# Patient Record
Sex: Female | Born: 1935 | Race: White | Hispanic: No | Smoking: Never smoker
Health system: Southern US, Community
[De-identification: ages and names within clinical notes are randomized; demographics above are authoritative.]

## PROBLEM LIST (undated history)

## (undated) DIAGNOSIS — N189 Chronic kidney disease, unspecified: Secondary | ICD-10-CM

## (undated) DIAGNOSIS — I2699 Other pulmonary embolism without acute cor pulmonale: Secondary | ICD-10-CM

## (undated) DIAGNOSIS — I4891 Unspecified atrial fibrillation: Secondary | ICD-10-CM

## (undated) DIAGNOSIS — E785 Hyperlipidemia, unspecified: Secondary | ICD-10-CM

## (undated) DIAGNOSIS — E119 Type 2 diabetes mellitus without complications: Secondary | ICD-10-CM

## (undated) DIAGNOSIS — I10 Essential (primary) hypertension: Secondary | ICD-10-CM

## (undated) DIAGNOSIS — I82409 Acute embolism and thrombosis of unspecified deep veins of unspecified lower extremity: Secondary | ICD-10-CM

## (undated) DIAGNOSIS — D649 Anemia, unspecified: Secondary | ICD-10-CM

## (undated) HISTORY — DX: Essential (primary) hypertension: I10

## (undated) HISTORY — DX: Type 2 diabetes mellitus without complications: E11.9

## (undated) HISTORY — PX: ABDOMINAL HYSTERECTOMY: SHX81

## (undated) HISTORY — DX: Hyperlipidemia, unspecified: E78.5

## (undated) HISTORY — PX: CHOLECYSTECTOMY: SHX55

## (undated) HISTORY — PX: OTHER SURGICAL HISTORY: SHX169

---

## 2007-10-20 ENCOUNTER — Ambulatory Visit: Payer: Self-pay | Admitting: Cardiology

## 2007-10-23 ENCOUNTER — Ambulatory Visit: Payer: Self-pay | Admitting: Cardiology

## 2007-10-27 ENCOUNTER — Ambulatory Visit: Payer: Self-pay | Admitting: Cardiology

## 2007-11-20 ENCOUNTER — Ambulatory Visit: Payer: Self-pay | Admitting: Cardiology

## 2008-01-12 ENCOUNTER — Encounter (HOSPITAL_COMMUNITY): Admission: RE | Admit: 2008-01-12 | Discharge: 2008-04-11 | Payer: Self-pay | Admitting: Nephrology

## 2008-05-09 ENCOUNTER — Encounter (HOSPITAL_COMMUNITY): Admission: RE | Admit: 2008-05-09 | Discharge: 2008-08-07 | Payer: Self-pay | Admitting: Nephrology

## 2008-08-15 ENCOUNTER — Encounter (HOSPITAL_COMMUNITY): Admission: RE | Admit: 2008-08-15 | Discharge: 2008-09-08 | Payer: Self-pay | Admitting: Nephrology

## 2008-09-09 ENCOUNTER — Encounter (HOSPITAL_COMMUNITY): Admission: RE | Admit: 2008-09-09 | Discharge: 2008-12-08 | Payer: Self-pay | Admitting: Nephrology

## 2008-12-19 ENCOUNTER — Encounter (HOSPITAL_COMMUNITY): Admission: RE | Admit: 2008-12-19 | Discharge: 2009-03-19 | Payer: Self-pay | Admitting: Nephrology

## 2009-03-28 ENCOUNTER — Encounter (HOSPITAL_COMMUNITY): Admission: RE | Admit: 2009-03-28 | Discharge: 2009-06-26 | Payer: Self-pay | Admitting: Nephrology

## 2009-07-06 ENCOUNTER — Encounter (HOSPITAL_COMMUNITY): Admission: RE | Admit: 2009-07-06 | Discharge: 2009-10-04 | Payer: Self-pay | Admitting: Nephrology

## 2009-10-19 ENCOUNTER — Encounter (HOSPITAL_COMMUNITY): Admission: RE | Admit: 2009-10-19 | Discharge: 2010-01-17 | Payer: Self-pay | Admitting: Nephrology

## 2009-12-28 ENCOUNTER — Encounter: Admission: RE | Admit: 2009-12-28 | Discharge: 2010-03-28 | Payer: Self-pay | Admitting: Family Medicine

## 2010-02-01 ENCOUNTER — Encounter (HOSPITAL_COMMUNITY): Admission: RE | Admit: 2010-02-01 | Discharge: 2010-05-02 | Payer: Self-pay | Admitting: Nephrology

## 2010-05-17 ENCOUNTER — Encounter (HOSPITAL_COMMUNITY)
Admission: RE | Admit: 2010-05-17 | Discharge: 2010-08-15 | Payer: Self-pay | Source: Home / Self Care | Admitting: Nephrology

## 2010-08-30 ENCOUNTER — Encounter
Admission: RE | Admit: 2010-08-30 | Discharge: 2010-10-09 | Payer: Self-pay | Source: Home / Self Care | Attending: Nephrology | Admitting: Nephrology

## 2010-09-24 LAB — POCT HEMOGLOBIN-HEMACUE: Hemoglobin: 11.2 g/dL — ABNORMAL LOW (ref 12.0–15.0)

## 2010-10-11 ENCOUNTER — Other Ambulatory Visit: Payer: Self-pay | Admitting: Nephrology

## 2010-10-11 ENCOUNTER — Other Ambulatory Visit: Payer: Self-pay

## 2010-10-11 ENCOUNTER — Ambulatory Visit (HOSPITAL_COMMUNITY): Payer: Medicare Other | Attending: Nephrology

## 2010-10-11 DIAGNOSIS — N184 Chronic kidney disease, stage 4 (severe): Secondary | ICD-10-CM | POA: Insufficient documentation

## 2010-10-11 DIAGNOSIS — D638 Anemia in other chronic diseases classified elsewhere: Secondary | ICD-10-CM | POA: Insufficient documentation

## 2010-10-11 LAB — IRON AND TIBC
Iron: 101 ug/dL (ref 42–135)
Saturation Ratios: 36 % (ref 20–55)
TIBC: 279 ug/dL (ref 250–470)
UIBC: 178 ug/dL

## 2010-10-11 LAB — FERRITIN: Ferritin: 132 ng/mL (ref 10–291)

## 2010-10-12 LAB — POCT HEMOGLOBIN-HEMACUE: Hemoglobin: 12 g/dL (ref 12.0–15.0)

## 2010-11-01 ENCOUNTER — Encounter (HOSPITAL_COMMUNITY)
Admission: RE | Admit: 2010-11-01 | Discharge: 2010-11-01 | Disposition: A | Discharge status: Home / Self Care | Payer: Medicare Other | Source: Ambulatory Visit | Attending: Nephrology | Admitting: Nephrology

## 2010-11-01 ENCOUNTER — Other Ambulatory Visit: Payer: Self-pay

## 2010-11-01 DIAGNOSIS — D638 Anemia in other chronic diseases classified elsewhere: Secondary | ICD-10-CM | POA: Insufficient documentation

## 2010-11-01 DIAGNOSIS — N184 Chronic kidney disease, stage 4 (severe): Secondary | ICD-10-CM | POA: Insufficient documentation

## 2010-11-02 LAB — POCT HEMOGLOBIN-HEMACUE: Hemoglobin: 10.8 g/dL — ABNORMAL LOW (ref 12.0–15.0)

## 2010-11-19 LAB — POCT HEMOGLOBIN-HEMACUE: Hemoglobin: 11.3 g/dL — ABNORMAL LOW (ref 12.0–15.0)

## 2010-11-20 LAB — POCT HEMOGLOBIN-HEMACUE
Hemoglobin: 10.8 g/dL — ABNORMAL LOW (ref 12.0–15.0)
Hemoglobin: 11.3 g/dL — ABNORMAL LOW (ref 12.0–15.0)

## 2010-11-21 LAB — FERRITIN: Ferritin: 132 ng/mL (ref 10–291)

## 2010-11-21 LAB — IRON AND TIBC
Iron: 84 ug/dL (ref 42–135)
Saturation Ratios: 31 % (ref 20–55)
TIBC: 271 ug/dL (ref 250–470)
UIBC: 187 ug/dL

## 2010-11-21 LAB — POCT HEMOGLOBIN-HEMACUE: Hemoglobin: 11.7 g/dL — ABNORMAL LOW (ref 12.0–15.0)

## 2010-11-22 ENCOUNTER — Other Ambulatory Visit: Payer: Self-pay

## 2010-11-22 ENCOUNTER — Encounter (HOSPITAL_COMMUNITY): Payer: Medicare Other | Attending: Nephrology

## 2010-11-22 DIAGNOSIS — D638 Anemia in other chronic diseases classified elsewhere: Secondary | ICD-10-CM | POA: Insufficient documentation

## 2010-11-22 DIAGNOSIS — N184 Chronic kidney disease, stage 4 (severe): Secondary | ICD-10-CM | POA: Insufficient documentation

## 2010-11-22 LAB — BASIC METABOLIC PANEL
BUN: 55 mg/dL — ABNORMAL HIGH (ref 6–23)
CO2: 29 mEq/L (ref 19–32)
Calcium: 9.3 mg/dL (ref 8.4–10.5)
Chloride: 103 mEq/L (ref 96–112)
Creatinine, Ser: 2.86 mg/dL — ABNORMAL HIGH (ref 0.4–1.2)
GFR calc Af Amer: 19 mL/min — ABNORMAL LOW (ref >60)
GFR calc non Af Amer: 16 mL/min — ABNORMAL LOW (ref >60)
Glucose, Bld: 148 mg/dL — ABNORMAL HIGH (ref 70–99)
Potassium: 3.8 mEq/L (ref 3.5–5.1)
Sodium: 139 mEq/L (ref 135–145)

## 2010-11-22 LAB — POCT HEMOGLOBIN-HEMACUE
Hemoglobin: 10.9 g/dL — ABNORMAL LOW (ref 12.0–15.0)
Hemoglobin: 10.9 g/dL — ABNORMAL LOW (ref 12.0–15.0)

## 2010-11-23 LAB — POCT HEMOGLOBIN-HEMACUE
Hemoglobin: 11.4 g/dL — ABNORMAL LOW (ref 12.0–15.0)
Hemoglobin: 10.9 g/dL — ABNORMAL LOW (ref 12.0–15.0)

## 2010-11-24 LAB — POCT HEMOGLOBIN-HEMACUE: Hemoglobin: 11 g/dL — ABNORMAL LOW (ref 12.0–15.0)

## 2010-11-25 LAB — IRON AND TIBC
Iron: 69 ug/dL (ref 42–135)
Iron: 92 ug/dL (ref 42–135)
Saturation Ratios: 30 % (ref 20–55)
Saturation Ratios: 34 % (ref 20–55)
TIBC: 228 ug/dL — ABNORMAL LOW (ref 250–470)
TIBC: 271 ug/dL (ref 250–470)
UIBC: 159 ug/dL
UIBC: 179 ug/dL

## 2010-11-25 LAB — FERRITIN
Ferritin: 115 ng/mL (ref 10–291)
Ferritin: 116 ng/mL (ref 10–291)

## 2010-11-25 LAB — POCT HEMOGLOBIN-HEMACUE
Hemoglobin: 10.7 g/dL — ABNORMAL LOW (ref 12.0–15.0)
Hemoglobin: 11.1 g/dL — ABNORMAL LOW (ref 12.0–15.0)
Hemoglobin: 10.4 g/dL — ABNORMAL LOW (ref 12.0–15.0)

## 2010-11-26 LAB — POCT HEMOGLOBIN-HEMACUE: Hemoglobin: 11.2 g/dL — ABNORMAL LOW (ref 12.0–15.0)

## 2010-11-27 LAB — POCT HEMOGLOBIN-HEMACUE: Hemoglobin: 11.9 g/dL — ABNORMAL LOW (ref 12.0–15.0)

## 2010-11-28 LAB — IRON AND TIBC
Saturation Ratios: 37 % (ref 20–55)
TIBC: 286 ug/dL (ref 250–470)
UIBC: 181 ug/dL

## 2010-11-28 LAB — POCT HEMOGLOBIN-HEMACUE
Hemoglobin: 10.9 g/dL — ABNORMAL LOW (ref 12.0–15.0)
Hemoglobin: 10.5 g/dL — ABNORMAL LOW (ref 12.0–15.0)

## 2010-11-28 LAB — FERRITIN: Ferritin: 172 ng/mL (ref 10–291)

## 2010-12-02 LAB — POCT HEMOGLOBIN-HEMACUE: Hemoglobin: 10.7 g/dL — ABNORMAL LOW (ref 12.0–15.0)

## 2010-12-11 LAB — POCT HEMOGLOBIN-HEMACUE: Hemoglobin: 11 g/dL — ABNORMAL LOW (ref 12.0–15.0)

## 2010-12-12 ENCOUNTER — Encounter (HOSPITAL_COMMUNITY): Payer: Medicare Other | Attending: Nephrology

## 2010-12-12 ENCOUNTER — Other Ambulatory Visit: Payer: Self-pay | Admitting: Nephrology

## 2010-12-12 DIAGNOSIS — D638 Anemia in other chronic diseases classified elsewhere: Secondary | ICD-10-CM | POA: Insufficient documentation

## 2010-12-12 DIAGNOSIS — N184 Chronic kidney disease, stage 4 (severe): Secondary | ICD-10-CM | POA: Insufficient documentation

## 2010-12-13 LAB — POCT HEMOGLOBIN-HEMACUE: Hemoglobin: 11.1 g/dL — ABNORMAL LOW (ref 12.0–15.0)

## 2010-12-13 LAB — IRON AND TIBC
Iron: 138 ug/dL — ABNORMAL HIGH (ref 42–135)
TIBC: 286 ug/dL (ref 250–470)
UIBC: 148 ug/dL

## 2010-12-13 LAB — FERRITIN: Ferritin: 140 ng/mL (ref 10–291)

## 2010-12-15 LAB — POCT HEMOGLOBIN-HEMACUE: Hemoglobin: 12.5 g/dL (ref 12.0–15.0)

## 2010-12-16 LAB — IRON AND TIBC
Iron: 91 ug/dL (ref 42–135)
TIBC: 290 ug/dL (ref 250–470)
UIBC: 199 ug/dL

## 2010-12-16 LAB — FERRITIN: Ferritin: 74 ng/mL (ref 10–291)

## 2010-12-16 LAB — POCT HEMOGLOBIN-HEMACUE: Hemoglobin: 11.5 g/dL — ABNORMAL LOW (ref 12.0–15.0)

## 2010-12-18 LAB — POCT HEMOGLOBIN-HEMACUE
Hemoglobin: 10.8 g/dL — ABNORMAL LOW (ref 12.0–15.0)
Hemoglobin: 12.9 g/dL (ref 12.0–15.0)

## 2010-12-19 LAB — POCT HEMOGLOBIN-HEMACUE: Hemoglobin: 10.7 g/dL — ABNORMAL LOW (ref 12.0–15.0)

## 2010-12-20 LAB — POCT HEMOGLOBIN-HEMACUE
Hemoglobin: 12.1 g/dL (ref 12.0–15.0)
Hemoglobin: 12.1 g/dL (ref 12.0–15.0)

## 2010-12-20 LAB — IRON AND TIBC
Iron: 74 ug/dL (ref 42–135)
Saturation Ratios: 28 % (ref 20–55)

## 2010-12-24 LAB — POCT HEMOGLOBIN-HEMACUE
Hemoglobin: 12.1 g/dL (ref 12.0–15.0)
Hemoglobin: 11.9 g/dL — ABNORMAL LOW (ref 12.0–15.0)

## 2010-12-25 LAB — POCT HEMOGLOBIN-HEMACUE: Hemoglobin: 11.1 g/dL — ABNORMAL LOW (ref 12.0–15.0)

## 2010-12-26 ENCOUNTER — Encounter: Payer: Self-pay | Admitting: Nurse Practitioner

## 2010-12-26 DIAGNOSIS — N189 Chronic kidney disease, unspecified: Secondary | ICD-10-CM

## 2010-12-26 DIAGNOSIS — D649 Anemia, unspecified: Secondary | ICD-10-CM | POA: Insufficient documentation

## 2010-12-26 DIAGNOSIS — M810 Age-related osteoporosis without current pathological fracture: Secondary | ICD-10-CM | POA: Insufficient documentation

## 2010-12-26 DIAGNOSIS — I10 Essential (primary) hypertension: Secondary | ICD-10-CM | POA: Insufficient documentation

## 2010-12-26 DIAGNOSIS — G629 Polyneuropathy, unspecified: Secondary | ICD-10-CM | POA: Insufficient documentation

## 2010-12-26 DIAGNOSIS — E785 Hyperlipidemia, unspecified: Secondary | ICD-10-CM

## 2010-12-26 DIAGNOSIS — I8002 Phlebitis and thrombophlebitis of superficial vessels of left lower extremity: Secondary | ICD-10-CM

## 2010-12-26 DIAGNOSIS — IMO0002 Reserved for concepts with insufficient information to code with codable children: Secondary | ICD-10-CM | POA: Insufficient documentation

## 2010-12-26 DIAGNOSIS — I872 Venous insufficiency (chronic) (peripheral): Secondary | ICD-10-CM | POA: Insufficient documentation

## 2010-12-26 DIAGNOSIS — E119 Type 2 diabetes mellitus without complications: Secondary | ICD-10-CM | POA: Insufficient documentation

## 2010-12-26 DIAGNOSIS — E010 Iodine-deficiency related diffuse (endemic) goiter: Secondary | ICD-10-CM

## 2011-01-02 ENCOUNTER — Other Ambulatory Visit: Payer: Self-pay | Admitting: Nephrology

## 2011-01-02 ENCOUNTER — Encounter (HOSPITAL_COMMUNITY): Payer: Medicare Other

## 2011-01-02 LAB — POCT HEMOGLOBIN-HEMACUE: Hemoglobin: 11.5 g/dL — ABNORMAL LOW (ref 12.0–15.0)

## 2011-01-22 NOTE — Assessment & Plan Note (Signed)
[facility]                            CARDIOLOGY OFFICE NOTE   Julie Carpenter, Julie Carpenter                      MRN:          [id number]  DATE:10/20/2007                            DOB:          [birth year]    CARDIOLOGY CONSULTATION:   Julie Carpenter is here for cardiac evaluation.  She has had two significant  syncopal episodes.  Approximately in September 2008 as she opened her  door to get out of her car, she remembers nothing about the event and  found herself on the ground with a broken bone in her leg.  I do not  know any more specifics about that episode.  Approximately 4 weeks ago  she got out of her car and she had some blurred vision.  She then tried  to rest by leaning over the hood of her car and next found herself on  the ground.  Other than these events she is fully active.  In fact, she  still does some dancing.  She does not have problems with activity.  She  does not have chest pain.  There is no major shortness of breath.  She  does not feel significant palpitations.  With her recent events she has  had an MRI that was done as an outpatient at [facility].  The study was  done on October 05, 2007.  This study was negative.  It was a MR of the  brain for her age.  There have also been two other episodes of syncope.   There is no documented coronary disease.  The patient relates that she  was seen by me and the [facility] office several years ago.  It appeared that  I was involved in her care in 2005.  At the moment the [place] office  cannot find any of these records.  I have found through [facility] that she had a stress Cardiolite in 2005.  At that time her  ejection fraction was normal.  There was a question of slight apical  ischemia but this was not a marked finding.   PAST MEDICAL HISTORY:  Allergies:  PENICILLIN.   Medications:  1. Glipizide 10 mg.  2. Lisinopril 10 mg.  3. Carvedilol 6.25 mg.  4. Actos 45 mg.  5. Furosemide 40 mg.  6. Amitriptyline 50 mg.  7. Vytorin 10/20 mg.  8. Calcium.  9. Aspirin 81 mg.   Other medical problems:  See the list below.   SOCIAL HISTORY:  The patient is widowed.  She is a retired Fut[employer] trader.  She has never smoked.  She says that she walks regularly and does  abdominal crunches and also dances, as I have mentioned.  She has some  arthritis.  She has a history of kidney disease and is followed by Dr.  Briant Cedar.   Otherwise, her review of systems is negative.   FAMILY HISTORY:  Family history reveals that the patient's mother died  at age 78 of a heart attack and father at age 49 of a heart attack.   PHYSICAL EXAM:  Weight today is 172 pounds.  Blood  pressure in the left  arm when starting her exam was 152/91.  We are now checking orthostatic  blood pressures and that data will be added to his note.  Her pulse was  82.  The patient is oriented to person, time and place.  Affect is normal.  HEENT:  No xanthelasma.  She has normal extraocular motion.  There are  no carotid bruits.  There is no jugular venous distention.  LUNGS:  Clear.  The respiratory effort is not labored.  CARDIAC:  An S1 with an S2.  There are no clicks or significant murmurs.  ABDOMEN:  Soft.  There are no masses or bruits.  The patient does have 1+ edema.   EKG reveals normal sinus rhythm with first-degree AV block.  New.   PROBLEMS:  1. History of cholecystectomy and hysterectomy in the past.  2. Diabetes.  3. Chronic kidney disease, which is significant, and she is followed      by Dr. Briant Cedar, who she sees every 3 months.  4. Mild edema that appears to be due to venous insufficiency.  She      says that it goes away at night and comes back during the day when      her feet are down.  5. History of PENICILLIN allergy.  *6.  Recurrent syncope.  The patient's episodes are concerning.  One of  the episodes suggests that she had absolutely no warning.  With the  other episode she had some blurred  vision first.  An MR scan has shown  no marked abnormalities.  It appears that in 2005 there may have been a  slight abnormality on a Cardiolite scan but no marked abnormalities.  At  this point we need to rule out a cardiac basis for these episodes.  We  need to re-document her left ventricular function and valvular function,  and this will be done with a 2-D echo.  She also needs to wear an event  for order to see if we can catch any hint of abnormalities.  I am not  inclined to change her medications at this time.  I have no evidence of  recurrent pulmonary emboli, but this will be kept in mind.  There is no  carotid Doppler data yet.  In this case I believe that this is  appropriate at this time.  I have not arranged for an exercise test.  I  will see her back for early follow-up.  We will also obtain labs done  through [facility] so that we can document if  she has had a TSH and document also what her hemoglobin and renal  function are.  I will see her back for early follow-up.   Her orthostatic blood pressure check is now completed.  With very  careful analysis, the patient's blood pressure remains in the range of  150/90 when lying, sitting and standing and standing for a prolonged  period.  She has no dizziness and there is no marked orthostasis.   See the full discussion above.     Luis Abed, MD, [facility]  Electronically Signed    JDK/MedQ  DD: 10/20/2007  DT: 10/21/2007  Job #: [id number]   cc:   Lindaann Pascal, PA-C

## 2011-01-22 NOTE — Assessment & Plan Note (Signed)
[facility]                          [place] CARDIOLOGY OFFICE NOTE   Julie, Carpenter                      MRN:          [id number]  DATE:11/20/2007                            DOB:          [birth year]    HISTORY:  Julie Carpenter is seen in followup.  I saw her on October 20, 2007.  At that time, she had given a history of 2 syncopal episodes.  I  was quite concerned about this.  However since that time, she has been  quite stable.  She has had no recurring episodes.  We did decide to have  her wear an event recorder.  In fact, she had sinus rhythm for the  entire prolonged period.  She did not have any atrial fibrillation.  There were 3 beats of supraventricular tachycardia.  There were no  prolonged pauses.  There was no ventricular tachycardia.  The patient  also had a 2-D echo.  This study shows that she has a normal ejection  fraction.  The EF is in the range of 55-60%.  There was mild left  ventricular hypertrophy.  There was mild mitral regurgitation.  The  patient also had carotid Dopplers done October 27, 2007.  There was no  significant stenoses.  Today, she is feeling well.  She has not had  chest pain, shortness of breath or syncope.  I have given her all the  information and she is pleased with the results.   ALLERGIES:  PENICILLIN.   MEDICATIONS:  1. Glipizide.  2. Lisinopril.  3. Carvedilol.  4. Actos.  5. Furosemide.  6. Amitriptyline.  7. Vytorin.  8. Calcium.  9. Aspirin.   OTHER MEDICAL PROBLEMS:  See the list below.   REVIEW OF SYSTEMS:  She is feeling well today.   REVIEW OF SYSTEMS:  Otherwise is negative.   PHYSICAL EXAMINATION:  VITAL SIGNS:  Blood pressure today is 140/85 with  a pulse of 73.  GENERAL:  The patient is oriented to person, time and place.  Affect is  normal.  HEENT:  Reveals no xanthelasma.  She has normal extraocular  motion.  NECK:  There are no carotid bruits.  There is no jugular venous  distention.  LUNGS:  Clear.  Respiratory effort is not labored.  CARDIAC:  Reveals S1-S2.  There are no clicks or significant murmurs.  ABDOMEN:  Soft.  EXTREMITIES:  She has trace peripheral edema.   LABORATORY DATA:  Discussed above.   PROBLEMS:  1. History of cholecystectomy and hysterectomy.  2. Diabetes.  3. Chronic kidney disease followed by Dr. Fidela Salisbury.  4. Mild edema that is probably due to venous insufficiency.  5. History of penicillin allergy.  6. *Syncope on 2 occasions.  Her cardiac workup has been negative.      She does not need a follow up exercise test.  I would recommend no      further workup.  Careful watching will be in order.  I will see her      back for cardiology followup in 1 year.     Julie Carpenter.  Julie Ser, MD, [facility]  Electronically Signed    JDK/MedQ  DD: 11/20/2007  DT: 11/21/2007  Job #: [id number]   cc:   Lindaann Pascal, [facility]  Dyke Maes, M.D.

## 2011-01-23 ENCOUNTER — Other Ambulatory Visit: Payer: Self-pay | Admitting: Nephrology

## 2011-01-23 ENCOUNTER — Encounter (HOSPITAL_COMMUNITY): Payer: Medicare Other | Attending: Nephrology

## 2011-01-23 DIAGNOSIS — D638 Anemia in other chronic diseases classified elsewhere: Secondary | ICD-10-CM | POA: Insufficient documentation

## 2011-01-23 DIAGNOSIS — N184 Chronic kidney disease, stage 4 (severe): Secondary | ICD-10-CM | POA: Insufficient documentation

## 2011-02-13 ENCOUNTER — Other Ambulatory Visit: Payer: Self-pay | Admitting: Nephrology

## 2011-02-13 ENCOUNTER — Encounter (HOSPITAL_COMMUNITY): Payer: Medicare Other | Attending: Nephrology

## 2011-02-13 DIAGNOSIS — D638 Anemia in other chronic diseases classified elsewhere: Secondary | ICD-10-CM | POA: Insufficient documentation

## 2011-02-13 DIAGNOSIS — N184 Chronic kidney disease, stage 4 (severe): Secondary | ICD-10-CM | POA: Insufficient documentation

## 2011-02-13 LAB — FERRITIN: Ferritin: 138 ng/mL (ref 10–291)

## 2011-03-06 ENCOUNTER — Other Ambulatory Visit: Payer: Self-pay | Admitting: Nephrology

## 2011-03-06 ENCOUNTER — Encounter (HOSPITAL_COMMUNITY): Payer: Medicare Other

## 2011-03-06 LAB — POCT HEMOGLOBIN-HEMACUE: Hemoglobin: 11.6 g/dL — ABNORMAL LOW (ref 12.0–15.0)

## 2011-03-27 ENCOUNTER — Other Ambulatory Visit: Payer: Self-pay | Admitting: Nephrology

## 2011-03-27 ENCOUNTER — Encounter (HOSPITAL_COMMUNITY): Payer: Medicare Other | Attending: Nephrology

## 2011-03-27 DIAGNOSIS — N184 Chronic kidney disease, stage 4 (severe): Secondary | ICD-10-CM | POA: Insufficient documentation

## 2011-03-27 DIAGNOSIS — D638 Anemia in other chronic diseases classified elsewhere: Secondary | ICD-10-CM | POA: Insufficient documentation

## 2011-04-17 ENCOUNTER — Other Ambulatory Visit: Payer: Self-pay | Admitting: Nephrology

## 2011-04-17 ENCOUNTER — Encounter (HOSPITAL_COMMUNITY): Payer: Medicare Other | Attending: Nephrology

## 2011-04-17 DIAGNOSIS — N184 Chronic kidney disease, stage 4 (severe): Secondary | ICD-10-CM | POA: Insufficient documentation

## 2011-04-17 DIAGNOSIS — D638 Anemia in other chronic diseases classified elsewhere: Secondary | ICD-10-CM | POA: Insufficient documentation

## 2011-04-17 LAB — POCT HEMOGLOBIN-HEMACUE: Hemoglobin: 10.6 g/dL — ABNORMAL LOW (ref 12.0–15.0)

## 2011-05-08 ENCOUNTER — Other Ambulatory Visit: Payer: Self-pay | Admitting: Nephrology

## 2011-05-08 ENCOUNTER — Encounter (HOSPITAL_COMMUNITY): Payer: Medicare Other

## 2011-05-08 LAB — POCT HEMOGLOBIN-HEMACUE: Hemoglobin: 9.7 g/dL — ABNORMAL LOW (ref 12.0–15.0)

## 2011-05-29 ENCOUNTER — Other Ambulatory Visit: Payer: Self-pay | Admitting: Nephrology

## 2011-05-29 ENCOUNTER — Encounter (HOSPITAL_COMMUNITY)
Admission: RE | Admit: 2011-05-29 | Discharge: 2011-05-29 | Disposition: A | Discharge status: Home / Self Care | Payer: Medicare Other | Source: Ambulatory Visit | Attending: Nephrology | Admitting: Nephrology

## 2011-05-29 DIAGNOSIS — D638 Anemia in other chronic diseases classified elsewhere: Secondary | ICD-10-CM | POA: Insufficient documentation

## 2011-05-29 DIAGNOSIS — N184 Chronic kidney disease, stage 4 (severe): Secondary | ICD-10-CM | POA: Insufficient documentation

## 2011-05-29 LAB — IRON AND TIBC
Saturation Ratios: 35 % (ref 20–55)
TIBC: 274 ug/dL (ref 250–470)
UIBC: 178 ug/dL (ref 125–400)

## 2011-05-29 LAB — FERRITIN: Ferritin: 113 ng/mL (ref 10–291)

## 2011-06-10 LAB — POCT HEMOGLOBIN-HEMACUE
Hemoglobin: 9 — ABNORMAL LOW
Hemoglobin: 10.1 — ABNORMAL LOW
Hemoglobin: 8.7 — ABNORMAL LOW

## 2011-06-11 LAB — POCT HEMOGLOBIN-HEMACUE: Hemoglobin: 10.6 — ABNORMAL LOW

## 2011-06-12 LAB — POCT HEMOGLOBIN-HEMACUE: Hemoglobin: 9 — ABNORMAL LOW

## 2011-06-14 LAB — IRON AND TIBC
Saturation Ratios: 33 % (ref 20–55)
TIBC: 283 ug/dL (ref 250–470)
UIBC: 190 ug/dL

## 2011-06-14 LAB — POCT HEMOGLOBIN-HEMACUE: Hemoglobin: 11 g/dL — ABNORMAL LOW (ref 12.0–15.0)

## 2011-06-19 ENCOUNTER — Encounter (HOSPITAL_COMMUNITY)
Admission: RE | Admit: 2011-06-19 | Discharge: 2011-06-19 | Disposition: A | Discharge status: Home / Self Care | Payer: Medicare Other | Source: Ambulatory Visit | Attending: Nephrology | Admitting: Nephrology

## 2011-06-19 ENCOUNTER — Other Ambulatory Visit: Payer: Self-pay | Admitting: Nephrology

## 2011-06-19 DIAGNOSIS — N184 Chronic kidney disease, stage 4 (severe): Secondary | ICD-10-CM | POA: Insufficient documentation

## 2011-06-19 DIAGNOSIS — D638 Anemia in other chronic diseases classified elsewhere: Secondary | ICD-10-CM | POA: Insufficient documentation

## 2011-07-10 ENCOUNTER — Other Ambulatory Visit: Payer: Self-pay | Admitting: Nephrology

## 2011-07-10 ENCOUNTER — Encounter (HOSPITAL_COMMUNITY): Payer: Medicare Other | Attending: Nephrology

## 2011-07-10 ENCOUNTER — Encounter (HOSPITAL_COMMUNITY): Payer: Medicare Other

## 2011-07-10 DIAGNOSIS — D638 Anemia in other chronic diseases classified elsewhere: Secondary | ICD-10-CM | POA: Insufficient documentation

## 2011-07-10 DIAGNOSIS — N184 Chronic kidney disease, stage 4 (severe): Secondary | ICD-10-CM | POA: Insufficient documentation

## 2011-07-29 ENCOUNTER — Other Ambulatory Visit (HOSPITAL_COMMUNITY): Payer: Self-pay | Admitting: *Deleted

## 2011-07-31 ENCOUNTER — Encounter (HOSPITAL_COMMUNITY)
Admission: RE | Admit: 2011-07-31 | Discharge: 2011-07-31 | Disposition: A | Discharge status: Home / Self Care | Payer: Medicare Other | Source: Ambulatory Visit | Attending: Nephrology | Admitting: Nephrology

## 2011-07-31 DIAGNOSIS — D638 Anemia in other chronic diseases classified elsewhere: Secondary | ICD-10-CM | POA: Insufficient documentation

## 2011-07-31 DIAGNOSIS — N184 Chronic kidney disease, stage 4 (severe): Secondary | ICD-10-CM | POA: Insufficient documentation

## 2011-07-31 MED ORDER — EPOETIN ALFA 10000 UNIT/ML IJ SOLN
INTRAMUSCULAR | Status: AC
  Filled 2011-07-31: qty 1

## 2011-07-31 MED ORDER — EPOETIN ALFA 10000 UNIT/ML IJ SOLN
30000.0000 [IU] | INTRAMUSCULAR | Status: AC
  Administered 2011-07-31: 30000 [IU] via SUBCUTANEOUS

## 2011-07-31 MED ORDER — EPOETIN ALFA 20000 UNIT/ML IJ SOLN
INTRAMUSCULAR | Status: AC
  Filled 2011-07-31: qty 1

## 2011-08-09 ENCOUNTER — Other Ambulatory Visit: Payer: Self-pay | Admitting: Nephrology

## 2011-08-20 ENCOUNTER — Other Ambulatory Visit (HOSPITAL_COMMUNITY): Payer: Self-pay | Admitting: *Deleted

## 2011-08-22 ENCOUNTER — Encounter (HOSPITAL_COMMUNITY): Payer: Medicare Other

## 2011-08-26 ENCOUNTER — Encounter (HOSPITAL_COMMUNITY)
Admission: RE | Admit: 2011-08-26 | Discharge: 2011-08-26 | Disposition: A | Discharge status: Home / Self Care | Payer: Medicare Other | Source: Ambulatory Visit | Attending: Nephrology | Admitting: Nephrology

## 2011-08-26 DIAGNOSIS — N184 Chronic kidney disease, stage 4 (severe): Secondary | ICD-10-CM | POA: Insufficient documentation

## 2011-08-26 DIAGNOSIS — D638 Anemia in other chronic diseases classified elsewhere: Secondary | ICD-10-CM | POA: Insufficient documentation

## 2011-08-26 LAB — IRON AND TIBC
Iron: 82 ug/dL (ref 42–135)
TIBC: 274 ug/dL (ref 250–470)
UIBC: 192 ug/dL (ref 125–400)

## 2011-08-26 LAB — FERRITIN: Ferritin: 103 ng/mL (ref 10–291)

## 2011-08-26 MED ORDER — EPOETIN ALFA 10000 UNIT/ML IJ SOLN: 20000.0000 [IU] | INTRAMUSCULAR | Status: DC

## 2011-08-26 MED ORDER — EPOETIN ALFA 20000 UNIT/ML IJ SOLN
INTRAMUSCULAR | Status: AC
  Administered 2011-08-26: 20000 [IU] via SUBCUTANEOUS
  Filled 2011-08-26: qty 1

## 2011-09-12 ENCOUNTER — Encounter (HOSPITAL_COMMUNITY)
Admission: RE | Admit: 2011-09-12 | Discharge: 2011-09-12 | Disposition: A | Discharge status: Home / Self Care | Payer: Medicare Other | Source: Ambulatory Visit | Attending: Nephrology | Admitting: Nephrology

## 2011-09-12 DIAGNOSIS — N184 Chronic kidney disease, stage 4 (severe): Secondary | ICD-10-CM | POA: Insufficient documentation

## 2011-09-12 DIAGNOSIS — D638 Anemia in other chronic diseases classified elsewhere: Secondary | ICD-10-CM | POA: Insufficient documentation

## 2011-09-12 LAB — POCT HEMOGLOBIN-HEMACUE: Hemoglobin: 9.9 g/dL — ABNORMAL LOW (ref 12.0–15.0)

## 2011-09-12 MED ORDER — EPOETIN ALFA 20000 UNIT/ML IJ SOLN
INTRAMUSCULAR | Status: AC
  Administered 2011-09-12: 20000 [IU] via SUBCUTANEOUS
  Filled 2011-09-12: qty 1

## 2011-09-12 MED ORDER — EPOETIN ALFA 10000 UNIT/ML IJ SOLN: 20000.0000 [IU] | INTRAMUSCULAR | Status: DC

## 2011-09-26 ENCOUNTER — Encounter (HOSPITAL_COMMUNITY)
Admission: RE | Admit: 2011-09-26 | Discharge: 2011-09-26 | Disposition: A | Discharge status: Home / Self Care | Payer: Medicare Other | Source: Ambulatory Visit | Attending: Nephrology | Admitting: Nephrology

## 2011-09-26 LAB — FERRITIN: Ferritin: 72 ng/mL (ref 10–291)

## 2011-09-26 LAB — IRON AND TIBC
Iron: 58 ug/dL (ref 42–135)
TIBC: 272 ug/dL (ref 250–470)
UIBC: 214 ug/dL (ref 125–400)

## 2011-09-26 MED ORDER — EPOETIN ALFA 20000 UNIT/ML IJ SOLN
INTRAMUSCULAR | Status: AC
  Administered 2011-09-26: 20000 [IU] via SUBCUTANEOUS
  Filled 2011-09-26: qty 1

## 2011-09-26 MED ORDER — EPOETIN ALFA 10000 UNIT/ML IJ SOLN: 20000.0000 [IU] | INTRAMUSCULAR | Status: DC

## 2011-10-10 ENCOUNTER — Encounter (HOSPITAL_COMMUNITY)
Admission: RE | Admit: 2011-10-10 | Discharge: 2011-10-10 | Disposition: A | Discharge status: Home / Self Care | Payer: Medicare Other | Source: Ambulatory Visit | Attending: Nephrology | Admitting: Nephrology

## 2011-10-10 MED ORDER — EPOETIN ALFA 10000 UNIT/ML IJ SOLN: 20000.0000 [IU] | INTRAMUSCULAR | Status: DC

## 2011-10-10 MED ORDER — EPOETIN ALFA 20000 UNIT/ML IJ SOLN
INTRAMUSCULAR | Status: AC
  Administered 2011-10-10: 20000 [IU] via SUBCUTANEOUS
  Filled 2011-10-10: qty 1

## 2011-10-24 ENCOUNTER — Encounter (HOSPITAL_COMMUNITY)
Admission: RE | Admit: 2011-10-24 | Discharge: 2011-10-24 | Disposition: A | Discharge status: Home / Self Care | Payer: Medicare Other | Source: Ambulatory Visit | Attending: Nephrology | Admitting: Nephrology

## 2011-10-24 DIAGNOSIS — D638 Anemia in other chronic diseases classified elsewhere: Secondary | ICD-10-CM | POA: Insufficient documentation

## 2011-10-24 DIAGNOSIS — N184 Chronic kidney disease, stage 4 (severe): Secondary | ICD-10-CM | POA: Insufficient documentation

## 2011-10-24 LAB — POCT HEMOGLOBIN-HEMACUE: Hemoglobin: 10.6 g/dL — ABNORMAL LOW (ref 12.0–15.0)

## 2011-10-24 MED ORDER — EPOETIN ALFA 10000 UNIT/ML IJ SOLN: 20000.0000 [IU] | INTRAMUSCULAR | Status: DC

## 2011-10-24 MED ORDER — EPOETIN ALFA 20000 UNIT/ML IJ SOLN
INTRAMUSCULAR | Status: AC
  Administered 2011-10-24: 20000 [IU] via SUBCUTANEOUS
  Filled 2011-10-24: qty 1

## 2011-11-07 ENCOUNTER — Encounter (HOSPITAL_COMMUNITY)
Admission: RE | Admit: 2011-11-07 | Discharge: 2011-11-07 | Disposition: A | Discharge status: Home / Self Care | Payer: Medicare Other | Source: Ambulatory Visit | Attending: Nephrology | Admitting: Nephrology

## 2011-11-07 LAB — POCT HEMOGLOBIN-HEMACUE: Hemoglobin: 10.8 g/dL — ABNORMAL LOW (ref 12.0–15.0)

## 2011-11-07 MED ORDER — EPOETIN ALFA 20000 UNIT/ML IJ SOLN
INTRAMUSCULAR | Status: AC
  Administered 2011-11-07: 20000 [IU] via SUBCUTANEOUS
  Filled 2011-11-07: qty 1

## 2011-11-07 MED ORDER — EPOETIN ALFA 10000 UNIT/ML IJ SOLN: 20000.0000 [IU] | INTRAMUSCULAR | Status: DC

## 2011-11-21 ENCOUNTER — Encounter (HOSPITAL_COMMUNITY)
Admission: RE | Admit: 2011-11-21 | Discharge: 2011-11-21 | Disposition: A | Discharge status: Home / Self Care | Payer: Medicare Other | Source: Ambulatory Visit | Attending: Nephrology | Admitting: Nephrology

## 2011-11-21 DIAGNOSIS — N184 Chronic kidney disease, stage 4 (severe): Secondary | ICD-10-CM | POA: Insufficient documentation

## 2011-11-21 DIAGNOSIS — D638 Anemia in other chronic diseases classified elsewhere: Secondary | ICD-10-CM | POA: Insufficient documentation

## 2011-11-21 MED ORDER — EPOETIN ALFA 10000 UNIT/ML IJ SOLN: 20000.0000 [IU] | INTRAMUSCULAR | Status: DC

## 2011-11-21 MED ORDER — EPOETIN ALFA 20000 UNIT/ML IJ SOLN
INTRAMUSCULAR | Status: AC
  Administered 2011-11-21: 20000 [IU] via SUBCUTANEOUS
  Filled 2011-11-21: qty 1

## 2011-12-02 ENCOUNTER — Other Ambulatory Visit (HOSPITAL_COMMUNITY): Payer: Self-pay | Admitting: *Deleted

## 2011-12-05 ENCOUNTER — Encounter (HOSPITAL_COMMUNITY)
Admission: RE | Admit: 2011-12-05 | Discharge: 2011-12-05 | Disposition: A | Discharge status: Home / Self Care | Payer: Medicare Other | Source: Ambulatory Visit | Attending: Nephrology | Admitting: Nephrology

## 2011-12-05 LAB — POCT HEMOGLOBIN-HEMACUE: Hemoglobin: 11.9 g/dL — ABNORMAL LOW (ref 12.0–15.0)

## 2011-12-05 MED ORDER — EPOETIN ALFA 10000 UNIT/ML IJ SOLN: 20000.0000 [IU] | INTRAMUSCULAR | Status: DC

## 2011-12-05 MED ORDER — EPOETIN ALFA 20000 UNIT/ML IJ SOLN
INTRAMUSCULAR | Status: AC
  Administered 2011-12-05: 20000 [IU] via SUBCUTANEOUS
  Filled 2011-12-05: qty 1

## 2011-12-19 ENCOUNTER — Encounter (HOSPITAL_COMMUNITY)
Admission: RE | Admit: 2011-12-19 | Discharge: 2011-12-19 | Disposition: A | Discharge status: Home / Self Care | Payer: Medicare Other | Source: Ambulatory Visit | Attending: Nephrology | Admitting: Nephrology

## 2011-12-19 DIAGNOSIS — N184 Chronic kidney disease, stage 4 (severe): Secondary | ICD-10-CM | POA: Insufficient documentation

## 2011-12-19 DIAGNOSIS — D638 Anemia in other chronic diseases classified elsewhere: Secondary | ICD-10-CM | POA: Insufficient documentation

## 2011-12-19 LAB — IRON AND TIBC
Iron: 76 ug/dL (ref 42–135)
TIBC: 246 ug/dL — ABNORMAL LOW (ref 250–470)

## 2011-12-19 LAB — FERRITIN: Ferritin: 73 ng/mL (ref 10–291)

## 2011-12-19 MED ORDER — EPOETIN ALFA 10000 UNIT/ML IJ SOLN: 20000.0000 [IU] | INTRAMUSCULAR | Status: DC

## 2012-01-02 ENCOUNTER — Encounter (HOSPITAL_COMMUNITY)
Admission: RE | Admit: 2012-01-02 | Discharge: 2012-01-02 | Disposition: A | Discharge status: Home / Self Care | Payer: Medicare Other | Source: Ambulatory Visit | Attending: Nephrology | Admitting: Nephrology

## 2012-01-02 MED ORDER — EPOETIN ALFA 10000 UNIT/ML IJ SOLN: 20000.0000 [IU] | INTRAMUSCULAR | Status: DC

## 2012-01-16 ENCOUNTER — Encounter (HOSPITAL_COMMUNITY)
Admission: RE | Admit: 2012-01-16 | Discharge: 2012-01-16 | Disposition: A | Discharge status: Home / Self Care | Payer: Medicare Other | Source: Ambulatory Visit | Attending: Nephrology | Admitting: Nephrology

## 2012-01-16 DIAGNOSIS — D638 Anemia in other chronic diseases classified elsewhere: Secondary | ICD-10-CM | POA: Insufficient documentation

## 2012-01-16 DIAGNOSIS — N184 Chronic kidney disease, stage 4 (severe): Secondary | ICD-10-CM | POA: Insufficient documentation

## 2012-01-16 LAB — POCT HEMOGLOBIN-HEMACUE: Hemoglobin: 11 g/dL — ABNORMAL LOW (ref 12.0–15.0)

## 2012-01-16 MED ORDER — EPOETIN ALFA 20000 UNIT/ML IJ SOLN
INTRAMUSCULAR | Status: AC
  Administered 2012-01-16: 20000 [IU] via SUBCUTANEOUS
  Filled 2012-01-16: qty 1

## 2012-01-16 MED ORDER — EPOETIN ALFA 10000 UNIT/ML IJ SOLN: 20000.0000 [IU] | INTRAMUSCULAR | Status: DC

## 2012-01-30 ENCOUNTER — Encounter (HOSPITAL_COMMUNITY)
Admission: RE | Admit: 2012-01-30 | Discharge: 2012-01-30 | Disposition: A | Discharge status: Home / Self Care | Payer: Medicare Other | Source: Ambulatory Visit | Attending: Nephrology | Admitting: Nephrology

## 2012-01-30 LAB — POCT HEMOGLOBIN-HEMACUE: Hemoglobin: 11 g/dL — ABNORMAL LOW (ref 12.0–15.0)

## 2012-01-30 MED ORDER — EPOETIN ALFA 20000 UNIT/ML IJ SOLN
INTRAMUSCULAR | Status: AC
  Administered 2012-01-30: 20000 [IU] via SUBCUTANEOUS
  Filled 2012-01-30: qty 1

## 2012-01-30 MED ORDER — EPOETIN ALFA 10000 UNIT/ML IJ SOLN: 20000.0000 [IU] | INTRAMUSCULAR | Status: DC

## 2012-02-13 ENCOUNTER — Encounter (HOSPITAL_COMMUNITY)
Admission: RE | Admit: 2012-02-13 | Discharge: 2012-02-13 | Disposition: A | Discharge status: Home / Self Care | Payer: Medicare Other | Source: Ambulatory Visit | Attending: Nephrology | Admitting: Nephrology

## 2012-02-13 DIAGNOSIS — N184 Chronic kidney disease, stage 4 (severe): Secondary | ICD-10-CM | POA: Insufficient documentation

## 2012-02-13 DIAGNOSIS — D638 Anemia in other chronic diseases classified elsewhere: Secondary | ICD-10-CM | POA: Insufficient documentation

## 2012-02-13 MED ORDER — EPOETIN ALFA 10000 UNIT/ML IJ SOLN: 20000.0000 [IU] | INTRAMUSCULAR | Status: DC

## 2012-02-21 ENCOUNTER — Other Ambulatory Visit (HOSPITAL_COMMUNITY): Payer: Self-pay | Admitting: *Deleted

## 2012-02-27 ENCOUNTER — Encounter (HOSPITAL_COMMUNITY)
Admission: RE | Admit: 2012-02-27 | Discharge: 2012-02-27 | Disposition: A | Discharge status: Home / Self Care | Payer: Medicare Other | Source: Ambulatory Visit | Attending: Nephrology | Admitting: Nephrology

## 2012-02-27 MED ORDER — EPOETIN ALFA 10000 UNIT/ML IJ SOLN: 20000.0000 [IU] | INTRAMUSCULAR | Status: DC

## 2012-02-27 MED ORDER — EPOETIN ALFA 20000 UNIT/ML IJ SOLN
INTRAMUSCULAR | Status: AC
  Administered 2012-02-27: 20000 [IU] via SUBCUTANEOUS
  Filled 2012-02-27: qty 1

## 2012-03-19 ENCOUNTER — Encounter (HOSPITAL_COMMUNITY)
Admission: RE | Admit: 2012-03-19 | Discharge: 2012-03-19 | Disposition: A | Discharge status: Home / Self Care | Payer: Medicare Other | Source: Ambulatory Visit | Attending: Nephrology | Admitting: Nephrology

## 2012-03-19 DIAGNOSIS — N184 Chronic kidney disease, stage 4 (severe): Secondary | ICD-10-CM | POA: Insufficient documentation

## 2012-03-19 DIAGNOSIS — D638 Anemia in other chronic diseases classified elsewhere: Secondary | ICD-10-CM | POA: Insufficient documentation

## 2012-03-19 LAB — IRON AND TIBC
Iron: 74 ug/dL (ref 42–135)
TIBC: 228 ug/dL — ABNORMAL LOW (ref 250–470)

## 2012-03-19 LAB — FERRITIN: Ferritin: 122 ng/mL (ref 10–291)

## 2012-03-19 MED ORDER — EPOETIN ALFA 10000 UNIT/ML IJ SOLN: 20000.0000 [IU] | INTRAMUSCULAR | Status: DC

## 2012-03-19 MED ORDER — EPOETIN ALFA 20000 UNIT/ML IJ SOLN
INTRAMUSCULAR | Status: AC
  Administered 2012-03-19: 20000 [IU] via SUBCUTANEOUS
  Filled 2012-03-19: qty 1

## 2012-04-09 ENCOUNTER — Encounter (HOSPITAL_COMMUNITY)
Admission: RE | Admit: 2012-04-09 | Discharge: 2012-04-09 | Disposition: A | Discharge status: Home / Self Care | Payer: Medicare Other | Source: Ambulatory Visit | Attending: Nephrology | Admitting: Nephrology

## 2012-04-09 DIAGNOSIS — N184 Chronic kidney disease, stage 4 (severe): Secondary | ICD-10-CM | POA: Insufficient documentation

## 2012-04-09 DIAGNOSIS — D638 Anemia in other chronic diseases classified elsewhere: Secondary | ICD-10-CM | POA: Insufficient documentation

## 2012-04-09 MED ORDER — EPOETIN ALFA 10000 UNIT/ML IJ SOLN: 20000.0000 [IU] | INTRAMUSCULAR | Status: DC

## 2012-04-23 ENCOUNTER — Encounter (HOSPITAL_COMMUNITY)
Admission: RE | Admit: 2012-04-23 | Discharge: 2012-04-23 | Disposition: A | Discharge status: Home / Self Care | Payer: Medicare Other | Source: Ambulatory Visit | Attending: Nephrology | Admitting: Nephrology

## 2012-04-23 LAB — POCT HEMOGLOBIN-HEMACUE: Hemoglobin: 11 g/dL — ABNORMAL LOW (ref 12.0–15.0)

## 2012-04-23 MED ORDER — EPOETIN ALFA 20000 UNIT/ML IJ SOLN
INTRAMUSCULAR | Status: AC
  Administered 2012-04-23: 20000 [IU] via SUBCUTANEOUS
  Filled 2012-04-23: qty 1

## 2012-04-23 MED ORDER — EPOETIN ALFA 10000 UNIT/ML IJ SOLN: 20000.0000 [IU] | INTRAMUSCULAR | Status: DC

## 2012-05-14 ENCOUNTER — Encounter (HOSPITAL_COMMUNITY)
Admission: RE | Admit: 2012-05-14 | Discharge: 2012-05-14 | Disposition: A | Discharge status: Home / Self Care | Payer: Medicare Other | Source: Ambulatory Visit | Attending: Nephrology | Admitting: Nephrology

## 2012-05-14 DIAGNOSIS — N184 Chronic kidney disease, stage 4 (severe): Secondary | ICD-10-CM | POA: Insufficient documentation

## 2012-05-14 DIAGNOSIS — D638 Anemia in other chronic diseases classified elsewhere: Secondary | ICD-10-CM | POA: Insufficient documentation

## 2012-05-14 MED ORDER — EPOETIN ALFA 20000 UNIT/ML IJ SOLN
INTRAMUSCULAR | Status: AC
  Administered 2012-05-14: 20000 [IU] via SUBCUTANEOUS
  Filled 2012-05-14: qty 1

## 2012-05-14 MED ORDER — EPOETIN ALFA 10000 UNIT/ML IJ SOLN: 20000.0000 [IU] | INTRAMUSCULAR | Status: DC

## 2012-06-04 ENCOUNTER — Other Ambulatory Visit (HOSPITAL_COMMUNITY): Payer: Self-pay | Admitting: *Deleted

## 2012-06-04 ENCOUNTER — Encounter (HOSPITAL_COMMUNITY)
Admission: RE | Admit: 2012-06-04 | Discharge: 2012-06-04 | Disposition: A | Discharge status: Home / Self Care | Payer: Medicare Other | Source: Ambulatory Visit | Attending: Nephrology | Admitting: Nephrology

## 2012-06-04 LAB — POCT HEMOGLOBIN-HEMACUE: Hemoglobin: 11.7 g/dL — ABNORMAL LOW (ref 12.0–15.0)

## 2012-06-04 MED ORDER — EPOETIN ALFA 10000 UNIT/ML IJ SOLN: 20000.0000 [IU] | INTRAMUSCULAR | Status: DC

## 2012-06-04 MED ORDER — EPOETIN ALFA 20000 UNIT/ML IJ SOLN
INTRAMUSCULAR | Status: AC
  Administered 2012-06-04: 20000 [IU] via SUBCUTANEOUS
  Filled 2012-06-04: qty 1

## 2012-06-25 ENCOUNTER — Encounter (HOSPITAL_COMMUNITY)
Admission: RE | Admit: 2012-06-25 | Discharge: 2012-06-25 | Disposition: A | Discharge status: Home / Self Care | Payer: Medicare Other | Source: Ambulatory Visit | Attending: Nephrology | Admitting: Nephrology

## 2012-06-25 DIAGNOSIS — N184 Chronic kidney disease, stage 4 (severe): Secondary | ICD-10-CM | POA: Insufficient documentation

## 2012-06-25 DIAGNOSIS — D638 Anemia in other chronic diseases classified elsewhere: Secondary | ICD-10-CM | POA: Insufficient documentation

## 2012-06-25 LAB — POCT HEMOGLOBIN-HEMACUE: Hemoglobin: 11.7 g/dL — ABNORMAL LOW (ref 12.0–15.0)

## 2012-06-25 MED ORDER — EPOETIN ALFA 20000 UNIT/ML IJ SOLN
INTRAMUSCULAR | Status: AC
  Administered 2012-06-25: 20000 [IU]
  Filled 2012-06-25: qty 1

## 2012-06-25 MED ORDER — EPOETIN ALFA 10000 UNIT/ML IJ SOLN: 20000.0000 [IU] | INTRAMUSCULAR | Status: DC

## 2012-07-16 ENCOUNTER — Encounter (HOSPITAL_COMMUNITY)
Admission: RE | Admit: 2012-07-16 | Discharge: 2012-07-16 | Disposition: A | Discharge status: Home / Self Care | Payer: Medicare Other | Source: Ambulatory Visit | Attending: Nephrology | Admitting: Nephrology

## 2012-07-16 DIAGNOSIS — N184 Chronic kidney disease, stage 4 (severe): Secondary | ICD-10-CM | POA: Insufficient documentation

## 2012-07-16 DIAGNOSIS — D638 Anemia in other chronic diseases classified elsewhere: Secondary | ICD-10-CM | POA: Insufficient documentation

## 2012-07-16 MED ORDER — EPOETIN ALFA 10000 UNIT/ML IJ SOLN: 20000.0000 [IU] | INTRAMUSCULAR | Status: DC

## 2012-07-16 MED ORDER — EPOETIN ALFA 20000 UNIT/ML IJ SOLN
INTRAMUSCULAR | Status: AC
  Administered 2012-07-16: 20000 [IU] via SUBCUTANEOUS
  Filled 2012-07-16: qty 1

## 2012-08-05 ENCOUNTER — Encounter (HOSPITAL_COMMUNITY)
Admission: RE | Admit: 2012-08-05 | Discharge: 2012-08-05 | Disposition: A | Discharge status: Home / Self Care | Payer: Medicare Other | Source: Ambulatory Visit | Attending: Nephrology | Admitting: Nephrology

## 2012-08-05 MED ORDER — EPOETIN ALFA 10000 UNIT/ML IJ SOLN: 20000.0000 [IU] | INTRAMUSCULAR | Status: DC

## 2012-08-05 MED ORDER — EPOETIN ALFA 20000 UNIT/ML IJ SOLN
INTRAMUSCULAR | Status: AC
  Administered 2012-08-05: 20000 [IU] via SUBCUTANEOUS
  Filled 2012-08-05: qty 1

## 2012-08-25 ENCOUNTER — Other Ambulatory Visit (HOSPITAL_COMMUNITY): Payer: Self-pay | Admitting: *Deleted

## 2012-08-26 ENCOUNTER — Encounter (HOSPITAL_COMMUNITY): Payer: Medicare Other

## 2012-09-03 ENCOUNTER — Encounter (HOSPITAL_COMMUNITY)
Admission: RE | Admit: 2012-09-03 | Discharge: 2012-09-03 | Disposition: A | Discharge status: Home / Self Care | Payer: Medicare Other | Source: Ambulatory Visit | Attending: Nephrology | Admitting: Nephrology

## 2012-09-03 DIAGNOSIS — D638 Anemia in other chronic diseases classified elsewhere: Secondary | ICD-10-CM | POA: Insufficient documentation

## 2012-09-03 DIAGNOSIS — N184 Chronic kidney disease, stage 4 (severe): Secondary | ICD-10-CM | POA: Insufficient documentation

## 2012-09-03 LAB — FERRITIN: Ferritin: 105 ng/mL (ref 10–291)

## 2012-09-03 LAB — IRON AND TIBC
Iron: 91 ug/dL (ref 42–135)
TIBC: 226 ug/dL — ABNORMAL LOW (ref 250–470)
UIBC: 135 ug/dL (ref 125–400)

## 2012-09-03 LAB — POCT HEMOGLOBIN-HEMACUE: Hemoglobin: 11.6 g/dL — ABNORMAL LOW (ref 12.0–15.0)

## 2012-09-03 MED ORDER — EPOETIN ALFA 20000 UNIT/ML IJ SOLN
INTRAMUSCULAR | Status: AC
  Administered 2012-09-03: 20000 [IU]
  Filled 2012-09-03: qty 1

## 2012-09-03 MED ORDER — EPOETIN ALFA 10000 UNIT/ML IJ SOLN: 20000.0000 [IU] | INTRAMUSCULAR | Status: DC

## 2012-09-23 ENCOUNTER — Encounter (HOSPITAL_COMMUNITY)
Admission: RE | Admit: 2012-09-23 | Discharge: 2012-09-23 | Disposition: A | Discharge status: Home / Self Care | Payer: Medicare Other | Source: Ambulatory Visit | Attending: Nephrology | Admitting: Nephrology

## 2012-09-23 DIAGNOSIS — D638 Anemia in other chronic diseases classified elsewhere: Secondary | ICD-10-CM | POA: Insufficient documentation

## 2012-09-23 DIAGNOSIS — N184 Chronic kidney disease, stage 4 (severe): Secondary | ICD-10-CM | POA: Insufficient documentation

## 2012-09-23 MED ORDER — EPOETIN ALFA 10000 UNIT/ML IJ SOLN: 20000.0000 [IU] | INTRAMUSCULAR | Status: DC

## 2012-09-23 MED ORDER — EPOETIN ALFA 20000 UNIT/ML IJ SOLN
INTRAMUSCULAR | Status: AC
  Administered 2012-09-23: 20000 [IU] via SUBCUTANEOUS
  Filled 2012-09-23: qty 1

## 2012-10-14 ENCOUNTER — Encounter (HOSPITAL_COMMUNITY)
Admission: RE | Admit: 2012-10-14 | Discharge: 2012-10-14 | Disposition: A | Discharge status: Home / Self Care | Payer: Medicare Other | Source: Ambulatory Visit | Attending: Nephrology | Admitting: Nephrology

## 2012-10-14 DIAGNOSIS — N184 Chronic kidney disease, stage 4 (severe): Secondary | ICD-10-CM | POA: Insufficient documentation

## 2012-10-14 DIAGNOSIS — D638 Anemia in other chronic diseases classified elsewhere: Secondary | ICD-10-CM | POA: Insufficient documentation

## 2012-10-14 MED ORDER — EPOETIN ALFA 10000 UNIT/ML IJ SOLN: 20000.0000 [IU] | INTRAMUSCULAR | Status: DC

## 2012-10-14 MED ORDER — EPOETIN ALFA 20000 UNIT/ML IJ SOLN
INTRAMUSCULAR | Status: AC
  Administered 2012-10-14: 20000 [IU] via SUBCUTANEOUS
  Filled 2012-10-14: qty 1

## 2012-11-04 ENCOUNTER — Encounter (HOSPITAL_COMMUNITY)
Admission: RE | Admit: 2012-11-04 | Discharge: 2012-11-04 | Disposition: A | Discharge status: Home / Self Care | Payer: Medicare Other | Source: Ambulatory Visit | Attending: Nephrology | Admitting: Nephrology

## 2012-11-04 LAB — IRON AND TIBC
Iron: 82 ug/dL (ref 42–135)
Saturation Ratios: 32 % (ref 20–55)
TIBC: 254 ug/dL (ref 250–470)
UIBC: 172 ug/dL (ref 125–400)

## 2012-11-04 LAB — FERRITIN: Ferritin: 121 ng/mL (ref 10–291)

## 2012-11-04 MED ORDER — EPOETIN ALFA 10000 UNIT/ML IJ SOLN: 20000.0000 [IU] | INTRAMUSCULAR | Status: DC

## 2012-11-23 ENCOUNTER — Other Ambulatory Visit: Payer: Self-pay | Admitting: Nurse Practitioner

## 2012-11-23 LAB — COMPLETE METABOLIC PANEL WITH GFR
Alkaline Phosphatase: 118 U/L — ABNORMAL HIGH (ref 39–117)
BUN: 28 mg/dL — ABNORMAL HIGH (ref 6–23)
CO2: 28 mEq/L (ref 19–32)
Creat: 1.82 mg/dL — ABNORMAL HIGH (ref 0.50–1.10)
GFR, Est African American: 29 mL/min — ABNORMAL LOW
GFR, Est Non African American: 25 mL/min — ABNORMAL LOW
Glucose, Bld: 128 mg/dL — ABNORMAL HIGH (ref 70–99)
Sodium: 142 mEq/L (ref 135–145)
Total Bilirubin: 0.5 mg/dL (ref 0.3–1.2)

## 2012-11-24 ENCOUNTER — Other Ambulatory Visit (HOSPITAL_COMMUNITY): Payer: Self-pay | Admitting: *Deleted

## 2012-11-24 LAB — VITAMIN D 25 HYDROXY (VIT D DEFICIENCY, FRACTURES): Vit D, 25-Hydroxy: 34 ng/mL (ref 30–89)

## 2012-11-25 ENCOUNTER — Encounter (HOSPITAL_COMMUNITY)
Admission: RE | Admit: 2012-11-25 | Discharge: 2012-11-25 | Disposition: A | Discharge status: Home / Self Care | Payer: Medicare Other | Source: Ambulatory Visit | Attending: Nephrology | Admitting: Nephrology

## 2012-11-25 ENCOUNTER — Encounter (HOSPITAL_COMMUNITY): Payer: Medicare Other

## 2012-11-25 DIAGNOSIS — D638 Anemia in other chronic diseases classified elsewhere: Secondary | ICD-10-CM | POA: Insufficient documentation

## 2012-11-25 DIAGNOSIS — N184 Chronic kidney disease, stage 4 (severe): Secondary | ICD-10-CM | POA: Insufficient documentation

## 2012-11-25 MED ORDER — EPOETIN ALFA 10000 UNIT/ML IJ SOLN: 20000.0000 [IU] | INTRAMUSCULAR | Status: DC

## 2012-11-25 MED ORDER — EPOETIN ALFA 20000 UNIT/ML IJ SOLN
INTRAMUSCULAR | Status: AC
  Administered 2012-11-25: 20000 [IU] via SUBCUTANEOUS
  Filled 2012-11-25: qty 1

## 2012-11-26 ENCOUNTER — Encounter: Payer: Self-pay | Admitting: *Deleted

## 2012-11-26 LAB — NMR LIPOPROFILE WITH LIPIDS
HDL Size: 8.4 nm — ABNORMAL LOW (ref >=9.2)
HDL-C: 32 mg/dL — ABNORMAL LOW (ref >=40)
LDL (calc): 77 mg/dL (ref <100)
LDL Particle Number: 1058 nmol/L — ABNORMAL HIGH (ref <1000)
LDL Size: 20.7 nm (ref >20.5)
Triglycerides: 164 mg/dL — ABNORMAL HIGH (ref <150)
VLDL Size: 43.8 nm (ref >=46.6)

## 2012-11-27 ENCOUNTER — Encounter: Payer: Self-pay | Admitting: *Deleted

## 2012-12-01 ENCOUNTER — Other Ambulatory Visit: Payer: Self-pay

## 2012-12-01 MED ORDER — FUROSEMIDE 40 MG PO TABS: 40.0000 mg | ORAL_TABLET | Freq: Two times a day (BID) | ORAL | Status: DC

## 2012-12-16 ENCOUNTER — Encounter (HOSPITAL_COMMUNITY)
Admission: RE | Admit: 2012-12-16 | Discharge: 2012-12-16 | Disposition: A | Discharge status: Home / Self Care | Payer: Medicare Other | Source: Ambulatory Visit | Attending: Nephrology | Admitting: Nephrology

## 2012-12-16 DIAGNOSIS — N184 Chronic kidney disease, stage 4 (severe): Secondary | ICD-10-CM | POA: Insufficient documentation

## 2012-12-16 DIAGNOSIS — D638 Anemia in other chronic diseases classified elsewhere: Secondary | ICD-10-CM | POA: Insufficient documentation

## 2012-12-16 MED ORDER — EPOETIN ALFA 10000 UNIT/ML IJ SOLN: 20000.0000 [IU] | INTRAMUSCULAR | Status: DC

## 2012-12-16 MED ORDER — EPOETIN ALFA 20000 UNIT/ML IJ SOLN
INTRAMUSCULAR | Status: AC
  Administered 2012-12-16: 20000 [IU] via SUBCUTANEOUS
  Filled 2012-12-16: qty 1

## 2013-01-06 ENCOUNTER — Encounter (HOSPITAL_COMMUNITY)
Admission: RE | Admit: 2013-01-06 | Discharge: 2013-01-06 | Disposition: A | Discharge status: Home / Self Care | Payer: Medicare Other | Source: Ambulatory Visit | Attending: Nephrology | Admitting: Nephrology

## 2013-01-06 LAB — FERRITIN: Ferritin: 90 ng/mL (ref 10–291)

## 2013-01-06 LAB — POCT HEMOGLOBIN-HEMACUE: Hemoglobin: 11.1 g/dL — ABNORMAL LOW (ref 12.0–15.0)

## 2013-01-06 MED ORDER — EPOETIN ALFA 10000 UNIT/ML IJ SOLN: 20000.0000 [IU] | INTRAMUSCULAR | Status: DC

## 2013-01-06 MED ORDER — EPOETIN ALFA 20000 UNIT/ML IJ SOLN
INTRAMUSCULAR | Status: AC
  Administered 2013-01-06: 20000 [IU] via SUBCUTANEOUS
  Filled 2013-01-06: qty 1

## 2013-01-21 ENCOUNTER — Other Ambulatory Visit: Payer: Self-pay

## 2013-01-21 MED ORDER — AMITRIPTYLINE HCL 50 MG PO TABS: 50.0000 mg | ORAL_TABLET | Freq: Every day | ORAL | Status: DC

## 2013-01-21 MED ORDER — CARVEDILOL 12.5 MG PO TABS: ORAL_TABLET | ORAL | Status: DC

## 2013-01-21 NOTE — Telephone Encounter (Signed)
In patient  s chart Coreg is crossed out   Last seen 3/14

## 2013-01-27 ENCOUNTER — Encounter (HOSPITAL_COMMUNITY)
Admission: RE | Admit: 2013-01-27 | Discharge: 2013-01-27 | Disposition: A | Discharge status: Home / Self Care | Payer: Medicare Other | Source: Ambulatory Visit | Attending: Nephrology | Admitting: Nephrology

## 2013-01-27 DIAGNOSIS — N184 Chronic kidney disease, stage 4 (severe): Secondary | ICD-10-CM | POA: Insufficient documentation

## 2013-01-27 DIAGNOSIS — D638 Anemia in other chronic diseases classified elsewhere: Secondary | ICD-10-CM | POA: Insufficient documentation

## 2013-01-27 MED ORDER — EPOETIN ALFA 10000 UNIT/ML IJ SOLN: 20000.0000 [IU] | INTRAMUSCULAR | Status: DC

## 2013-01-27 MED ORDER — FERUMOXYTOL INJECTION 510 MG/17 ML
INTRAVENOUS | Status: AC
  Filled 2013-01-27: qty 17

## 2013-01-27 MED ORDER — EPOETIN ALFA 20000 UNIT/ML IJ SOLN
INTRAMUSCULAR | Status: AC
  Administered 2013-01-27: 20000 [IU] via SUBCUTANEOUS
  Filled 2013-01-27: qty 1

## 2013-02-16 ENCOUNTER — Other Ambulatory Visit (HOSPITAL_COMMUNITY): Payer: Self-pay | Admitting: *Deleted

## 2013-02-17 ENCOUNTER — Encounter (HOSPITAL_COMMUNITY)
Admission: RE | Admit: 2013-02-17 | Discharge: 2013-02-17 | Disposition: A | Discharge status: Home / Self Care | Payer: Medicare Other | Source: Ambulatory Visit | Attending: Nephrology | Admitting: Nephrology

## 2013-02-17 DIAGNOSIS — D638 Anemia in other chronic diseases classified elsewhere: Secondary | ICD-10-CM | POA: Insufficient documentation

## 2013-02-17 DIAGNOSIS — N184 Chronic kidney disease, stage 4 (severe): Secondary | ICD-10-CM | POA: Insufficient documentation

## 2013-02-17 MED ORDER — EPOETIN ALFA 20000 UNIT/ML IJ SOLN
INTRAMUSCULAR | Status: AC
  Administered 2013-02-17: 20000 [IU] via SUBCUTANEOUS
  Filled 2013-02-17: qty 1

## 2013-02-17 MED ORDER — EPOETIN ALFA 10000 UNIT/ML IJ SOLN: 20000.0000 [IU] | INTRAMUSCULAR | Status: DC

## 2013-02-24 ENCOUNTER — Encounter: Payer: Self-pay | Admitting: Nurse Practitioner

## 2013-02-24 ENCOUNTER — Ambulatory Visit (INDEPENDENT_AMBULATORY_CARE_PROVIDER_SITE_OTHER): Payer: Medicare Other | Admitting: Nurse Practitioner

## 2013-02-24 VITALS — BP 105/59 | HR 93 | Temp 97.8°F | Ht 64.0 in | Wt 150.0 lb

## 2013-02-24 DIAGNOSIS — I10 Essential (primary) hypertension: Secondary | ICD-10-CM

## 2013-02-24 DIAGNOSIS — I4891 Unspecified atrial fibrillation: Secondary | ICD-10-CM

## 2013-02-24 DIAGNOSIS — E119 Type 2 diabetes mellitus without complications: Secondary | ICD-10-CM

## 2013-02-24 DIAGNOSIS — M549 Dorsalgia, unspecified: Secondary | ICD-10-CM

## 2013-02-24 DIAGNOSIS — I499 Cardiac arrhythmia, unspecified: Secondary | ICD-10-CM

## 2013-02-24 DIAGNOSIS — E785 Hyperlipidemia, unspecified: Secondary | ICD-10-CM

## 2013-02-24 DIAGNOSIS — E559 Vitamin D deficiency, unspecified: Secondary | ICD-10-CM

## 2013-02-24 LAB — COMPLETE METABOLIC PANEL WITH GFR
ALT: 9 U/L (ref 0–35)
BUN: 42 mg/dL — ABNORMAL HIGH (ref 6–23)
CO2: 26 mEq/L (ref 19–32)
Calcium: 9.8 mg/dL (ref 8.4–10.5)
Chloride: 106 mEq/L (ref 96–112)
Creat: 2.1 mg/dL — ABNORMAL HIGH (ref 0.50–1.10)
GFR, Est African American: 24 mL/min — ABNORMAL LOW

## 2013-02-24 MED ORDER — FUROSEMIDE 40 MG PO TABS: 40.0000 mg | ORAL_TABLET | Freq: Two times a day (BID) | ORAL | Status: DC

## 2013-02-24 MED ORDER — CARVEDILOL 12.5 MG PO TABS: 6.2500 mg | ORAL_TABLET | Freq: Every day | ORAL | Status: DC

## 2013-02-24 MED ORDER — AMITRIPTYLINE HCL 50 MG PO TABS: 50.0000 mg | ORAL_TABLET | Freq: Every day | ORAL | Status: DC

## 2013-02-24 MED ORDER — GLIMEPIRIDE 2 MG PO TABS: 2.0000 mg | ORAL_TABLET | Freq: Every day | ORAL | Status: DC

## 2013-02-24 MED ORDER — RIVAROXABAN 15 MG PO TABS: 15.0000 mg | ORAL_TABLET | Freq: Every day | ORAL | Status: DC

## 2013-02-24 MED ORDER — ATORVASTATIN CALCIUM 40 MG PO TABS: 40.0000 mg | ORAL_TABLET | Freq: Every day | ORAL | Status: DC

## 2013-02-24 NOTE — Patient Instructions (Signed)
Atrial Fibrillation Your caregiver has diagnosed you with atrial fibrillation (AFib). The heart normally beats very regularly; AFib is a type of irregular heartbeat. The heart rate may be faster or slower than normal. This can prevent your heart from pumping as well as it should. AFib can be constant (chronic) or intermittent (paroxysmal). CAUSES  Atrial fibrillation may be caused by:  Heart disease, including heart attack, coronary artery disease, heart failure, diseases of the heart valves, and others.  Blood clot in the lungs (pulmonary embolism).  Pneumonia or other infections.  Chronic lung disease.  Thyroid disease.  Toxins. These include alcohol, some medications (such as decongestant medications or diet pills), and caffeine. In some people, no cause for AFib can be found. This is referred to as Lone Atrial Fibrillation. SYMPTOMS   Palpitations or a fluttering in your chest.  A vague sense of chest discomfort.  Shortness of breath.  Sudden onset of lightheadedness or weakness. Sometimes, the first sign of AFib can be a complication of the condition. This could be a stroke or heart failure. DIAGNOSIS  Your description of your condition may make your caregiver suspicious of atrial fibrillation. Your caregiver will examine your pulse to determine if fibrillation is present. An EKG (electrocardiogram) will confirm the diagnosis. Further testing may help determine what caused you to have atrial fibrillation. This may include chest x-ray, echocardiogram, blood tests, or CT scans. PREVENTION  If you have previously had atrial fibrillation, your caregiver may advise you to avoid substances known to cause the condition (such as stimulant medications, and possibly caffeine or alcohol). You may be advised to use medications to prevent recurrence. Proper treatment of any underlying condition is important to help prevent recurrence. PROGNOSIS  Atrial fibrillation does tend to become a  chronic condition over time. It can cause significant complications (see below). Atrial fibrillation is not usually immediately life-threatening, but it can shorten your life expectancy. This seems to be worse in women. If you have lone atrial fibrillation and are under 60 years old, the risk of complications is very low, and life expectancy is not shortened. RISKS AND COMPLICATIONS  Complications of atrial fibrillation can include stroke, chest pain, and heart failure. Your caregiver will recommend treatments for the atrial fibrillation, as well as for any underlying conditions, to help minimize risk of complications. TREATMENT  Treatment for AFib is divided into several categories:  Treatment of any underlying condition.  Converting you out of AFib into a regular (sinus) rhythm.  Controlling rapid heart rate.  Prevention of blood clots and stroke. Medications and procedures are available to convert your atrial fibrillation to sinus rhythm. However, recent studies have shown that this may not offer you any advantage, and cardiac experts are continuing research and debate on this topic. More important is controlling your rapid heartbeat. The rapid heartbeat causes more symptoms, and places strain on your heart. Your caregiver will advise you on the use of medications that can control your heart rate. Atrial fibrillation is a strong stroke risk. You can lessen this risk by taking blood thinning medications such as Coumadin (warfarin), or sometimes aspirin. These medications need close monitoring by your caregiver. Over-medication can cause bleeding. Too little medication may not protect against stroke. HOME CARE INSTRUCTIONS   If your caregiver prescribed medicine to make your heartbeat more normally, take as directed.  If blood thinners were prescribed by your caregiver, take EXACTLY as directed.  Perform blood tests EXACTLY as directed.  Quit smoking. Smoking increases your cardiac and   lung  (pulmonary) risks.  DO NOT drink alcohol.  DO NOT drink caffeinated drinks (e.g. coffee, soda, chocolate, and leaf teas). You may drink decaffeinated coffee, soda or tea.  If you are overweight, you should choose a reduced calorie diet to lose weight. Please see a registered dietitian if you need more information about healthy weight loss. DO NOT USE DIET PILLS as they may aggravate heart problems.  If you have other heart problems that are causing AFib, you may need to eat a low salt, fat, and cholesterol diet. Your caregiver will tell you if this is necessary.  Exercise every day to improve your physical fitness. Stay active unless advised otherwise.  If your caregiver has given you a follow-up appointment, it is very important to keep that appointment. Not keeping the appointment could result in heart failure or stroke. If there is any problem keeping the appointment, you must call back to this facility for assistance. SEEK MEDICAL CARE IF:  You notice a change in the rate, rhythm or strength of your heartbeat.  You develop an infection or any other change in your overall health status. SEEK IMMEDIATE MEDICAL CARE IF:   You develop chest pain, abdominal pain, sweating, weakness or feel sick to your stomach (nausea).  You develop shortness of breath.  You develop swollen feet and ankles.  You develop dizziness, numbness, or weakness of your face or limbs, or any change in vision or speech. MAKE SURE YOU:   Understand these instructions.  Will watch your condition.  Will get help right away if you are not doing well or get worse. Document Released: 08/26/2005 Document Revised: 11/18/2011 Document Reviewed: 04/04/2010 [employer] Patient Information 2014 [employer].  

## 2013-02-24 NOTE — Progress Notes (Signed)
Subjective:    Patient ID: Julie Carpenter, female    DOB: 02-06-1926, 79 y.o.   MRN: [id number]  Diabetes She presents for her follow-up diabetic visit. She has type 2 diabetes mellitus. No MedicAlert identification noted. The initial diagnosis of diabetes was made 25 years ago. Her disease course has been stable. There are no hypoglycemic associated symptoms. Pertinent negatives for diabetes include no chest pain, no foot paresthesias, no foot ulcerations, no polydipsia, no polyphagia, no polyuria, no visual change, no weakness and no weight loss. There are no hypoglycemic complications. Symptoms are stable. There are no diabetic complications. Risk factors for coronary artery disease include dyslipidemia, hypertension and post-menopausal. Current diabetic treatment includes diet and oral agent (monotherapy). She is compliant with treatment all of the time. Her weight is stable. She is following a diabetic diet. When asked about meal planning, she reported none. She has not had a previous visit with a dietician. She rarely participates in exercise. There is no change in her home blood glucose trend. Her breakfast blood glucose is taken between 7-8 am. Her breakfast blood glucose range is generally 110-130 mg/dl. Her dinner blood glucose range is generally 140-180 mg/dl. Her highest blood glucose is 110-130 mg/dl. An ACE inhibitor/angiotensin II receptor blocker is being taken. She does not see a podiatrist.Eye exam is current (september 2013).  Hypertension This is a chronic problem. The current episode started more than 1 year ago. The problem is controlled. Pertinent negatives include no chest pain, neck pain, palpitations, peripheral edema or shortness of breath. There are no associated agents to hypertension. Risk factors for coronary artery disease include diabetes mellitus, dyslipidemia, post-menopausal state and sedentary lifestyle. Past treatments include angiotensin blockers, calcium channel  blockers and diuretics. The current treatment provides significant improvement. There are no compliance problems.   Hyperlipidemia This is a chronic problem. The current episode started more than 1 year ago. The problem is controlled. Recent lipid tests were reviewed and are normal. Exacerbating diseases include diabetes. Pertinent negatives include no chest pain or shortness of breath. Current antihyperlipidemic treatment includes statins. The current treatment provides moderate improvement of lipids. Compliance problems include adherence to diet.  Risk factors for coronary artery disease include diabetes mellitus, hypertension, post-menopausal and a sedentary lifestyle.  Back pain amitruptyline- only takes at night- helps a little   Review of Systems  Constitutional: Negative for weight loss.  HENT: Negative for neck pain.   Respiratory: Negative for shortness of breath.   Cardiovascular: Negative for chest pain and palpitations.  Endocrine: Negative for polydipsia, polyphagia and polyuria.  Neurological: Negative for weakness.  All other systems reviewed and are negative.       Objective:   Physical Exam  Constitutional: She is oriented to person, place, and time. She appears well-developed and well-nourished.  HENT:  Nose: Nose normal.  Mouth/Throat: Oropharynx is clear and moist.  Eyes: EOM are normal.  Neck: Trachea normal, normal range of motion and full passive range of motion without pain. Neck supple. No JVD present. Carotid bruit is not present. No thyromegaly present.  Cardiovascular: Normal rate, normal heart sounds and intact distal pulses.  Exam reveals no gallop and no friction rub.   No murmur heard. Irregular rhythym  Pulmonary/Chest: Effort normal and breath sounds normal.  Abdominal: Soft. Bowel sounds are normal. She exhibits no distension and no mass. There is no tenderness.  Musculoskeletal: Normal range of motion.  Lymphadenopathy:    She has no cervical  adenopathy.  Neurological: She is  alert and oriented to person, place, and time. She has normal reflexes.  Skin: Skin is warm and dry.  Psychiatric: She has a normal mood and affect. Her behavior is normal. Judgment and thought content normal.   BP 105/59  Pulse 93  Temp(Src) 97.8 F (36.6 C) (Oral)  Ht 5\' 4"  (1.626 m)  Wt 150 lb (68.04 kg)  BMI 25.73 kg/m2 Results for orders placed in visit on 02/24/13  POCT GLYCOSYLATED HEMOGLOBIN (HGB A1C)      Result Value Range   Hemoglobin A1C 6.2%     EKG- New onset atrial fib CHF-0 Hypertension-1 Age-63 diabetes-1 Stroke-0 [place] score total -3        Assessment & Plan:   1. Hypertension   2. Hyperlipidemia   3. Vitamin D deficiency   4. Irregular heart beat   5. Type II or unspecified type diabetes mellitus without mention of complication, not stated as uncontrolled   6. Back pain   7. Atrial fibrillation    Orders Placed This Encounter  Procedures  . Vitamin D 25 hydroxy  . COMPLETE METABOLIC PANEL WITH GFR  . NMR Lipoprofile with Lipids  . POCT glycosylated hemoglobin (Hb A1C)  . EKG 12-Lead   Meds ordered this encounter  Medications  . DISCONTD: atorvastatin (LIPITOR) 40 MG tablet    Sig: Take 40 mg by mouth daily.   Marland Kitchen DISCONTD: glimepiride (AMARYL) 2 MG tablet    Sig: Take 2 mg by mouth daily before breakfast.   . aspirin 81 MG tablet    Sig: Take 81 mg by mouth daily.  . calcium carbonate (OS-CAL) 600 MG TABS    Sig: Take 600 mg by mouth daily.  Marland Kitchen DISCONTD: carvedilol (COREG) 12.5 MG tablet    Sig: Take 6.25 mg by mouth daily. One daily  . amitriptyline (ELAVIL) 50 MG tablet    Sig: Take 1 tablet (50 mg total) by mouth at bedtime.    Dispense:  90 tablet    Refill:  1    Order Specific Question:  Supervising Provider    Answer:  Ernestina Penna [[id number]]  . atorvastatin (LIPITOR) 40 MG tablet    Sig: Take 1 tablet (40 mg total) by mouth daily.    Dispense:  90 tablet    Refill:  1    Order Specific  Question:  Supervising Provider    Answer:  Ernestina Penna [[id number]]  . carvedilol (COREG) 12.5 MG tablet    Sig: Take 0.5 tablets (6.25 mg total) by mouth daily. One daily    Dispense:  90 tablet    Refill:  1    Order Specific Question:  Supervising Provider    Answer:  Ernestina Penna [[id number]]  . furosemide (LASIX) 40 MG tablet    Sig: Take 1 tablet (40 mg total) by mouth 2 (two) times daily.    Dispense:  180 tablet    Refill:  1    Order Specific Question:  Supervising Provider    Answer:  Ernestina Penna [[id number]]  . glimepiride (AMARYL) 2 MG tablet    Sig: Take 1 tablet (2 mg total) by mouth daily before breakfast.    Dispense:  30 tablet    Refill:  5    Order Specific Question:  Supervising Provider    Answer:  Ernestina Penna [[id number]]  . Rivaroxaban (XARELTO) 15 MG TABS tablet    Sig: Take 1 tablet (15 mg total) by mouth daily.    Dispense:  30 tablet    Refill:  5    Order Specific Question:  Supervising Provider    Answer:  Deborra Medina   Continue all meds Added xalerto for atrial fib Stop ASA Follow up in 1 ,month  Mary-Margaret Daphine Deutscher, FNP

## 2013-02-25 LAB — NMR LIPOPROFILE WITH LIPIDS
HDL Particle Number: 28 umol/L — ABNORMAL LOW (ref >=30.5)
LDL (calc): 84 mg/dL (ref <100)
LDL Particle Number: 1004 nmol/L — ABNORMAL HIGH (ref <1000)
LDL Size: 19.8 nm — ABNORMAL LOW (ref >20.5)
LP-IR Score: 59 — ABNORMAL HIGH (ref <=45)
Small LDL Particle Number: 659 nmol/L — ABNORMAL HIGH (ref <=527)

## 2013-03-09 ENCOUNTER — Other Ambulatory Visit (HOSPITAL_COMMUNITY): Payer: Self-pay | Admitting: *Deleted

## 2013-03-10 ENCOUNTER — Encounter (HOSPITAL_COMMUNITY)
Admission: RE | Admit: 2013-03-10 | Discharge: 2013-03-10 | Disposition: A | Discharge status: Home / Self Care | Payer: Medicare Other | Source: Ambulatory Visit | Attending: Nephrology | Admitting: Nephrology

## 2013-03-10 DIAGNOSIS — N184 Chronic kidney disease, stage 4 (severe): Secondary | ICD-10-CM | POA: Insufficient documentation

## 2013-03-10 DIAGNOSIS — D638 Anemia in other chronic diseases classified elsewhere: Secondary | ICD-10-CM | POA: Insufficient documentation

## 2013-03-10 MED ORDER — EPOETIN ALFA 10000 UNIT/ML IJ SOLN: 20000.0000 [IU] | INTRAMUSCULAR | Status: DC

## 2013-03-10 MED ORDER — EPOETIN ALFA 20000 UNIT/ML IJ SOLN
INTRAMUSCULAR | Status: AC
  Administered 2013-03-10: 20000 [IU] via SUBCUTANEOUS
  Filled 2013-03-10: qty 1

## 2013-03-31 ENCOUNTER — Encounter (HOSPITAL_COMMUNITY)
Admission: RE | Admit: 2013-03-31 | Discharge: 2013-03-31 | Disposition: A | Discharge status: Home / Self Care | Payer: Medicare Other | Source: Ambulatory Visit | Attending: Nephrology | Admitting: Nephrology

## 2013-03-31 LAB — IRON AND TIBC
Saturation Ratios: 25 % (ref 20–55)
UIBC: 172 ug/dL (ref 125–400)

## 2013-03-31 LAB — FERRITIN: Ferritin: 46 ng/mL (ref 10–291)

## 2013-03-31 MED ORDER — EPOETIN ALFA 10000 UNIT/ML IJ SOLN: 20000.0000 [IU] | INTRAMUSCULAR | Status: DC

## 2013-03-31 MED ORDER — EPOETIN ALFA 20000 UNIT/ML IJ SOLN
INTRAMUSCULAR | Status: AC
  Administered 2013-03-31: 20000 [IU]
  Filled 2013-03-31: qty 1

## 2013-04-02 ENCOUNTER — Emergency Department (HOSPITAL_COMMUNITY)
Admission: EM | Admit: 2013-04-02 | Discharge: 2013-04-02 | Disposition: A | Discharge status: Home / Self Care | Payer: Medicare Other | Attending: Emergency Medicine | Admitting: Emergency Medicine

## 2013-04-02 ENCOUNTER — Encounter (HOSPITAL_COMMUNITY): Payer: Self-pay | Admitting: Emergency Medicine

## 2013-04-02 ENCOUNTER — Encounter: Payer: Self-pay | Admitting: Family Medicine

## 2013-04-02 ENCOUNTER — Ambulatory Visit (INDEPENDENT_AMBULATORY_CARE_PROVIDER_SITE_OTHER): Payer: Medicare Other | Admitting: Family Medicine

## 2013-04-02 VITALS — BP 131/76 | HR 67 | Temp 97.3°F | Wt 148.8 lb

## 2013-04-02 DIAGNOSIS — R5383 Other fatigue: Secondary | ICD-10-CM | POA: Insufficient documentation

## 2013-04-02 DIAGNOSIS — K922 Gastrointestinal hemorrhage, unspecified: Secondary | ICD-10-CM

## 2013-04-02 DIAGNOSIS — R5381 Other malaise: Secondary | ICD-10-CM | POA: Insufficient documentation

## 2013-04-02 DIAGNOSIS — E119 Type 2 diabetes mellitus without complications: Secondary | ICD-10-CM | POA: Insufficient documentation

## 2013-04-02 DIAGNOSIS — E785 Hyperlipidemia, unspecified: Secondary | ICD-10-CM | POA: Insufficient documentation

## 2013-04-02 DIAGNOSIS — K921 Melena: Secondary | ICD-10-CM | POA: Insufficient documentation

## 2013-04-02 DIAGNOSIS — D649 Anemia, unspecified: Secondary | ICD-10-CM

## 2013-04-02 DIAGNOSIS — I4891 Unspecified atrial fibrillation: Secondary | ICD-10-CM | POA: Insufficient documentation

## 2013-04-02 DIAGNOSIS — Z79899 Other long term (current) drug therapy: Secondary | ICD-10-CM | POA: Insufficient documentation

## 2013-04-02 DIAGNOSIS — Z880 Allergy status to penicillin: Secondary | ICD-10-CM | POA: Insufficient documentation

## 2013-04-02 DIAGNOSIS — Z7901 Long term (current) use of anticoagulants: Secondary | ICD-10-CM | POA: Insufficient documentation

## 2013-04-02 DIAGNOSIS — I10 Essential (primary) hypertension: Secondary | ICD-10-CM | POA: Insufficient documentation

## 2013-04-02 HISTORY — DX: Unspecified atrial fibrillation: I48.91

## 2013-04-02 LAB — BASIC METABOLIC PANEL
BUN: 39 mg/dL — ABNORMAL HIGH (ref 6–23)
CO2: 30 mEq/L (ref 19–32)
Chloride: 105 mEq/L (ref 96–112)
Creatinine, Ser: 1.9 mg/dL — ABNORMAL HIGH (ref 0.50–1.10)
Glucose, Bld: 258 mg/dL — ABNORMAL HIGH (ref 70–99)

## 2013-04-02 LAB — CBC WITH DIFFERENTIAL/PLATELET
Basophils Relative: 1 % (ref 0–1)
HCT: 30.1 % — ABNORMAL LOW (ref 36.0–46.0)
Hemoglobin: 9.7 g/dL — ABNORMAL LOW (ref 12.0–15.0)
Lymphocytes Relative: 22 % (ref 12–46)
Lymphs Abs: 1.3 10*3/uL (ref 0.7–4.0)
MCHC: 32.2 g/dL (ref 30.0–36.0)
Monocytes Absolute: 0.5 10*3/uL (ref 0.1–1.0)
Monocytes Relative: 9 % (ref 3–12)
Neutro Abs: 3.7 10*3/uL (ref 1.7–7.7)
RBC: 3.29 MIL/uL — ABNORMAL LOW (ref 3.87–5.11)

## 2013-04-02 LAB — POCT CBC
Granulocyte percent: 66.4 %G (ref 37–80)
Hemoglobin: 9.8 g/dL — AB (ref 12.2–16.2)
MCH, POC: 28.9 pg (ref 27–31.2)
MPV: 7.4 fL (ref 0–99.8)
POC LYMPH PERCENT: 24 %L (ref 10–50)
RBC: 3.4 M/uL — AB (ref 4.04–5.48)

## 2013-04-02 MED ORDER — SODIUM CHLORIDE 0.9 % IV SOLN
Freq: Once | INTRAVENOUS | Status: AC
  Administered 2013-04-02: 14:00:00 via INTRAVENOUS

## 2013-04-02 MED ORDER — PANTOPRAZOLE SODIUM 40 MG IV SOLR
40.0000 mg | Freq: Once | INTRAVENOUS | Status: AC
  Administered 2013-04-02: 40 mg via INTRAVENOUS
  Filled 2013-04-02: qty 40

## 2013-04-02 NOTE — ED Notes (Signed)
Pt sent by Dr.Newton for possible GI bleed and low hemoglobin. Pt c/o black stools. Denies abd pain. Denies dizziness. Pt started on coumadin about a month ago-last dose x 2 days ago.

## 2013-04-02 NOTE — ED Provider Notes (Signed)
CSN: [id number]     Arrival date & time 04/02/13  1256 History     First MD Initiated Contact with Patient 04/02/13 1326     Chief Complaint  Patient presents with  . GI Bleeding   (Consider location/radiation/quality/duration/timing/severity/associated sxs/prior Treatment) Patient is a 79 y.o. female presenting with weakness. The history is provided by the patient (the pt was sent from her md for gi bleeding).  Weakness This is a new problem. The current episode started more than 1 week ago. The problem occurs constantly. The problem has not changed since onset.Pertinent negatives include no chest pain, no abdominal pain and no headaches. Nothing aggravates the symptoms.    Past Medical History  Diagnosis Date  . Hyperlipidemia   . Hypertension   . Diabetes mellitus without complication   . Atrial fibrillation    Past Surgical History  Procedure Laterality Date  . Abdominal hysterectomy    . Cholecystectomy    . Vesicovaginal fistula closure w/ tah     No family history on file. History  Substance Use Topics  . Smoking status: Never Smoker   . Smokeless tobacco: Not on file  . Alcohol Use: No   OB History   Grav Para Term Preterm Abortions TAB SAB Ect Mult Living                 Review of Systems  Constitutional: Negative for appetite change and fatigue.  HENT: Negative for congestion, sinus pressure and ear discharge.   Eyes: Negative for discharge.  Respiratory: Negative for cough.   Cardiovascular: Negative for chest pain.  Gastrointestinal: Positive for blood in stool. Negative for abdominal pain and diarrhea.  Genitourinary: Negative for frequency and hematuria.  Musculoskeletal: Negative for back pain.  Skin: Negative for rash.  Neurological: Positive for weakness. Negative for seizures and headaches.  Psychiatric/Behavioral: Negative for hallucinations.    Allergies  Benicar; Ciprofloxacin; Hctz; Januvia; Norvasc; Penicillins; and Ramipril  Home  Medications   Current Outpatient Rx  Name  Route  Sig  Dispense  Refill  . amitriptyline (ELAVIL) 50 MG tablet   Oral   Take 1 tablet (50 mg total) by mouth at bedtime.   90 tablet   1   . atorvastatin (LIPITOR) 40 MG tablet   Oral   Take 1 tablet (40 mg total) by mouth daily.   90 tablet   1   . calcium carbonate (OS-CAL) 600 MG TABS   Oral   Take 600 mg by mouth daily.         . carvedilol (COREG) 12.5 MG tablet   Oral   Take 0.5 tablets (6.25 mg total) by mouth daily. One daily   90 tablet   1   . furosemide (LASIX) 40 MG tablet   Oral   Take 1 tablet (40 mg total) by mouth 2 (two) times daily.   180 tablet   1   . glimepiride (AMARYL) 2 MG tablet   Oral   Take 1 tablet (2 mg total) by mouth daily before breakfast.   30 tablet   5   . Rivaroxaban (XARELTO) 15 MG TABS tablet   Oral   Take 1 tablet (15 mg total) by mouth daily.   30 tablet   5    BP 138/61  Pulse 61  Temp(Src) 97.9 F (36.6 C) (Oral)  Resp 18  Ht 5\' 4"  (1.626 m)  Wt 148 lb (67.132 kg)  BMI 25.39 kg/m2  SpO2 100% Physical Exam  Constitutional: She is oriented to person, place, and time. She appears well-developed.  HENT:  Head: Normocephalic.  Eyes: Conjunctivae and EOM are normal. No scleral icterus.  Neck: Neck supple. No thyromegaly present.  Cardiovascular: Normal rate and regular rhythm.  Exam reveals no gallop and no friction rub.   No murmur heard. Pulmonary/Chest: No stridor. She has no wheezes. She has no rales. She exhibits no tenderness.  Abdominal: She exhibits no distension. There is no tenderness. There is no rebound.  Genitourinary:  Rectal brown stool hem pos  Musculoskeletal: Normal range of motion. She exhibits no edema.  Lymphadenopathy:    She has no cervical adenopathy.  Neurological: She is oriented to person, place, and time. Coordination normal.  Skin: No rash noted. No erythema.  Psychiatric: She has a normal mood and affect. Her behavior is normal.     ED Course   Procedures (including critical care time)  Labs Reviewed  CBC WITH DIFFERENTIAL - Abnormal; Notable for the following:    RBC 3.29 (*)    Hemoglobin 9.7 (*)    HCT 30.1 (*)    Eosinophils Relative 7 (*)    All other components within normal limits  BASIC METABOLIC PANEL - Abnormal; Notable for the following:    Glucose, Bld 258 (*)    BUN 39 (*)    Creatinine, Ser 1.90 (*)    GFR calc non Af Amer 23 (*)    GFR calc Af Amer 27 (*)    All other components within normal limits  PROTIME-INR   No results found. 1. GI bleeding    Date: 04/02/2013  Rate: 60  Rhythm: normal sinus rhythm  QRS Axis: normal  Intervals: normal  ST/T Wave abnormalities: normal  Conduction Disutrbances:none  Narrative Interpretation:   Old EKG Reviewed: none available   Pt with hg of 9.7.  Now in nsr.  Spoke to hospitalist who agrees with stopping xarelto and out pt follow up  MDM  Gi bleed will follow up  Benny Lennert, MD 04/02/13 1538

## 2013-04-02 NOTE — Progress Notes (Signed)
  Subjective:    Patient ID: Julie Carpenter, female    DOB: 12-Sep-1925, 79 y.o.   MRN: [id number]  HPI Patient presents today for followup of anemia and dark stools. Patient is followed by nephrology outpatient for anemia. Patient receives what sounds like epo injections every 3 weeks. Patient went for her Depo injection this week and was found to have a hemoglobin of 8.9 on check. This was felt to be below baseline. Patient was then instructed to have a followup appointment with her PCP. Of note, patient was recent started on Xarelto for A. fib approximately one month ago. This was a new diagnosis. She had CHADS2 score of 3. Xarelto started by her PCP. Pt does report a remote hx/o upper Gi bleeds in the past.  No frank blood per rectum per pt.      Review of Systems  All other systems reviewed and are negative.       Objective:   Physical Exam  Constitutional: She appears well-developed and well-nourished.  HENT:  Head: Normocephalic and atraumatic.  Eyes: Conjunctivae are normal. Pupils are equal, round, and reactive to light.  Neck: Normal range of motion.  Cardiovascular: Normal rate and regular rhythm.   Pulmonary/Chest: Effort normal.  Abdominal: Soft.  Genitourinary:  + external hemmorhoids. Non thrombosed.  Hemoccult positive Mildly dark stools.   Neurological: She is alert.  Skin: Skin is warm.          Assessment & Plan:  Anemia - Plan: POCT CBC  GIB (gastrointestinal bleeding)  Hemoglobin 9.8 today. One unit up from most recent check. However, hemoglobin is slowly trending down. Given patient's history of upper GI bleeds in the past as well as xarelto use currently, will refer patient to the ER for further evaluation. Would seem to be an upper GI source as there is no frank blood on rectal exam as well as patient's history of upper GI bleeding. Patient is overall hemodynamically stable with a normal exam currently. Will have patient go to the ER via car  transport by her daughter.

## 2013-04-05 ENCOUNTER — Telehealth: Payer: Self-pay | Admitting: Gastroenterology

## 2013-04-05 ENCOUNTER — Encounter: Payer: Self-pay | Admitting: Gastroenterology

## 2013-04-05 ENCOUNTER — Ambulatory Visit (INDEPENDENT_AMBULATORY_CARE_PROVIDER_SITE_OTHER): Payer: Medicare Other | Admitting: Gastroenterology

## 2013-04-05 VITALS — BP 93/55 | HR 74 | Temp 97.6°F | Ht 64.0 in | Wt 151.0 lb

## 2013-04-05 DIAGNOSIS — K922 Gastrointestinal hemorrhage, unspecified: Secondary | ICD-10-CM | POA: Insufficient documentation

## 2013-04-05 NOTE — Telephone Encounter (Signed)
Pt called this morning to make OV with SF per [facility] ER. She said she had internal bleeding and her stools were black. I offered her 2 different OV, but she said she could pass out before then. Please advise if patient should come in an Urgent spot. [phone]

## 2013-04-05 NOTE — Telephone Encounter (Signed)
Pt is aware and will be seen today at 230 by AS

## 2013-04-05 NOTE — Telephone Encounter (Signed)
My urgent cancelled for today. Have her come today at 2:30 if she can. If not, next urgent available.

## 2013-04-05 NOTE — Progress Notes (Signed)
Primary Care Physician:  Rudi Heap, MD Primary Gastroenterologist:  Dr. Darrick Penna   Chief Complaint  Patient presents with  . Melena    HPI:   79 year old very pleasant female presents today secondary to melena. She was heme positive in her primary care's office. Appears baseline Hgb around 10-11. Found to have Hgb 8.9 , down from 10.5 earlier this month.  Notes remote history of bleeding ulcer; however, she doesn't remember any work-up surrounding this. Denies any abdominal pain. Melena started about 4 weeks ago. No bright red blood per rectum. No N/V. No weight loss, lack of appetite. No dysphagia. Occasional NSAIDs   Xarelto started about 4 weeks ago. Last dose Friday. Colonoscopy about 12 years ago in [place]. No polyps per patient. No changes in bowel habits.   Past Medical History  Diagnosis Date  . Hyperlipidemia   . Hypertension   . Diabetes mellitus without complication   . Atrial fibrillation     Past Surgical History  Procedure Laterality Date  . Abdominal hysterectomy    . Cholecystectomy    . Bladder tack      Current Outpatient Prescriptions  Medication Sig Dispense Refill  . amitriptyline (ELAVIL) 50 MG tablet Take 1 tablet (50 mg total) by mouth at bedtime.  90 tablet  1  . atorvastatin (LIPITOR) 40 MG tablet Take 1 tablet (40 mg total) by mouth daily.  90 tablet  1  . calcium carbonate (OS-CAL) 600 MG TABS Take 600 mg by mouth daily.      . carvedilol (COREG) 12.5 MG tablet Take 0.5 tablets (6.25 mg total) by mouth daily. One daily  90 tablet  1  . furosemide (LASIX) 40 MG tablet Take 1 tablet (40 mg total) by mouth 2 (two) times daily.  180 tablet  1  . glimepiride (AMARYL) 2 MG tablet Take 1 tablet (2 mg total) by mouth daily before breakfast.  30 tablet  5   No current facility-administered medications for this visit.    Allergies as of 04/05/2013 - Review Complete 04/05/2013  Allergen Reaction Noted  . Benicar (olmesartan medoxomil)  12/26/2010  .  Ciprofloxacin  12/26/2010  . Hctz (hydrochlorothiazide)  12/26/2010  . Januvia (sitagliptin phosphate)  12/26/2010  . Norvasc (amlodipine besylate)  12/26/2010  . Penicillins  12/26/2010  . Ramipril  12/26/2010    Family History  Problem Relation Age of Onset  . Colon cancer Neg Hx     History   Social History  . Marital Status: Widowed    Spouse Name: N/A    Number of Children: N/A  . Years of Education: N/A   Occupational History  . Not on file.   Social History Main Topics  . Smoking status: Never Smoker   . Smokeless tobacco: Not on file  . Alcohol Use: No  . Drug Use: No  . Sexually Active: Not on file   Other Topics Concern  . Not on file   Social History Narrative  . No narrative on file    Review of Systems: Negative unless mentioned in HPI.  Physical Exam: BP 93/55  Pulse 74  Temp(Src) 97.6 F (36.4 C) (Oral)  Ht 5\' 4"  (1.626 m)  Wt 151 lb (68.493 kg)  BMI 25.91 kg/m2 General:   Alert and oriented. Pleasant and cooperative. Well-nourished and well-developed.  Head:  Normocephalic and atraumatic. Eyes:  Without icterus, sclera clear and conjunctiva pink.  Ears:  Mild HOH Nose:  No deformity, discharge,  or lesions. Mouth:  No deformity or lesions, dentures Neck:  Supple, without mass or thyromegaly. Lungs:  Clear to auscultation bilaterally. No wheezes, rales, or rhonchi. No distress.  Heart:  S1, S2 present, irregular  Abdomen:  +BS, soft, non-tender and non-distended. No HSM noted. No guarding or rebound. No masses appreciated.  Rectal:  Deferred  Msk:  Symmetrical without gross deformities. Normal posture. Extremities:  Without clubbing or edema. Neurologic:  Alert and  oriented x4;  grossly normal neurologically. Skin:  Intact without significant lesions or rashes. Cervical Nodes:  No significant cervical adenopathy. Psych:  Alert and cooperative. Normal mood and affect.   Lab Results  Component Value Date   WBC 6.0 04/02/2013   HGB  9.7* 04/02/2013   HCT 30.1* 04/02/2013   MCV 91.5 04/02/2013   PLT 207 04/02/2013

## 2013-04-05 NOTE — Patient Instructions (Addendum)
We have scheduled you for an upper endoscopy with Dr. Darrick Penna.   Do not take your diabetes medication (Amaryl) tomorrow morning.  Further recommendations to follow.

## 2013-04-05 NOTE — Assessment & Plan Note (Signed)
79 year old with acute on chronic anemia and melena in the setting of Xarelto for afib. Xarelto has been stopped by the ED as of 7/25. Reported remote history of PUD, without records available. Concern for upper GI bleed at this time, unable to exclude small bowel involvement due to Xarelto. Recommend EGD as soon as possible, consider capsule study if no findings on EGD. No lower GI evaluation at this point unless evidence of bright red blood in future. Last colonoscopy in remote past in [place]. With the short half-life of Xarelto, patient will be appropriate for a procedure tomorrow, July 29th. I verified this with the pharmacy at [facility] as well.   Proceed with upper endoscopy in the near future with Dr. Darrick Penna. The risks, benefits, and alternatives have been discussed in detail with patient. They have stated understanding and desire to proceed.  Continue to hold Xarelto

## 2013-04-05 NOTE — Telephone Encounter (Signed)
Called pt and she said she was supposed to follow up this week for black stools. Tobi Bastos, please advise if we should use urgent. ( her discharge note said to follow up in a week).

## 2013-04-05 NOTE — Telephone Encounter (Signed)
Yes. Needs urgent slot tomorrow.

## 2013-04-06 ENCOUNTER — Ambulatory Visit: Payer: Self-pay | Admitting: Family Medicine

## 2013-04-06 ENCOUNTER — Encounter (HOSPITAL_COMMUNITY)
Admission: RE | Disposition: A | Discharge status: Home / Self Care | Payer: Self-pay | Source: Ambulatory Visit | Attending: Gastroenterology

## 2013-04-06 ENCOUNTER — Encounter (HOSPITAL_COMMUNITY): Payer: Self-pay

## 2013-04-06 ENCOUNTER — Ambulatory Visit (HOSPITAL_COMMUNITY)
Admission: RE | Admit: 2013-04-06 | Discharge: 2013-04-06 | Disposition: A | Discharge status: Home / Self Care | Payer: Medicare Other | Source: Ambulatory Visit | Attending: Gastroenterology | Admitting: Gastroenterology

## 2013-04-06 DIAGNOSIS — I10 Essential (primary) hypertension: Secondary | ICD-10-CM | POA: Insufficient documentation

## 2013-04-06 DIAGNOSIS — K921 Melena: Secondary | ICD-10-CM | POA: Insufficient documentation

## 2013-04-06 DIAGNOSIS — E119 Type 2 diabetes mellitus without complications: Secondary | ICD-10-CM | POA: Insufficient documentation

## 2013-04-06 DIAGNOSIS — Z880 Allergy status to penicillin: Secondary | ICD-10-CM | POA: Insufficient documentation

## 2013-04-06 DIAGNOSIS — Z7901 Long term (current) use of anticoagulants: Secondary | ICD-10-CM | POA: Insufficient documentation

## 2013-04-06 DIAGNOSIS — K2960 Other gastritis without bleeding: Secondary | ICD-10-CM | POA: Insufficient documentation

## 2013-04-06 DIAGNOSIS — K2970 Gastritis, unspecified, without bleeding: Secondary | ICD-10-CM

## 2013-04-06 DIAGNOSIS — Z79899 Other long term (current) drug therapy: Secondary | ICD-10-CM | POA: Insufficient documentation

## 2013-04-06 DIAGNOSIS — K2990 Gastroduodenitis, unspecified, without bleeding: Secondary | ICD-10-CM

## 2013-04-06 DIAGNOSIS — Z888 Allergy status to other drugs, medicaments and biological substances status: Secondary | ICD-10-CM | POA: Insufficient documentation

## 2013-04-06 DIAGNOSIS — D649 Anemia, unspecified: Secondary | ICD-10-CM

## 2013-04-06 DIAGNOSIS — K922 Gastrointestinal hemorrhage, unspecified: Secondary | ICD-10-CM

## 2013-04-06 DIAGNOSIS — Z883 Allergy status to other anti-infective agents status: Secondary | ICD-10-CM | POA: Insufficient documentation

## 2013-04-06 DIAGNOSIS — E785 Hyperlipidemia, unspecified: Secondary | ICD-10-CM | POA: Insufficient documentation

## 2013-04-06 DIAGNOSIS — D62 Acute posthemorrhagic anemia: Secondary | ICD-10-CM | POA: Insufficient documentation

## 2013-04-06 DIAGNOSIS — I4891 Unspecified atrial fibrillation: Secondary | ICD-10-CM | POA: Insufficient documentation

## 2013-04-06 HISTORY — PX: ESOPHAGOGASTRODUODENOSCOPY: SHX5428

## 2013-04-06 HISTORY — PX: GIVENS CAPSULE STUDY: SHX5432

## 2013-04-06 SURGERY — EGD (ESOPHAGOGASTRODUODENOSCOPY)
Anesthesia: Moderate Sedation

## 2013-04-06 MED ORDER — OMEPRAZOLE 20 MG PO CPDR: DELAYED_RELEASE_CAPSULE | ORAL | Status: DC

## 2013-04-06 MED ORDER — MIDAZOLAM HCL 5 MG/5ML IJ SOLN
INTRAMUSCULAR | Status: DC | PRN
  Administered 2013-04-06 (×2): 1 mg via INTRAVENOUS

## 2013-04-06 MED ORDER — MEPERIDINE HCL 100 MG/ML IJ SOLN
INTRAMUSCULAR | Status: DC
Start: 2013-04-06 — End: 2013-04-06
  Filled 2013-04-06: qty 2

## 2013-04-06 MED ORDER — MEPERIDINE HCL 100 MG/ML IJ SOLN
INTRAMUSCULAR | Status: DC | PRN
  Administered 2013-04-06: 25 mg via INTRAVENOUS

## 2013-04-06 MED ORDER — STERILE WATER FOR IRRIGATION IR SOLN
Status: DC | PRN
  Administered 2013-04-06: 09:00:00

## 2013-04-06 MED ORDER — SODIUM CHLORIDE 0.9 % IV SOLN
INTRAVENOUS | Status: DC
  Administered 2013-04-06: 09:00:00 via INTRAVENOUS

## 2013-04-06 MED ORDER — MIDAZOLAM HCL 5 MG/5ML IJ SOLN
INTRAMUSCULAR | Status: AC
  Filled 2013-04-06: qty 10

## 2013-04-06 MED ORDER — BUTAMBEN-TETRACAINE-BENZOCAINE 2-2-14 % EX AERO
INHALATION_SPRAY | CUTANEOUS | Status: DC | PRN
  Administered 2013-04-06: 2 via TOPICAL

## 2013-04-06 NOTE — H&P (Addendum)
  Primary Care Physician:  MOORE, DONALD, MD Primary Gastroenterologist:  Dr. Aubrynn Katona  Pre-Procedure History & Physical: HPI:  Julie Carpenter is a 79 y.o. female here for  ANEMIA/MELENA.  Past Medical History  Diagnosis Date  . Hyperlipidemia   . Hypertension   . Diabetes mellitus without complication   . Atrial fibrillation     Past Surgical History  Procedure Laterality Date  . Abdominal hysterectomy    . Cholecystectomy    . Bladder tack      Prior to Admission medications   Medication Sig Start Date End Date Taking? Authorizing Provider  amitriptyline (ELAVIL) 50 MG tablet Take 1 tablet (50 mg total) by mouth at bedtime. 02/24/13  Yes Mary-Margaret Martin, FNP  atorvastatin (LIPITOR) 40 MG tablet Take 1 tablet (40 mg total) by mouth daily. 02/24/13  Yes Mary-Margaret Martin, FNP  calcium carbonate (OS-CAL) 600 MG TABS Take 600 mg by mouth daily.   Yes Historical Provider, MD  carvedilol (COREG) 12.5 MG tablet Take 0.5 tablets (6.25 mg total) by mouth daily. One daily 02/24/13  Yes Mary-Margaret Martin, FNP  furosemide (LASIX) 40 MG tablet Take 1 tablet (40 mg total) by mouth 2 (two) times daily. 02/24/13  Yes Mary-Margaret Martin, FNP  glimepiride (AMARYL) 2 MG tablet Take 1 tablet (2 mg total) by mouth daily before breakfast. 02/24/13  Yes Mary-Margaret Martin, FNP    Allergies as of 04/05/2013 - Review Complete 04/05/2013  Allergen Reaction Noted  . Benicar (olmesartan medoxomil)  12/26/2010  . Ciprofloxacin  12/26/2010  . Hctz (hydrochlorothiazide)  12/26/2010  . Januvia (sitagliptin phosphate)  12/26/2010  . Norvasc (amlodipine besylate)  12/26/2010  . Penicillins  12/26/2010  . Ramipril  12/26/2010    Family History  Problem Relation Age of Onset  . Colon cancer Neg Hx     History   Social History  . Marital Status: Widowed    Spouse Name: N/A    Number of Children: N/A  . Years of Education: N/A   Occupational History  . Not on file.   Social History  Main Topics  . Smoking status: Never Smoker   . Smokeless tobacco: Not on file  . Alcohol Use: No  . Drug Use: No  . Sexually Active: Not on file   Other Topics Concern  . Not on file   Social History Narrative  . No narrative on file    Review of Systems: See HPI, otherwise negative ROS   Physical Exam: BP 153/69  Pulse 71  Temp(Src) 97.6 F (36.4 C) (Oral)  Resp 15  Ht 5' 4" (1.626 m)  Wt 151 lb (68.493 kg)  BMI 25.91 kg/m2  SpO2 100% General:   Alert,  pleasant and cooperative in NAD Head:  Normocephalic and atraumatic. Neck:  Supple; Lungs:  Clear throughout to auscultation.    Heart:  Regular rate and rhythm. Abdomen:  Soft, nontender and nondistended. Normal bowel sounds, without guarding, and without rebound.   Neurologic:  Alert and  oriented x4;  grossly normal neurologically.  Impression/Plan:      MELENA on xarelto/ANEMIA  PLAN: 1. EGD/possible capsule endoscopy TODAY   

## 2013-04-06 NOTE — Progress Notes (Signed)
REVIEWED.  

## 2013-04-06 NOTE — Op Note (Signed)
[facility] [address] [place], [address]   ENDOSCOPY PROCEDURE REPORT  PATIENT: Julie Carpenter, Julie Carpenter  MR#: [id number] BIRTHDATE: 02/22/1926 , 86  yrs. old GENDER: Female  ENDOSCOPIST: Jonette Eva, MD REFERRED XB:JYNWGN Christell Constant, M.D.  PROCEDURE DATE: 04/06/2013 PROCEDURE:   EGD w/ GIVENS capsule placement  INDICATIONS:Acute post hemorrhagic anemia.Julie Carpenter. LAST BLACK TARRY STOOL S YESTERDAY. MEDICATIONS: Demerol 25 mg IV and Versed 2 mg IV TOPICAL ANESTHETIC:   Cetacaine Spray  DESCRIPTION OF PROCEDURE:     Physical exam was performed.  Informed consent was obtained from the patient after explaining the benefits, risks, and alternatives to the procedure.  The patient was connected to the monitor and placed in the left lateral position.  Continuous oxygen was provided by nasal cannula and IV medicine administered through an indwelling cannula.  After administration of sedation, the patients esophagus was intubated and the EG-[id number] ([id number])  endoscope was advanced under direct visualization to the second portion of the duodenum.  The scope was removed slowly by carefully examining the color, texture, anatomy, and integrity of the mucosa on the way out.  The patient was recovered in endoscopy and discharged home in satisfactory condition.   ESOPHAGUS: The mucosa of the esophagus appeared normal.   STOMACH: Non-erosive gastritis (inflammation) was found in the gastric antrum.   DUODENUM: The duodenal mucosa showed no abnormalities in the bulb and second portion of the duodenum.   NO OLD BLOOD OR FRESH BLOOD IN STOMACH OR DUODENUM. GIVENS CAPSULE PLACED IN SECOND PORTION OF TEH DUODENUM.  COMPLICATIONS:   None  ENDOSCOPIC IMPRESSION: 1.   MILD Non-erosive gastritis RECOMMENDATIONS: RETURN GIVENS RECODER THSI AFTER NOON.  WILL NEED TCS IF NO SOURCE FOR MELENA IDENTIFIED. HOLD XARELTO. MAY USE ASA DAILY UNTIL GI EVALUATION IS COMPLETE. ADD OMEPRAZOLE  DAILY. OPV IN 4 MOS   REPEAT EXAM:   _______________________________ Julie DoctorJonette Eva, MD 04/06/2013 10:12 AM

## 2013-04-06 NOTE — OR Nursing (Signed)
Pill cam dropped into duodenum at 0950

## 2013-04-07 ENCOUNTER — Encounter (HOSPITAL_COMMUNITY): Payer: Self-pay | Admitting: Gastroenterology

## 2013-04-12 ENCOUNTER — Ambulatory Visit (INDEPENDENT_AMBULATORY_CARE_PROVIDER_SITE_OTHER): Payer: Medicare Other | Admitting: Nurse Practitioner

## 2013-04-12 ENCOUNTER — Encounter: Payer: Self-pay | Admitting: Nurse Practitioner

## 2013-04-12 VITALS — BP 101/63 | HR 90 | Temp 99.5°F | Ht 64.0 in | Wt 151.0 lb

## 2013-04-12 DIAGNOSIS — E119 Type 2 diabetes mellitus without complications: Secondary | ICD-10-CM

## 2013-04-12 DIAGNOSIS — E785 Hyperlipidemia, unspecified: Secondary | ICD-10-CM

## 2013-04-12 DIAGNOSIS — D649 Anemia, unspecified: Secondary | ICD-10-CM

## 2013-04-12 DIAGNOSIS — I4891 Unspecified atrial fibrillation: Secondary | ICD-10-CM

## 2013-04-12 DIAGNOSIS — K922 Gastrointestinal hemorrhage, unspecified: Secondary | ICD-10-CM

## 2013-04-12 DIAGNOSIS — I10 Essential (primary) hypertension: Secondary | ICD-10-CM

## 2013-04-12 LAB — POCT CBC
Lymph, poc: 1.2 (ref 0.6–3.4)
MCHC: 32.3 g/dL (ref 31.8–35.4)
MPV: 7.7 fL (ref 0–99.8)
POC Granulocyte: 5.4 (ref 2–6.9)
POC LYMPH PERCENT: 17.6 %L (ref 10–50)
RDW, POC: 15.9 %
WBC: 7.1 10*3/uL (ref 4.6–10.2)

## 2013-04-12 NOTE — Patient Instructions (Signed)

## 2013-04-12 NOTE — Progress Notes (Signed)
Subjective:    Patient ID: Julie Carpenter, female    DOB: [birth year], 79 y.o.   MRN: [id number]  HPI patient here today for followup of chrom=nic medical problems- She is doing well- No complaints today. Patient is a diabetic - fasting blood sugars averaging 130's no low blood sugars. Patient doesn't watch diet and has very little exercise. ast eye exam was 1 year ago- patient to make appointmnet foir follow up eye exam. Patient Active Problem List   Diagnosis Date Noted  . GI bleed 04/05/2013  . Atrial fibrillation 02/24/2013  . HTN (hypertension) 12/26/2010  . CKD (chronic kidney disease) 12/26/2010  . Hyperlipemia 12/26/2010  . Peripheral neuropathy 12/26/2010  . Anemia 12/26/2010  . NIDDM (non-insulin dependent diabetes mellitus) 12/26/2010  . Vertebral fracture 12/26/2010  . Venous insufficiency 12/26/2010  . Thrombophlebitis of leg, left, superficial 12/26/2010  . Osteoporosis 12/26/2010  . Thyromegaly 12/26/2010   Outpatient Encounter Prescriptions as of 04/12/2013  Medication Sig Dispense Refill  . amitriptyline (ELAVIL) 50 MG tablet Take 1 tablet (50 mg total) by mouth at bedtime.  90 tablet  1  . atorvastatin (LIPITOR) 40 MG tablet Take 1 tablet (40 mg total) by mouth daily.  90 tablet  1  . calcium carbonate (OS-CAL) 600 MG TABS Take 600 mg by mouth daily.      . carvedilol (COREG) 12.5 MG tablet Take 0.5 tablets (6.25 mg total) by mouth daily. One daily  90 tablet  1  . furosemide (LASIX) 40 MG tablet Take 1 tablet (40 mg total) by mouth 2 (two) times daily.  180 tablet  1  . glimepiride (AMARYL) 2 MG tablet Take 1 tablet (2 mg total) by mouth daily before breakfast.  30 tablet  5  . omeprazole (PRILOSEC) 20 MG capsule 1 PO EVERY MORNING  31 capsule  11  . [DISCONTINUED] XARELTO 15 MG TABS tablet        No facility-administered encounter medications on file as of 04/12/2013.       Review of Systems  Constitutional: Negative for activity change, appetite change and  fatigue.  HENT: Negative.   Respiratory: Negative for cough, chest tightness and shortness of breath.   Cardiovascular: Negative for chest pain, palpitations and leg swelling.  Gastrointestinal: Negative for vomiting, diarrhea, constipation and blood in stool.  Endocrine: Negative.   Genitourinary: Negative.   Musculoskeletal: Negative.   Neurological: Negative.   Hematological: Negative.   Psychiatric/Behavioral: Negative.        Objective:   Physical Exam  Constitutional: She is oriented to person, place, and time. She appears well-developed and well-nourished.  HENT:  Nose: Nose normal.  Mouth/Throat: Oropharynx is clear and moist.  Eyes: EOM are normal.  Neck: Trachea normal, normal range of motion and full passive range of motion without pain. Neck supple. No JVD present. Carotid bruit is not present. No thyromegaly present.  Cardiovascular: Normal rate, regular rhythm and intact distal pulses.  Exam reveals no gallop and no friction rub.   Murmur (2/6 systolic ) heard. Pulmonary/Chest: Effort normal and breath sounds normal.  Abdominal: Soft. Bowel sounds are normal. She exhibits no distension and no mass. There is no tenderness.  Musculoskeletal: Normal range of motion.  Lymphadenopathy:    She has no cervical adenopathy.  Neurological: She is alert and oriented to person, place, and time. She has normal reflexes.  Skin: Skin is warm and dry.  Psychiatric: She has a normal mood and affect. Her behavior is normal. Judgment and thought  content normal.    BP 101/63  Pulse 90  Temp(Src) 99.5 F (37.5 C)  Ht 5\' 4"  (1.626 m)  Wt 151 lb (68.493 kg)  BMI 25.91 kg/m2       Assessment & Plan:   1. Anemia   2. GIB (gastrointestinal bleeding)   3. Hypertension   4. Hyperlipidemia   5. Type II or unspecified type diabetes mellitus without mention of complication, not stated as uncontrolled   6. Atrial fibrillation    Orders Placed This Encounter  Procedures  . POCT  CBC   diet rich in iron Dr. Lynwood Dawley doesn't want her on iron tablets Recheck hgb in 1 week  Mary-Margaret Daphine Deutscher, FNP'

## 2013-04-14 NOTE — Progress Notes (Signed)
Cc PCP 

## 2013-04-19 ENCOUNTER — Telehealth: Payer: Self-pay | Admitting: *Deleted

## 2013-04-19 ENCOUNTER — Other Ambulatory Visit (INDEPENDENT_AMBULATORY_CARE_PROVIDER_SITE_OTHER): Payer: Medicare Other

## 2013-04-19 ENCOUNTER — Other Ambulatory Visit: Payer: Self-pay | Admitting: *Deleted

## 2013-04-19 DIAGNOSIS — E119 Type 2 diabetes mellitus without complications: Secondary | ICD-10-CM

## 2013-04-19 DIAGNOSIS — M549 Dorsalgia, unspecified: Secondary | ICD-10-CM

## 2013-04-19 DIAGNOSIS — E785 Hyperlipidemia, unspecified: Secondary | ICD-10-CM

## 2013-04-19 DIAGNOSIS — I10 Essential (primary) hypertension: Secondary | ICD-10-CM

## 2013-04-19 DIAGNOSIS — R7989 Other specified abnormal findings of blood chemistry: Secondary | ICD-10-CM

## 2013-04-19 DIAGNOSIS — D649 Anemia, unspecified: Secondary | ICD-10-CM

## 2013-04-19 LAB — POCT HEMOGLOBIN: Hemoglobin: 10.4 g/dL — AB (ref 12.2–16.2)

## 2013-04-19 MED ORDER — CARVEDILOL 12.5 MG PO TABS: 6.2500 mg | ORAL_TABLET | Freq: Every day | ORAL | Status: DC

## 2013-04-19 MED ORDER — AMITRIPTYLINE HCL 50 MG PO TABS: 50.0000 mg | ORAL_TABLET | Freq: Every day | ORAL | Status: DC

## 2013-04-19 MED ORDER — FUROSEMIDE 40 MG PO TABS: 40.0000 mg | ORAL_TABLET | Freq: Two times a day (BID) | ORAL | Status: DC

## 2013-04-19 MED ORDER — GLIMEPIRIDE 2 MG PO TABS: 2.0000 mg | ORAL_TABLET | Freq: Every day | ORAL | Status: DC

## 2013-04-19 MED ORDER — ATORVASTATIN CALCIUM 40 MG PO TABS: 40.0000 mg | ORAL_TABLET | Freq: Every day | ORAL | Status: DC

## 2013-04-19 NOTE — Telephone Encounter (Signed)
Pt's mother called for pt's results from last week. Please advise [phone]

## 2013-04-19 NOTE — Telephone Encounter (Signed)
Tried to call pt. LMOM for a return call.  

## 2013-04-19 NOTE — Telephone Encounter (Signed)
PT's daughter, Glendell Docker called for results for  her mom. She also said they had returned the recorder. I told her it takes a few days, and we will call when results are ready.

## 2013-04-21 ENCOUNTER — Encounter (HOSPITAL_COMMUNITY)
Admission: RE | Admit: 2013-04-21 | Discharge: 2013-04-21 | Disposition: A | Discharge status: Home / Self Care | Payer: Medicare Other | Source: Ambulatory Visit | Attending: Nephrology | Admitting: Nephrology

## 2013-04-21 ENCOUNTER — Telehealth: Payer: Self-pay | Admitting: *Deleted

## 2013-04-21 DIAGNOSIS — D638 Anemia in other chronic diseases classified elsewhere: Secondary | ICD-10-CM | POA: Insufficient documentation

## 2013-04-21 DIAGNOSIS — N184 Chronic kidney disease, stage 4 (severe): Secondary | ICD-10-CM | POA: Insufficient documentation

## 2013-04-21 MED ORDER — EPOETIN ALFA 20000 UNIT/ML IJ SOLN
INTRAMUSCULAR | Status: AC
  Filled 2013-04-21: qty 1

## 2013-04-21 MED ORDER — EPOETIN ALFA 10000 UNIT/ML IJ SOLN
20000.0000 [IU] | INTRAMUSCULAR | Status: DC
  Administered 2013-04-21: 20000 [IU] via SUBCUTANEOUS

## 2013-04-21 NOTE — Telephone Encounter (Signed)
Pt came into our office for rck on HGB- was 10.4g/dl   Julie Floor, fnp would like to know the reason Dr Briant Cedar doesn't want pt on iron- (for our records) Pt states that [name] [facility] doesn't want her taking any iron. thanks

## 2013-04-22 MED FILL — Epoetin Alfa Inj 20000 Unit/ML: INTRAMUSCULAR | Qty: 1 | Status: AC

## 2013-04-23 MED ORDER — PEG-KCL-NACL-NASULF-NA ASC-C 100 G PO SOLR: 1.0000 | Freq: Once | ORAL | Status: DC

## 2013-04-23 NOTE — Telephone Encounter (Addendum)
STILL SEEING BLACK STOOL. GIVENS CAPSULE NORMAL BUT INCOMPLETE. TCS AUG 25 AT 1015 AM. ASKED DAUGHTER TO BE IN SHORT STAY AT 0915. RX FOR MOVI-PREP SENT. DAUGHTER EXPECTING CALL WITH COMPLETE INSTRUCTIONS. HOLD AMARYL ON AM OF PROCEDURE.

## 2013-04-23 NOTE — Addendum Note (Signed)
Addended by: [place] on: 04/23/2013 02:12 PM   Modules accepted: Orders

## 2013-04-23 NOTE — Telephone Encounter (Signed)
GI should be calling with full instructions for moviprep

## 2013-04-23 NOTE — Telephone Encounter (Signed)
REVIEWED.  

## 2013-04-23 NOTE — Brief Op Note (Signed)
04/06/2013  1:59 PM  PATIENT:  Julie Carpenter  79 y.o. female  PRE-OPERATIVE DIAGNOSIS:  GI Bleed/MELENA-2014: HB DECREASED FROM 11 TO 9.2.  POST-OPERATIVE DIAGNOSIS:  NORMAL SMALL BOWEL-INCOMPLETE STUDY  PROCEDURE:  Procedure(s) with comments: GIVENS CAPSULE STUDY  SURGEON:  Surgeon(s) and Role:    * West Bali, MD - Primary   PATIENT DATA: 151 LBS, HEIGHT: 51 IN, WAIST: 25  IN, GASTRIC PASSAGE TIME: 1 m, SB PASSAGE TIME: UNABLE TO CALCULATE  RESULTS: LIMITED views of gastric mucosa due to retained contents. No blood in the stomach. NORMAL SMALL BOWEL. No masses,ULCERS, or AVMs SEEN. NO OLD BLOOD OR FRESH BLOOD SEEN. CAPSULE DID NOT MAKE IT TO THE CECUM.  DIAGNOSIS: NORMAL SMALL BOWLEL. MELENA OF UNCERTAIN ETIOLOGY-AVM/?POLYP, RIGHT COLON CA  Plan: 1. CONTINUE PROCRIT 2. HOLD XARELTO 3. TCS AUG 25 AT 1015 AM.

## 2013-04-26 ENCOUNTER — Other Ambulatory Visit: Payer: Self-pay | Admitting: Gastroenterology

## 2013-04-26 DIAGNOSIS — R195 Other fecal abnormalities: Secondary | ICD-10-CM

## 2013-04-26 NOTE — Telephone Encounter (Signed)
Patient is scheduled w/SLF on Monday Aug 25th at 10:45, I have mailed instructions as well as called her daughter

## 2013-04-27 ENCOUNTER — Encounter (HOSPITAL_COMMUNITY): Payer: Self-pay | Admitting: Pharmacy Technician

## 2013-05-03 ENCOUNTER — Encounter (HOSPITAL_COMMUNITY)
Admission: RE | Disposition: A | Discharge status: Home / Self Care | Payer: Self-pay | Source: Ambulatory Visit | Attending: Gastroenterology

## 2013-05-03 ENCOUNTER — Encounter (HOSPITAL_COMMUNITY): Payer: Self-pay | Admitting: *Deleted

## 2013-05-03 ENCOUNTER — Ambulatory Visit (HOSPITAL_COMMUNITY)
Admission: RE | Admit: 2013-05-03 | Discharge: 2013-05-03 | Disposition: A | Discharge status: Home / Self Care | Payer: Medicare Other | Source: Ambulatory Visit | Attending: Gastroenterology | Admitting: Gastroenterology

## 2013-05-03 DIAGNOSIS — R195 Other fecal abnormalities: Secondary | ICD-10-CM

## 2013-05-03 DIAGNOSIS — K5520 Angiodysplasia of colon without hemorrhage: Secondary | ICD-10-CM

## 2013-05-03 DIAGNOSIS — K620 Anal polyp: Secondary | ICD-10-CM

## 2013-05-03 DIAGNOSIS — Z01812 Encounter for preprocedural laboratory examination: Secondary | ICD-10-CM | POA: Insufficient documentation

## 2013-05-03 DIAGNOSIS — K921 Melena: Secondary | ICD-10-CM

## 2013-05-03 DIAGNOSIS — D126 Benign neoplasm of colon, unspecified: Secondary | ICD-10-CM | POA: Insufficient documentation

## 2013-05-03 DIAGNOSIS — K5730 Diverticulosis of large intestine without perforation or abscess without bleeding: Secondary | ICD-10-CM | POA: Insufficient documentation

## 2013-05-03 DIAGNOSIS — K621 Rectal polyp: Secondary | ICD-10-CM

## 2013-05-03 DIAGNOSIS — D649 Anemia, unspecified: Secondary | ICD-10-CM | POA: Insufficient documentation

## 2013-05-03 DIAGNOSIS — K648 Other hemorrhoids: Secondary | ICD-10-CM | POA: Insufficient documentation

## 2013-05-03 DIAGNOSIS — Q2733 Arteriovenous malformation of digestive system vessel: Secondary | ICD-10-CM | POA: Insufficient documentation

## 2013-05-03 DIAGNOSIS — D128 Benign neoplasm of rectum: Secondary | ICD-10-CM | POA: Insufficient documentation

## 2013-05-03 HISTORY — PX: COLONOSCOPY: SHX5424

## 2013-05-03 LAB — GLUCOSE, CAPILLARY

## 2013-05-03 LAB — CBC WITH DIFFERENTIAL/PLATELET
Basophils Relative: 1 % (ref 0–1)
HCT: 30.9 % — ABNORMAL LOW (ref 36.0–46.0)
Hemoglobin: 9.8 g/dL — ABNORMAL LOW (ref 12.0–15.0)
Lymphocytes Relative: 19 % (ref 12–46)
MCHC: 31.7 g/dL (ref 30.0–36.0)
Monocytes Absolute: 0.6 10*3/uL (ref 0.1–1.0)
Monocytes Relative: 13 % — ABNORMAL HIGH (ref 3–12)
Neutro Abs: 2.3 10*3/uL (ref 1.7–7.7)

## 2013-05-03 SURGERY — COLONOSCOPY
Anesthesia: Moderate Sedation

## 2013-05-03 MED ORDER — MIDAZOLAM HCL 5 MG/5ML IJ SOLN
INTRAMUSCULAR | Status: AC
  Filled 2013-05-03: qty 10

## 2013-05-03 MED ORDER — MEPERIDINE HCL 100 MG/ML IJ SOLN
INTRAMUSCULAR | Status: DC | PRN
  Administered 2013-05-03: 25 mg via INTRAVENOUS

## 2013-05-03 MED ORDER — STERILE WATER FOR IRRIGATION IR SOLN
Status: DC | PRN
  Administered 2013-05-03: 11:00:00

## 2013-05-03 MED ORDER — SODIUM CHLORIDE 0.9 % IV SOLN
INTRAVENOUS | Status: DC
  Administered 2013-05-03: 10:00:00 via INTRAVENOUS

## 2013-05-03 MED ORDER — ONDANSETRON HCL 4 MG/2ML IJ SOLN
INTRAMUSCULAR | Status: AC
  Filled 2013-05-03: qty 2

## 2013-05-03 MED ORDER — MIDAZOLAM HCL 5 MG/5ML IJ SOLN
INTRAMUSCULAR | Status: DC | PRN
  Administered 2013-05-03 (×2): 2 mg via INTRAVENOUS

## 2013-05-03 MED ORDER — MEPERIDINE HCL 100 MG/ML IJ SOLN
INTRAMUSCULAR | Status: AC
  Filled 2013-05-03: qty 2

## 2013-05-03 MED ORDER — ONDANSETRON HCL 4 MG/2ML IJ SOLN
INTRAMUSCULAR | Status: DC | PRN
  Administered 2013-05-03: 4 mg via INTRAVENOUS

## 2013-05-03 NOTE — Op Note (Signed)
[facility] [address] [place], [address]   COLONOSCOPY PROCEDURE REPORT  PATIENT: Julie Carpenter, Julie Carpenter  MR#: [id number] BIRTHDATE: 1925/09/30 , 86  yrs. old GENDER: Female ENDOSCOPIST: Jonette Eva, MD REFERRED XB:JYNW Sheron Nightingale, N.P.  Primitivo Gauze, M.D. PROCEDURE DATE:  05/03/2013 PROCEDURE:   Colonoscopy with snare polypectomy, Colonoscopy with cold biopsy polypectomy, and Colonoscopy with tissue ablation INDICATIONS:MELENA AND ANEMIA. PT REPORTS SEEING BLACK STOOL DURING BOWEL PREP. JUN 2014: STARTED XARELTO FOR AFIB. JUL 2014: EGD/CAPSULE-NO SOURCE FOR MELENA IDENTIFIED. MEDICATIONS: Demerol 25 mg IV and Versed 4 mg IV  DESCRIPTION OF PROCEDURE:    Physical exam was performed.  Informed consent was obtained from the patient after explaining the benefits, risks, and alternatives to procedure.  The patient was connected to monitor and placed in left lateral position. Continuous oxygen was provided by nasal cannula and IV medicine administered through an indwelling cannula.  After administration of sedation and rectal exam, the patients rectum was intubated and the EC-3890Li ([id number])  colonoscope was advanced under direct visualization to the ileum.  The scope was removed slowly by carefully examining the color, texture, anatomy, and integrity mucosa on the way out.  The patient was recovered in endoscopy and discharged home in satisfactory condition.    COLON FINDINGS: AVM NOTED in the ascending colon. BICAP APPLIED(25W). LESION ABLATED.   Moderate diverticulosis was noted throughout the entire examined colon.  , Two sessile polyps measuring 3-6 mm in size were found in the transverse colon and rectum.  A polypectomy was performed with cold forceps and using snare cautery.  Small internal hemorrhoids were found.    NO OLD BLOOD OR FRESH BLOOD SEEN IN TEH COLON.  PREP QUALITY: excellent.  CECAL W/D TIME: 16 minutes COMPLICATIONS: None  ENDOSCOPIC  IMPRESSION: 1.   MELENA MOST LIKELY DUE TO ONE AVM in the ascending colon 2.   Moderate diverticulosis hroughout the entire examined colon 3.   Two POLYPS REMOVED 4.   Small internal hemorrhoids   RECOMMENDATIONS: CHECK BLOOD COUNT AND FERRITIN TODAY. FOLLOW A HIGH FIBER DIET.  AVOID ITEMS THAT CAUSE BLOATING. BIOPSY RESULTS SHOULD BE BACK IN 7 DAYS.  WILL LET PT KNOW AT THAT TIME WHEN TO RE-START XARELTO.        _______________________________ Rosalie DoctorJonette Eva, MD 05/03/2013 12:49 PM     PATIENT NAME:  Julie Carpenter, Julie Carpenter MR#: [id number]

## 2013-05-03 NOTE — Telephone Encounter (Signed)
PLEASE CALL PT. HER BLOOD COUNT IS BETTER AT 9.8 AND HER IRON STORES ARE NORMAL. I REVIEWED HER ECG WITH CARDIOLOGY AND SHE DOES HAVE A FIB, BUT HER HEART GOES IN AND OUT OF IT. IF SHE WANTS TO REDUCE HER RISK OF STROKE SHE SHOULD CONTINUE XARELTO. I WILL CALL HER IN 7 DAYS TO LET HER KNOW WHEN TO RESTART THE MEDICATION.

## 2013-05-03 NOTE — H&P (View-Only) (Signed)
  Primary Care Physician:  Rudi Heap, MD Primary Gastroenterologist:  Dr. Darrick Penna  Pre-Procedure History & Physical: HPI:  Julie Carpenter is a 79 y.o. female here for  ANEMIA/MELENA.  Past Medical History  Diagnosis Date  . Hyperlipidemia   . Hypertension   . Diabetes mellitus without complication   . Atrial fibrillation     Past Surgical History  Procedure Laterality Date  . Abdominal hysterectomy    . Cholecystectomy    . Bladder tack      Prior to Admission medications   Medication Sig Start Date End Date Taking? Authorizing Provider  amitriptyline (ELAVIL) 50 MG tablet Take 1 tablet (50 mg total) by mouth at bedtime. 02/24/13  Yes Mary-Margaret Daphine Deutscher, FNP  atorvastatin (LIPITOR) 40 MG tablet Take 1 tablet (40 mg total) by mouth daily. 02/24/13  Yes Mary-Margaret Daphine Deutscher, FNP  calcium carbonate (OS-CAL) 600 MG TABS Take 600 mg by mouth daily.   Yes Historical Provider, MD  carvedilol (COREG) 12.5 MG tablet Take 0.5 tablets (6.25 mg total) by mouth daily. One daily 02/24/13  Yes Mary-Margaret Daphine Deutscher, FNP  furosemide (LASIX) 40 MG tablet Take 1 tablet (40 mg total) by mouth 2 (two) times daily. 02/24/13  Yes Mary-Margaret Daphine Deutscher, FNP  glimepiride (AMARYL) 2 MG tablet Take 1 tablet (2 mg total) by mouth daily before breakfast. 02/24/13  Yes Mary-Margaret Daphine Deutscher, FNP    Allergies as of 04/05/2013 - Review Complete 04/05/2013  Allergen Reaction Noted  . Benicar (olmesartan medoxomil)  12/26/2010  . Ciprofloxacin  12/26/2010  . Hctz (hydrochlorothiazide)  12/26/2010  . Januvia (sitagliptin phosphate)  12/26/2010  . Norvasc (amlodipine besylate)  12/26/2010  . Penicillins  12/26/2010  . Ramipril  12/26/2010    Family History  Problem Relation Age of Onset  . Colon cancer Neg Hx     History   Social History  . Marital Status: Widowed    Spouse Name: N/A    Number of Children: N/A  . Years of Education: N/A   Occupational History  . Not on file.   Social History  Main Topics  . Smoking status: Never Smoker   . Smokeless tobacco: Not on file  . Alcohol Use: No  . Drug Use: No  . Sexually Active: Not on file   Other Topics Concern  . Not on file   Social History Narrative  . No narrative on file    Review of Systems: See HPI, otherwise negative ROS   Physical Exam: BP 153/69  Pulse 71  Temp(Src) 97.6 F (36.4 C) (Oral)  Resp 15  Ht 5\' 4"  (1.626 m)  Wt 151 lb (68.493 kg)  BMI 25.91 kg/m2  SpO2 100% General:   Alert,  pleasant and cooperative in NAD Head:  Normocephalic and atraumatic. Neck:  Supple; Lungs:  Clear throughout to auscultation.    Heart:  Regular rate and rhythm. Abdomen:  Soft, nontender and nondistended. Normal bowel sounds, without guarding, and without rebound.   Neurologic:  Alert and  oriented x4;  grossly normal neurologically.  Impression/Plan:      MELENA on xarelto/ANEMIA  PLAN: 1. EGD/possible capsule endoscopy TODAY

## 2013-05-03 NOTE — Interval H&P Note (Signed)
History and Physical Interval Note:  05/03/2013 10:46 AM  Julie Carpenter  has presented today for surgery, with the diagnosis of BLACK STOOLS  The various methods of treatment have been discussed with the patient and family. After consideration of risks, benefits and other options for treatment, the patient has consented to  Procedure(s) with comments: COLONOSCOPY (N/A) - 10:45 as a surgical intervention .  The patient's history has been reviewed, patient examined, no change in status, stable for surgery.  I have reviewed the patient's chart and labs.  Questions were answered to the patient's satisfaction.     [employer]

## 2013-05-04 ENCOUNTER — Other Ambulatory Visit (HOSPITAL_COMMUNITY): Payer: Self-pay

## 2013-05-04 NOTE — Telephone Encounter (Signed)
Called and informed pt.  

## 2013-05-05 ENCOUNTER — Encounter (HOSPITAL_COMMUNITY)
Admission: RE | Admit: 2013-05-05 | Discharge: 2013-05-05 | Disposition: A | Discharge status: Home / Self Care | Payer: Medicare Other | Source: Ambulatory Visit | Attending: Nephrology | Admitting: Nephrology

## 2013-05-05 ENCOUNTER — Encounter (HOSPITAL_COMMUNITY): Payer: Self-pay | Admitting: Gastroenterology

## 2013-05-05 MED ORDER — EPOETIN ALFA 10000 UNIT/ML IJ SOLN: 20000.0000 [IU] | INTRAMUSCULAR | Status: DC

## 2013-05-05 MED ORDER — EPOETIN ALFA 20000 UNIT/ML IJ SOLN
INTRAMUSCULAR | Status: AC
  Administered 2013-05-05: 20000 [IU] via SUBCUTANEOUS
  Filled 2013-05-05: qty 1

## 2013-05-12 ENCOUNTER — Encounter (HOSPITAL_COMMUNITY): Payer: Medicare Other

## 2013-05-12 NOTE — Telephone Encounter (Signed)
Please call pt. She had simple adenomas removed from her colon.  SHE MAY RESTART HER XARELTO. CALL WITH QUESTIONS OR CONCERNS ABOUT BLACK TARRY/STICKY STOOLS.

## 2013-05-12 NOTE — Telephone Encounter (Signed)
Called and informed pt. She will go to [facility] next week to get CBC drawn. I will fax the order to them.

## 2013-05-12 NOTE — Telephone Encounter (Addendum)
PLEASE CALL PT. She should call me if AFTER RE-STARTING THE XARELTO, she begins to have 3-4 dark BMs daily. BLACK STOOLS FROM BLEEDING WILL NOT HAPPEN JUST ONCE A DAY. She should have a CBC next week.

## 2013-05-12 NOTE — Addendum Note (Signed)
Addended by: West Bali on: 05/12/2013 12:16 PM   Modules accepted: Orders

## 2013-05-12 NOTE — Telephone Encounter (Signed)
Called and informed pt and she said she has black tarry stools about every other day. ( She has one BM daily) Please advise!

## 2013-05-19 ENCOUNTER — Encounter (HOSPITAL_COMMUNITY)
Admission: RE | Admit: 2013-05-19 | Discharge: 2013-05-19 | Disposition: A | Discharge status: Home / Self Care | Payer: Medicare Other | Source: Ambulatory Visit | Attending: Nephrology | Admitting: Nephrology

## 2013-05-19 DIAGNOSIS — N184 Chronic kidney disease, stage 4 (severe): Secondary | ICD-10-CM | POA: Insufficient documentation

## 2013-05-19 DIAGNOSIS — D638 Anemia in other chronic diseases classified elsewhere: Secondary | ICD-10-CM | POA: Insufficient documentation

## 2013-05-19 MED ORDER — EPOETIN ALFA 10000 UNIT/ML IJ SOLN: 20000.0000 [IU] | INTRAMUSCULAR | Status: DC

## 2013-05-19 MED ORDER — EPOETIN ALFA 20000 UNIT/ML IJ SOLN
INTRAMUSCULAR | Status: AC
  Administered 2013-05-19: 20000 [IU] via SUBCUTANEOUS
  Filled 2013-05-19: qty 1

## 2013-06-03 ENCOUNTER — Encounter (HOSPITAL_COMMUNITY)
Admission: RE | Admit: 2013-06-03 | Discharge: 2013-06-03 | Disposition: A | Discharge status: Home / Self Care | Payer: Medicare Other | Source: Ambulatory Visit | Attending: Nephrology | Admitting: Nephrology

## 2013-06-03 LAB — POCT HEMOGLOBIN-HEMACUE: Hemoglobin: 8.7 g/dL — ABNORMAL LOW (ref 12.0–15.0)

## 2013-06-03 MED ORDER — EPOETIN ALFA 20000 UNIT/ML IJ SOLN
INTRAMUSCULAR | Status: AC
  Filled 2013-06-03: qty 1

## 2013-06-03 MED ORDER — EPOETIN ALFA 10000 UNIT/ML IJ SOLN
20000.0000 [IU] | INTRAMUSCULAR | Status: DC
  Administered 2013-06-03: 11:00:00 20000 [IU] via SUBCUTANEOUS

## 2013-06-04 MED FILL — Epoetin Alfa Inj 20000 Unit/ML: INTRAMUSCULAR | Qty: 1 | Status: AC

## 2013-06-09 ENCOUNTER — Encounter: Payer: Self-pay | Admitting: Gastroenterology

## 2013-06-16 ENCOUNTER — Encounter (HOSPITAL_COMMUNITY)
Admission: RE | Admit: 2013-06-16 | Discharge: 2013-06-16 | Disposition: A | Discharge status: Home / Self Care | Payer: Medicare Other | Source: Ambulatory Visit | Attending: Nephrology | Admitting: Nephrology

## 2013-06-16 DIAGNOSIS — D638 Anemia in other chronic diseases classified elsewhere: Secondary | ICD-10-CM | POA: Insufficient documentation

## 2013-06-16 DIAGNOSIS — N184 Chronic kidney disease, stage 4 (severe): Secondary | ICD-10-CM | POA: Insufficient documentation

## 2013-06-16 LAB — POCT HEMOGLOBIN-HEMACUE: Hemoglobin: 9.3 g/dL — ABNORMAL LOW (ref 12.0–15.0)

## 2013-06-16 MED ORDER — EPOETIN ALFA 20000 UNIT/ML IJ SOLN
INTRAMUSCULAR | Status: AC
  Filled 2013-06-16: qty 1

## 2013-06-16 MED ORDER — EPOETIN ALFA 10000 UNIT/ML IJ SOLN
20000.0000 [IU] | INTRAMUSCULAR | Status: DC
  Administered 2013-06-16: 11:00:00 20000 [IU] via SUBCUTANEOUS

## 2013-06-17 MED FILL — Epoetin Alfa Inj 20000 Unit/ML: INTRAMUSCULAR | Qty: 1 | Status: AC

## 2013-06-28 ENCOUNTER — Ambulatory Visit: Payer: Medicare Other

## 2013-06-28 ENCOUNTER — Ambulatory Visit (INDEPENDENT_AMBULATORY_CARE_PROVIDER_SITE_OTHER): Payer: Medicare Other

## 2013-06-28 DIAGNOSIS — Z23 Encounter for immunization: Secondary | ICD-10-CM

## 2013-07-01 ENCOUNTER — Encounter (HOSPITAL_COMMUNITY)
Admission: RE | Admit: 2013-07-01 | Discharge: 2013-07-01 | Disposition: A | Discharge status: Home / Self Care | Payer: Medicare Other | Source: Ambulatory Visit | Attending: Nephrology | Admitting: Nephrology

## 2013-07-01 MED ORDER — EPOETIN ALFA 20000 UNIT/ML IJ SOLN
INTRAMUSCULAR | Status: AC
  Administered 2013-07-01: 11:00:00 20000 [IU] via SUBCUTANEOUS
  Filled 2013-07-01: qty 1

## 2013-07-01 MED ORDER — EPOETIN ALFA 10000 UNIT/ML IJ SOLN
20000.0000 [IU] | INTRAMUSCULAR | Status: DC
Start: 2013-07-01 — End: 2013-07-02

## 2013-07-02 LAB — IRON AND TIBC
Iron: 112 ug/dL (ref 42–135)
Saturation Ratios: 40 % (ref 20–55)
UIBC: 171 ug/dL (ref 125–400)

## 2013-07-14 ENCOUNTER — Encounter (HOSPITAL_COMMUNITY)
Admission: RE | Admit: 2013-07-14 | Discharge: 2013-07-14 | Disposition: A | Discharge status: Home / Self Care | Payer: Medicare Other | Source: Ambulatory Visit | Attending: Nephrology | Admitting: Nephrology

## 2013-07-14 ENCOUNTER — Ambulatory Visit: Payer: Medicare Other | Admitting: Gastroenterology

## 2013-07-14 DIAGNOSIS — D638 Anemia in other chronic diseases classified elsewhere: Secondary | ICD-10-CM | POA: Insufficient documentation

## 2013-07-14 DIAGNOSIS — N184 Chronic kidney disease, stage 4 (severe): Secondary | ICD-10-CM | POA: Insufficient documentation

## 2013-07-14 LAB — POCT HEMOGLOBIN-HEMACUE: Hemoglobin: 10.1 g/dL — ABNORMAL LOW (ref 12.0–15.0)

## 2013-07-14 MED ORDER — EPOETIN ALFA 20000 UNIT/ML IJ SOLN
INTRAMUSCULAR | Status: AC
  Administered 2013-07-15: 09:00:00
  Filled 2013-07-14: qty 1

## 2013-07-14 MED ORDER — EPOETIN ALFA 10000 UNIT/ML IJ SOLN: 20000.0000 [IU] | INTRAMUSCULAR | Status: DC

## 2013-07-21 ENCOUNTER — Ambulatory Visit: Payer: Medicare Other | Admitting: Gastroenterology

## 2013-07-27 ENCOUNTER — Encounter (HOSPITAL_COMMUNITY)
Admission: RE | Admit: 2013-07-27 | Discharge: 2013-07-27 | Disposition: A | Discharge status: Home / Self Care | Payer: Medicare Other | Source: Ambulatory Visit | Attending: Nephrology | Admitting: Nephrology

## 2013-07-27 LAB — POCT HEMOGLOBIN-HEMACUE: Hemoglobin: 10.8 g/dL — ABNORMAL LOW (ref 12.0–15.0)

## 2013-07-27 MED ORDER — EPOETIN ALFA 20000 UNIT/ML IJ SOLN
INTRAMUSCULAR | Status: AC
  Administered 2013-07-27: 14:00:00 20000 [IU] via SUBCUTANEOUS
  Filled 2013-07-27: qty 1

## 2013-07-27 MED ORDER — EPOETIN ALFA 10000 UNIT/ML IJ SOLN
20000.0000 [IU] | INTRAMUSCULAR | Status: DC
Start: 2013-07-27 — End: 2013-07-28

## 2013-07-28 ENCOUNTER — Encounter (HOSPITAL_COMMUNITY): Payer: Medicare Other

## 2013-08-10 ENCOUNTER — Other Ambulatory Visit (HOSPITAL_COMMUNITY): Payer: Self-pay | Admitting: *Deleted

## 2013-08-11 ENCOUNTER — Encounter (HOSPITAL_COMMUNITY)
Admission: RE | Admit: 2013-08-11 | Discharge: 2013-08-11 | Disposition: A | Discharge status: Home / Self Care | Payer: Medicare Other | Source: Ambulatory Visit | Attending: Nephrology | Admitting: Nephrology

## 2013-08-11 DIAGNOSIS — N184 Chronic kidney disease, stage 4 (severe): Secondary | ICD-10-CM | POA: Insufficient documentation

## 2013-08-11 DIAGNOSIS — D638 Anemia in other chronic diseases classified elsewhere: Secondary | ICD-10-CM | POA: Insufficient documentation

## 2013-08-11 LAB — POCT HEMOGLOBIN-HEMACUE: Hemoglobin: 10.7 g/dL — ABNORMAL LOW (ref 12.0–15.0)

## 2013-08-11 MED ORDER — EPOETIN ALFA 10000 UNIT/ML IJ SOLN: 20000.0000 [IU] | INTRAMUSCULAR | Status: DC

## 2013-08-11 MED ORDER — EPOETIN ALFA 20000 UNIT/ML IJ SOLN
INTRAMUSCULAR | Status: AC
  Administered 2013-08-11: 13:00:00 20000 [IU] via SUBCUTANEOUS
  Filled 2013-08-11: qty 1

## 2013-08-12 ENCOUNTER — Ambulatory Visit: Payer: Medicare Other | Admitting: Gastroenterology

## 2013-08-16 ENCOUNTER — Encounter: Payer: Self-pay | Admitting: Nurse Practitioner

## 2013-08-16 ENCOUNTER — Ambulatory Visit (INDEPENDENT_AMBULATORY_CARE_PROVIDER_SITE_OTHER): Payer: Medicare Other | Admitting: Nurse Practitioner

## 2013-08-16 VITALS — BP 143/88 | HR 77 | Temp 96.9°F | Ht 64.0 in | Wt 149.0 lb

## 2013-08-16 DIAGNOSIS — I10 Essential (primary) hypertension: Secondary | ICD-10-CM

## 2013-08-16 DIAGNOSIS — I872 Venous insufficiency (chronic) (peripheral): Secondary | ICD-10-CM

## 2013-08-16 DIAGNOSIS — G629 Polyneuropathy, unspecified: Secondary | ICD-10-CM

## 2013-08-16 DIAGNOSIS — D649 Anemia, unspecified: Secondary | ICD-10-CM

## 2013-08-16 DIAGNOSIS — G609 Hereditary and idiopathic neuropathy, unspecified: Secondary | ICD-10-CM

## 2013-08-16 DIAGNOSIS — E785 Hyperlipidemia, unspecified: Secondary | ICD-10-CM

## 2013-08-16 DIAGNOSIS — M549 Dorsalgia, unspecified: Secondary | ICD-10-CM

## 2013-08-16 DIAGNOSIS — E119 Type 2 diabetes mellitus without complications: Secondary | ICD-10-CM

## 2013-08-16 DIAGNOSIS — I4891 Unspecified atrial fibrillation: Secondary | ICD-10-CM

## 2013-08-16 LAB — POCT GLYCOSYLATED HEMOGLOBIN (HGB A1C): Hemoglobin A1C: 6.2

## 2013-08-16 MED ORDER — CARVEDILOL 12.5 MG PO TABS: 6.2500 mg | ORAL_TABLET | Freq: Every day | ORAL | Status: DC

## 2013-08-16 MED ORDER — FUROSEMIDE 40 MG PO TABS: 80.0000 mg | ORAL_TABLET | Freq: Every day | ORAL | Status: DC

## 2013-08-16 MED ORDER — OMEPRAZOLE 20 MG PO CPDR: DELAYED_RELEASE_CAPSULE | ORAL | Status: DC

## 2013-08-16 MED ORDER — ATORVASTATIN CALCIUM 40 MG PO TABS: 40.0000 mg | ORAL_TABLET | Freq: Every day | ORAL | Status: DC

## 2013-08-16 MED ORDER — AMITRIPTYLINE HCL 50 MG PO TABS: 50.0000 mg | ORAL_TABLET | Freq: Every day | ORAL | Status: DC

## 2013-08-16 NOTE — Progress Notes (Signed)
Subjective:    Patient ID: Julie Carpenter, female    DOB: 15-Aug-1926, 79 y.o.   MRN: [id number]   Patient here today for folow up - no changes since last visit- no complaints today. Diabetes She presents for her follow-up diabetic visit. She has type 2 diabetes mellitus. No MedicAlert identification noted. The initial diagnosis of diabetes was made 25 years ago. Her disease course has been stable. There are no hypoglycemic associated symptoms. Pertinent negatives for diabetes include no chest pain, no foot paresthesias, no foot ulcerations, no polydipsia, no polyphagia, no polyuria, no visual change, no weakness and no weight loss. There are no hypoglycemic complications. Symptoms are stable. There are no diabetic complications. Risk factors for coronary artery disease include dyslipidemia, hypertension and post-menopausal. Current diabetic treatment includes diet and oral agent (monotherapy). She is compliant with treatment all of the time. Her weight is stable. She is following a diabetic diet. When asked about meal planning, she reported none. She has not had a previous visit with a dietician. She rarely participates in exercise. There is no change in her home blood glucose trend. Her breakfast blood glucose is taken between 7-8 am. Her breakfast blood glucose range is generally 140-180 mg/dl. Her dinner blood glucose range is generally 140-180 mg/dl. Her highest blood glucose is 180-200 mg/dl. Her overall blood glucose range is 140-180 mg/dl. An ACE inhibitor/angiotensin II receptor blocker is being taken. She does not see a podiatrist.Eye exam is current (september 2013).  Hypertension This is a chronic problem. The current episode started more than 1 year ago. The problem is controlled. Pertinent negatives include no chest pain, neck pain, palpitations, peripheral edema or shortness of breath. There are no associated agents to hypertension. Risk factors for coronary artery disease include diabetes  mellitus, dyslipidemia, post-menopausal state and sedentary lifestyle. Past treatments include angiotensin blockers, calcium channel blockers and diuretics. The current treatment provides significant improvement. There are no compliance problems.   Hyperlipidemia This is a chronic problem. The current episode started more than 1 year ago. The problem is controlled. Recent lipid tests were reviewed and are normal. Exacerbating diseases include diabetes. Pertinent negatives include no chest pain or shortness of breath. Current antihyperlipidemic treatment includes statins. The current treatment provides moderate improvement of lipids. Compliance problems include adherence to diet.  Risk factors for coronary artery disease include diabetes mellitus, hypertension, post-menopausal and a sedentary lifestyle.  Back pain amitruptyline- only takes at night- helps a little Atrial fib - new onset at last visit- [place] score of 3- patient was started xeralto which caused her to blee and hgb dropped- now she is only on ASA. Last hgb check was 10.2.  Review of Systems  Constitutional: Negative for weight loss.  Respiratory: Negative for shortness of breath.   Cardiovascular: Negative for chest pain and palpitations.  Endocrine: Negative for polydipsia, polyphagia and polyuria.  Musculoskeletal: Negative for neck pain.  Neurological: Negative for weakness.  All other systems reviewed and are negative.       Objective:   Physical Exam  Constitutional: She is oriented to person, place, and time. She appears well-developed and well-nourished.  HENT:  Nose: Nose normal.  Mouth/Throat: Oropharynx is clear and moist.  Eyes: EOM are normal.  Neck: Trachea normal, normal range of motion and full passive range of motion without pain. Neck supple. No JVD present. Carotid bruit is not present. No thyromegaly present.  Cardiovascular: Normal rate, normal heart sounds and intact distal pulses.  Exam reveals no gallop  and no friction rub.   No murmur heard. Irregular rhythym  Pulmonary/Chest: Effort normal and breath sounds normal.  Abdominal: Soft. Bowel sounds are normal. She exhibits no distension and no mass. There is no tenderness.  Musculoskeletal: Normal range of motion.  Lymphadenopathy:    She has no cervical adenopathy.  Neurological: She is alert and oriented to person, place, and time. She has normal reflexes.  Skin: Skin is warm and dry.  Psychiatric: She has a normal mood and affect. Her behavior is normal. Judgment and thought content normal.   BP 143/88  Pulse 77  Temp(Src) 96.9 F (36.1 C) (Oral)  Ht 5\' 4"  (1.626 m)  Wt 149 lb (67.586 kg)  BMI 25.56 kg/m2 Results for orders placed in visit on 08/16/13  POCT GLYCOSYLATED HEMOGLOBIN (HGB A1C)      Result Value Range   Hemoglobin A1C 6.2%           Assessment & Plan:   1. HTN (hypertension)   2. Hyperlipemia   3. Diabetes   4. Atrial fibrillation   5. Anemia   6. Peripheral neuropathy   7. Venous insufficiency   8. Back pain   9. Hyperlipidemia   10. Hypertension   11. Type II or unspecified type diabetes mellitus without mention of complication, not stated as uncontrolled    Orders Placed This Encounter  Procedures  . CMP14+EGFR  . NMR, lipoprofile  . POCT glycosylated hemoglobin (Hb A1C)   Meds ordered this encounter  Medications  . DISCONTD: furosemide (LASIX) 40 MG tablet    Sig: Take 80 mg by mouth daily.  Marland Kitchen amitriptyline (ELAVIL) 50 MG tablet    Sig: Take 1 tablet (50 mg total) by mouth at bedtime.    Dispense:  90 tablet    Refill:  1    Order Specific Question:  Supervising Provider    Answer:  Ernestina Penna [[id number]]  . atorvastatin (LIPITOR) 40 MG tablet    Sig: Take 1 tablet (40 mg total) by mouth daily.    Dispense:  90 tablet    Refill:  1    Order Specific Question:  Supervising Provider    Answer:  Ernestina Penna [[id number]]  . carvedilol (COREG) 12.5 MG tablet    Sig: Take 0.5 tablets (6.25  mg total) by mouth daily. One daily    Dispense:  90 tablet    Refill:  1    Order Specific Question:  Supervising Provider    Answer:  Ernestina Penna [[id number]]  . furosemide (LASIX) 40 MG tablet    Sig: Take 2 tablets (80 mg total) by mouth daily.    Dispense:  180 tablet    Refill:  1    Order Specific Question:  Supervising Provider    Answer:  Ernestina Penna [[id number]]  . omeprazole (PRILOSEC) 20 MG capsule    Sig: 1 PO EVERY MORNING    Dispense:  90 capsule    Refill:  1    Order Specific Question:  Supervising Provider    Answer:  Deborra Medina    Continue all meds Labs pending Diet and exercise encouraged Health maintenance reviewed Follow up in 3 months  Mary-Margaret Daphine Deutscher, FNP

## 2013-08-16 NOTE — Patient Instructions (Signed)

## 2013-08-17 LAB — NMR, LIPOPROFILE
Cholesterol: 138 mg/dL (ref <200)
LDL Particle Number: 653 nmol/L (ref <1000)
LDL Size: 20.3 nm — ABNORMAL LOW (ref >20.5)
Small LDL Particle Number: 474 nmol/L (ref <=527)
Triglycerides by NMR: 210 mg/dL — ABNORMAL HIGH (ref <150)

## 2013-08-17 LAB — CMP14+EGFR
Albumin/Globulin Ratio: 1.6 (ref 1.1–2.5)
BUN/Creatinine Ratio: 18 (ref 11–26)
BUN: 35 mg/dL — ABNORMAL HIGH (ref 8–27)
CO2: 28 mmol/L (ref 18–29)
Calcium: 10 mg/dL (ref 8.6–10.2)
Chloride: 102 mmol/L (ref 97–108)
GFR calc non Af Amer: 22 mL/min/{1.73_m2} — ABNORMAL LOW (ref >59)
Globulin, Total: 2.5 g/dL (ref 1.5–4.5)
Glucose: 101 mg/dL — ABNORMAL HIGH (ref 65–99)
Potassium: 5.1 mmol/L (ref 3.5–5.2)
Sodium: 142 mmol/L (ref 134–144)
Total Bilirubin: 0.4 mg/dL (ref 0.0–1.2)
Total Protein: 6.6 g/dL (ref 6.0–8.5)

## 2013-08-25 ENCOUNTER — Encounter (HOSPITAL_COMMUNITY)
Admission: RE | Admit: 2013-08-25 | Discharge: 2013-08-25 | Disposition: A | Discharge status: Home / Self Care | Payer: Medicare Other | Source: Ambulatory Visit | Attending: Nephrology | Admitting: Nephrology

## 2013-08-25 MED ORDER — EPOETIN ALFA 20000 UNIT/ML IJ SOLN
INTRAMUSCULAR | Status: AC
  Administered 2013-08-25: 20000 [IU] via SUBCUTANEOUS
  Filled 2013-08-25: qty 1

## 2013-08-25 MED ORDER — EPOETIN ALFA 10000 UNIT/ML IJ SOLN: 20000.0000 [IU] | INTRAMUSCULAR | Status: DC

## 2013-08-26 ENCOUNTER — Encounter (HOSPITAL_COMMUNITY): Payer: Medicare Other

## 2013-09-07 ENCOUNTER — Other Ambulatory Visit (HOSPITAL_COMMUNITY): Payer: Self-pay | Admitting: *Deleted

## 2013-09-08 ENCOUNTER — Encounter (HOSPITAL_COMMUNITY)
Admission: RE | Admit: 2013-09-08 | Discharge: 2013-09-08 | Disposition: A | Discharge status: Home / Self Care | Payer: Medicare Other | Source: Ambulatory Visit | Attending: Nephrology | Admitting: Nephrology

## 2013-09-08 MED ORDER — EPOETIN ALFA 20000 UNIT/ML IJ SOLN
INTRAMUSCULAR | Status: AC
  Administered 2013-09-08: 20000 [IU] via SUBCUTANEOUS
  Filled 2013-09-08: qty 1

## 2013-09-08 MED ORDER — EPOETIN ALFA 10000 UNIT/ML IJ SOLN: 20000.0000 [IU] | INTRAMUSCULAR | Status: DC

## 2013-09-23 ENCOUNTER — Encounter (HOSPITAL_COMMUNITY)
Admission: RE | Admit: 2013-09-23 | Discharge: 2013-09-23 | Disposition: A | Discharge status: Home / Self Care | Payer: Medicare Other | Source: Ambulatory Visit | Attending: Nephrology | Admitting: Nephrology

## 2013-09-23 DIAGNOSIS — N184 Chronic kidney disease, stage 4 (severe): Secondary | ICD-10-CM | POA: Insufficient documentation

## 2013-09-23 DIAGNOSIS — D638 Anemia in other chronic diseases classified elsewhere: Secondary | ICD-10-CM | POA: Insufficient documentation

## 2013-09-23 LAB — FERRITIN: Ferritin: 19 ng/mL (ref 10–291)

## 2013-09-23 LAB — IRON AND TIBC
IRON: 141 ug/dL — AB (ref 42–135)
Saturation Ratios: 49 % (ref 20–55)
TIBC: 287 ug/dL (ref 250–470)
UIBC: 146 ug/dL (ref 125–400)

## 2013-09-23 LAB — POCT HEMOGLOBIN-HEMACUE: Hemoglobin: 10.8 g/dL — ABNORMAL LOW (ref 12.0–15.0)

## 2013-09-23 MED ORDER — EPOETIN ALFA 10000 UNIT/ML IJ SOLN: 20000.0000 [IU] | INTRAMUSCULAR | Status: DC

## 2013-09-23 MED ORDER — EPOETIN ALFA 20000 UNIT/ML IJ SOLN
INTRAMUSCULAR | Status: AC
  Administered 2013-09-23: 20000 [IU] via SUBCUTANEOUS
  Filled 2013-09-23: qty 1

## 2013-10-06 ENCOUNTER — Encounter (HOSPITAL_COMMUNITY)
Admission: RE | Admit: 2013-10-06 | Discharge: 2013-10-06 | Disposition: A | Discharge status: Home / Self Care | Payer: Medicare Other | Source: Ambulatory Visit | Attending: Nephrology | Admitting: Nephrology

## 2013-10-06 LAB — POCT HEMOGLOBIN-HEMACUE: Hemoglobin: 11.2 g/dL — ABNORMAL LOW (ref 12.0–15.0)

## 2013-10-06 MED ORDER — EPOETIN ALFA 20000 UNIT/ML IJ SOLN
INTRAMUSCULAR | Status: AC
  Filled 2013-10-06: qty 1

## 2013-10-06 MED ORDER — EPOETIN ALFA 10000 UNIT/ML IJ SOLN
20000.0000 [IU] | INTRAMUSCULAR | Status: DC
  Administered 2013-10-06: 11:00:00 20000 [IU] via SUBCUTANEOUS

## 2013-10-07 MED FILL — Epoetin Alfa Inj 20000 Unit/ML: INTRAMUSCULAR | Qty: 1 | Status: AC

## 2013-10-21 ENCOUNTER — Encounter (HOSPITAL_COMMUNITY): Payer: Medicare Other

## 2013-10-25 ENCOUNTER — Encounter (HOSPITAL_COMMUNITY): Payer: Medicare Other

## 2013-10-25 ENCOUNTER — Other Ambulatory Visit: Payer: Self-pay

## 2013-10-25 DIAGNOSIS — I10 Essential (primary) hypertension: Secondary | ICD-10-CM

## 2013-10-25 MED ORDER — CARVEDILOL 12.5 MG PO TABS: 6.2500 mg | ORAL_TABLET | Freq: Every day | ORAL | Status: DC

## 2013-11-01 ENCOUNTER — Encounter (HOSPITAL_COMMUNITY)
Admission: RE | Admit: 2013-11-01 | Discharge: 2013-11-01 | Disposition: A | Discharge status: Home / Self Care | Payer: Medicare Other | Source: Ambulatory Visit | Attending: Nephrology | Admitting: Nephrology

## 2013-11-01 DIAGNOSIS — D638 Anemia in other chronic diseases classified elsewhere: Secondary | ICD-10-CM | POA: Insufficient documentation

## 2013-11-01 DIAGNOSIS — N184 Chronic kidney disease, stage 4 (severe): Secondary | ICD-10-CM | POA: Insufficient documentation

## 2013-11-01 LAB — POCT HEMOGLOBIN-HEMACUE
Hemoglobin: 12 g/dL (ref 12.0–15.0)
Hemoglobin: 13.4 g/dL (ref 12.0–15.0)

## 2013-11-01 MED ORDER — EPOETIN ALFA 10000 UNIT/ML IJ SOLN: 20000.0000 [IU] | INTRAMUSCULAR | Status: DC

## 2013-11-15 ENCOUNTER — Ambulatory Visit (INDEPENDENT_AMBULATORY_CARE_PROVIDER_SITE_OTHER): Payer: Medicare Other | Admitting: Nurse Practitioner

## 2013-11-15 ENCOUNTER — Encounter: Payer: Self-pay | Admitting: Nurse Practitioner

## 2013-11-15 VITALS — BP 125/77 | HR 64 | Temp 97.3°F | Ht 64.0 in | Wt 154.0 lb

## 2013-11-15 DIAGNOSIS — E785 Hyperlipidemia, unspecified: Secondary | ICD-10-CM

## 2013-11-15 DIAGNOSIS — G609 Hereditary and idiopathic neuropathy, unspecified: Secondary | ICD-10-CM

## 2013-11-15 DIAGNOSIS — I10 Essential (primary) hypertension: Secondary | ICD-10-CM

## 2013-11-15 DIAGNOSIS — G629 Polyneuropathy, unspecified: Secondary | ICD-10-CM

## 2013-11-15 DIAGNOSIS — I4891 Unspecified atrial fibrillation: Secondary | ICD-10-CM

## 2013-11-15 DIAGNOSIS — M549 Dorsalgia, unspecified: Secondary | ICD-10-CM

## 2013-11-15 DIAGNOSIS — N189 Chronic kidney disease, unspecified: Secondary | ICD-10-CM

## 2013-11-15 DIAGNOSIS — E119 Type 2 diabetes mellitus without complications: Secondary | ICD-10-CM

## 2013-11-15 LAB — POCT GLYCOSYLATED HEMOGLOBIN (HGB A1C): Hemoglobin A1C: 6.9

## 2013-11-15 MED ORDER — OMEPRAZOLE 20 MG PO CPDR: DELAYED_RELEASE_CAPSULE | ORAL | Status: DC

## 2013-11-15 MED ORDER — ATORVASTATIN CALCIUM 40 MG PO TABS: 40.0000 mg | ORAL_TABLET | Freq: Every day | ORAL | Status: DC

## 2013-11-15 MED ORDER — FUROSEMIDE 40 MG PO TABS: 80.0000 mg | ORAL_TABLET | Freq: Every day | ORAL | Status: DC

## 2013-11-15 MED ORDER — CARVEDILOL 12.5 MG PO TABS: 6.2500 mg | ORAL_TABLET | Freq: Every day | ORAL | Status: DC

## 2013-11-15 MED ORDER — GLIMEPIRIDE 2 MG PO TABS: 2.0000 mg | ORAL_TABLET | Freq: Every day | ORAL | Status: DC

## 2013-11-15 MED ORDER — AMITRIPTYLINE HCL 50 MG PO TABS: 50.0000 mg | ORAL_TABLET | Freq: Every day | ORAL | Status: DC

## 2013-11-15 NOTE — Progress Notes (Signed)
Subjective:    Patient ID: Julie Carpenter, female    DOB: Jan 17, 1926, 80 y.o.   MRN: [id number]  Diabetes She presents for her follow-up diabetic visit. She has type 2 diabetes mellitus. No MedicAlert identification noted. The initial diagnosis of diabetes was made 25 years ago. Her disease course has been stable. There are no hypoglycemic associated symptoms. Pertinent negatives for diabetes include no chest pain, no foot paresthesias, no foot ulcerations, no polydipsia, no polyphagia, no polyuria, no visual change, no weakness and no weight loss. There are no hypoglycemic complications. Symptoms are stable. There are no diabetic complications. Risk factors for coronary artery disease include dyslipidemia, hypertension and post-menopausal. Current diabetic treatment includes diet and oral agent (monotherapy). She is compliant with treatment all of the time. Her weight is stable. She is following a diabetic diet. When asked about meal planning, she reported none. She has not had a previous visit with a dietician. She rarely participates in exercise. There is no change in her home blood glucose trend. Her breakfast blood glucose is taken between 7-8 am. Her breakfast blood glucose range is generally 110-130 mg/dl. Her dinner blood glucose range is generally 140-180 mg/dl. Her highest blood glucose is 110-130 mg/dl. An ACE inhibitor/angiotensin II receptor blocker is being taken. She does not see a podiatrist.Eye exam is current (september 2013).  Hypertension This is a chronic problem. The current episode started more than 1 year ago. The problem is controlled. Pertinent negatives include no chest pain, neck pain, palpitations, peripheral edema or shortness of breath. There are no associated agents to hypertension. Risk factors for coronary artery disease include diabetes mellitus, dyslipidemia, post-menopausal state and sedentary lifestyle. Past treatments include angiotensin blockers, calcium channel  blockers and diuretics. The current treatment provides significant improvement. There are no compliance problems.   Hyperlipidemia This is a chronic problem. The current episode started more than 1 year ago. The problem is controlled. Recent lipid tests were reviewed and are normal. Exacerbating diseases include diabetes. Pertinent negatives include no chest pain or shortness of breath. Current antihyperlipidemic treatment includes statins. The current treatment provides moderate improvement of lipids. Compliance problems include adherence to diet.  Risk factors for coronary artery disease include diabetes mellitus, hypertension, post-menopausal and a sedentary lifestyle.  Back pain amitriptyline- only takes at night- helps a little Atrial fib Patient takes a baby asa daily- her [place] score was 3- she is doing well- no c/o palpitations Review of Systems  Constitutional: Negative for weight loss.  Respiratory: Negative for shortness of breath.   Cardiovascular: Negative for chest pain and palpitations.  Endocrine: Negative for polydipsia, polyphagia and polyuria.  Musculoskeletal: Negative for neck pain.  Neurological: Negative for weakness.  All other systems reviewed and are negative.       Objective:   Physical Exam  Constitutional: She is oriented to person, place, and time. She appears well-developed and well-nourished.  HENT:  Nose: Nose normal.  Mouth/Throat: Oropharynx is clear and moist.  Eyes: EOM are normal.  Neck: Trachea normal, normal range of motion and full passive range of motion without pain. Neck supple. No JVD present. Carotid bruit is not present. No thyromegaly present.  Cardiovascular: Normal rate, normal heart sounds and intact distal pulses.  Exam reveals no gallop and no friction rub.   No murmur heard. Irregular rhythym  Pulmonary/Chest: Effort normal and breath sounds normal.  Abdominal: Soft. Bowel sounds are normal. She exhibits no distension and no mass.  There is no tenderness.  Musculoskeletal: Normal  range of motion.  Lymphadenopathy:    She has no cervical adenopathy.  Neurological: She is alert and oriented to person, place, and time. She has normal reflexes.  Skin: Skin is warm and dry.  Psychiatric: She has a normal mood and affect. Her behavior is normal. Judgment and thought content normal.   BP 125/77  Pulse 64  Temp(Src) 97.3 F (36.3 C) (Oral)  Ht 5' 4"  (1.626 m)  Wt 154 lb (69.854 kg)  BMI 26.42 kg/m2  Results for orders placed in visit on 11/15/13  POCT GLYCOSYLATED HEMOGLOBIN (HGB A1C)      Result Value Ref Range   Hemoglobin A1C 6.9%           Assessment & Plan:   1. HTN (hypertension)   2. Hyperlipemia   3. Diabetes   4. Peripheral neuropathy   5. CKD (chronic kidney disease)   6. Atrial fibrillation   7. Back pain   8. Hyperlipidemia   9. Hypertension   10. Type II or unspecified type diabetes mellitus without mention of complication, not stated as uncontrolled    Orders Placed This Encounter  Procedures  . CMP14+EGFR  . NMR, lipoprofile  . POCT glycosylated hemoglobin (Hb A1C)   Meds ordered this encounter  Medications  . amitriptyline (ELAVIL) 50 MG tablet    Sig: Take 1 tablet (50 mg total) by mouth at bedtime.    Dispense:  90 tablet    Refill:  1    Order Specific Question:  Supervising Provider    Answer:  Chipper Herb [[id number]]  . atorvastatin (LIPITOR) 40 MG tablet    Sig: Take 1 tablet (40 mg total) by mouth daily.    Dispense:  90 tablet    Refill:  1    Order Specific Question:  Supervising Provider    Answer:  Chipper Herb [[id number]]  . carvedilol (COREG) 12.5 MG tablet    Sig: Take 0.5 tablets (6.25 mg total) by mouth daily. One daily    Dispense:  90 tablet    Refill:  1    Order Specific Question:  Supervising Provider    Answer:  Chipper Herb [[id number]]  . furosemide (LASIX) 40 MG tablet    Sig: Take 2 tablets (80 mg total) by mouth daily.    Dispense:  180 tablet     Refill:  1    Order Specific Question:  Supervising Provider    Answer:  Chipper Herb [[id number]]  . glimepiride (AMARYL) 2 MG tablet    Sig: Take 1 tablet (2 mg total) by mouth daily before breakfast.    Dispense:  90 tablet    Refill:  1    Order Specific Question:  Supervising Provider    Answer:  Chipper Herb [[id number]]  . omeprazole (PRILOSEC) 20 MG capsule    Sig: 1 PO EVERY MORNING    Dispense:  90 capsule    Refill:  1    Order Specific Question:  Supervising Provider    Answer:  Chipper Herb [[id number]]    Labs pending Health maintenance reviewed Diet and exercise encouraged Continue all meds Follow up  In 3 months   [place], FNP

## 2013-11-15 NOTE — Patient Instructions (Signed)

## 2013-11-16 ENCOUNTER — Encounter (HOSPITAL_COMMUNITY): Payer: Medicare Other

## 2013-11-17 ENCOUNTER — Encounter (HOSPITAL_COMMUNITY)
Admission: RE | Admit: 2013-11-17 | Discharge: 2013-11-17 | Disposition: A | Discharge status: Home / Self Care | Payer: Medicare Other | Source: Ambulatory Visit | Attending: Nephrology | Admitting: Nephrology

## 2013-11-17 ENCOUNTER — Other Ambulatory Visit: Payer: Self-pay | Admitting: Nurse Practitioner

## 2013-11-17 DIAGNOSIS — D638 Anemia in other chronic diseases classified elsewhere: Secondary | ICD-10-CM | POA: Insufficient documentation

## 2013-11-17 DIAGNOSIS — I10 Essential (primary) hypertension: Secondary | ICD-10-CM

## 2013-11-17 DIAGNOSIS — N184 Chronic kidney disease, stage 4 (severe): Secondary | ICD-10-CM | POA: Insufficient documentation

## 2013-11-17 LAB — CMP14+EGFR
ALBUMIN: 3.9 g/dL (ref 3.5–4.7)
ALT: 17 IU/L (ref 0–32)
AST: 24 IU/L (ref 0–40)
Albumin/Globulin Ratio: 1.7 (ref 1.1–2.5)
Alkaline Phosphatase: 115 IU/L (ref 39–117)
BUN / CREAT RATIO: 19 (ref 11–26)
BUN: 40 mg/dL — AB (ref 8–27)
CHLORIDE: 103 mmol/L (ref 97–108)
CO2: 25 mmol/L (ref 18–29)
Calcium: 9.8 mg/dL (ref 8.7–10.3)
Creatinine, Ser: 2.1 mg/dL — ABNORMAL HIGH (ref 0.57–1.00)
GFR calc Af Amer: 24 mL/min/{1.73_m2} — ABNORMAL LOW (ref >59)
GFR calc non Af Amer: 21 mL/min/{1.73_m2} — ABNORMAL LOW (ref >59)
Globulin, Total: 2.3 g/dL (ref 1.5–4.5)
Glucose: 115 mg/dL — ABNORMAL HIGH (ref 65–99)
Potassium: 5.1 mmol/L (ref 3.5–5.2)
Sodium: 141 mmol/L (ref 134–144)
Total Bilirubin: 0.5 mg/dL (ref 0.0–1.2)
Total Protein: 6.2 g/dL (ref 6.0–8.5)

## 2013-11-17 LAB — NMR, LIPOPROFILE
Cholesterol: 153 mg/dL (ref <200)
HDL Cholesterol by NMR: 37 mg/dL — ABNORMAL LOW (ref >=40)
HDL PARTICLE NUMBER: 26.9 umol/L — AB (ref >=30.5)
LDL PARTICLE NUMBER: 1031 nmol/L — AB (ref <1000)
LDL Size: 20.5 nm — ABNORMAL LOW (ref >20.5)
LDLC SERPL CALC-MCNC: 84 mg/dL (ref <100)
LP-IR Score: 76 — ABNORMAL HIGH (ref <=45)
Small LDL Particle Number: 373 nmol/L (ref <=527)
Triglycerides by NMR: 158 mg/dL — ABNORMAL HIGH (ref <150)

## 2013-11-17 LAB — HEMOGLOBIN AND HEMATOCRIT, BLOOD
HCT: 33.6 % — ABNORMAL LOW (ref 36.0–46.0)
Hemoglobin: 11 g/dL — ABNORMAL LOW (ref 12.0–15.0)

## 2013-11-17 MED ORDER — EPOETIN ALFA 10000 UNIT/ML IJ SOLN
20000.0000 [IU] | INTRAMUSCULAR | Status: DC
  Administered 2013-11-17: 20000 [IU] via SUBCUTANEOUS

## 2013-11-17 MED ORDER — EPOETIN ALFA 20000 UNIT/ML IJ SOLN: 20000.0000 [IU] | INTRAMUSCULAR | Status: DC

## 2013-11-17 MED ORDER — EPOETIN ALFA 10000 UNIT/ML IJ SOLN
INTRAMUSCULAR | Status: AC
  Filled 2013-11-17: qty 2

## 2013-11-17 MED ORDER — CARVEDILOL 12.5 MG PO TABS: 12.5000 mg | ORAL_TABLET | Freq: Every day | ORAL | Status: DC

## 2013-11-17 NOTE — Progress Notes (Signed)
Results for Julie Carpenter, Julie Carpenter (MRN [id number]) as of 11/17/2013 13:51  Ref. Range 11/17/2013 13:00  Hemoglobin Latest Range: 12.0-15.0 g/dL 11.0 (L)  HCT Latest Range: 36.0-46.0 % 33.6 (L)

## 2013-12-08 ENCOUNTER — Encounter (HOSPITAL_COMMUNITY)
Admission: RE | Admit: 2013-12-08 | Discharge: 2013-12-08 | Disposition: A | Discharge status: Home / Self Care | Payer: Medicare Other | Source: Ambulatory Visit | Attending: Nephrology | Admitting: Nephrology

## 2013-12-08 DIAGNOSIS — D638 Anemia in other chronic diseases classified elsewhere: Secondary | ICD-10-CM | POA: Insufficient documentation

## 2013-12-08 DIAGNOSIS — N184 Chronic kidney disease, stage 4 (severe): Secondary | ICD-10-CM | POA: Insufficient documentation

## 2013-12-08 LAB — HEMOGLOBIN AND HEMATOCRIT, BLOOD
HCT: 29.9 % — ABNORMAL LOW (ref 36.0–46.0)
HEMOGLOBIN: 9.6 g/dL — AB (ref 12.0–15.0)

## 2013-12-08 LAB — IRON AND TIBC
Iron: 71 ug/dL (ref 42–135)
SATURATION RATIOS: 26 % (ref 20–55)
TIBC: 278 ug/dL (ref 250–470)
UIBC: 207 ug/dL (ref 125–400)

## 2013-12-08 MED ORDER — EPOETIN ALFA 10000 UNIT/ML IJ SOLN
20000.0000 [IU] | Freq: Once | INTRAMUSCULAR | Status: AC
  Administered 2013-12-08: 20000 [IU] via SUBCUTANEOUS
  Filled 2013-12-08: qty 2

## 2013-12-08 NOTE — Progress Notes (Signed)
Results for Julie Carpenter, ROSSA (MRN [id number]) as of 12/08/2013 13:47  Ref. Range 12/08/2013 13:00  Hemoglobin Latest Range: 12.0-15.0 g/dL 9.6 (L)  HCT Latest Range: 36.0-46.0 % 29.9 (L)  20000 units Procrit given subcutaneous

## 2013-12-09 LAB — FERRITIN: FERRITIN: 30 ng/mL (ref 10–291)

## 2013-12-29 ENCOUNTER — Encounter (HOSPITAL_COMMUNITY)
Admission: RE | Admit: 2013-12-29 | Discharge: 2013-12-29 | Disposition: A | Discharge status: Home / Self Care | Payer: Medicare Other | Source: Ambulatory Visit | Attending: Nephrology | Admitting: Nephrology

## 2013-12-29 LAB — HEMOGLOBIN AND HEMATOCRIT, BLOOD
HEMATOCRIT: 31.6 % — AB (ref 36.0–46.0)
Hemoglobin: 10.4 g/dL — ABNORMAL LOW (ref 12.0–15.0)

## 2013-12-29 MED ORDER — EPOETIN ALFA 10000 UNIT/ML IJ SOLN
INTRAMUSCULAR | Status: AC
  Filled 2013-12-29: qty 2

## 2013-12-29 MED ORDER — EPOETIN ALFA 10000 UNIT/ML IJ SOLN
20000.0000 [IU] | INTRAMUSCULAR | Status: DC
  Administered 2013-12-29: 20000 [IU] via SUBCUTANEOUS

## 2013-12-29 NOTE — Progress Notes (Signed)
Results for MALISSIE, MUSGRAVE (MRN [id number]) as of 12/29/2013 11:18  Ref. Range 12/29/2013 11:03  Hemoglobin Latest Range: 12.0-15.0 g/dL 10.4 (L)  HCT Latest Range: 36.0-46.0 % 31.6 (L)  Prior to procrit being given. R Dio Giller RN

## 2013-12-29 NOTE — Progress Notes (Signed)
Here for labs and procrit it indicated. Dx CKD stage 4. 585.4

## 2014-01-19 ENCOUNTER — Encounter (HOSPITAL_COMMUNITY)
Admission: RE | Admit: 2014-01-19 | Discharge: 2014-01-19 | Disposition: A | Discharge status: Home / Self Care | Payer: Medicare Other | Source: Ambulatory Visit | Attending: Nephrology | Admitting: Nephrology

## 2014-01-19 DIAGNOSIS — N184 Chronic kidney disease, stage 4 (severe): Secondary | ICD-10-CM | POA: Insufficient documentation

## 2014-01-19 DIAGNOSIS — D638 Anemia in other chronic diseases classified elsewhere: Secondary | ICD-10-CM | POA: Insufficient documentation

## 2014-01-19 LAB — HEMOGLOBIN AND HEMATOCRIT, BLOOD
HCT: 29.3 % — ABNORMAL LOW (ref 36.0–46.0)
Hemoglobin: 9.5 g/dL — ABNORMAL LOW (ref 12.0–15.0)

## 2014-01-19 MED ORDER — EPOETIN ALFA 10000 UNIT/ML IJ SOLN
INTRAMUSCULAR | Status: AC
  Filled 2014-01-19: qty 2

## 2014-01-19 MED ORDER — EPOETIN ALFA 10000 UNIT/ML IJ SOLN
20000.0000 [IU] | INTRAMUSCULAR | Status: DC
  Administered 2014-01-19: 20000 [IU] via SUBCUTANEOUS

## 2014-01-19 NOTE — Progress Notes (Signed)
Results for Julie Carpenter, Julie Carpenter (MRN [id number]) as of 01/19/2014 11:36  Ref. Range 01/19/2014 10:55  Hemoglobin Latest Range: 12.0-15.0 g/dL 9.5 (L)  HCT Latest Range: 36.0-46.0 % 29.3 (L)  Procrit 20,000 units given this visit 102/66, HR 72 Sat 100%

## 2014-02-09 ENCOUNTER — Encounter (HOSPITAL_COMMUNITY)
Admission: RE | Admit: 2014-02-09 | Discharge: 2014-02-09 | Disposition: A | Discharge status: Home / Self Care | Payer: Medicare Other | Source: Ambulatory Visit | Attending: Nephrology | Admitting: Nephrology

## 2014-02-09 DIAGNOSIS — D631 Anemia in chronic kidney disease: Secondary | ICD-10-CM | POA: Insufficient documentation

## 2014-02-09 DIAGNOSIS — N039 Chronic nephritic syndrome with unspecified morphologic changes: Principal | ICD-10-CM

## 2014-02-09 DIAGNOSIS — N184 Chronic kidney disease, stage 4 (severe): Secondary | ICD-10-CM | POA: Insufficient documentation

## 2014-02-09 LAB — HEMOGLOBIN AND HEMATOCRIT, BLOOD
HCT: 33.9 % — ABNORMAL LOW (ref 36.0–46.0)
Hemoglobin: 11 g/dL — ABNORMAL LOW (ref 12.0–15.0)

## 2014-02-09 MED ORDER — EPOETIN ALFA 10000 UNIT/ML IJ SOLN
20000.0000 [IU] | Freq: Once | INTRAMUSCULAR | Status: AC
  Administered 2014-02-09: 20000 [IU] via SUBCUTANEOUS

## 2014-02-09 MED ORDER — EPOETIN ALFA 10000 UNIT/ML IJ SOLN
INTRAMUSCULAR | Status: AC
  Filled 2014-02-09: qty 2

## 2014-02-23 ENCOUNTER — Ambulatory Visit (INDEPENDENT_AMBULATORY_CARE_PROVIDER_SITE_OTHER): Payer: Medicare Other | Admitting: Nurse Practitioner

## 2014-02-23 ENCOUNTER — Encounter: Payer: Self-pay | Admitting: Nurse Practitioner

## 2014-02-23 VITALS — BP 122/70 | HR 65 | Temp 98.0°F | Ht 64.0 in | Wt 164.2 lb

## 2014-02-23 DIAGNOSIS — M810 Age-related osteoporosis without current pathological fracture: Secondary | ICD-10-CM

## 2014-02-23 DIAGNOSIS — I10 Essential (primary) hypertension: Secondary | ICD-10-CM

## 2014-02-23 DIAGNOSIS — S51802A Unspecified open wound of left forearm, initial encounter: Secondary | ICD-10-CM

## 2014-02-23 DIAGNOSIS — D649 Anemia, unspecified: Secondary | ICD-10-CM

## 2014-02-23 DIAGNOSIS — G609 Hereditary and idiopathic neuropathy, unspecified: Secondary | ICD-10-CM

## 2014-02-23 DIAGNOSIS — R600 Localized edema: Secondary | ICD-10-CM | POA: Insufficient documentation

## 2014-02-23 DIAGNOSIS — S51809A Unspecified open wound of unspecified forearm, initial encounter: Secondary | ICD-10-CM

## 2014-02-23 DIAGNOSIS — Z23 Encounter for immunization: Secondary | ICD-10-CM

## 2014-02-23 DIAGNOSIS — E785 Hyperlipidemia, unspecified: Secondary | ICD-10-CM

## 2014-02-23 DIAGNOSIS — I4891 Unspecified atrial fibrillation: Secondary | ICD-10-CM

## 2014-02-23 DIAGNOSIS — K219 Gastro-esophageal reflux disease without esophagitis: Secondary | ICD-10-CM | POA: Insufficient documentation

## 2014-02-23 DIAGNOSIS — M549 Dorsalgia, unspecified: Secondary | ICD-10-CM

## 2014-02-23 DIAGNOSIS — I872 Venous insufficiency (chronic) (peripheral): Secondary | ICD-10-CM

## 2014-02-23 DIAGNOSIS — G629 Polyneuropathy, unspecified: Secondary | ICD-10-CM

## 2014-02-23 DIAGNOSIS — R609 Edema, unspecified: Secondary | ICD-10-CM

## 2014-02-23 DIAGNOSIS — E119 Type 2 diabetes mellitus without complications: Secondary | ICD-10-CM

## 2014-02-23 LAB — POCT GLYCOSYLATED HEMOGLOBIN (HGB A1C): Hemoglobin A1C: 6.5

## 2014-02-23 MED ORDER — GLIMEPIRIDE 2 MG PO TABS: 2.0000 mg | ORAL_TABLET | Freq: Every day | ORAL | Status: DC

## 2014-02-23 MED ORDER — FUROSEMIDE 40 MG PO TABS: 80.0000 mg | ORAL_TABLET | Freq: Every day | ORAL | Status: DC

## 2014-02-23 MED ORDER — ATORVASTATIN CALCIUM 40 MG PO TABS: 40.0000 mg | ORAL_TABLET | Freq: Every day | ORAL | Status: DC

## 2014-02-23 MED ORDER — CARVEDILOL 12.5 MG PO TABS: 12.5000 mg | ORAL_TABLET | Freq: Every day | ORAL | Status: DC

## 2014-02-23 MED ORDER — AMITRIPTYLINE HCL 50 MG PO TABS: 50.0000 mg | ORAL_TABLET | Freq: Every day | ORAL | Status: DC

## 2014-02-23 MED ORDER — OMEPRAZOLE 20 MG PO CPDR: DELAYED_RELEASE_CAPSULE | ORAL | Status: DC

## 2014-02-23 NOTE — Progress Notes (Signed)
Subjective:    Patient ID: Julie Carpenter, female    DOB: Dec 14, 1925, 80 y.o.   MRN: [id number]  Patient here today for follow up of chronic medical problems.   Diabetes She presents for her follow-up diabetic visit. She has type 2 diabetes mellitus. No MedicAlert identification noted. The initial diagnosis of diabetes was made 25 years ago. Her disease course has been stable. There are no hypoglycemic associated symptoms. Pertinent negatives for diabetes include no chest pain, no foot paresthesias, no foot ulcerations, no polydipsia, no polyphagia, no polyuria, no visual change, no weakness and no weight loss. There are no hypoglycemic complications. Symptoms are stable. There are no diabetic complications. Risk factors for coronary artery disease include dyslipidemia, hypertension and post-menopausal. Current diabetic treatment includes diet and oral agent (monotherapy). She is compliant with treatment all of the time. Her weight is stable. She is following a diabetic diet. When asked about meal planning, she reported none. She has not had a previous visit with a dietician. She rarely participates in exercise. There is no change in her home blood glucose trend. Her breakfast blood glucose is taken between 7-8 am. Her breakfast blood glucose range is generally 110-130 mg/dl. Her dinner blood glucose range is generally 140-180 mg/dl. Her highest blood glucose is 110-130 mg/dl. An ACE inhibitor/angiotensin II receptor blocker is being taken. She does not see a podiatrist.Eye exam is current (september 2013).  Hypertension This is a chronic problem. The current episode started more than 1 year ago. The problem is controlled. Pertinent negatives include no chest pain, neck pain, palpitations, peripheral edema or shortness of breath. There are no associated agents to hypertension. Risk factors for coronary artery disease include diabetes mellitus, dyslipidemia, post-menopausal state and sedentary lifestyle.  Past treatments include angiotensin blockers, calcium channel blockers and diuretics. The current treatment provides significant improvement. There are no compliance problems.   Hyperlipidemia This is a chronic problem. The current episode started more than 1 year ago. The problem is controlled. Recent lipid tests were reviewed and are normal. Exacerbating diseases include diabetes. Pertinent negatives include no chest pain or shortness of breath. Current antihyperlipidemic treatment includes statins. The current treatment provides moderate improvement of lipids. Compliance problems include adherence to diet.  Risk factors for coronary artery disease include diabetes mellitus, hypertension, post-menopausal and a sedentary lifestyle.  Back pain amitriptyline- only takes at night- helps a little Atrial fib Patient takes a baby asa daily- her [place] score was 3- she is doing well- no c/o palpitations Anemia Has been stable- no medds- wll recheck today- No c/o fatigue. Insomnia Amitriptyline helps her sleep well and feel rested in AM GERD Omeprazole keeps symptoms under control PERIPHERAL EDEMA Lasix keeps swelling under control- if has swelling she su=its and props legs up and it resolves  * cut left arm on rusty nail Monday in her garden  Review of Systems  Constitutional: Negative for weight loss.  Respiratory: Negative for shortness of breath.   Cardiovascular: Negative for chest pain and palpitations.  Endocrine: Negative for polydipsia, polyphagia and polyuria.  Musculoskeletal: Negative for neck pain.  Neurological: Negative for weakness.  All other systems reviewed and are negative.      Objective:   Physical Exam  Constitutional: She is oriented to person, place, and time. She appears well-developed and well-nourished.  HENT:  Nose: Nose normal.  Mouth/Throat: Oropharynx is clear and moist.  Eyes: EOM are normal.  Neck: Trachea normal, normal range of motion and full passive  range of  motion without pain. Neck supple. No JVD present. Carotid bruit is not present. No thyromegaly present.  Cardiovascular: Normal rate, normal heart sounds and intact distal pulses.  Exam reveals no gallop and no friction rub.   No murmur heard. Irregular rhythym  Pulmonary/Chest: Effort normal and breath sounds normal.  Abdominal: Soft. Bowel sounds are normal. She exhibits no distension and no mass. There is no tenderness.  Musculoskeletal: Normal range of motion.  Lymphadenopathy:    She has no cervical adenopathy.  Neurological: She is alert and oriented to person, place, and time. She has normal reflexes.  Skin: Skin is warm and dry.  Superficial u shaped laceration to left upper forearm with surrounding echymosis- no sign of infection  Psychiatric: She has a normal mood and affect. Her behavior is normal. Judgment and thought content normal.   BP 122/70  Pulse 65  Temp(Src) 98 F (36.7 C) (Oral)  Ht 5' 4"  (1.626 m)  Wt 164 lb 3.2 oz (74.481 kg)  BMI 28.17 kg/m2  Results for orders placed in visit on 02/23/14  POCT GLYCOSYLATED HEMOGLOBIN (HGB A1C)      Result Value Ref Range   Hemoglobin A1C 6.5           Assessment & Plan:   1. Peripheral neuropathy   2. Osteoporosis   3. Venous insufficiency   4. Atrial fibrillation   5. Anemia   6. Back pain   7. Hyperlipidemia   8. Hypertension   9. Type II or unspecified type diabetes mellitus without mention of complication, not stated as uncontrolled   10. Peripheral edema   11. GERD (gastroesophageal reflux disease)   12. Open wound of left forearm    Orders Placed This Encounter  Procedures  . CMP14+EGFR  . NMR, lipoprofile  . POCT glycosylated hemoglobin (Hb A1C)   Meds ordered this encounter  Medications  . amitriptyline (ELAVIL) 50 MG tablet    Sig: Take 1 tablet (50 mg total) by mouth at bedtime.    Dispense:  90 tablet    Refill:  1    Order Specific Question:  Supervising Provider    Answer:   Chipper Herb [[id number]]  . atorvastatin (LIPITOR) 40 MG tablet    Sig: Take 1 tablet (40 mg total) by mouth daily.    Dispense:  90 tablet    Refill:  1    Order Specific Question:  Supervising Provider    Answer:  Chipper Herb [[id number]]  . carvedilol (COREG) 12.5 MG tablet    Sig: Take 1 tablet (12.5 mg total) by mouth daily. One daily    Dispense:  90 tablet    Refill:  1    Order Specific Question:  Supervising Provider    Answer:  Chipper Herb [[id number]]  . glimepiride (AMARYL) 2 MG tablet    Sig: Take 1 tablet (2 mg total) by mouth daily before breakfast.    Dispense:  90 tablet    Refill:  1    Order Specific Question:  Supervising Provider    Answer:  Chipper Herb [[id number]]  . furosemide (LASIX) 40 MG tablet    Sig: Take 2 tablets (80 mg total) by mouth daily.    Dispense:  180 tablet    Refill:  1    Order Specific Question:  Supervising Provider    Answer:  Chipper Herb [[id number]]  . omeprazole (PRILOSEC) 20 MG capsule    Sig: 1 PO EVERY MORNING  Dispense:  90 capsule    Refill:  1    Order Specific Question:  Supervising Provider    Answer:  Chipper Herb [[id number]]   Keep wound clean and dry - watch for signs of infection Td given today Labs pending Health maintenance reviewed Diet and exercise encouraged Continue all meds Follow up  In 3 months   [place], FNP

## 2014-02-23 NOTE — Patient Instructions (Signed)

## 2014-02-24 ENCOUNTER — Encounter (HOSPITAL_COMMUNITY)
Admission: RE | Admit: 2014-02-24 | Discharge: 2014-02-24 | Disposition: A | Discharge status: Home / Self Care | Payer: Medicare Other | Source: Ambulatory Visit | Attending: Nephrology | Admitting: Nephrology

## 2014-02-24 ENCOUNTER — Encounter (HOSPITAL_COMMUNITY): Payer: Self-pay

## 2014-02-24 LAB — HEMOGLOBIN AND HEMATOCRIT, BLOOD
HEMATOCRIT: 30.5 % — AB (ref 36.0–46.0)
Hemoglobin: 9.9 g/dL — ABNORMAL LOW (ref 12.0–15.0)

## 2014-02-24 MED ORDER — EPOETIN ALFA 10000 UNIT/ML IJ SOLN
20000.0000 [IU] | INTRAMUSCULAR | Status: DC
  Administered 2014-02-24: 20000 [IU] via SUBCUTANEOUS
  Filled 2014-02-24: qty 2

## 2014-02-24 NOTE — Progress Notes (Signed)
Results for ITZAE, MCCURDY (MRN [id number]) as of 02/24/2014 12:48  Ref. Range 02/24/2014 11:23  Hemoglobin Latest Range: 12.0-15.0 g/dL 9.9 (L)  HCT Latest Range: 36.0-46.0 % 30.5 (L)  Procrit 20,000 units SQ given this clinic visit

## 2014-02-25 LAB — CMP14+EGFR
ALBUMIN: 4 g/dL (ref 3.5–4.7)
ALT: 10 IU/L (ref 0–32)
AST: 21 IU/L (ref 0–40)
Albumin/Globulin Ratio: 1.5 (ref 1.1–2.5)
Alkaline Phosphatase: 106 IU/L (ref 39–117)
BILIRUBIN TOTAL: 0.7 mg/dL (ref 0.0–1.2)
BUN / CREAT RATIO: 14 (ref 11–26)
BUN: 30 mg/dL — AB (ref 8–27)
CO2: 22 mmol/L (ref 18–29)
CREATININE: 2.09 mg/dL — AB (ref 0.57–1.00)
Calcium: 9.3 mg/dL (ref 8.7–10.3)
Chloride: 105 mmol/L (ref 97–108)
GFR, EST AFRICAN AMERICAN: 24 mL/min/{1.73_m2} — AB (ref >59)
GFR, EST NON AFRICAN AMERICAN: 21 mL/min/{1.73_m2} — AB (ref >59)
GLOBULIN, TOTAL: 2.6 g/dL (ref 1.5–4.5)
Glucose: 85 mg/dL (ref 65–99)
Potassium: 4.6 mmol/L (ref 3.5–5.2)
Sodium: 144 mmol/L (ref 134–144)
TOTAL PROTEIN: 6.6 g/dL (ref 6.0–8.5)

## 2014-02-25 LAB — NMR, LIPOPROFILE
CHOLESTEROL: 150 mg/dL (ref 100–199)
HDL Cholesterol by NMR: 27 mg/dL — ABNORMAL LOW (ref >39)
HDL PARTICLE NUMBER: 24.3 umol/L — AB (ref >=30.5)
LDL Particle Number: 978 nmol/L (ref <1000)
LDL Size: 20 nm (ref >20.5)
LDLC SERPL CALC-MCNC: 71 mg/dL (ref 0–99)
LP-IR SCORE: 84 — AB (ref <=45)
Small LDL Particle Number: 606 nmol/L — ABNORMAL HIGH (ref <=527)
TRIGLYCERIDES BY NMR: 260 mg/dL — AB (ref 0–149)

## 2014-03-09 ENCOUNTER — Other Ambulatory Visit (HOSPITAL_COMMUNITY): Payer: Medicare Other

## 2014-03-09 ENCOUNTER — Ambulatory Visit (HOSPITAL_COMMUNITY): Payer: Medicare Other

## 2014-03-10 ENCOUNTER — Encounter (HOSPITAL_COMMUNITY)
Admission: RE | Admit: 2014-03-10 | Discharge: 2014-03-10 | Disposition: A | Discharge status: Home / Self Care | Payer: Medicare Other | Source: Ambulatory Visit | Attending: Nephrology | Admitting: Nephrology

## 2014-03-10 DIAGNOSIS — N184 Chronic kidney disease, stage 4 (severe): Secondary | ICD-10-CM | POA: Insufficient documentation

## 2014-03-10 DIAGNOSIS — D638 Anemia in other chronic diseases classified elsewhere: Secondary | ICD-10-CM | POA: Insufficient documentation

## 2014-03-10 LAB — IRON AND TIBC
Iron: 97 ug/dL (ref 42–135)
Saturation Ratios: 35 % (ref 20–55)
TIBC: 274 ug/dL (ref 250–470)
UIBC: 177 ug/dL (ref 125–400)

## 2014-03-10 LAB — HEMOGLOBIN AND HEMATOCRIT, BLOOD
HCT: 33.4 % — ABNORMAL LOW (ref 36.0–46.0)
Hemoglobin: 10.7 g/dL — ABNORMAL LOW (ref 12.0–15.0)

## 2014-03-10 LAB — FERRITIN: Ferritin: 17 ng/mL (ref 10–291)

## 2014-03-10 MED ORDER — EPOETIN ALFA 10000 UNIT/ML IJ SOLN
20000.0000 [IU] | INTRAMUSCULAR | Status: DC
  Administered 2014-03-10: 20000 [IU] via SUBCUTANEOUS
  Filled 2014-03-10: qty 2

## 2014-03-24 ENCOUNTER — Encounter (HOSPITAL_COMMUNITY)
Admission: RE | Admit: 2014-03-24 | Discharge: 2014-03-24 | Disposition: A | Discharge status: Home / Self Care | Payer: Medicare Other | Source: Ambulatory Visit | Attending: Nephrology | Admitting: Nephrology

## 2014-03-24 DIAGNOSIS — D638 Anemia in other chronic diseases classified elsewhere: Secondary | ICD-10-CM | POA: Diagnosis not present

## 2014-03-24 LAB — HEMOGLOBIN AND HEMATOCRIT, BLOOD
HCT: 34 % — ABNORMAL LOW (ref 36.0–46.0)
HEMOGLOBIN: 11 g/dL — AB (ref 12.0–15.0)

## 2014-03-24 MED ORDER — EPOETIN ALFA 10000 UNIT/ML IJ SOLN
20000.0000 [IU] | INTRAMUSCULAR | Status: DC
  Administered 2014-03-24: 20000 [IU] via SUBCUTANEOUS

## 2014-03-24 MED ORDER — EPOETIN ALFA 20000 UNIT/ML IJ SOLN
20000.0000 [IU] | INTRAMUSCULAR | Status: DC
  Filled 2014-03-24: qty 1

## 2014-03-24 MED ORDER — EPOETIN ALFA 10000 UNIT/ML IJ SOLN
INTRAMUSCULAR | Status: AC
  Filled 2014-03-24: qty 2

## 2014-03-24 NOTE — Progress Notes (Signed)
Results for ANALEE, MONTEE (MRN [id number]) as of 03/24/2014 12:51  Ref. Range 03/24/2014 11:32  Hemoglobin Latest Range: 12.0-15.0 g/dL 11.0 (L)  HCT Latest Range: 36.0-46.0 % 34.0 (L)   Procrit 20,000 units SQ given per MD order.

## 2014-04-07 ENCOUNTER — Encounter (HOSPITAL_COMMUNITY)
Admission: RE | Admit: 2014-04-07 | Discharge: 2014-04-07 | Disposition: A | Discharge status: Home / Self Care | Payer: Medicare Other | Source: Ambulatory Visit | Attending: Nephrology | Admitting: Nephrology

## 2014-04-07 DIAGNOSIS — D638 Anemia in other chronic diseases classified elsewhere: Secondary | ICD-10-CM | POA: Diagnosis not present

## 2014-04-07 LAB — HEMOGLOBIN AND HEMATOCRIT, BLOOD
HCT: 34.9 % — ABNORMAL LOW (ref 36.0–46.0)
HEMOGLOBIN: 11.4 g/dL — AB (ref 12.0–15.0)

## 2014-04-07 NOTE — Progress Notes (Signed)
Today's hh faxed to Dr Mercy Moore. hgb 11.4. Called office, spoke with Corky Sox, nurse for Dr Mercy Moore. Instructed to hold today's dose procrit.

## 2014-04-21 ENCOUNTER — Encounter (HOSPITAL_COMMUNITY): Payer: Self-pay

## 2014-04-21 ENCOUNTER — Encounter (HOSPITAL_COMMUNITY)
Admission: RE | Admit: 2014-04-21 | Discharge: 2014-04-21 | Disposition: A | Discharge status: Home / Self Care | Payer: Medicare Other | Source: Ambulatory Visit | Attending: Nephrology | Admitting: Nephrology

## 2014-04-21 DIAGNOSIS — I129 Hypertensive chronic kidney disease with stage 1 through stage 4 chronic kidney disease, or unspecified chronic kidney disease: Secondary | ICD-10-CM | POA: Insufficient documentation

## 2014-04-21 DIAGNOSIS — N039 Chronic nephritic syndrome with unspecified morphologic changes: Principal | ICD-10-CM

## 2014-04-21 DIAGNOSIS — D631 Anemia in chronic kidney disease: Secondary | ICD-10-CM | POA: Diagnosis present

## 2014-04-21 DIAGNOSIS — N184 Chronic kidney disease, stage 4 (severe): Secondary | ICD-10-CM | POA: Insufficient documentation

## 2014-04-21 LAB — HEMOGLOBIN AND HEMATOCRIT, BLOOD
HCT: 35 % — ABNORMAL LOW (ref 36.0–46.0)
Hemoglobin: 11.3 g/dL — ABNORMAL LOW (ref 12.0–15.0)

## 2014-04-21 MED ORDER — EPOETIN ALFA 10000 UNIT/ML IJ SOLN
INTRAMUSCULAR | Status: AC
  Filled 2014-04-21: qty 2

## 2014-04-21 MED ORDER — EPOETIN ALFA 10000 UNIT/ML IJ SOLN
20000.0000 [IU] | Freq: Once | INTRAMUSCULAR | Status: AC
  Administered 2014-04-21: 20000 [IU] via SUBCUTANEOUS
  Filled 2014-04-21: qty 10

## 2014-04-21 NOTE — Progress Notes (Signed)
Results for Julie Carpenter, Julie Carpenter (MRN [id number]) as of 04/21/2014 12:09  Procrit 20,000 units given in divided doses as indicated per order. Pt. Tolerated well.  Ref. Range 04/21/2014 11:00  Hemoglobin Latest Range: 12.0-15.0 g/dL 11.3 (L)  HCT Latest Range: 36.0-46.0 % 35.0 (L)

## 2014-04-29 DIAGNOSIS — Z0289 Encounter for other administrative examinations: Secondary | ICD-10-CM

## 2014-05-05 ENCOUNTER — Encounter (HOSPITAL_COMMUNITY)
Admission: RE | Admit: 2014-05-05 | Discharge: 2014-05-05 | Disposition: A | Discharge status: Home / Self Care | Payer: Medicare Other | Source: Ambulatory Visit | Attending: Nephrology | Admitting: Nephrology

## 2014-05-05 DIAGNOSIS — D631 Anemia in chronic kidney disease: Secondary | ICD-10-CM | POA: Diagnosis not present

## 2014-05-05 LAB — HEMOGLOBIN AND HEMATOCRIT, BLOOD
HEMATOCRIT: 34.6 % — AB (ref 36.0–46.0)
Hemoglobin: 11.2 g/dL — ABNORMAL LOW (ref 12.0–15.0)

## 2014-05-05 MED ORDER — EPOETIN ALFA 10000 UNIT/ML IJ SOLN
20000.0000 [IU] | INTRAMUSCULAR | Status: DC
  Administered 2014-05-05: 20000 [IU] via SUBCUTANEOUS
  Filled 2014-05-05: qty 2

## 2014-05-05 NOTE — Progress Notes (Signed)
Procrit 20,000 units given this visit Results for Julie Carpenter, Julie Carpenter (MRN [id number]) as of 05/05/2014 11:17  Ref. Range 05/05/2014 11:05  Hemoglobin Latest Range: 12.0-15.0 g/dL 11.2 (L)  HCT Latest Range: 36.0-46.0 % 34.6 (L)

## 2014-05-19 ENCOUNTER — Inpatient Hospital Stay (HOSPITAL_COMMUNITY): Admission: RE | Admit: 2014-05-19 | Payer: Medicare Other | Source: Ambulatory Visit

## 2014-05-20 ENCOUNTER — Encounter (HOSPITAL_COMMUNITY)
Admission: RE | Admit: 2014-05-20 | Discharge: 2014-05-20 | Disposition: A | Discharge status: Home / Self Care | Payer: Medicare Other | Source: Ambulatory Visit | Attending: Nephrology | Admitting: Nephrology

## 2014-05-20 DIAGNOSIS — D631 Anemia in chronic kidney disease: Secondary | ICD-10-CM | POA: Diagnosis present

## 2014-05-20 DIAGNOSIS — N184 Chronic kidney disease, stage 4 (severe): Secondary | ICD-10-CM | POA: Insufficient documentation

## 2014-05-20 DIAGNOSIS — N039 Chronic nephritic syndrome with unspecified morphologic changes: Principal | ICD-10-CM

## 2014-05-20 LAB — HEMOGLOBIN AND HEMATOCRIT, BLOOD
HCT: 36.4 % (ref 36.0–46.0)
Hemoglobin: 11.8 g/dL — ABNORMAL LOW (ref 12.0–15.0)

## 2014-05-20 MED ORDER — EPOETIN ALFA 10000 UNIT/ML IJ SOLN: 20000.0000 [IU] | INTRAMUSCULAR | Status: DC

## 2014-05-20 NOTE — Progress Notes (Signed)
Results for SYDNA, BRODOWSKI (MRN [id number]) as of 05/20/2014 11:52  Ref. Range 05/20/2014 10:30  Hemoglobin Latest Range: 12.0-15.0 g/dL 11.8 (L)  HCT Latest Range: 36.0-46.0 % 36.4  No Procrit given this visit will repeat H/H in 2 weeks

## 2014-05-30 ENCOUNTER — Encounter: Payer: Self-pay | Admitting: Nurse Practitioner

## 2014-05-30 ENCOUNTER — Ambulatory Visit (INDEPENDENT_AMBULATORY_CARE_PROVIDER_SITE_OTHER): Payer: Medicare Other | Admitting: Nurse Practitioner

## 2014-05-30 VITALS — BP 143/104 | HR 54 | Temp 97.4°F | Ht 64.0 in | Wt 162.6 lb

## 2014-05-30 DIAGNOSIS — E785 Hyperlipidemia, unspecified: Secondary | ICD-10-CM

## 2014-05-30 DIAGNOSIS — K219 Gastro-esophageal reflux disease without esophagitis: Secondary | ICD-10-CM

## 2014-05-30 DIAGNOSIS — I482 Chronic atrial fibrillation, unspecified: Secondary | ICD-10-CM

## 2014-05-30 DIAGNOSIS — R600 Localized edema: Secondary | ICD-10-CM

## 2014-05-30 DIAGNOSIS — M545 Low back pain, unspecified: Secondary | ICD-10-CM

## 2014-05-30 DIAGNOSIS — E010 Iodine-deficiency related diffuse (endemic) goiter: Secondary | ICD-10-CM

## 2014-05-30 DIAGNOSIS — E049 Nontoxic goiter, unspecified: Secondary | ICD-10-CM

## 2014-05-30 DIAGNOSIS — Z6827 Body mass index (BMI) 27.0-27.9, adult: Secondary | ICD-10-CM

## 2014-05-30 DIAGNOSIS — I10 Essential (primary) hypertension: Secondary | ICD-10-CM

## 2014-05-30 DIAGNOSIS — N182 Chronic kidney disease, stage 2 (mild): Secondary | ICD-10-CM

## 2014-05-30 DIAGNOSIS — I4891 Unspecified atrial fibrillation: Secondary | ICD-10-CM

## 2014-05-30 DIAGNOSIS — R609 Edema, unspecified: Secondary | ICD-10-CM

## 2014-05-30 DIAGNOSIS — D500 Iron deficiency anemia secondary to blood loss (chronic): Secondary | ICD-10-CM

## 2014-05-30 DIAGNOSIS — E119 Type 2 diabetes mellitus without complications: Secondary | ICD-10-CM

## 2014-05-30 LAB — POCT GLYCOSYLATED HEMOGLOBIN (HGB A1C)

## 2014-05-30 MED ORDER — ATORVASTATIN CALCIUM 40 MG PO TABS: 40.0000 mg | ORAL_TABLET | Freq: Every day | ORAL | Status: DC

## 2014-05-30 MED ORDER — OMEPRAZOLE 20 MG PO CPDR: DELAYED_RELEASE_CAPSULE | ORAL | Status: DC

## 2014-05-30 MED ORDER — GLIMEPIRIDE 2 MG PO TABS: 2.0000 mg | ORAL_TABLET | Freq: Every day | ORAL | Status: DC

## 2014-05-30 MED ORDER — CARVEDILOL 12.5 MG PO TABS: 12.5000 mg | ORAL_TABLET | Freq: Every day | ORAL | Status: DC

## 2014-05-30 MED ORDER — FUROSEMIDE 40 MG PO TABS: 80.0000 mg | ORAL_TABLET | Freq: Every day | ORAL | Status: DC

## 2014-05-30 MED ORDER — AMITRIPTYLINE HCL 50 MG PO TABS: 50.0000 mg | ORAL_TABLET | Freq: Every day | ORAL | Status: DC

## 2014-05-30 NOTE — Patient Instructions (Signed)

## 2014-05-30 NOTE — Addendum Note (Signed)
Addended by: Earlene Plater on: 05/30/2014 11:58 AM   Modules accepted: Orders

## 2014-05-30 NOTE — Progress Notes (Addendum)
Subjective:    Patient ID: Julie Carpenter, female    DOB: April 16, 1926, 80 y.o.   MRN: [id number]  Patient here today for follow up of chronic medical problems. Patient ran out of diabetic meds 4 days ago.   Diabetes She presents for her follow-up diabetic visit. She has type 2 diabetes mellitus. No MedicAlert identification noted. The initial diagnosis of diabetes was made 25 years ago. Her disease course has been stable. There are no hypoglycemic associated symptoms. Pertinent negatives for diabetes include no chest pain, no foot paresthesias, no foot ulcerations, no polydipsia, no polyphagia, no polyuria, no visual change, no weakness and no weight loss. There are no hypoglycemic complications. Symptoms are stable. There are no diabetic complications. Risk factors for coronary artery disease include dyslipidemia, hypertension and post-menopausal. Current diabetic treatment includes diet and oral agent (monotherapy). She is compliant with treatment all of the time. Her weight is stable. She is following a diabetic diet. When asked about meal planning, she reported none. She has not had a previous visit with a dietician. She rarely participates in exercise. There is no change in her home blood glucose trend. Her breakfast blood glucose is taken between 7-8 am. Her breakfast blood glucose range is generally 110-130 mg/dl. Her dinner blood glucose range is generally 140-180 mg/dl. Her highest blood glucose is 110-130 mg/dl. An ACE inhibitor/angiotensin II receptor blocker is being taken. She does not see a podiatrist.Eye exam is current (september 2013).  Hypertension This is a chronic problem. The current episode started more than 1 year ago. The problem is controlled. Pertinent negatives include no chest pain, neck pain, palpitations, peripheral edema or shortness of breath. There are no associated agents to hypertension. Risk factors for coronary artery disease include diabetes mellitus, dyslipidemia,  post-menopausal state and sedentary lifestyle. Past treatments include angiotensin blockers, calcium channel blockers and diuretics. The current treatment provides significant improvement. There are no compliance problems.   Hyperlipidemia This is a chronic problem. The current episode started more than 1 year ago. The problem is controlled. Recent lipid tests were reviewed and are normal. Exacerbating diseases include diabetes. Pertinent negatives include no chest pain or shortness of breath. Current antihyperlipidemic treatment includes statins. The current treatment provides moderate improvement of lipids. Compliance problems include adherence to diet.  Risk factors for coronary artery disease include diabetes mellitus, hypertension, post-menopausal and a sedentary lifestyle.  Back pain amitriptyline- only takes at night- helps a little Atrial fib Patient takes a baby asa daily- her [place] score was 3- she is doing well- no c/o palpitations Review of Systems  Constitutional: Negative for weight loss.  Respiratory: Negative for shortness of breath.   Cardiovascular: Negative for chest pain and palpitations.  Endocrine: Negative for polydipsia, polyphagia and polyuria.  Musculoskeletal: Negative for neck pain.  Neurological: Negative for weakness.  All other systems reviewed and are negative.      Objective:   Physical Exam  Constitutional: She is oriented to person, place, and time. She appears well-developed and well-nourished.  HENT:  Nose: Nose normal.  Mouth/Throat: Oropharynx is clear and moist.  Eyes: EOM are normal.  Neck: Trachea normal, normal range of motion and full passive range of motion without pain. Neck supple. No JVD present. Carotid bruit is not present. No thyromegaly present.  Cardiovascular: Normal rate, normal heart sounds and intact distal pulses.  Exam reveals no gallop and no friction rub.   No murmur heard. Irregular rhythym  Pulmonary/Chest: Effort normal and  breath sounds normal.  Abdominal: Soft. Bowel sounds are normal. She exhibits no distension and no mass. There is no tenderness.  Musculoskeletal: Normal range of motion.  Lymphadenopathy:    She has no cervical adenopathy.  Neurological: She is alert and oriented to person, place, and time. She has normal reflexes.  Skin: Skin is warm and dry.  Psychiatric: She has a normal mood and affect. Her behavior is normal. Judgment and thought content normal.   BP 143/104  Pulse 54  Temp(Src) 97.4 F (36.3 C) (Oral)  Ht 5' 4"  (1.626 m)  Wt 162 lb 9.6 oz (73.755 kg)  BMI 27.90 kg/m2   Results for orders placed in visit on 05/30/14  POCT GLYCOSYLATED HEMOGLOBIN (HGB A1C)      Result Value Ref Range   Hemoglobin A1C 6.9%           Assessment & Plan:   1. Essential hypertension Low sodium diet - CMP14+EGFR - carvedilol (COREG) 12.5 MG tablet; Take 1 tablet (12.5 mg total) by mouth daily. One daily  Dispense: 90 tablet; Refill: 1  2. Hyperlipemia Watch fats in diet - NMR, lipoprofile  3. Gastroesophageal reflux disease without esophagitis Do not eat 2 hours prior to bedtime - omeprazole (PRILOSEC) 20 MG capsule; 1 PO EVERY MORNING  Dispense: 90 capsule; Refill: 1  4. CKD (chronic kidney disease), stage 2 (mild) Keep follow up with kidney specialist  5. Chronic atrial fibrillation  6. Iron deficiency anemia due to chronic blood loss  7. Thyromegaly  8. Bilateral low back pain without sciatica - amitriptyline (ELAVIL) 50 MG tablet; Take 1 tablet (50 mg total) by mouth at bedtime.  Dispense: 90 tablet; Refill: 1  9. Hyperlipidemia - atorvastatin (LIPITOR) 40 MG tablet; Take 1 tablet (40 mg total) by mouth daily.  Dispense: 90 tablet; Refill: 1  10. Peripheral edema - furosemide (LASIX) 40 MG tablet; Take 2 tablets (80 mg total) by mouth daily.  Dispense: 180 tablet; Refill: 1  11. Type II or unspecified type diabetes mellitus without mention of complication, not stated as  uncontrolled Watch carbs-  POCT glycosylated hemoglobin (Hb A1C) - glimepiride (AMARYL) 2 MG tablet; Take 1 tablet (2 mg total) by mouth daily before breakfast.  Dispense: 90 tablet; Refill: 1 - POCT UA - Microalbumin  12. BMI 27.0-27.9,adult Discussed diet and exercise for person with BMI >25 Will recheck weight in 3-6 months\   Labs pending Health maintenance reviewed Diet and exercise encouraged Continue all meds Follow up  In 3 months   [place], FNP

## 2014-06-03 ENCOUNTER — Other Ambulatory Visit: Payer: Self-pay | Admitting: Nurse Practitioner

## 2014-06-03 LAB — CMP14+EGFR
ALT: 17 IU/L (ref 0–32)
AST: 27 IU/L (ref 0–40)
Albumin/Globulin Ratio: 1.4 (ref 1.1–2.5)
Albumin: 4 g/dL (ref 3.5–4.7)
Alkaline Phosphatase: 132 IU/L — ABNORMAL HIGH (ref 39–117)
BUN/Creatinine Ratio: 16 (ref 11–26)
BUN: 28 mg/dL — ABNORMAL HIGH (ref 8–27)
CALCIUM: 9.8 mg/dL (ref 8.7–10.3)
CO2: 23 mmol/L (ref 18–29)
Chloride: 102 mmol/L (ref 97–108)
Creatinine, Ser: 1.74 mg/dL — ABNORMAL HIGH (ref 0.57–1.00)
GFR calc Af Amer: 30 mL/min/{1.73_m2} — ABNORMAL LOW (ref >59)
GFR calc non Af Amer: 26 mL/min/{1.73_m2} — ABNORMAL LOW (ref >59)
GLUCOSE: 191 mg/dL — AB (ref 65–99)
Globulin, Total: 2.9 g/dL (ref 1.5–4.5)
POTASSIUM: 4.4 mmol/L (ref 3.5–5.2)
Sodium: 142 mmol/L (ref 134–144)
Total Bilirubin: 0.6 mg/dL (ref 0.0–1.2)
Total Protein: 6.9 g/dL (ref 6.0–8.5)

## 2014-06-03 LAB — NMR, LIPOPROFILE
Cholesterol: 173 mg/dL (ref 100–199)
HDL CHOLESTEROL BY NMR: 32 mg/dL — AB (ref >39)
HDL Particle Number: 24.9 umol/L — ABNORMAL LOW (ref >=30.5)
LDL Particle Number: 1137 nmol/L — ABNORMAL HIGH (ref <1000)
LDL Size: 19.7 nm (ref >20.5)
LDLC SERPL CALC-MCNC: 77 mg/dL (ref 0–99)
LP-IR Score: 89 — ABNORMAL HIGH (ref <=45)
Small LDL Particle Number: 606 nmol/L — ABNORMAL HIGH (ref <=527)
Triglycerides by NMR: 322 mg/dL — ABNORMAL HIGH (ref 0–149)

## 2014-06-03 MED ORDER — FENOFIBRATE 145 MG PO TABS: 145.0000 mg | ORAL_TABLET | Freq: Every day | ORAL | Status: DC

## 2014-06-08 ENCOUNTER — Encounter (HOSPITAL_COMMUNITY)
Admission: RE | Admit: 2014-06-08 | Discharge: 2014-06-08 | Disposition: A | Discharge status: Home / Self Care | Payer: Medicare Other | Source: Ambulatory Visit | Attending: Nephrology | Admitting: Nephrology

## 2014-06-08 DIAGNOSIS — D631 Anemia in chronic kidney disease: Secondary | ICD-10-CM | POA: Diagnosis not present

## 2014-06-08 LAB — HEMOGLOBIN AND HEMATOCRIT, BLOOD
HEMATOCRIT: 32.5 % — AB (ref 36.0–46.0)
HEMOGLOBIN: 10.6 g/dL — AB (ref 12.0–15.0)

## 2014-06-08 MED ORDER — EPOETIN ALFA 10000 UNIT/ML IJ SOLN
INTRAMUSCULAR | Status: AC
  Filled 2014-06-08: qty 2

## 2014-06-08 MED ORDER — EPOETIN ALFA 10000 UNIT/ML IJ SOLN
20000.0000 [IU] | Freq: Once | INTRAMUSCULAR | Status: AC
  Administered 2014-06-08: 20000 [IU] via SUBCUTANEOUS

## 2014-06-08 NOTE — Progress Notes (Signed)
Results for ADAM, DEMARY (MRN [id number]) as of 06/08/2014 11:53  Ref. Range 05/30/2014 10:04 06/08/2014 11:15  Hemoglobin Latest Range: 12.0-15.0 g/dL  10.6 (L)  HCT Latest Range: 36.0-46.0 %  32.5 (L)    Procrit 20,000 units SQ given per MD order. Injections given in divided doses in bilateral upper arms per pharmacy recommendation. Pt tolerated well.

## 2014-06-22 ENCOUNTER — Encounter (HOSPITAL_COMMUNITY)
Admission: RE | Admit: 2014-06-22 | Discharge: 2014-06-22 | Disposition: A | Discharge status: Home / Self Care | Payer: Medicare Other | Source: Ambulatory Visit | Attending: Nephrology | Admitting: Nephrology

## 2014-06-22 DIAGNOSIS — D631 Anemia in chronic kidney disease: Secondary | ICD-10-CM | POA: Insufficient documentation

## 2014-06-22 DIAGNOSIS — N184 Chronic kidney disease, stage 4 (severe): Secondary | ICD-10-CM | POA: Insufficient documentation

## 2014-06-22 LAB — IRON AND TIBC
IRON: 90 ug/dL (ref 42–135)
SATURATION RATIOS: 36 % (ref 20–55)
TIBC: 251 ug/dL (ref 250–470)
UIBC: 161 ug/dL (ref 125–400)

## 2014-06-22 LAB — HEMOGLOBIN AND HEMATOCRIT, BLOOD
HCT: 34.9 % — ABNORMAL LOW (ref 36.0–46.0)
Hemoglobin: 11.5 g/dL — ABNORMAL LOW (ref 12.0–15.0)

## 2014-06-22 LAB — FERRITIN: Ferritin: 30 ng/mL (ref 10–291)

## 2014-06-23 NOTE — Progress Notes (Signed)
Results for Julie Carpenter, Julie Carpenter (MRN [id number]) as of 06/23/2014 11:26  Ref. Range 06/22/2014 11:00  Iron Latest Range: 42-135 ug/dL 90  UIBC Latest Range: 125-400 ug/dL 161  TIBC Latest Range: 250-470 ug/dL 251  Saturation Ratios Latest Range: 20-55 % 36  Ferritin Latest Range: 10-291 ng/mL 30  Hemoglobin Latest Range: 12.0-15.0 g/dL 11.5 (L)  HCT Latest Range: 36.0-46.0 % 34.9 (L)   No injection given per MD parameters.

## 2014-07-06 ENCOUNTER — Encounter (HOSPITAL_COMMUNITY)
Admission: RE | Admit: 2014-07-06 | Discharge: 2014-07-06 | Disposition: A | Discharge status: Home / Self Care | Payer: Medicare Other | Source: Ambulatory Visit | Attending: Nephrology | Admitting: Nephrology

## 2014-07-06 ENCOUNTER — Encounter (HOSPITAL_COMMUNITY): Payer: Self-pay

## 2014-07-06 ENCOUNTER — Other Ambulatory Visit: Payer: Self-pay | Admitting: Nurse Practitioner

## 2014-07-06 DIAGNOSIS — N184 Chronic kidney disease, stage 4 (severe): Secondary | ICD-10-CM | POA: Diagnosis not present

## 2014-07-06 LAB — HEMOGLOBIN AND HEMATOCRIT, BLOOD
HCT: 35.2 % — ABNORMAL LOW (ref 36.0–46.0)
HEMOGLOBIN: 11.5 g/dL — AB (ref 12.0–15.0)

## 2014-07-06 NOTE — Progress Notes (Signed)
Results for Julie Carpenter, Julie Carpenter (MRN [id number]) as of 07/06/2014 11:39 H&H prior to procrit injection. No medication given today due to lab values.   Ref. Range 07/06/2014 11:00  Hemoglobin Latest Range: 12.0-15.0 g/dL 11.5 (L)  HCT Latest Range: 36.0-46.0 % 35.2 (L)

## 2014-07-20 ENCOUNTER — Encounter (HOSPITAL_COMMUNITY)
Admission: RE | Admit: 2014-07-20 | Discharge: 2014-07-20 | Disposition: A | Discharge status: Home / Self Care | Payer: Medicare Other | Source: Ambulatory Visit | Attending: Nephrology | Admitting: Nephrology

## 2014-07-20 ENCOUNTER — Encounter (HOSPITAL_COMMUNITY): Payer: Self-pay

## 2014-07-20 DIAGNOSIS — D631 Anemia in chronic kidney disease: Secondary | ICD-10-CM | POA: Insufficient documentation

## 2014-07-20 DIAGNOSIS — N184 Chronic kidney disease, stage 4 (severe): Secondary | ICD-10-CM | POA: Insufficient documentation

## 2014-07-20 LAB — HEMOGLOBIN AND HEMATOCRIT, BLOOD
HEMATOCRIT: 34.5 % — AB (ref 36.0–46.0)
Hemoglobin: 11.4 g/dL — ABNORMAL LOW (ref 12.0–15.0)

## 2014-07-20 MED ORDER — EPOETIN ALFA 10000 UNIT/ML IJ SOLN
INTRAMUSCULAR | Status: AC
  Filled 2014-07-20: qty 2

## 2014-07-20 MED ORDER — EPOETIN ALFA 10000 UNIT/ML IJ SOLN
20000.0000 [IU] | INTRAMUSCULAR | Status: DC
  Administered 2014-07-20: 20000 [IU] via SUBCUTANEOUS

## 2014-07-20 NOTE — Progress Notes (Signed)
Results for Julie Carpenter, Julie Carpenter (MRN [id number]) as of 07/20/2014 16:35 Procrit given as indicated. Return visit in 2 weeks  Ref. Range 07/20/2014 10:45  Hemoglobin Latest Range: 12.0-15.0 g/dL 11.4 (L)  HCT Latest Range: 36.0-46.0 % 34.5 (L)

## 2014-08-03 ENCOUNTER — Encounter (HOSPITAL_COMMUNITY)
Admission: RE | Admit: 2014-08-03 | Discharge: 2014-08-03 | Disposition: A | Discharge status: Home / Self Care | Payer: Medicare Other | Source: Ambulatory Visit | Attending: Nephrology | Admitting: Nephrology

## 2014-08-03 ENCOUNTER — Encounter (HOSPITAL_COMMUNITY): Payer: Self-pay

## 2014-08-03 DIAGNOSIS — N184 Chronic kidney disease, stage 4 (severe): Secondary | ICD-10-CM | POA: Diagnosis not present

## 2014-08-03 LAB — HEMOGLOBIN AND HEMATOCRIT, BLOOD
HCT: 34.3 % — ABNORMAL LOW (ref 36.0–46.0)
Hemoglobin: 11.3 g/dL — ABNORMAL LOW (ref 12.0–15.0)

## 2014-08-03 MED ORDER — EPOETIN ALFA 10000 UNIT/ML IJ SOLN
20000.0000 [IU] | INTRAMUSCULAR | Status: DC
  Administered 2014-08-03: 20000 [IU] via SUBCUTANEOUS

## 2014-08-03 MED ORDER — EPOETIN ALFA 10000 UNIT/ML IJ SOLN
INTRAMUSCULAR | Status: AC
  Filled 2014-08-03: qty 2

## 2014-08-03 NOTE — Progress Notes (Signed)
Results for AIMAR, SHREWSBURY (MRN [id number]) as of 08/03/2014 16:16  Ref. Range 08/03/2014 10:55  Hemoglobin Latest Range: 12.0-15.0 g/dL 11.3 (L)  HCT Latest Range: 36.0-46.0 % 34.3 (L)

## 2014-08-17 ENCOUNTER — Encounter (HOSPITAL_COMMUNITY)
Admission: RE | Admit: 2014-08-17 | Discharge: 2014-08-17 | Disposition: A | Discharge status: Home / Self Care | Payer: Medicare Other | Source: Ambulatory Visit | Attending: Nephrology | Admitting: Nephrology

## 2014-08-17 DIAGNOSIS — D638 Anemia in other chronic diseases classified elsewhere: Secondary | ICD-10-CM | POA: Diagnosis present

## 2014-08-17 DIAGNOSIS — N184 Chronic kidney disease, stage 4 (severe): Secondary | ICD-10-CM | POA: Insufficient documentation

## 2014-08-17 LAB — HEMOGLOBIN AND HEMATOCRIT, BLOOD
HCT: 35.7 % — ABNORMAL LOW (ref 36.0–46.0)
Hemoglobin: 11.2 g/dL — ABNORMAL LOW (ref 12.0–15.0)

## 2014-08-17 MED ORDER — EPOETIN ALFA 10000 UNIT/ML IJ SOLN
INTRAMUSCULAR | Status: AC
  Filled 2014-08-17: qty 2

## 2014-08-17 MED ORDER — EPOETIN ALFA 10000 UNIT/ML IJ SOLN
20000.0000 [IU] | Freq: Once | INTRAMUSCULAR | Status: AC
  Administered 2014-08-17: 20000 [IU] via SUBCUTANEOUS

## 2014-08-31 ENCOUNTER — Encounter (HOSPITAL_COMMUNITY)
Admission: RE | Admit: 2014-08-31 | Discharge: 2014-08-31 | Disposition: A | Discharge status: Home / Self Care | Payer: Medicare Other | Source: Ambulatory Visit | Attending: Nephrology | Admitting: Nephrology

## 2014-08-31 DIAGNOSIS — D638 Anemia in other chronic diseases classified elsewhere: Secondary | ICD-10-CM | POA: Diagnosis not present

## 2014-08-31 LAB — HEMOGLOBIN AND HEMATOCRIT, BLOOD
HCT: 36.9 % (ref 36.0–46.0)
HEMOGLOBIN: 11.7 g/dL — AB (ref 12.0–15.0)

## 2014-08-31 MED ORDER — EPOETIN ALFA 20000 UNIT/ML IJ SOLN: 20000.0000 [IU] | INTRAMUSCULAR | Status: DC

## 2014-08-31 NOTE — Progress Notes (Signed)
Results for Julie Carpenter, Julie Carpenter (MRN [id number]) as of 08/31/2014 15:09  Ref. Range 08/31/2014 11:35  Hemoglobin Latest Range: 12.0-15.0 g/dL 11.7 (L)  HCT Latest Range: 36.0-46.0 % 36.9  Procrit held this visit will F/U in 2 weeks

## 2014-09-05 ENCOUNTER — Ambulatory Visit: Payer: Medicare Other | Admitting: Nurse Practitioner

## 2014-09-14 ENCOUNTER — Encounter (HOSPITAL_COMMUNITY)
Admission: RE | Admit: 2014-09-14 | Discharge: 2014-09-14 | Disposition: A | Discharge status: Home / Self Care | Payer: Medicare Other | Source: Ambulatory Visit | Attending: Nephrology | Admitting: Nephrology

## 2014-09-14 DIAGNOSIS — D638 Anemia in other chronic diseases classified elsewhere: Secondary | ICD-10-CM | POA: Insufficient documentation

## 2014-09-14 DIAGNOSIS — N184 Chronic kidney disease, stage 4 (severe): Secondary | ICD-10-CM | POA: Insufficient documentation

## 2014-09-14 LAB — IRON AND TIBC
IRON: 70 ug/dL (ref 42–145)
SATURATION RATIOS: 28 % (ref 20–55)
TIBC: 249 ug/dL — ABNORMAL LOW (ref 250–470)
UIBC: 179 ug/dL (ref 125–400)

## 2014-09-14 LAB — HEMOGLOBIN AND HEMATOCRIT, BLOOD
HCT: 36.5 % (ref 36.0–46.0)
Hemoglobin: 11.7 g/dL — ABNORMAL LOW (ref 12.0–15.0)

## 2014-09-14 LAB — FERRITIN: Ferritin: 53 ng/mL (ref 10–291)

## 2014-09-15 NOTE — Progress Notes (Signed)
Results for Julie Carpenter, Julie Carpenter (MRN [id number]) as of 09/15/2014 11:33  Ref. Range 09/14/2014 12:05  Iron Latest Range: 42-145 ug/dL 70  UIBC Latest Range: 125-400 ug/dL 179  TIBC Latest Range: 250-470 ug/dL 249 (L)  Saturation Ratios Latest Range: 20-55 % 28  Ferritin Latest Range: 10-291 ng/mL 53  Hemoglobin Latest Range: 12.0-15.0 g/dL 11.7 (L)  HCT Latest Range: 36.0-46.0 % 36.5

## 2014-09-28 ENCOUNTER — Encounter (HOSPITAL_COMMUNITY)
Admission: RE | Admit: 2014-09-28 | Discharge: 2014-09-28 | Disposition: A | Discharge status: Home / Self Care | Payer: Medicare Other | Source: Ambulatory Visit | Attending: Nephrology | Admitting: Nephrology

## 2014-09-28 DIAGNOSIS — D638 Anemia in other chronic diseases classified elsewhere: Secondary | ICD-10-CM | POA: Diagnosis not present

## 2014-09-28 LAB — HEMOGLOBIN AND HEMATOCRIT, BLOOD
HEMATOCRIT: 34.9 % — AB (ref 36.0–46.0)
Hemoglobin: 11.3 g/dL — ABNORMAL LOW (ref 12.0–15.0)

## 2014-09-28 MED ORDER — EPOETIN ALFA 20000 UNIT/ML IJ SOLN
20000.0000 [IU] | INTRAMUSCULAR | Status: DC
  Administered 2014-09-28: 20000 [IU] via SUBCUTANEOUS

## 2014-09-28 MED ORDER — EPOETIN ALFA 20000 UNIT/ML IJ SOLN
INTRAMUSCULAR | Status: AC
  Filled 2014-09-28: qty 1

## 2014-10-12 ENCOUNTER — Encounter (HOSPITAL_COMMUNITY)
Admission: RE | Admit: 2014-10-12 | Discharge: 2014-10-12 | Disposition: A | Discharge status: Home / Self Care | Payer: Medicare Other | Source: Ambulatory Visit | Attending: Nephrology | Admitting: Nephrology

## 2014-10-12 ENCOUNTER — Encounter (HOSPITAL_COMMUNITY): Payer: Self-pay

## 2014-10-12 DIAGNOSIS — N184 Chronic kidney disease, stage 4 (severe): Secondary | ICD-10-CM | POA: Diagnosis not present

## 2014-10-12 DIAGNOSIS — D631 Anemia in chronic kidney disease: Secondary | ICD-10-CM | POA: Diagnosis not present

## 2014-10-12 LAB — HEMOGLOBIN AND HEMATOCRIT, BLOOD
HCT: 37.2 % (ref 36.0–46.0)
HEMOGLOBIN: 11.8 g/dL — AB (ref 12.0–15.0)

## 2014-10-12 MED ORDER — EPOETIN ALFA 20000 UNIT/ML IJ SOLN: 20000.0000 [IU] | INTRAMUSCULAR | Status: DC

## 2014-10-12 NOTE — Progress Notes (Signed)
Results for Julie Carpenter, Julie Carpenter (MRN [id number]) as of 10/12/2014 12:24  Labs drawn. No procrit given. Will see pt again 10/26/2014   Ref. Range 10/12/2014 12:00  Hemoglobin Latest Range: 12.0-15.0 g/dL 11.8 (L)  HCT Latest Range: 36.0-46.0 % 37.2

## 2014-10-26 ENCOUNTER — Encounter (HOSPITAL_COMMUNITY)
Admission: RE | Admit: 2014-10-26 | Discharge: 2014-10-26 | Disposition: A | Discharge status: Home / Self Care | Payer: Medicare Other | Source: Ambulatory Visit | Attending: Nephrology | Admitting: Nephrology

## 2014-10-26 DIAGNOSIS — D631 Anemia in chronic kidney disease: Secondary | ICD-10-CM | POA: Diagnosis not present

## 2014-10-26 LAB — HEMOGLOBIN AND HEMATOCRIT, BLOOD
HCT: 34.1 % — ABNORMAL LOW (ref 36.0–46.0)
HEMOGLOBIN: 10.9 g/dL — AB (ref 12.0–15.0)

## 2014-10-26 MED ORDER — EPOETIN ALFA 20000 UNIT/ML IJ SOLN
INTRAMUSCULAR | Status: AC
  Filled 2014-10-26: qty 1

## 2014-10-26 MED ORDER — EPOETIN ALFA 20000 UNIT/ML IJ SOLN
20000.0000 [IU] | INTRAMUSCULAR | Status: DC
  Administered 2014-10-26: 20000 [IU] via SUBCUTANEOUS

## 2014-10-26 NOTE — Progress Notes (Signed)
Hemoglobin 12.0 - 15.0 g/dL 10.9 (L)   HCT 36.0 - 46.0 % 34.1 (L)        Procrit 20,000 units given in R upper arm. Pt to return in 2 weeks.

## 2014-11-09 ENCOUNTER — Encounter (HOSPITAL_COMMUNITY)
Admission: RE | Admit: 2014-11-09 | Discharge: 2014-11-09 | Disposition: A | Discharge status: Home / Self Care | Payer: Medicare Other | Source: Ambulatory Visit | Attending: Nephrology | Admitting: Nephrology

## 2014-11-09 DIAGNOSIS — D638 Anemia in other chronic diseases classified elsewhere: Secondary | ICD-10-CM | POA: Diagnosis present

## 2014-11-09 DIAGNOSIS — N184 Chronic kidney disease, stage 4 (severe): Secondary | ICD-10-CM | POA: Insufficient documentation

## 2014-11-09 LAB — HEMOGLOBIN AND HEMATOCRIT, BLOOD
HCT: 35.6 % — ABNORMAL LOW (ref 36.0–46.0)
Hemoglobin: 11.2 g/dL — ABNORMAL LOW (ref 12.0–15.0)

## 2014-11-09 MED ORDER — EPOETIN ALFA 10000 UNIT/ML IJ SOLN
20000.0000 [IU] | INTRAMUSCULAR | Status: DC
  Administered 2014-11-09: 20000 [IU] via SUBCUTANEOUS
  Filled 2014-11-09 (×2): qty 2

## 2014-11-10 NOTE — Progress Notes (Signed)
Results for RILYN, SCROGGS (MRN [id number]) as of 11/10/2014 11:07  Ref. Range 11/09/2014 11:30  Hemoglobin Latest Range: 12.0-15.0 g/dL 11.2 (L)  HCT Latest Range: 36.0-46.0 % 35.6 (L)  Procrit 20,000 SQ given this Clinic visit

## 2014-11-23 ENCOUNTER — Encounter (HOSPITAL_COMMUNITY)
Admission: RE | Admit: 2014-11-23 | Discharge: 2014-11-23 | Disposition: A | Discharge status: Home / Self Care | Payer: Medicare Other | Source: Ambulatory Visit | Attending: Nephrology | Admitting: Nephrology

## 2014-11-23 DIAGNOSIS — D638 Anemia in other chronic diseases classified elsewhere: Secondary | ICD-10-CM | POA: Diagnosis not present

## 2014-11-23 LAB — HEMOGLOBIN AND HEMATOCRIT, BLOOD
HEMATOCRIT: 37.6 % (ref 36.0–46.0)
Hemoglobin: 12 g/dL (ref 12.0–15.0)

## 2014-11-23 NOTE — Progress Notes (Signed)
Results for Julie Carpenter, Julie Carpenter (MRN [id number]) as of 11/23/2014 11:12  Ref. Range 11/23/2014 11:00  Hemoglobin Latest Range: 12.0-15.0 g/dL 12.0  HCT Latest Range: 36.0-46.0 % 37.6

## 2014-12-07 ENCOUNTER — Encounter (HOSPITAL_COMMUNITY)
Admission: RE | Admit: 2014-12-07 | Discharge: 2014-12-07 | Disposition: A | Discharge status: Home / Self Care | Payer: Medicare Other | Source: Ambulatory Visit | Attending: Nephrology | Admitting: Nephrology

## 2014-12-07 DIAGNOSIS — D638 Anemia in other chronic diseases classified elsewhere: Secondary | ICD-10-CM | POA: Diagnosis not present

## 2014-12-07 LAB — HEMOGLOBIN AND HEMATOCRIT, BLOOD
HCT: 33.8 % — ABNORMAL LOW (ref 36.0–46.0)
Hemoglobin: 11 g/dL — ABNORMAL LOW (ref 12.0–15.0)

## 2014-12-07 MED ORDER — EPOETIN ALFA 20000 UNIT/ML IJ SOLN
20000.0000 [IU] | INTRAMUSCULAR | Status: DC
  Administered 2014-12-07: 20000 [IU] via SUBCUTANEOUS
  Filled 2014-12-07: qty 1

## 2014-12-07 NOTE — Progress Notes (Signed)
Results for DIEGO, DELANCEY (MRN [id number]) as of 12/07/2014 15:59  Ref. Range 12/07/2014 15:18  Hemoglobin Latest Range: 12.0-15.0 g/dL 11.0 (L)  HCT Latest Range: 36.0-46.0 % 33.8 (L)

## 2014-12-21 ENCOUNTER — Encounter (HOSPITAL_COMMUNITY)
Admission: RE | Admit: 2014-12-21 | Discharge: 2014-12-21 | Disposition: A | Discharge status: Home / Self Care | Payer: Medicare Other | Source: Ambulatory Visit | Attending: Nephrology | Admitting: Nephrology

## 2014-12-21 DIAGNOSIS — N184 Chronic kidney disease, stage 4 (severe): Secondary | ICD-10-CM | POA: Diagnosis not present

## 2014-12-21 DIAGNOSIS — D638 Anemia in other chronic diseases classified elsewhere: Secondary | ICD-10-CM | POA: Diagnosis not present

## 2014-12-21 LAB — IRON AND TIBC
IRON: 92 ug/dL (ref 42–145)
Saturation Ratios: 33 % (ref 20–55)
TIBC: 277 ug/dL (ref 250–470)
UIBC: 185 ug/dL (ref 125–400)

## 2014-12-21 LAB — HEMOGLOBIN AND HEMATOCRIT, BLOOD
HEMATOCRIT: 34.9 % — AB (ref 36.0–46.0)
Hemoglobin: 11.2 g/dL — ABNORMAL LOW (ref 12.0–15.0)

## 2014-12-21 LAB — FERRITIN: FERRITIN: 20 ng/mL (ref 10–291)

## 2014-12-21 MED ORDER — EPOETIN ALFA 20000 UNIT/ML IJ SOLN
20000.0000 [IU] | INTRAMUSCULAR | Status: DC
  Administered 2014-12-21: 20000 [IU] via SUBCUTANEOUS

## 2014-12-21 MED ORDER — EPOETIN ALFA 20000 UNIT/ML IJ SOLN
INTRAMUSCULAR | Status: AC
  Filled 2014-12-21: qty 1

## 2014-12-21 NOTE — Progress Notes (Signed)
Here for labs and procrit if indicated. Dx CKD, anemia. 

## 2014-12-26 ENCOUNTER — Ambulatory Visit (INDEPENDENT_AMBULATORY_CARE_PROVIDER_SITE_OTHER): Payer: Medicare Other

## 2014-12-26 ENCOUNTER — Ambulatory Visit (INDEPENDENT_AMBULATORY_CARE_PROVIDER_SITE_OTHER): Payer: Medicare Other | Admitting: Nurse Practitioner

## 2014-12-26 ENCOUNTER — Encounter: Payer: Self-pay | Admitting: Nurse Practitioner

## 2014-12-26 VITALS — BP 187/109 | HR 121 | Temp 97.4°F | Ht 64.0 in | Wt 172.0 lb

## 2014-12-26 DIAGNOSIS — E785 Hyperlipidemia, unspecified: Secondary | ICD-10-CM

## 2014-12-26 DIAGNOSIS — M545 Low back pain, unspecified: Secondary | ICD-10-CM

## 2014-12-26 DIAGNOSIS — N182 Chronic kidney disease, stage 2 (mild): Secondary | ICD-10-CM | POA: Diagnosis not present

## 2014-12-26 DIAGNOSIS — E1142 Type 2 diabetes mellitus with diabetic polyneuropathy: Secondary | ICD-10-CM

## 2014-12-26 DIAGNOSIS — I10 Essential (primary) hypertension: Secondary | ICD-10-CM

## 2014-12-26 DIAGNOSIS — G629 Polyneuropathy, unspecified: Secondary | ICD-10-CM | POA: Diagnosis not present

## 2014-12-26 DIAGNOSIS — M810 Age-related osteoporosis without current pathological fracture: Secondary | ICD-10-CM

## 2014-12-26 DIAGNOSIS — R609 Edema, unspecified: Secondary | ICD-10-CM

## 2014-12-26 DIAGNOSIS — Z23 Encounter for immunization: Secondary | ICD-10-CM | POA: Diagnosis not present

## 2014-12-26 DIAGNOSIS — D500 Iron deficiency anemia secondary to blood loss (chronic): Secondary | ICD-10-CM

## 2014-12-26 DIAGNOSIS — E119 Type 2 diabetes mellitus without complications: Secondary | ICD-10-CM

## 2014-12-26 DIAGNOSIS — K219 Gastro-esophageal reflux disease without esophagitis: Secondary | ICD-10-CM | POA: Diagnosis not present

## 2014-12-26 LAB — POCT GLYCOSYLATED HEMOGLOBIN (HGB A1C): Hemoglobin A1C: 7.2

## 2014-12-26 MED ORDER — CARVEDILOL 12.5 MG PO TABS: 12.5000 mg | ORAL_TABLET | Freq: Every day | ORAL | Status: DC

## 2014-12-26 MED ORDER — ATORVASTATIN CALCIUM 40 MG PO TABS: 40.0000 mg | ORAL_TABLET | Freq: Every day | ORAL | Status: DC

## 2014-12-26 MED ORDER — OMEPRAZOLE 20 MG PO CPDR: DELAYED_RELEASE_CAPSULE | ORAL | Status: DC

## 2014-12-26 MED ORDER — FUROSEMIDE 40 MG PO TABS: 80.0000 mg | ORAL_TABLET | Freq: Every day | ORAL | Status: DC

## 2014-12-26 MED ORDER — AMITRIPTYLINE HCL 50 MG PO TABS: 50.0000 mg | ORAL_TABLET | Freq: Every day | ORAL | Status: DC

## 2014-12-26 MED ORDER — GLIMEPIRIDE 2 MG PO TABS: 2.0000 mg | ORAL_TABLET | Freq: Every day | ORAL | Status: DC

## 2014-12-26 NOTE — Progress Notes (Signed)
Subjective:    Patient ID: Julie Carpenter, female    DOB: 1926/04/07, 81 y.o.   MRN: [id number]  HPI    Review of Systems     Objective:   Physical Exam        Assessment & Plan:    Subjective:    Patient ID: Julie Carpenter, female    DOB: Aug 07, 1926, 81 y.o.   MRN: [id number]  Patient here today for follow up of chronic medical problems.   Diabetes She presents for her follow-up diabetic visit. She has type 2 diabetes mellitus. No MedicAlert identification noted. The initial diagnosis of diabetes was made 25 years ago. Her disease course has been stable. There are no hypoglycemic associated symptoms. Pertinent negatives for diabetes include no chest pain, no foot paresthesias, no foot ulcerations, no polydipsia, no polyphagia, no polyuria, no visual change, no weakness and no weight loss. There are no hypoglycemic complications. Symptoms are stable. There are no diabetic complications. Risk factors for coronary artery disease include dyslipidemia, hypertension and post-menopausal. Current diabetic treatment includes diet and oral agent (monotherapy). She is compliant with treatment all of the time. Her weight is stable. She is following a diabetic diet. When asked about meal planning, she reported none. She has not had a previous visit with a dietician. She rarely participates in exercise. There is no change in her home blood glucose trend. Her breakfast blood glucose is taken between 7-8 am. Her breakfast blood glucose range is generally 110-130 mg/dl. Her dinner blood glucose range is generally 140-180 mg/dl. Her highest blood glucose is 110-130 mg/dl. An ACE inhibitor/angiotensin II receptor blocker is being taken. She does not see a podiatrist.Eye exam is current (september 2013).  Hypertension This is a chronic problem. The current episode started more than 1 year ago. The problem is controlled. Pertinent negatives include no chest pain, neck pain, palpitations, peripheral  edema or shortness of breath. There are no associated agents to hypertension. Risk factors for coronary artery disease include diabetes mellitus, dyslipidemia, post-menopausal state and sedentary lifestyle. Past treatments include angiotensin blockers, calcium channel blockers and diuretics. The current treatment provides significant improvement. There are no compliance problems.   Hyperlipidemia This is a chronic problem. The current episode started more than 1 year ago. The problem is controlled. Recent lipid tests were reviewed and are normal. Exacerbating diseases include diabetes. Pertinent negatives include no chest pain or shortness of breath. Current antihyperlipidemic treatment includes statins. The current treatment provides moderate improvement of lipids. Compliance problems include adherence to diet.  Risk factors for coronary artery disease include diabetes mellitus, hypertension, post-menopausal and a sedentary lifestyle.  Back pain amitriptyline- only takes at night- helps a little Atrial fib Patient takes a baby asa daily- her [place] score was 3- she is doing well- no c/o palpitations   Review of Systems  Constitutional: Negative for weight loss.  Respiratory: Negative for shortness of breath.   Cardiovascular: Negative for chest pain and palpitations.  Endocrine: Negative for polydipsia, polyphagia and polyuria.  Musculoskeletal: Negative for neck pain.  Neurological: Negative for weakness.  All other systems reviewed and are negative.      Objective:   Physical Exam  Constitutional: She is oriented to person, place, and time. She appears well-developed and well-nourished.  HENT:  Nose: Nose normal.  Mouth/Throat: Oropharynx is clear and moist.  Eyes: EOM are normal.  Neck: Trachea normal, normal range of motion and full passive range of motion without pain. Neck supple. No JVD present.  Carotid bruit is not present. No thyromegaly present.  Cardiovascular: Normal rate,  normal heart sounds and intact distal pulses.  Exam reveals no gallop and no friction rub.   No murmur heard. Irregular rhythym  Pulmonary/Chest: Effort normal and breath sounds normal.  Abdominal: Soft. Bowel sounds are normal. She exhibits no distension and no mass. There is no tenderness.  Musculoskeletal: Normal range of motion.  Lymphadenopathy:    She has no cervical adenopathy.  Neurological: She is alert and oriented to person, place, and time. She has normal reflexes.  Skin: Skin is warm and dry.  Psychiatric: She has a normal mood and affect. Her behavior is normal. Judgment and thought content normal.   BP 187/109 mmHg  Pulse 121  Temp(Src) 97.4 F (36.3 C) (Oral)  Ht 5' 4"  (1.626 m)  Wt 172 lb (78.019 kg)  BMI 29.51 kg/m2  Results for orders placed or performed in visit on 12/26/14  POCT glycosylated hemoglobin (Hb A1C)  Result Value Ref Range   Hemoglobin A1C 7.2             Assessment & Plan:   1. Essential hypertension Do not add salt to diet - CMP14+EGFR - carvedilol (COREG) 12.5 MG tablet; Take 1 tablet (12.5 mg total) by mouth daily. One daily  Dispense: 90 tablet; Refill: 1  2. Hyperlipemia Low fat diet - NMR, lipoprofile - atorvastatin (LIPITOR) 40 MG tablet; Take 1 tablet (40 mg total) by mouth daily.  Dispense: 90 tablet; Refill: 1  3. Type 2 diabetes mellitus with polyneuropathy Continue to watch carbs - POCT glycosylated hemoglobin (Hb A1C) - glimepiride (AMARYL) 2 MG tablet; Take 1 tablet (2 mg total) by mouth daily before breakfast.  Dispense: 90 tablet; Refill: 1  4. Peripheral neuropathy Wear shoe- do not go barefooted  5. Osteoporosis Weight bearing exercises  6. CKD (chronic kidney disease), stage 2 (mild)  7. Peripheral edema Elevate legs when sitting - furosemide (LASIX) 40 MG tablet; Take 2 tablets (80 mg total) by mouth daily.  Dispense: 180 tablet; Refill: 1  8. Iron deficiency anemia due to chronic blood loss - Anemia  Profile B  9. Bilateral low back pain without sciatica - amitriptyline (ELAVIL) 50 MG tablet; Take 1 tablet (50 mg total) by mouth at bedtime.  Dispense: 90 tablet; Refill: 1  10. Gastroesophageal reflux disease without esophagitis Avoid spicy foods Do not eat 2 hours prior to bedtime - omeprazole (PRILOSEC) 20 MG capsule; 1 PO EVERY MORNING  Dispense: 90 capsule; Refill: 1      Labs pending Health maintenance reviewed Diet and exercise encouraged Continue all meds Follow up  In 3 months   [place], FNP

## 2014-12-26 NOTE — Addendum Note (Signed)
Addended by: Rolena Infante on: 12/26/2014 09:42 AM   Modules accepted: Orders

## 2014-12-26 NOTE — Patient Instructions (Signed)

## 2014-12-26 NOTE — Addendum Note (Signed)
Addended by: Rolena Infante on: 12/26/2014 09:32 AM   Modules accepted: Orders

## 2014-12-27 LAB — ANEMIA PROFILE B
Basophils Absolute: 0.1 10*3/uL (ref 0.0–0.2)
Basos: 1 %
Eos: 5 %
Eosinophils Absolute: 0.3 10*3/uL (ref 0.0–0.4)
FERRITIN: 20 ng/mL (ref 15–150)
HEMATOCRIT: 35 % (ref 34.0–46.6)
Hemoglobin: 11.3 g/dL (ref 11.1–15.9)
IMMATURE GRANS (ABS): 0 10*3/uL (ref 0.0–0.1)
IMMATURE GRANULOCYTES: 0 %
Iron Saturation: 13 % — ABNORMAL LOW (ref 15–55)
Iron: 35 ug/dL (ref 27–139)
Lymphocytes Absolute: 1.1 10*3/uL (ref 0.7–3.1)
Lymphs: 18 %
MCH: 28.8 pg (ref 26.6–33.0)
MCHC: 32.3 g/dL (ref 31.5–35.7)
MCV: 89 fL (ref 79–97)
MONOCYTES: 10 %
Monocytes Absolute: 0.6 10*3/uL (ref 0.1–0.9)
Neutrophils Absolute: 4 10*3/uL (ref 1.4–7.0)
Neutrophils Relative %: 66 %
PLATELETS: 291 10*3/uL (ref 150–379)
RBC: 3.93 x10E6/uL (ref 3.77–5.28)
RDW: 15.5 % — AB (ref 12.3–15.4)
Retic Ct Pct: 2.8 % — ABNORMAL HIGH (ref 0.6–2.6)
TIBC: 263 ug/dL (ref 250–450)
UIBC: 228 ug/dL (ref 118–369)
Vitamin B-12: 429 pg/mL (ref 211–946)
WBC: 6.1 10*3/uL (ref 3.4–10.8)

## 2014-12-27 LAB — CMP14+EGFR
A/G RATIO: 1.5 (ref 1.1–2.5)
ALBUMIN: 3.9 g/dL (ref 3.5–4.7)
ALK PHOS: 124 IU/L — AB (ref 39–117)
ALT: 33 IU/L — ABNORMAL HIGH (ref 0–32)
AST: 39 IU/L (ref 0–40)
BILIRUBIN TOTAL: 0.8 mg/dL (ref 0.0–1.2)
BUN / CREAT RATIO: 14 (ref 11–26)
BUN: 20 mg/dL (ref 8–27)
CO2: 19 mmol/L (ref 18–29)
CREATININE: 1.45 mg/dL — AB (ref 0.57–1.00)
Calcium: 9.6 mg/dL (ref 8.7–10.3)
Chloride: 108 mmol/L (ref 97–108)
GFR calc non Af Amer: 33 mL/min/{1.73_m2} — ABNORMAL LOW (ref >59)
GFR, EST AFRICAN AMERICAN: 38 mL/min/{1.73_m2} — AB (ref >59)
GLOBULIN, TOTAL: 2.6 g/dL (ref 1.5–4.5)
Glucose: 119 mg/dL — ABNORMAL HIGH (ref 65–99)
Potassium: 5.2 mmol/L (ref 3.5–5.2)
SODIUM: 141 mmol/L (ref 134–144)
Total Protein: 6.5 g/dL (ref 6.0–8.5)

## 2014-12-27 LAB — NMR, LIPOPROFILE
Cholesterol: 198 mg/dL (ref 100–199)
HDL Cholesterol by NMR: 34 mg/dL — ABNORMAL LOW (ref >39)
HDL PARTICLE NUMBER: 23.7 umol/L — AB (ref >=30.5)
LDL Particle Number: 1511 nmol/L — ABNORMAL HIGH (ref <1000)
LDL Size: 20.4 nm (ref >20.5)
LDL-C: 121 mg/dL — ABNORMAL HIGH (ref 0–99)
LP-IR SCORE: 62 — AB (ref <=45)
Small LDL Particle Number: 862 nmol/L — ABNORMAL HIGH (ref <=527)
Triglycerides by NMR: 215 mg/dL — ABNORMAL HIGH (ref 0–149)

## 2015-01-04 ENCOUNTER — Encounter (HOSPITAL_COMMUNITY)
Admission: RE | Admit: 2015-01-04 | Discharge: 2015-01-04 | Disposition: A | Discharge status: Home / Self Care | Payer: Medicare Other | Source: Ambulatory Visit | Attending: Nephrology | Admitting: Nephrology

## 2015-01-04 ENCOUNTER — Encounter (HOSPITAL_COMMUNITY): Payer: Self-pay

## 2015-01-04 DIAGNOSIS — N184 Chronic kidney disease, stage 4 (severe): Secondary | ICD-10-CM | POA: Diagnosis not present

## 2015-01-04 LAB — HEMOGLOBIN AND HEMATOCRIT, BLOOD
HCT: 34.9 % — ABNORMAL LOW (ref 36.0–46.0)
HEMOGLOBIN: 11 g/dL — AB (ref 12.0–15.0)

## 2015-01-04 MED ORDER — EPOETIN ALFA 10000 UNIT/ML IJ SOLN
20000.0000 [IU] | INTRAMUSCULAR | Status: DC
  Administered 2015-01-04: 20000 [IU] via SUBCUTANEOUS
  Filled 2015-01-04: qty 2

## 2015-01-04 NOTE — Progress Notes (Signed)
Results for Julie Carpenter, Julie Carpenter (MRN [id number]) as of 01/04/2015 11:32  Ref. Range 01/04/2015 11:00  Hemoglobin Latest Ref Range: 12.0-15.0 g/dL 11.0 (L)  HCT Latest Ref Range: 36.0-46.0 % 34.9 (L)

## 2015-01-18 ENCOUNTER — Encounter (HOSPITAL_COMMUNITY)
Admission: RE | Admit: 2015-01-18 | Discharge: 2015-01-18 | Disposition: A | Discharge status: Home / Self Care | Payer: Medicare Other | Source: Ambulatory Visit | Attending: Nephrology | Admitting: Nephrology

## 2015-01-18 ENCOUNTER — Encounter (HOSPITAL_COMMUNITY): Payer: Self-pay

## 2015-01-18 DIAGNOSIS — D631 Anemia in chronic kidney disease: Secondary | ICD-10-CM | POA: Insufficient documentation

## 2015-01-18 DIAGNOSIS — N184 Chronic kidney disease, stage 4 (severe): Secondary | ICD-10-CM | POA: Diagnosis present

## 2015-01-18 LAB — HEMOGLOBIN AND HEMATOCRIT, BLOOD
HEMATOCRIT: 38.7 % (ref 36.0–46.0)
Hemoglobin: 12.2 g/dL (ref 12.0–15.0)

## 2015-01-18 NOTE — Progress Notes (Signed)
Results for NAVEA, WOODROW (MRN [id number]) as of 01/18/2015 12:02  No procrit given per order, next appointment 02/01/2015   Ref. Range 01/18/2015 11:25  Hemoglobin Latest Ref Range: 12.0-15.0 g/dL 12.2  HCT Latest Ref Range: 36.0-46.0 % 38.7

## 2015-02-01 ENCOUNTER — Encounter (HOSPITAL_COMMUNITY)
Admission: RE | Admit: 2015-02-01 | Discharge: 2015-02-01 | Disposition: A | Discharge status: Home / Self Care | Payer: Medicare Other | Source: Ambulatory Visit | Attending: Nephrology | Admitting: Nephrology

## 2015-02-01 DIAGNOSIS — N184 Chronic kidney disease, stage 4 (severe): Secondary | ICD-10-CM | POA: Diagnosis not present

## 2015-02-01 LAB — HEMOGLOBIN AND HEMATOCRIT, BLOOD
HCT: 36.5 % (ref 36.0–46.0)
Hemoglobin: 11.6 g/dL — ABNORMAL LOW (ref 12.0–15.0)

## 2015-02-01 NOTE — Progress Notes (Signed)
Results for ADY, HEIMANN (MRN [id number]) as of 02/01/2015 11:21  Ref. Range 02/01/2015 11:00  Hemoglobin Latest Ref Range: 12.0-15.0 g/dL 11.6 (L)  HCT Latest Ref Range: 36.0-46.0 % 36.5   No Procrit needed d/t Hgb >11.5.

## 2015-02-15 ENCOUNTER — Encounter (HOSPITAL_COMMUNITY)
Admission: RE | Admit: 2015-02-15 | Discharge: 2015-02-15 | Disposition: A | Discharge status: Home / Self Care | Payer: Medicare Other | Source: Ambulatory Visit | Attending: Nephrology | Admitting: Nephrology

## 2015-02-15 DIAGNOSIS — D638 Anemia in other chronic diseases classified elsewhere: Secondary | ICD-10-CM | POA: Diagnosis not present

## 2015-02-15 DIAGNOSIS — N184 Chronic kidney disease, stage 4 (severe): Secondary | ICD-10-CM | POA: Insufficient documentation

## 2015-02-15 LAB — HEMOGLOBIN AND HEMATOCRIT, BLOOD
HCT: 35.6 % — ABNORMAL LOW (ref 36.0–46.0)
HEMOGLOBIN: 11.3 g/dL — AB (ref 12.0–15.0)

## 2015-02-15 MED ORDER — EPOETIN ALFA 20000 UNIT/ML IJ SOLN
INTRAMUSCULAR | Status: AC
  Filled 2015-02-15: qty 1

## 2015-02-15 MED ORDER — EPOETIN ALFA 10000 UNIT/ML IJ SOLN
20000.0000 [IU] | INTRAMUSCULAR | Status: DC
  Administered 2015-02-15: 20000 [IU] via SUBCUTANEOUS

## 2015-02-15 NOTE — Progress Notes (Signed)
hh 11.3 therefore procrit given.

## 2015-03-01 ENCOUNTER — Encounter (HOSPITAL_COMMUNITY)
Admission: RE | Admit: 2015-03-01 | Discharge: 2015-03-01 | Disposition: A | Discharge status: Home / Self Care | Payer: Medicare Other | Source: Ambulatory Visit | Attending: Nephrology | Admitting: Nephrology

## 2015-03-01 DIAGNOSIS — D638 Anemia in other chronic diseases classified elsewhere: Secondary | ICD-10-CM | POA: Diagnosis not present

## 2015-03-01 LAB — HEMOGLOBIN AND HEMATOCRIT, BLOOD
HCT: 34.3 % — ABNORMAL LOW (ref 36.0–46.0)
Hemoglobin: 10.9 g/dL — ABNORMAL LOW (ref 12.0–15.0)

## 2015-03-01 MED ORDER — EPOETIN ALFA 10000 UNIT/ML IJ SOLN: 20000.0000 [IU] | Freq: Once | INTRAMUSCULAR | Status: DC

## 2015-03-01 MED ORDER — EPOETIN ALFA 20000 UNIT/ML IJ SOLN
INTRAMUSCULAR | Status: AC
  Filled 2015-03-01: qty 1

## 2015-03-01 MED ORDER — EPOETIN ALFA 20000 UNIT/ML IJ SOLN
20000.0000 [IU] | Freq: Once | INTRAMUSCULAR | Status: AC
  Administered 2015-03-01: 20000 [IU] via SUBCUTANEOUS

## 2015-03-01 NOTE — Progress Notes (Signed)
Results for GERYL, DOHN (MRN [id number]) as of 03/01/2015 15:21  Ref. Range 03/01/2015 11:06  Hemoglobin Latest Ref Range: 12.0-15.0 g/dL 10.9 (L)  HCT Latest Ref Range: 36.0-46.0 % 34.3 (L)

## 2015-03-15 ENCOUNTER — Encounter (HOSPITAL_COMMUNITY)
Admission: RE | Admit: 2015-03-15 | Discharge: 2015-03-15 | Disposition: A | Discharge status: Home / Self Care | Payer: Medicare Other | Source: Ambulatory Visit | Attending: Nephrology | Admitting: Nephrology

## 2015-03-15 ENCOUNTER — Encounter (HOSPITAL_COMMUNITY): Payer: Self-pay

## 2015-03-15 DIAGNOSIS — N184 Chronic kidney disease, stage 4 (severe): Secondary | ICD-10-CM | POA: Diagnosis present

## 2015-03-15 DIAGNOSIS — Z5181 Encounter for therapeutic drug level monitoring: Secondary | ICD-10-CM | POA: Diagnosis not present

## 2015-03-15 DIAGNOSIS — Z79899 Other long term (current) drug therapy: Secondary | ICD-10-CM | POA: Insufficient documentation

## 2015-03-15 DIAGNOSIS — D631 Anemia in chronic kidney disease: Secondary | ICD-10-CM | POA: Diagnosis not present

## 2015-03-15 LAB — HEMOGLOBIN AND HEMATOCRIT, BLOOD
HCT: 38.8 % (ref 36.0–46.0)
Hemoglobin: 12.2 g/dL (ref 12.0–15.0)

## 2015-03-15 NOTE — Progress Notes (Signed)
Results for OREAN, GIARRATANO (MRN [id number]) as of 03/15/2015 13:43  Ref. Range 03/15/2015 11:10  Hemoglobin Latest Ref Range: 12.0-15.0 g/dL 12.2  HCT Latest Ref Range: 36.0-46.0 % 38.8

## 2015-03-29 ENCOUNTER — Encounter (HOSPITAL_COMMUNITY)
Admission: RE | Admit: 2015-03-29 | Discharge: 2015-03-29 | Disposition: A | Discharge status: Home / Self Care | Payer: Medicare Other | Source: Ambulatory Visit | Attending: Nephrology | Admitting: Nephrology

## 2015-03-29 DIAGNOSIS — N184 Chronic kidney disease, stage 4 (severe): Secondary | ICD-10-CM | POA: Diagnosis not present

## 2015-03-29 LAB — IRON AND TIBC
Iron: 63 ug/dL (ref 28–170)
Saturation Ratios: 23 % (ref 10.4–31.8)
TIBC: 273 ug/dL (ref 250–450)
UIBC: 210 ug/dL

## 2015-03-29 LAB — HEMOGLOBIN AND HEMATOCRIT, BLOOD
HEMATOCRIT: 33.7 % — AB (ref 36.0–46.0)
Hemoglobin: 11 g/dL — ABNORMAL LOW (ref 12.0–15.0)

## 2015-03-29 LAB — FERRITIN: Ferritin: 27 ng/mL (ref 11–307)

## 2015-03-29 MED ORDER — EPOETIN ALFA 20000 UNIT/ML IJ SOLN
INTRAMUSCULAR | Status: AC
  Filled 2015-03-29: qty 1

## 2015-03-29 MED ORDER — EPOETIN ALFA 20000 UNIT/ML IJ SOLN
20000.0000 [IU] | INTRAMUSCULAR | Status: DC
  Administered 2015-03-29: 20000 [IU] via SUBCUTANEOUS

## 2015-04-05 ENCOUNTER — Other Ambulatory Visit: Payer: Self-pay | Admitting: Nurse Practitioner

## 2015-04-12 ENCOUNTER — Encounter (HOSPITAL_COMMUNITY)
Admission: RE | Admit: 2015-04-12 | Discharge: 2015-04-12 | Disposition: A | Discharge status: Home / Self Care | Payer: Medicare Other | Source: Ambulatory Visit | Attending: Nephrology | Admitting: Nephrology

## 2015-04-12 DIAGNOSIS — N184 Chronic kidney disease, stage 4 (severe): Secondary | ICD-10-CM | POA: Insufficient documentation

## 2015-04-12 DIAGNOSIS — D638 Anemia in other chronic diseases classified elsewhere: Secondary | ICD-10-CM | POA: Diagnosis present

## 2015-04-12 LAB — HEMOGLOBIN AND HEMATOCRIT, BLOOD
HCT: 35 % — ABNORMAL LOW (ref 36.0–46.0)
HEMOGLOBIN: 11 g/dL — AB (ref 12.0–15.0)

## 2015-04-12 MED ORDER — EPOETIN ALFA 20000 UNIT/ML IJ SOLN
20000.0000 [IU] | INTRAMUSCULAR | Status: DC
  Administered 2015-04-12: 20000 [IU] via SUBCUTANEOUS

## 2015-04-12 MED ORDER — EPOETIN ALFA 20000 UNIT/ML IJ SOLN
INTRAMUSCULAR | Status: AC
  Filled 2015-04-12: qty 1

## 2015-04-12 NOTE — Progress Notes (Signed)
Results for LIBERTIE, HAUSLER (MRN [id number]) as of 04/12/2015 11:51  Ref. Range 04/12/2015 10:50  Hemoglobin Latest Ref Range: 12.0-15.0 g/dL 11.0 (L)  HCT Latest Ref Range: 36.0-46.0 % 35.0 (L)

## 2015-04-17 ENCOUNTER — Other Ambulatory Visit: Payer: Self-pay | Admitting: Nurse Practitioner

## 2015-04-26 ENCOUNTER — Encounter (HOSPITAL_COMMUNITY)
Admission: RE | Admit: 2015-04-26 | Discharge: 2015-04-26 | Disposition: A | Discharge status: Home / Self Care | Payer: Medicare Other | Source: Ambulatory Visit | Attending: Nephrology | Admitting: Nephrology

## 2015-04-26 DIAGNOSIS — N184 Chronic kidney disease, stage 4 (severe): Secondary | ICD-10-CM | POA: Diagnosis not present

## 2015-04-26 LAB — HEMOGLOBIN AND HEMATOCRIT, BLOOD
HCT: 32.2 % — ABNORMAL LOW (ref 36.0–46.0)
Hemoglobin: 10.2 g/dL — ABNORMAL LOW (ref 12.0–15.0)

## 2015-04-26 MED ORDER — EPOETIN ALFA 20000 UNIT/ML IJ SOLN
20000.0000 [IU] | INTRAMUSCULAR | Status: DC
  Administered 2015-04-26: 20000 [IU] via SUBCUTANEOUS
  Filled 2015-04-26: qty 1

## 2015-04-26 NOTE — Progress Notes (Signed)
Results for ABRAR, KOONE (MRN [id number]) as of 04/26/2015 11:38  Ref. Range 04/26/2015 10:50  Hemoglobin Latest Ref Range: 12.0-15.0 g/dL 10.2 (L)  HCT Latest Ref Range: 36.0-46.0 % 32.2 (L)  Procrit 20000 units SQ given in L upper arm. Pt tolerated well.

## 2015-05-10 ENCOUNTER — Encounter (HOSPITAL_COMMUNITY)
Admission: RE | Admit: 2015-05-10 | Discharge: 2015-05-10 | Disposition: A | Discharge status: Home / Self Care | Payer: Medicare Other | Source: Ambulatory Visit | Attending: Nephrology | Admitting: Nephrology

## 2015-05-10 ENCOUNTER — Encounter (HOSPITAL_COMMUNITY): Payer: Self-pay

## 2015-05-10 DIAGNOSIS — N184 Chronic kidney disease, stage 4 (severe): Secondary | ICD-10-CM | POA: Insufficient documentation

## 2015-05-10 DIAGNOSIS — D638 Anemia in other chronic diseases classified elsewhere: Secondary | ICD-10-CM | POA: Insufficient documentation

## 2015-05-10 LAB — HEMOGLOBIN AND HEMATOCRIT, BLOOD
HCT: 36.8 % (ref 36.0–46.0)
Hemoglobin: 11.8 g/dL — ABNORMAL LOW (ref 12.0–15.0)

## 2015-05-10 NOTE — Progress Notes (Signed)
Results for GARI, TROVATO (MRN [id number]) as of 05/10/2015 11:11  Ref. Range 05/10/2015 11:00  Hemoglobin Latest Ref Range: 12.0-15.0 g/dL 11.8 (L)  HCT Latest Ref Range: 36.0-46.0 % 36.8

## 2015-05-24 ENCOUNTER — Encounter (HOSPITAL_COMMUNITY)
Admission: RE | Admit: 2015-05-24 | Discharge: 2015-05-24 | Disposition: A | Discharge status: Home / Self Care | Payer: Medicare Other | Source: Ambulatory Visit | Attending: Nephrology | Admitting: Nephrology

## 2015-05-24 DIAGNOSIS — Z5181 Encounter for therapeutic drug level monitoring: Secondary | ICD-10-CM | POA: Insufficient documentation

## 2015-05-24 DIAGNOSIS — Z79899 Other long term (current) drug therapy: Secondary | ICD-10-CM | POA: Insufficient documentation

## 2015-05-24 DIAGNOSIS — N184 Chronic kidney disease, stage 4 (severe): Secondary | ICD-10-CM | POA: Insufficient documentation

## 2015-05-24 DIAGNOSIS — D631 Anemia in chronic kidney disease: Secondary | ICD-10-CM | POA: Insufficient documentation

## 2015-05-24 LAB — HEMOGLOBIN AND HEMATOCRIT, BLOOD
HCT: 34.2 % — ABNORMAL LOW (ref 36.0–46.0)
Hemoglobin: 11.2 g/dL — ABNORMAL LOW (ref 12.0–15.0)

## 2015-05-24 MED ORDER — EPOETIN ALFA 20000 UNIT/ML IJ SOLN
INTRAMUSCULAR | Status: AC
  Filled 2015-05-24: qty 1

## 2015-05-24 MED ORDER — EPOETIN ALFA 20000 UNIT/ML IJ SOLN
20000.0000 [IU] | Freq: Once | INTRAMUSCULAR | Status: AC
  Administered 2015-05-24: 20000 [IU] via SUBCUTANEOUS

## 2015-05-24 NOTE — Progress Notes (Signed)
Results for Julie Carpenter, Julie Carpenter (MRN [id number]) as of 05/24/2015 11:35  Ref. Range 05/24/2015 10:50  Hemoglobin Latest Ref Range: 12.0-15.0 g/dL 11.2 (L)  HCT Latest Ref Range: 36.0-46.0 % 34.2 (L)

## 2015-05-29 ENCOUNTER — Telehealth: Payer: Self-pay | Admitting: Nurse Practitioner

## 2015-06-07 ENCOUNTER — Encounter (HOSPITAL_COMMUNITY)
Admission: RE | Admit: 2015-06-07 | Discharge: 2015-06-07 | Disposition: A | Discharge status: Home / Self Care | Payer: Medicare Other | Source: Ambulatory Visit | Attending: Nephrology | Admitting: Nephrology

## 2015-06-07 DIAGNOSIS — N184 Chronic kidney disease, stage 4 (severe): Secondary | ICD-10-CM | POA: Diagnosis not present

## 2015-06-07 LAB — HEMOGLOBIN AND HEMATOCRIT, BLOOD
HEMATOCRIT: 36.9 % (ref 36.0–46.0)
HEMOGLOBIN: 11.5 g/dL — AB (ref 12.0–15.0)

## 2015-06-07 NOTE — Progress Notes (Signed)
hgb 11.5 therefore med held. Results faxed to Dr Mercy Moore.

## 2015-06-21 ENCOUNTER — Encounter (HOSPITAL_COMMUNITY)
Admission: RE | Admit: 2015-06-21 | Discharge: 2015-06-21 | Disposition: A | Discharge status: Home / Self Care | Payer: Medicare Other | Source: Ambulatory Visit | Attending: Nephrology | Admitting: Nephrology

## 2015-06-21 ENCOUNTER — Encounter (HOSPITAL_COMMUNITY): Payer: Self-pay

## 2015-06-21 DIAGNOSIS — D638 Anemia in other chronic diseases classified elsewhere: Secondary | ICD-10-CM | POA: Insufficient documentation

## 2015-06-21 DIAGNOSIS — N184 Chronic kidney disease, stage 4 (severe): Secondary | ICD-10-CM | POA: Insufficient documentation

## 2015-06-21 LAB — IRON AND TIBC
Iron: 57 ug/dL (ref 28–170)
Saturation Ratios: 21 % (ref 10.4–31.8)
TIBC: 273 ug/dL (ref 250–450)
UIBC: 216 ug/dL

## 2015-06-21 LAB — HEMOGLOBIN AND HEMATOCRIT, BLOOD
HEMATOCRIT: 33.9 % — AB (ref 36.0–46.0)
HEMOGLOBIN: 10.8 g/dL — AB (ref 12.0–15.0)

## 2015-06-21 LAB — FERRITIN: FERRITIN: 35 ng/mL (ref 11–307)

## 2015-06-21 MED ORDER — EPOETIN ALFA 20000 UNIT/ML IJ SOLN
20000.0000 [IU] | Freq: Once | INTRAMUSCULAR | Status: AC
  Administered 2015-06-21: 20000 [IU] via SUBCUTANEOUS

## 2015-06-21 MED ORDER — EPOETIN ALFA 20000 UNIT/ML IJ SOLN: 20000.0000 [IU] | Freq: Once | INTRAMUSCULAR | Status: DC

## 2015-06-21 MED ORDER — EPOETIN ALFA 10000 UNIT/ML IJ SOLN
20000.0000 [IU] | INTRAMUSCULAR | Status: DC
  Filled 2015-06-21 (×2): qty 2

## 2015-07-05 ENCOUNTER — Encounter (HOSPITAL_COMMUNITY)
Admission: RE | Admit: 2015-07-05 | Discharge: 2015-07-05 | Disposition: A | Discharge status: Home / Self Care | Payer: Medicare Other | Source: Ambulatory Visit | Attending: Nephrology | Admitting: Nephrology

## 2015-07-05 DIAGNOSIS — N184 Chronic kidney disease, stage 4 (severe): Secondary | ICD-10-CM | POA: Diagnosis not present

## 2015-07-05 LAB — HEMOGLOBIN AND HEMATOCRIT, BLOOD
HEMATOCRIT: 36.9 % (ref 36.0–46.0)
Hemoglobin: 11.8 g/dL — ABNORMAL LOW (ref 12.0–15.0)

## 2015-07-05 MED ORDER — EPOETIN ALFA 20000 UNIT/ML IJ SOLN: 20000.0000 [IU] | Freq: Once | INTRAMUSCULAR | Status: DC

## 2015-07-05 NOTE — Progress Notes (Signed)
Results for ITATI, BROCKSMITH (MRN [id number]) as of 07/05/2015 12:53  Ref. Range 07/05/2015 12:23  Hemoglobin Latest Ref Range: 12.0-15.0 g/dL 11.8 (L)  HCT Latest Ref Range: 36.0-46.0 % 36.9

## 2015-07-11 ENCOUNTER — Other Ambulatory Visit: Payer: Self-pay | Admitting: Nurse Practitioner

## 2015-07-19 ENCOUNTER — Encounter (HOSPITAL_COMMUNITY): Payer: Self-pay | Admitting: Emergency Medicine

## 2015-07-19 ENCOUNTER — Other Ambulatory Visit: Payer: Self-pay

## 2015-07-19 ENCOUNTER — Emergency Department (HOSPITAL_COMMUNITY)
Admission: EM | Admit: 2015-07-19 | Discharge: 2015-07-19 | Disposition: A | Discharge status: Home / Self Care | Payer: Medicare Other | Attending: Emergency Medicine | Admitting: Emergency Medicine

## 2015-07-19 ENCOUNTER — Encounter (HOSPITAL_COMMUNITY)
Admission: RE | Admit: 2015-07-19 | Discharge: 2015-07-19 | Disposition: A | Discharge status: Home / Self Care | Payer: Medicare Other | Source: Ambulatory Visit | Attending: Nephrology | Admitting: Nephrology

## 2015-07-19 DIAGNOSIS — Z5181 Encounter for therapeutic drug level monitoring: Secondary | ICD-10-CM | POA: Insufficient documentation

## 2015-07-19 DIAGNOSIS — I4891 Unspecified atrial fibrillation: Secondary | ICD-10-CM | POA: Insufficient documentation

## 2015-07-19 DIAGNOSIS — E119 Type 2 diabetes mellitus without complications: Secondary | ICD-10-CM | POA: Insufficient documentation

## 2015-07-19 DIAGNOSIS — Y9289 Other specified places as the place of occurrence of the external cause: Secondary | ICD-10-CM | POA: Insufficient documentation

## 2015-07-19 DIAGNOSIS — Z79899 Other long term (current) drug therapy: Secondary | ICD-10-CM | POA: Insufficient documentation

## 2015-07-19 DIAGNOSIS — E785 Hyperlipidemia, unspecified: Secondary | ICD-10-CM | POA: Diagnosis not present

## 2015-07-19 DIAGNOSIS — D631 Anemia in chronic kidney disease: Secondary | ICD-10-CM | POA: Insufficient documentation

## 2015-07-19 DIAGNOSIS — I951 Orthostatic hypotension: Secondary | ICD-10-CM | POA: Insufficient documentation

## 2015-07-19 DIAGNOSIS — Z880 Allergy status to penicillin: Secondary | ICD-10-CM | POA: Diagnosis not present

## 2015-07-19 DIAGNOSIS — I10 Essential (primary) hypertension: Secondary | ICD-10-CM | POA: Insufficient documentation

## 2015-07-19 DIAGNOSIS — Z043 Encounter for examination and observation following other accident: Secondary | ICD-10-CM | POA: Diagnosis present

## 2015-07-19 DIAGNOSIS — Y9389 Activity, other specified: Secondary | ICD-10-CM | POA: Diagnosis not present

## 2015-07-19 DIAGNOSIS — D649 Anemia, unspecified: Secondary | ICD-10-CM | POA: Insufficient documentation

## 2015-07-19 DIAGNOSIS — Y998 Other external cause status: Secondary | ICD-10-CM | POA: Insufficient documentation

## 2015-07-19 DIAGNOSIS — N184 Chronic kidney disease, stage 4 (severe): Secondary | ICD-10-CM | POA: Diagnosis present

## 2015-07-19 LAB — CBC WITH DIFFERENTIAL/PLATELET
BASOS ABS: 0 10*3/uL (ref 0.0–0.1)
Basophils Relative: 1 %
EOS PCT: 3 %
Eosinophils Absolute: 0.3 10*3/uL (ref 0.0–0.7)
HEMATOCRIT: 38.9 % (ref 36.0–46.0)
Hemoglobin: 12.5 g/dL (ref 12.0–15.0)
LYMPHS ABS: 1 10*3/uL (ref 0.7–4.0)
LYMPHS PCT: 13 %
MCH: 27.9 pg (ref 26.0–34.0)
MCHC: 32.1 g/dL (ref 30.0–36.0)
MCV: 86.8 fL (ref 78.0–100.0)
MONO ABS: 0.7 10*3/uL (ref 0.1–1.0)
MONOS PCT: 8 %
NEUTROS ABS: 6 10*3/uL (ref 1.7–7.7)
Neutrophils Relative %: 75 %
Platelets: 272 10*3/uL (ref 150–400)
RBC: 4.48 MIL/uL (ref 3.87–5.11)
RDW: 14.4 % (ref 11.5–15.5)
WBC: 8 10*3/uL (ref 4.0–10.5)

## 2015-07-19 LAB — BASIC METABOLIC PANEL
ANION GAP: 10 (ref 5–15)
BUN: 26 mg/dL — AB (ref 6–20)
CO2: 24 mmol/L (ref 22–32)
Calcium: 9.3 mg/dL (ref 8.9–10.3)
Chloride: 99 mmol/L — ABNORMAL LOW (ref 101–111)
Creatinine, Ser: 1.72 mg/dL — ABNORMAL HIGH (ref 0.44–1.00)
GFR calc Af Amer: 30 mL/min — ABNORMAL LOW (ref >60)
GFR calc non Af Amer: 26 mL/min — ABNORMAL LOW (ref >60)
GLUCOSE: 373 mg/dL — AB (ref 65–99)
POTASSIUM: 4 mmol/L (ref 3.5–5.1)
Sodium: 133 mmol/L — ABNORMAL LOW (ref 135–145)

## 2015-07-19 LAB — GLUCOSE, CAPILLARY: Glucose-Capillary: 243 mg/dL — ABNORMAL HIGH (ref 65–99)

## 2015-07-19 MED ORDER — FUROSEMIDE 20 MG PO TABS: 20.0000 mg | ORAL_TABLET | Freq: Every day | ORAL | Status: DC

## 2015-07-19 NOTE — ED Provider Notes (Signed)
CSN: [id number]     Arrival date & time 07/19/15  1120 History  By signing my name below, I, Julie Carpenter, attest that this documentation has been prepared under the direction and in the presence of Nat Christen, MD.  Electronically Signed: Tula Carpenter, ED Scribe. 07/19/2015. 11:58 AM.  Chief Complaint  Patient presents with  . Hypotension  . Fall   The history is provided by the patient. No language interpreter was used.    HPI Comments: Julie Carpenter is a 81 y.o. female with a history of HTN, accompanied by her daughter, who presents to the Emergency Department after a fall that occurred PTA. Her daughter reports decreased BP, measuring 76/46, after the fall as an associated symptom. She notes recurrent falls over the last few weeks. Pt's fall today occurred after she stood up from the car, felt imbalanced and fell against the car. She is currently being treated by short stay with procrit injections for anemia. Pt currently takes Lasix 40 mg and carvedilol 12.5 mg for HTN. She denies hitting her head or LOC. Pt also denies any current pain.   Past Medical History  Diagnosis Date  . Hyperlipidemia   . Hypertension   . Diabetes mellitus without complication ([place])   . Atrial fibrillation [facility])    Past Surgical History  Procedure Laterality Date  . Abdominal hysterectomy    . Cholecystectomy    . Bladder tack    . Esophagogastroduodenoscopy N/A 04/06/2013    Procedure: ESOPHAGOGASTRODUODENOSCOPY (EGD);  Surgeon: Danie Binder, MD;  Location: [facility] ENDO SUITE;  Service: Endoscopy;  Laterality: N/A;  8:45  . Givens capsule study  04/06/2013    Procedure: GIVENS CAPSULE STUDY;  Surgeon: Danie Binder, MD;  Location: [facility] ENDO SUITE;  Service: Endoscopy;;  . Colonoscopy N/A 05/03/2013    XLK:GMWNUU MOST LIKELY DUE TO ONE AVM in the ascending colon/Moderate diverticulosis hroughout the entire examined colon/Two POLYPS REMOVED/Small internal hemorrhoids   Family History  Problem Relation  Age of Onset  . Colon cancer Neg Hx    Social History  Substance Use Topics  . Smoking status: Never Smoker   . Smokeless tobacco: None  . Alcohol Use: No   OB History    No data available     Review of Systems  Musculoskeletal: Negative for arthralgias.  Neurological: Negative for headaches.  All other systems reviewed and are negative.  Allergies  Penicillins; Benicar; Ciprofloxacin; Hctz; Januvia; Norvasc; and Ramipril  Home Medications   Prior to Admission medications   Medication Sig Start Date End Date Taking? Authorizing Provider  amitriptyline (ELAVIL) 50 MG tablet TAKE ONE TABLET DAILY AT BEDTIME 07/12/15  Yes Mary-Margaret Hassell Done, FNP  atorvastatin (LIPITOR) 40 MG tablet TAKE ONE (1) TABLET EACH DAY 07/12/15  Yes Mary-Margaret Hassell Done, FNP  Calcium Carbonate (CALTRATE 600 PO) Take 600 mg by mouth daily.   Yes Historical Provider, MD  carvedilol (COREG) 12.5 MG tablet TAKE ONE (1) TABLET EACH DAY 04/18/15  Yes Mary-Margaret Hassell Done, FNP  fenofibrate (TRICOR) 145 MG tablet Take 1 tablet (145 mg total) by mouth daily. 06/03/14  Yes Mary-Margaret Hassell Done, FNP  glimepiride (AMARYL) 2 MG tablet Take 1 tablet (2 mg total) by mouth daily before breakfast. 12/26/14  Yes Mary-Margaret Hassell Done, FNP  furosemide (LASIX) 20 MG tablet Take 1 tablet (20 mg total) by mouth daily. 07/19/15   Nat Christen, MD  omeprazole (PRILOSEC) 20 MG capsule 1 PO EVERY MORNING Patient not taking: Reported on 07/19/2015 12/26/14   Mary-Margaret  Hassell Done, FNP   BP 111/67 mmHg  Pulse 67  Temp(Src) 97.5 F (36.4 C) (Oral)  Resp 21  Ht 5\' 5"  (1.651 m)  Wt 165 lb (74.844 kg)  BMI 27.46 kg/m2  SpO2 100% Physical Exam  Constitutional: She appears well-developed and well-nourished. No distress.  HENT:  Head: Normocephalic and atraumatic.  Eyes: Conjunctivae and EOM are normal.  Neck: Neck supple. No tracheal deviation present.  Cardiovascular: Normal rate, regular rhythm and normal heart sounds.   Hypotensive   Pulmonary/Chest: Effort normal and breath sounds normal. No respiratory distress. She has no wheezes.  Skin: Skin is warm and dry.  Psychiatric: She has a normal mood and affect. Her behavior is normal.  Nursing note and vitals reviewed.   ED Course  Procedures  DIAGNOSTIC STUDIES: Oxygen Saturation is 100% on RA, normal by my interpretation.    COORDINATION OF CARE: 11:48 AM Discussed treatment plan with pt and her daughter, which includes decreasing Lasix to 20 mg daily. Pt agreed to plan.  Labs Review Labs Reviewed  BASIC METABOLIC PANEL - Abnormal; Notable for the following:    Sodium 133 (*)    Chloride 99 (*)    Glucose, Bld 373 (*)    BUN 26 (*)    Creatinine, Ser 1.72 (*)    GFR calc non Af Amer 26 (*)    GFR calc Af Amer 30 (*)    All other components within normal limits  CBC WITH DIFFERENTIAL/PLATELET    Imaging Review No results found.   EKG Interpretation None      Date: 07/19/2015  Rate: 70  Rhythm: atrial fib  QRS Axis: normal  Intervals: normal  ST/T Wave abnormalities: normal  Conduction Disutrbances: none  Narrative Interpretation: unremarkable    MDM   Final diagnoses:  Orthostatic hypotension    Blood pressures normalized.  Atrial fibrillation is an old finding. Patient has not eaten or drank today. She is alert and oriented in the emergency department. Hemoglobin stable. Will reduce Lasix from 40 mg a 20 mg daily in an attempt to avoid orthostatic hypotension   I personally performed the services described in this documentation, which was scribed in my presence. The recorded information has been reviewed and is accurate.        Nat Christen, MD 07/20/15 [phone]

## 2015-07-19 NOTE — Progress Notes (Signed)
Upon arrival for procrit injection pt. Julie Carpenter trying to get out of the car.  Pt. A&O x 3. Pt. Landed on her bottom & did not hit her head as stated by her daughter.  Pt. Did have small abrasion to left hand.  Pt. Helped to wheelchair & taken into SS.  VS were BP 76/46, HR 75, O2 100% on RA.  Recheck of BP was 73/50.  CBG was 243.  Pt. Stated that she took all of her daily medications but has not eaten.  CBG was 243.  Pt.'s daugter said that she has fell numerous times at home & commented that she has been dizzy at times. Told pt. & her daughter that she needed to be evaluated by ED d/t pt BP being so low & fall.  Inially pt. Refused but pt. & daughter understood the importance of getting evaluated & was fine to proceed.  Pt. Taken to the ED via [place].

## 2015-07-19 NOTE — Discharge Instructions (Signed)
Hypotension As your heart beats, it forces blood through your body. This force is called blood pressure. If you have hypotension, you have low blood pressure. When your blood pressure is too low, you may not get enough blood to your brain. You may feel weak, feel lightheaded, have a fast heartbeat, or even pass out (faint). HOME CARE  Drink enough fluids to keep your pee (urine) clear or pale yellow.  Take all medicines as told by your doctor.  Get up slowly after sitting or lying down.  Wear support stockings as told by your doctor.  Maintain a healthy diet by including foods such as fruits, vegetables, nuts, whole grains, and lean meats. GET HELP IF:  You are throwing up (vomiting) or have watery poop (diarrhea).  You have a fever for more than 2-3 days.  You feel more thirsty than usual.  You feel weak and tired. GET HELP RIGHT AWAY IF:   You pass out (faint).  You have chest pain or a fast or irregular heartbeat.  You lose feeling in part of your body.  You cannot move your arms or legs.  You have trouble speaking.  You get sweaty or feel lightheaded. MAKE SURE YOU:   Understand these instructions.  Will watch your condition.  Will get help right away if you are not doing well or get worse.   This information is not intended to replace advice given to you by your health care provider. Make sure you discuss any questions you have with your health care provider.   Document Released: 11/20/2009 Document Revised: 04/28/2013 Document Reviewed: 02/26/2013 Elsevier Interactive Patient Education 2016 Elsevier Inc[employer]  Reduce Lasix to 20 mg daily. New prescription given. Increase fluids. Eat regular meals. Try to rise slowly from a sitting or lying position.

## 2015-07-19 NOTE — ED Notes (Addendum)
Patient arrived to ED from short stay, states patient fell getting out of the car landing on her back. Denies LOC, denies head injury. States B/P 76/46, and 73/50 in short stay. Family member states patient has been "falling a lot lately." Patient denies pain at this time. Patient was here for procrit injection but did not receive treatment. Patient's CBG in short stay 243.

## 2015-07-19 NOTE — Progress Notes (Signed)
Results for Julie Carpenter, Julie Carpenter (MRN [id number]) as of 07/19/2015 13:24  Ref. Range 07/19/2015 12:28  Sodium Latest Ref Range: 135-145 mmol/L 133 (L)  Potassium Latest Ref Range: 3.5-5.1 mmol/L 4.0  Chloride Latest Ref Range: 101-111 mmol/L 99 (L)  CO2 Latest Ref Range: 22-32 mmol/L 24  BUN Latest Ref Range: 6-20 mg/dL 26 (H)  Creatinine Latest Ref Range: 0.44-1.00 mg/dL 1.72 (H)  Calcium Latest Ref Range: 8.9-10.3 mg/dL 9.3  EGFR (Non-African Amer.) Latest Ref Range: >60 mL/min 26 (L)  EGFR (African American) Latest Ref Range: >60 mL/min 30 (L)  Glucose Latest Ref Range: 65-99 mg/dL 373 (H)  Anion gap Latest Ref Range: 5-15  10  WBC Latest Ref Range: 4.0-10.5 K/uL 8.0  RBC Latest Ref Range: 3.87-5.11 MIL/uL 4.48  Hemoglobin Latest Ref Range: 12.0-15.0 g/dL 12.5  HCT Latest Ref Range: 36.0-46.0 % 38.9  MCV Latest Ref Range: 78.0-100.0 fL 86.8  MCH Latest Ref Range: 26.0-34.0 pg 27.9  MCHC Latest Ref Range: 30.0-36.0 g/dL 32.1  RDW Latest Ref Range: 11.5-15.5 % 14.4  Platelets Latest Ref Range: 150-400 K/uL 272  Neutrophils Latest Units: % 75  Lymphocytes Latest Units: % 13  Monocytes Relative Latest Units: % 8  Eosinophil Latest Units: % 3  Basophil Latest Units: % 1  NEUT# Latest Ref Range: 1.7-7.7 K/uL 6.0  Lymphocyte # Latest Ref Range: 0.7-4.0 K/uL 1.0  Monocyte # Latest Ref Range: 0.1-1.0 K/uL 0.7  Eosinophils Absolute Latest Ref Range: 0.0-0.7 K/uL 0.3  Basophils Absolute Latest Ref Range: 0.0-0.1 K/uL 0.0

## 2015-07-19 NOTE — ED Notes (Signed)
Patient ambulated to bathroom with one assist. Gait slightly unsteady during ambulation.

## 2015-07-24 ENCOUNTER — Ambulatory Visit (INDEPENDENT_AMBULATORY_CARE_PROVIDER_SITE_OTHER): Payer: Medicare Other | Admitting: Nurse Practitioner

## 2015-07-24 ENCOUNTER — Encounter: Payer: Self-pay | Admitting: Nurse Practitioner

## 2015-07-24 VITALS — BP 130/84 | HR 77 | Temp 97.0°F | Ht 65.0 in | Wt 163.0 lb

## 2015-07-24 DIAGNOSIS — K219 Gastro-esophageal reflux disease without esophagitis: Secondary | ICD-10-CM | POA: Diagnosis not present

## 2015-07-24 DIAGNOSIS — E785 Hyperlipidemia, unspecified: Secondary | ICD-10-CM

## 2015-07-24 DIAGNOSIS — I10 Essential (primary) hypertension: Secondary | ICD-10-CM | POA: Diagnosis not present

## 2015-07-24 DIAGNOSIS — E119 Type 2 diabetes mellitus without complications: Secondary | ICD-10-CM

## 2015-07-24 DIAGNOSIS — Z23 Encounter for immunization: Secondary | ICD-10-CM | POA: Diagnosis not present

## 2015-07-24 DIAGNOSIS — N182 Chronic kidney disease, stage 2 (mild): Secondary | ICD-10-CM | POA: Diagnosis not present

## 2015-07-24 DIAGNOSIS — E1142 Type 2 diabetes mellitus with diabetic polyneuropathy: Secondary | ICD-10-CM

## 2015-07-24 DIAGNOSIS — R609 Edema, unspecified: Secondary | ICD-10-CM | POA: Diagnosis not present

## 2015-07-24 LAB — POCT GLYCOSYLATED HEMOGLOBIN (HGB A1C): HEMOGLOBIN A1C: 8.7

## 2015-07-24 MED ORDER — FENOFIBRATE 145 MG PO TABS: 145.0000 mg | ORAL_TABLET | Freq: Every day | ORAL | Status: DC

## 2015-07-24 MED ORDER — CARVEDILOL 12.5 MG PO TABS: ORAL_TABLET | ORAL | Status: DC

## 2015-07-24 MED ORDER — GLIMEPIRIDE 2 MG PO TABS: 2.0000 mg | ORAL_TABLET | Freq: Every day | ORAL | Status: DC

## 2015-07-24 MED ORDER — ATORVASTATIN CALCIUM 40 MG PO TABS: ORAL_TABLET | ORAL | Status: DC

## 2015-07-24 MED ORDER — FUROSEMIDE 20 MG PO TABS: 20.0000 mg | ORAL_TABLET | Freq: Every day | ORAL | Status: DC

## 2015-07-24 MED ORDER — OMEPRAZOLE 20 MG PO CPDR: DELAYED_RELEASE_CAPSULE | ORAL | Status: DC

## 2015-07-24 MED ORDER — GLIPIZIDE 5 MG PO TABS: 5.0000 mg | ORAL_TABLET | Freq: Two times a day (BID) | ORAL | Status: DC

## 2015-07-24 NOTE — Patient Instructions (Signed)
Fall Prevention in the Home  Falls can cause injuries and can affect people from all age groups. There are many simple things that you can do to make your home safe and to help prevent falls. WHAT CAN I DO ON THE OUTSIDE OF MY HOME?  Regularly repair the edges of walkways and driveways and fix any cracks.  Remove high doorway thresholds.  Trim any shrubbery on the main path into your home.  Use bright outdoor lighting.  Clear walkways of debris and clutter, including tools and rocks.  Regularly check that handrails are securely fastened and in good repair. Both sides of any steps should have handrails.  Install guardrails along the edges of any raised decks or porches.  Have leaves, snow, and ice cleared regularly.  Use sand or salt on walkways during winter months.  In the garage, clean up any spills right away, including grease or oil spills. WHAT CAN I DO IN THE BATHROOM?  Use night lights.  Install grab bars by the toilet and in the tub and shower. Do not use towel bars as grab bars.  Use non-skid mats or decals on the floor of the tub or shower.  If you need to sit down while you are in the shower, use a plastic, non-slip stool..  Keep the floor dry. Immediately clean up any water that spills on the floor.  Remove soap buildup in the tub or shower on a regular basis.  Attach bath mats securely with double-sided non-slip rug tape.  Remove throw rugs and other tripping hazards from the floor. WHAT CAN I DO IN THE BEDROOM?  Use night lights.  Make sure that a bedside light is easy to reach.  Do not use oversized bedding that drapes onto the floor.  Have a firm chair that has side arms to use for getting dressed.  Remove throw rugs and other tripping hazards from the floor. WHAT CAN I DO IN THE KITCHEN?   Clean up any spills right away.  Avoid walking on wet floors.  Place frequently used items in easy-to-reach places.  If you need to reach for something  above you, use a sturdy step stool that has a grab bar.  Keep electrical cables out of the way.  Do not use floor polish or wax that makes floors slippery. If you have to use wax, make sure that it is non-skid floor wax.  Remove throw rugs and other tripping hazards from the floor. WHAT CAN I DO IN THE STAIRWAYS?  Do not leave any items on the stairs.  Make sure that there are handrails on both sides of the stairs. Fix handrails that are broken or loose. Make sure that handrails are as long as the stairways.  Check any carpeting to make sure that it is firmly attached to the stairs. Fix any carpet that is loose or worn.  Avoid having throw rugs at the top or bottom of stairways, or secure the rugs with carpet tape to prevent them from moving.  Make sure that you have a light switch at the top of the stairs and the bottom of the stairs. If you do not have them, have them installed. WHAT ARE SOME OTHER FALL PREVENTION TIPS?  Wear closed-toe shoes that fit well and support your feet. Wear shoes that have rubber soles or low heels.  When you use a stepladder, make sure that it is completely opened and that the sides are firmly locked. Have someone hold the ladder while you   are using it. Do not climb a closed stepladder.  Add color or contrast paint or tape to grab bars and handrails in your home. Place contrasting color strips on the first and last steps.  Use mobility aids as needed, such as canes, walkers, scooters, and crutches.  Turn on lights if it is dark. Replace any light bulbs that burn out.  Set up furniture so that there are clear paths. Keep the furniture in the same spot.  Fix any uneven floor surfaces.  Choose a carpet design that does not hide the edge of steps of a stairway.  Be aware of any and all pets.  Review your medicines with your healthcare provider. Some medicines can cause dizziness or changes in blood pressure, which increase your risk of falling. Talk  with your health care provider about other ways that you can decrease your risk of falls. This may include working with a physical therapist or trainer to improve your strength, balance, and endurance.   This information is not intended to replace advice given to you by your health care provider. Make sure you discuss any questions you have with your health care provider.   Document Released: 08/16/2002 Document Revised: 01/10/2015 Document Reviewed: 09/30/2014 Elsevier Interactive Patient Education 2016 Elsevier Inc.  

## 2015-07-24 NOTE — Progress Notes (Signed)
Subjective:    Patient ID: Julie Carpenter, female    DOB: March 15, 1926, 81 y.o.   MRN: [id number]  Patient here today for follow up of chronic medical problems.  * Patient had a fall last Wednesday while getting out of car- denies injury- went to ER   Diabetes She presents for her follow-up diabetic visit. She has type 2 diabetes mellitus. Her disease course has been fluctuating. Pertinent negatives for diabetes include no chest pain, no polydipsia, no polyphagia, no polyuria, no visual change and no weakness. Pertinent negatives for diabetic complications include no CVA. Risk factors for coronary artery disease include dyslipidemia, hypertension and post-menopausal. Current diabetic treatment includes diet. She is compliant with treatment most of the time. When asked about meal planning, she reported none. She has not had a previous visit with a dietitian. She rarely participates in exercise. Her breakfast blood glucose is taken between 9-10 am. Her breakfast blood glucose range is generally 130-140 mg/dl. Her overall blood glucose range is 140-180 mg/dl. An ACE inhibitor/angiotensin II receptor blocker is being taken. She does not see a podiatrist.Eye exam is not current.  Hypertension This is a chronic problem. The current episode started more than 1 year ago. The problem is unchanged. The problem is controlled. Pertinent negatives include no chest pain, neck pain, palpitations or shortness of breath. Risk factors for coronary artery disease include dyslipidemia and post-menopausal state. Past treatments include angiotensin blockers. Compliance problems include diet.  There is no history of CAD/MI or CVA.  Hyperlipidemia This is a chronic problem. The current episode started more than 1 year ago. The problem is resistant. Recent lipid tests were reviewed and are variable. Pertinent negatives include no chest pain or shortness of breath. Current antihyperlipidemic treatment includes statins. The  current treatment provides moderate improvement of lipids. Compliance problems include adherence to diet and adherence to exercise.  Risk factors for coronary artery disease include dyslipidemia, female sex, obesity and post-menopausal.  Back pain/osteoporosis amitriptyline- only takes at night- helps a little- hasnt been taking lately because they make her so sleepy during the day. Atrial fib Patient takes a baby asa daily- her [place] score was 3- she is doing well- no c/o palpitations. GERD Omeprazole working well to keep symptoms under control CKD Currently watching      Review of Systems  Constitutional: Negative.   HENT: Negative.   Respiratory: Negative for shortness of breath.   Cardiovascular: Negative for chest pain and palpitations.  Endocrine: Negative for polydipsia, polyphagia and polyuria.  Musculoskeletal: Negative for neck pain.  Neurological: Negative for weakness.  All other systems reviewed and are negative.      Objective:   Physical Exam  Constitutional: She is oriented to person, place, and time. She appears well-developed and well-nourished.  HENT:  Nose: Nose normal.  Mouth/Throat: Oropharynx is clear and moist.  Eyes: EOM are normal.  Neck: Trachea normal, normal range of motion and full passive range of motion without pain. Neck supple. No JVD present. Carotid bruit is not present. No thyromegaly present.  Cardiovascular: Normal rate, normal heart sounds and intact distal pulses.  Exam reveals no gallop and no friction rub.   No murmur heard. Irregular rhythym  Pulmonary/Chest: Effort normal and breath sounds normal.  Abdominal: Soft. Bowel sounds are normal. She exhibits no distension and no mass. There is no tenderness.  Musculoskeletal: Normal range of motion.  Lymphadenopathy:    She has no cervical adenopathy.  Neurological: She is alert and oriented to person,  place, and time. She has normal reflexes.  Skin: Skin is warm and dry.  Psychiatric:  She has a normal mood and affect. Her behavior is normal. Judgment and thought content normal.    BP 130/84 mmHg  Pulse 77  Temp(Src) 97 F (36.1 C) (Oral)  Ht 5' 5"  (1.651 m)  Wt 163 lb (73.936 kg)  BMI 27.12 kg/m2   Results for orders placed or performed in visit on 07/24/15  POCT glycosylated hemoglobin (Hb A1C)  Result Value Ref Range   Hemoglobin A1C 8.7            Assessment & Plan:  1. Essential hypertension Do not add salt to diet - CMP14+EGFR  2. Hyperlipemia Low fta diet - Lipid panel  3. Type 2 diabetes mellitus without complication, without long-term current use of insulin (HCC) Continue to watch carbs in diet Added glipizide 61m daily to meds- watch for low blood sugars - POCT glycosylated hemoglobin (Hb A1C)  4. CKD (chronic kidney disease), stage 2 (mild) Will continue to watch  5. Gastroesophageal reflux disease without esophagitis Avoid spicy foods Do not eat 2 hours prior to bedtime    Counseling done on all immunizations given today- flu Labs pending Health maintenance reviewed Diet and exercise encouraged Continue all meds Follow up  In 3 month   [place] FNP

## 2015-07-24 NOTE — Addendum Note (Signed)
Addended by: Chevis Pretty on: 07/24/2015 02:50 PM   Modules accepted: Orders

## 2015-07-25 LAB — CMP14+EGFR
A/G RATIO: 1.4 (ref 1.1–2.5)
ALK PHOS: 147 IU/L — AB (ref 39–117)
ALT: 13 IU/L (ref 0–32)
AST: 23 IU/L (ref 0–40)
Albumin: 3.9 g/dL (ref 3.5–4.7)
BILIRUBIN TOTAL: 0.6 mg/dL (ref 0.0–1.2)
BUN / CREAT RATIO: 22 (ref 11–26)
BUN: 32 mg/dL — ABNORMAL HIGH (ref 8–27)
CHLORIDE: 99 mmol/L (ref 97–106)
CO2: 21 mmol/L (ref 18–29)
Calcium: 9.4 mg/dL (ref 8.7–10.3)
Creatinine, Ser: 1.45 mg/dL — ABNORMAL HIGH (ref 0.57–1.00)
GFR calc non Af Amer: 32 mL/min/{1.73_m2} — ABNORMAL LOW (ref >59)
GFR, EST AFRICAN AMERICAN: 37 mL/min/{1.73_m2} — AB (ref >59)
Globulin, Total: 2.8 g/dL (ref 1.5–4.5)
Glucose: 322 mg/dL — ABNORMAL HIGH (ref 65–99)
POTASSIUM: 4.5 mmol/L (ref 3.5–5.2)
SODIUM: 136 mmol/L (ref 136–144)
Total Protein: 6.7 g/dL (ref 6.0–8.5)

## 2015-07-25 LAB — LIPID PANEL
CHOLESTEROL TOTAL: 177 mg/dL (ref 100–199)
Chol/HDL Ratio: 5.2 ratio units — ABNORMAL HIGH (ref 0.0–4.4)
HDL: 34 mg/dL — ABNORMAL LOW (ref >39)
LDL Calculated: 100 mg/dL — ABNORMAL HIGH (ref 0–99)
Triglycerides: 215 mg/dL — ABNORMAL HIGH (ref 0–149)
VLDL CHOLESTEROL CAL: 43 mg/dL — AB (ref 5–40)

## 2015-08-02 ENCOUNTER — Encounter (HOSPITAL_COMMUNITY)
Admission: RE | Admit: 2015-08-02 | Discharge: 2015-08-02 | Disposition: A | Discharge status: Home / Self Care | Payer: Medicare Other | Source: Ambulatory Visit | Attending: Nephrology | Admitting: Nephrology

## 2015-08-02 DIAGNOSIS — N184 Chronic kidney disease, stage 4 (severe): Secondary | ICD-10-CM | POA: Diagnosis not present

## 2015-08-02 LAB — HEMOGLOBIN AND HEMATOCRIT, BLOOD
HCT: 34.3 % — ABNORMAL LOW (ref 36.0–46.0)
Hemoglobin: 10.9 g/dL — ABNORMAL LOW (ref 12.0–15.0)

## 2015-08-02 MED ORDER — EPOETIN ALFA 20000 UNIT/ML IJ SOLN
20000.0000 [IU] | INTRAMUSCULAR | Status: DC
  Administered 2015-08-02: 20000 [IU] via SUBCUTANEOUS

## 2015-08-02 MED ORDER — EPOETIN ALFA 20000 UNIT/ML IJ SOLN
INTRAMUSCULAR | Status: AC
  Filled 2015-08-02: qty 1

## 2015-08-08 ENCOUNTER — Telehealth: Payer: Self-pay | Admitting: Nurse Practitioner

## 2015-08-08 NOTE — Telephone Encounter (Signed)
Refer to Julie Carpenter to discuss diabetic meds

## 2015-08-08 NOTE — Telephone Encounter (Signed)
Patients daughter states that she went to see kidney specialist yesterday and he feels like she shouldn't be on glimepiride, and glipizide that they were to similar of a drug. Her bs have still been elevated between 160-260 non fasting right after she finishes eating. Please advise

## 2015-08-09 NOTE — Telephone Encounter (Signed)
Patient has an appointment to see pharm - d.

## 2015-08-10 ENCOUNTER — Encounter: Payer: Self-pay | Admitting: Pharmacist

## 2015-08-10 ENCOUNTER — Ambulatory Visit (INDEPENDENT_AMBULATORY_CARE_PROVIDER_SITE_OTHER): Payer: Medicare Other | Admitting: Pharmacist

## 2015-08-10 VITALS — BP 115/72 | HR 72 | Ht 65.0 in | Wt 164.0 lb

## 2015-08-10 DIAGNOSIS — E1142 Type 2 diabetes mellitus with diabetic polyneuropathy: Secondary | ICD-10-CM | POA: Diagnosis not present

## 2015-08-10 DIAGNOSIS — I10 Essential (primary) hypertension: Secondary | ICD-10-CM

## 2015-08-10 MED ORDER — CARVEDILOL 12.5 MG PO TABS: 6.2500 mg | ORAL_TABLET | Freq: Two times a day (BID) | ORAL | Status: DC

## 2015-08-10 MED ORDER — GLIMEPIRIDE 2 MG PO TABS: 3.0000 mg | ORAL_TABLET | Freq: Every day | ORAL | Status: DC

## 2015-08-10 NOTE — Patient Instructions (Signed)
Stop glipizide 5mg    Increase glimepiride 2mg  to 1 and 1/2 tablets or 3mg  each morning with breakfast.  If blood glucose if still over 150 in the morning then increase to 2 tablets or 4mg  each morning.   Change carvedilol 12.5mg  to 1/2 tablet twice a day with food.

## 2015-08-10 NOTE — Progress Notes (Signed)
Subjective:    Julie Carpenter is a 81 y.o. female who presents for an initial evaluation of Type 2 diabetes mellitus and for diabetes education.   Current HBG range is 79 to 281.  Most readings in the 160's She is currently only taking glipizide 5mg  1/2 tablet bid but also has glimepiride 2mg  daily on her medication list.  She was seen by her nephrologist 08/07/2015 who urged her to come in as she was taking both glipizide and glimepiride - both are sulfonylureas.   Her last GFR was 32  The patient was initially diagnosed with Type 2 diabetes mellitus about 30 years ago.  Known diabetic complications: nephropathy and peripheral neuropathy Cardiovascular risk factors: advanced age (older than 36 for men, 53 for women), diabetes mellitus, dyslipidemia and hypertension   Eye exam current (within one year): unknown Weight trend: stable Prior visit with dietician: no Current diet: in general, an "unhealthy" diet Current exercise: none  Any episodes of hypoglycemia? No but patient's caregiver reports that she has fallen 3 times over the last 6 months with 1 fall occurring at the short stay office at [facility] ([place] where she gets Procrit administered.  She was sent to ER for evaluation.  Furosemide was decreased by ER physician from 40mg  to 20mg  daily.   Her BP at ER was 121/73 and pulse 67.  Is She on ACE inhibitor or angiotensin II receptor blocker?  No   The following portions of the patient's history were reviewed and updated as appropriate: allergies, current medications, past family history, past medical history, past social history, past surgical history and problem list.    Objective:    BP 115/72 mmHg  Pulse 72  Ht 5\' 5"  (1.651 m)  Wt 164 lb (74.39 kg)  BMI 27.29 kg/m2  Last A1c was 8.7% (07/24/2015)  Lab Review GLUCOSE (mg/dL)  Date Value  07/24/2015 322*  12/26/2014 119*  05/30/2014 191*   GLUCOSE, BLD (mg/dL)  Date Value  07/19/2015 373*  04/02/2013 258*  02/24/2013  167*   CO2 (mmol/L)  Date Value  07/24/2015 21  07/19/2015 24  12/26/2014 19   BUN (mg/dL)  Date Value  07/24/2015 32*  07/19/2015 26*  12/26/2014 20  05/30/2014 28*  04/02/2013 39*  02/24/2013 42*   CREAT (mg/dL)  Date Value  02/24/2013 2.10*  11/23/2012 1.82*   CREATININE, SER (mg/dL)  Date Value  07/24/2015 1.45*  07/19/2015 1.72*  12/26/2014 1.45*   Assessment:    Diabetes Mellitus type II, under inadequate control.   HTN Frequent falls / high fall risk - possibly related to hypotension Medication management Start 3 renal dysfunction  Plan:    1.  Rx changes:  Discontinue glipizide  Increase glimepiride 2mg  to 1.5 tablets (or 30mg ) once a day for 7 days, if FBG is still over 150 then increase to 4mg  daily or 2 tablets.  Will consider Tradjenta in future if further BG lowering needed and it does not need to be adjusted for renal function   Change carvedilol 12.5mg  from 1 tablet qd to 1/2 tablet bid  2.  Education: Reviewed 'ABCs' of diabetes management (respective goals in parentheses):  A1C (<7), blood pressure (<130/80), and cholesterol (LDL <100). 3.  Discussed types of foods with high CHO content and recommended serving sizes.  4.  RTC in 1 month to assess BG and BP.   Cherre Robins, PharmD, CPP, CDE

## 2015-08-16 ENCOUNTER — Ambulatory Visit (HOSPITAL_COMMUNITY): Payer: Medicare Other

## 2015-08-17 ENCOUNTER — Observation Stay (HOSPITAL_COMMUNITY)
Admission: EM | Admit: 2015-08-17 | Discharge: 2015-08-18 | Disposition: A | Discharge status: Home / Self Care | Payer: Medicare Other | Attending: Internal Medicine | Admitting: Internal Medicine

## 2015-08-17 ENCOUNTER — Emergency Department (HOSPITAL_COMMUNITY): Payer: Medicare Other

## 2015-08-17 ENCOUNTER — Ambulatory Visit (INDEPENDENT_AMBULATORY_CARE_PROVIDER_SITE_OTHER): Payer: Medicare Other | Admitting: Pediatrics

## 2015-08-17 ENCOUNTER — Encounter: Payer: Self-pay | Admitting: Pediatrics

## 2015-08-17 ENCOUNTER — Encounter (HOSPITAL_COMMUNITY): Payer: Self-pay | Admitting: *Deleted

## 2015-08-17 VITALS — BP 162/120 | HR 112 | Temp 97.1°F | Ht 65.0 in | Wt 169.0 lb

## 2015-08-17 DIAGNOSIS — R0600 Dyspnea, unspecified: Secondary | ICD-10-CM | POA: Diagnosis not present

## 2015-08-17 DIAGNOSIS — E0801 Diabetes mellitus due to underlying condition with hyperosmolarity with coma: Secondary | ICD-10-CM | POA: Diagnosis not present

## 2015-08-17 DIAGNOSIS — IMO0001 Reserved for inherently not codable concepts without codable children: Secondary | ICD-10-CM

## 2015-08-17 DIAGNOSIS — E119 Type 2 diabetes mellitus without complications: Secondary | ICD-10-CM | POA: Insufficient documentation

## 2015-08-17 DIAGNOSIS — R0609 Other forms of dyspnea: Secondary | ICD-10-CM

## 2015-08-17 DIAGNOSIS — R7989 Other specified abnormal findings of blood chemistry: Secondary | ICD-10-CM

## 2015-08-17 DIAGNOSIS — N179 Acute kidney failure, unspecified: Secondary | ICD-10-CM | POA: Diagnosis not present

## 2015-08-17 DIAGNOSIS — I482 Chronic atrial fibrillation: Secondary | ICD-10-CM

## 2015-08-17 DIAGNOSIS — R531 Weakness: Secondary | ICD-10-CM | POA: Insufficient documentation

## 2015-08-17 DIAGNOSIS — R030 Elevated blood-pressure reading, without diagnosis of hypertension: Secondary | ICD-10-CM

## 2015-08-17 DIAGNOSIS — I4891 Unspecified atrial fibrillation: Secondary | ICD-10-CM | POA: Diagnosis present

## 2015-08-17 DIAGNOSIS — I499 Cardiac arrhythmia, unspecified: Secondary | ICD-10-CM | POA: Diagnosis not present

## 2015-08-17 DIAGNOSIS — I10 Essential (primary) hypertension: Secondary | ICD-10-CM | POA: Insufficient documentation

## 2015-08-17 DIAGNOSIS — N1832 Chronic kidney disease, stage 3b: Secondary | ICD-10-CM | POA: Diagnosis present

## 2015-08-17 DIAGNOSIS — R0602 Shortness of breath: Secondary | ICD-10-CM | POA: Diagnosis present

## 2015-08-17 DIAGNOSIS — Z880 Allergy status to penicillin: Secondary | ICD-10-CM | POA: Diagnosis not present

## 2015-08-17 DIAGNOSIS — Z794 Long term (current) use of insulin: Secondary | ICD-10-CM

## 2015-08-17 DIAGNOSIS — N183 Chronic kidney disease, stage 3 (moderate): Secondary | ICD-10-CM

## 2015-08-17 DIAGNOSIS — Z79899 Other long term (current) drug therapy: Secondary | ICD-10-CM | POA: Diagnosis not present

## 2015-08-17 DIAGNOSIS — E785 Hyperlipidemia, unspecified: Secondary | ICD-10-CM | POA: Insufficient documentation

## 2015-08-17 LAB — CBC WITH DIFFERENTIAL/PLATELET
BASOS ABS: 0 10*3/uL (ref 0.0–0.1)
Basophils Relative: 0 %
EOS ABS: 0.1 10*3/uL (ref 0.0–0.7)
Eosinophils Relative: 1 %
HCT: 35.8 % — ABNORMAL LOW (ref 36.0–46.0)
HEMOGLOBIN: 11.7 g/dL — AB (ref 12.0–15.0)
LYMPHS ABS: 1 10*3/uL (ref 0.7–4.0)
LYMPHS PCT: 12 %
MCH: 28.3 pg (ref 26.0–34.0)
MCHC: 32.7 g/dL (ref 30.0–36.0)
MCV: 86.5 fL (ref 78.0–100.0)
Monocytes Absolute: 0.8 10*3/uL (ref 0.1–1.0)
Monocytes Relative: 9 %
NEUTROS PCT: 78 %
Neutro Abs: 6.4 10*3/uL (ref 1.7–7.7)
Platelets: 244 10*3/uL (ref 150–400)
RBC: 4.14 MIL/uL (ref 3.87–5.11)
RDW: 15.1 % (ref 11.5–15.5)
WBC: 8.3 10*3/uL (ref 4.0–10.5)

## 2015-08-17 LAB — COMPREHENSIVE METABOLIC PANEL
ALT: 21 U/L (ref 14–54)
ANION GAP: 12 (ref 5–15)
AST: 33 U/L (ref 15–41)
Albumin: 3.8 g/dL (ref 3.5–5.0)
Alkaline Phosphatase: 109 U/L (ref 38–126)
BUN: 30 mg/dL — ABNORMAL HIGH (ref 6–20)
CHLORIDE: 103 mmol/L (ref 101–111)
CO2: 21 mmol/L — AB (ref 22–32)
Calcium: 9.3 mg/dL (ref 8.9–10.3)
Creatinine, Ser: 2.12 mg/dL — ABNORMAL HIGH (ref 0.44–1.00)
GFR calc non Af Amer: 20 mL/min — ABNORMAL LOW (ref >60)
GFR, EST AFRICAN AMERICAN: 23 mL/min — AB (ref >60)
Glucose, Bld: 305 mg/dL — ABNORMAL HIGH (ref 65–99)
Potassium: 4 mmol/L (ref 3.5–5.1)
SODIUM: 136 mmol/L (ref 135–145)
Total Bilirubin: 0.7 mg/dL (ref 0.3–1.2)
Total Protein: 7.4 g/dL (ref 6.5–8.1)

## 2015-08-17 LAB — TROPONIN I: TROPONIN I: 0.12 ng/mL — AB (ref <0.031)

## 2015-08-17 LAB — TSH: TSH: 0.864 u[IU]/mL (ref 0.350–4.500)

## 2015-08-17 LAB — BRAIN NATRIURETIC PEPTIDE: B NATRIURETIC PEPTIDE 5: 774 pg/mL — AB (ref 0.0–100.0)

## 2015-08-17 MED ORDER — CALCIUM CARBONATE 1250 (500 CA) MG PO TABS
1250.0000 mg | ORAL_TABLET | Freq: Every day | ORAL | Status: DC
Start: 2015-08-18 — End: 2015-08-19
  Administered 2015-08-18: 1250 mg via ORAL
  Filled 2015-08-17: qty 1

## 2015-08-17 MED ORDER — FENOFIBRATE 160 MG PO TABS
160.0000 mg | ORAL_TABLET | Freq: Every day | ORAL | Status: DC
  Administered 2015-08-18: 160 mg via ORAL
  Filled 2015-08-17: qty 1

## 2015-08-17 MED ORDER — HEPARIN SODIUM (PORCINE) 5000 UNIT/ML IJ SOLN
5000.0000 [IU] | Freq: Three times a day (TID) | INTRAMUSCULAR | Status: DC
  Administered 2015-08-17 – 2015-08-18 (×3): 5000 [IU] via SUBCUTANEOUS
  Filled 2015-08-17 (×3): qty 1

## 2015-08-17 MED ORDER — ASPIRIN EC 81 MG PO TBEC
81.0000 mg | DELAYED_RELEASE_TABLET | Freq: Every day | ORAL | Status: DC
  Administered 2015-08-18: 81 mg via ORAL
  Filled 2015-08-17: qty 1

## 2015-08-17 MED ORDER — CARVEDILOL 3.125 MG PO TABS: 6.2500 mg | ORAL_TABLET | Freq: Two times a day (BID) | ORAL | Status: DC

## 2015-08-17 MED ORDER — CALCITRIOL 0.25 MCG PO CAPS
0.2500 ug | ORAL_CAPSULE | ORAL | Status: DC
  Administered 2015-08-18: 0.25 ug via ORAL

## 2015-08-17 MED ORDER — SODIUM CHLORIDE 0.9 % IJ SOLN
3.0000 mL | Freq: Two times a day (BID) | INTRAMUSCULAR | Status: DC
  Administered 2015-08-17: 3 mL via INTRAVENOUS

## 2015-08-17 MED ORDER — AMITRIPTYLINE HCL 25 MG PO TABS
50.0000 mg | ORAL_TABLET | Freq: Every day | ORAL | Status: DC
  Administered 2015-08-17: 50 mg via ORAL
  Filled 2015-08-17: qty 2

## 2015-08-17 MED ORDER — FUROSEMIDE 20 MG PO TABS
20.0000 mg | ORAL_TABLET | Freq: Every day | ORAL | Status: DC
  Administered 2015-08-18: 20 mg via ORAL
  Filled 2015-08-17: qty 1

## 2015-08-17 MED ORDER — NITROGLYCERIN 2 % TD OINT
1.0000 [in_us] | TOPICAL_OINTMENT | Freq: Once | TRANSDERMAL | Status: AC
  Administered 2015-08-17: 1 [in_us] via TOPICAL
  Filled 2015-08-17: qty 1

## 2015-08-17 MED ORDER — ATORVASTATIN CALCIUM 40 MG PO TABS
40.0000 mg | ORAL_TABLET | Freq: Every day | ORAL | Status: DC
  Administered 2015-08-17 – 2015-08-18 (×2): 40 mg via ORAL
  Filled 2015-08-17: qty 1

## 2015-08-17 MED ORDER — FUROSEMIDE 10 MG/ML IJ SOLN
30.0000 mg | Freq: Once | INTRAMUSCULAR | Status: AC
  Administered 2015-08-17: 30 mg via INTRAVENOUS
  Filled 2015-08-17: qty 4

## 2015-08-17 MED ORDER — PANTOPRAZOLE SODIUM 40 MG PO TBEC
40.0000 mg | DELAYED_RELEASE_TABLET | Freq: Every day | ORAL | Status: DC
  Administered 2015-08-18: 40 mg via ORAL
  Filled 2015-08-17: qty 1

## 2015-08-17 NOTE — ED Notes (Signed)
Pt ambulated to restroom & returned to room w/ no complications. 

## 2015-08-17 NOTE — ED Provider Notes (Signed)
CSN: [id number]     Arrival date & time 08/17/15  1549 History   First MD Initiated Contact with Patient 08/17/15 1609     Chief Complaint  Patient presents with  . Shortness of Breath    HPI  Patient presents with her daughter who assists with the history of present illness. Symptoms of the onset was about 40 hours ago, since onset symptoms of been persistent. No new pain, but with minimal amounts of exertion, the patient has fatigue, dyspnea. No recent new medication, diet, but patient has really decreased her Lasix dosing.   Past Medical History  Diagnosis Date  . Hyperlipidemia   . Hypertension   . Diabetes mellitus without complication ([place])   . Atrial fibrillation [facility])    Past Surgical History  Procedure Laterality Date  . Abdominal hysterectomy    . Cholecystectomy    . Bladder tack    . Esophagogastroduodenoscopy N/A 04/06/2013    Procedure: ESOPHAGOGASTRODUODENOSCOPY (EGD);  Surgeon: Danie Binder, MD;  Location: [facility];  Service: Endoscopy;  Laterality: N/A;  8:45  . Givens capsule study  04/06/2013    Procedure: GIVENS CAPSULE STUDY;  Surgeon: Danie Binder, MD;  Location: [facility];  Service: Endoscopy;;  . Colonoscopy N/A 05/03/2013    [id number] MOST LIKELY DUE TO ONE AVM in the ascending colon/Moderate diverticulosis hroughout the entire examined colon/Two POLYPS REMOVED/Small internal hemorrhoids   Family History  Problem Relation Age of Onset  . Colon cancer Neg Hx    Social History  Substance Use Topics  . Smoking status: Never Smoker   . Smokeless tobacco: None  . Alcohol Use: No   OB History    No data available     Review of Systems  Constitutional:       Per HPI, otherwise negative  HENT:       Per HPI, otherwise negative  Respiratory:       Per HPI, otherwise negative  Cardiovascular:       Per HPI, otherwise negative  Gastrointestinal: Negative for vomiting.  Endocrine:       Negative aside from HPI  Genitourinary:   Neg aside from HPI   Musculoskeletal:       Per HPI, otherwise negative  Skin: Negative.   Neurological: Positive for weakness. Negative for syncope.      Allergies  Penicillins; Benicar; Ciprofloxacin; Hctz; Januvia; Norvasc; and Ramipril  Home Medications   Prior to Admission medications   Medication Sig Start Date End Date Taking? Authorizing Provider  amitriptyline (ELAVIL) 50 MG tablet TAKE ONE TABLET DAILY AT BEDTIME 07/12/15  Yes Mary-Margaret Hassell Done, FNP  atorvastatin (LIPITOR) 40 MG tablet TAKE ONE (1) TABLET EACH DAY 07/24/15  Yes Mary-Margaret Hassell Done, FNP  calcitRIOL (ROCALTROL) 0.25 MCG capsule Take 0.25 mcg by mouth 3 (three) times a week. Mon.,Wed.,Fri. 07/11/15  Yes Historical Provider, MD  Calcium Carbonate (CALTRATE 600 PO) Take 600 mg by mouth daily.   Yes Historical Provider, MD  carvedilol (COREG) 12.5 MG tablet Take 0.5 tablets (6.25 mg total) by mouth 2 (two) times daily with a meal. 08/10/15  Yes Tammy Eckard, PHARMD  fenofibrate (TRICOR) 145 MG tablet Take 1 tablet (145 mg total) by mouth daily. 07/24/15  Yes Mary-Margaret Hassell Done, FNP  furosemide (LASIX) 20 MG tablet Take 1 tablet (20 mg total) by mouth daily. 07/24/15  Yes Mary-Margaret Hassell Done, FNP  glimepiride (AMARYL) 2 MG tablet Take 1.5 tablets (3 mg total) by mouth daily before breakfast. 08/10/15  Yes  Tammy Eckard, PHARMD  epoetin alfa (EPOGEN,PROCRIT) 2000 UNIT/ML injection 2,000 Units 3 (three) times a week. Patient has procrit administered at [facility] short stay every 3 weeks    Historical Provider, MD   BP 163/108 mmHg  Pulse 105  Temp(Src) 97.5 F (36.4 C) (Oral)  Resp 15  Ht 5\' 5"  (1.651 m)  Wt 169 lb (76.658 kg)  BMI 28.12 kg/m2  SpO2 96% Physical Exam  Constitutional: She is oriented to person, place, and time. She appears well-developed and well-nourished. No distress.  HENT:  Head: Normocephalic and atraumatic.  Eyes: Conjunctivae and EOM are normal.  Cardiovascular: An irregularly  irregular rhythm present. Tachycardia present.   Pulmonary/Chest: No stridor. She has decreased breath sounds.  Abdominal: She exhibits no distension.  Musculoskeletal: She exhibits no edema.  Neurological: She is alert and oriented to person, place, and time. No cranial nerve deficit.  Skin: Skin is warm and dry.  Psychiatric: She has a normal mood and affect.  Nursing note and vitals reviewed.   ED Course  Procedures (including critical care time) Labs Review Labs Reviewed  COMPREHENSIVE METABOLIC PANEL  BRAIN NATRIURETIC PEPTIDE  CBC WITH DIFFERENTIAL/PLATELET    Imaging Review No results found. I have personally reviewed and evaluated these images and lab results as part of my medical decision-making.   EKG Interpretation   Date/Time:  Thursday August 17 2015 16:17:04 EST Ventricular Rate:  115 PR Interval:    QRS Duration: 107 QT Interval:  387 QTC Calculation: 535 R Axis:   112 Text Interpretation:  Atrial fibrillation Right axis deviation Low  voltage, extremity and precordial leads Borderline ST depression, lateral  leads Prolonged QT interval Baseline wander in lead(s) I III aVL Atrial  fibrillation ST-t wave abnormality QT prolonged Abnormal ekg Confirmed by  Carmin Muskrat  MD [phone]) on 08/17/2015 4:35:21 PM     Chart review notable for history of atrial fibrillation  On repeat exam the patient in similar condition. I discussed all findings with the patient's family members and her at length. With persistent dyspnea, acute kidney injury, elevated BNP, the patient received Lasix, but kidney function will require monitoring. Patient is also now on 2 L nasal cannula to maintain 100% oxygen saturation.  MDM  Elderly female with multiple medical problems, including CKD, CHF, A. fib now presents with dyspnea on exertion. With supplemental oxygen the patient improves, and at rest she is largely asymptomatic. The patient does have evidence for acute kidney  injury, with concern for elevated BNP, dyspnea on exertion, mild hypoxia, the patient received additional Lasix dosing, was admitted for further evaluation and management.  Carmin Muskrat, MD 08/17/15 [phone]

## 2015-08-17 NOTE — H&P (Signed)
[facility] History and Physical  MARSHAI OKUNO [id number] DOB: [birth year]    PCP:   Chevis Pretty, FNP   Chief Complaint: DOE.   HPI: RAGAD ROTHROCK is an 81 y.o. female with hx of CKD with baseline Cr about 1.5, hx of DM, HTN, HLD, afib with lower GI bleed unable to tolerate anticoagulation ( Colonsocopy with AVM and tics--Dr Fields-2014), sent in from her PCP for increase DOE this past week, along with EKG showing Afib and T wave changes. She was sent to the ER for ACS r/out.   She denied chest pain, fever, chills, nausea or vomiting.  Her Work up in the ER showed afib with no ST T segment depressions, Cr of 2.12, clear CXR, and BNP of 774.  She was given IV 40mg  Lasix by EDP, and hospitalist was asked to admit her for CHF>      Rewiew of Systems:  Constitutional: Negative for malaise, fever and chills. No significant weight loss or weight gain Eyes: Negative for eye pain, redness and discharge, diplopia, visual changes, or flashes of light. ENMT: Negative for ear pain, hoarseness, nasal congestion, sinus pressure and sore throat. No headaches; tinnitus, drooling, or problem swallowing. Cardiovascular: Negative for chest pain, palpitations, diaphoresis,  and peripheral edema. ; No orthopnea, PND Respiratory: Negative for cough, hemoptysis, wheezing and stridor. No pleuritic chestpain. Gastrointestinal: Negative for nausea, vomiting, diarrhea, constipation, abdominal pain, melena, blood in stool, hematemesis, jaundice and rectal bleeding.    Genitourinary: Negative for frequency, dysuria, incontinence,flank pain and hematuria; Musculoskeletal: Negative for back pain and neck pain. Negative for swelling and trauma.;  Skin: . Negative for pruritus, rash, abrasions, bruising and skin lesion.; ulcerations Neuro: Negative for headache, lightheadedness and neck stiffness. Negative for weakness, altered level of consciousness , altered mental status, extremity weakness, burning  feet, involuntary movement, seizure and syncope.  Psych: negative for anxiety, depression, insomnia, tearfulness, panic attacks, hallucinations, paranoia, suicidal or homicidal ideation    Past Medical History  Diagnosis Date  . Hyperlipidemia   . Hypertension   . Diabetes mellitus without complication ([place])   . Atrial fibrillation [facility])     Past Surgical History  Procedure Laterality Date  . Abdominal hysterectomy    . Cholecystectomy    . Bladder tack    . Esophagogastroduodenoscopy N/A 04/06/2013    Procedure: ESOPHAGOGASTRODUODENOSCOPY (EGD);  Surgeon: Danie Binder, MD;  [facility];  Service: Endoscopy;  Laterality: N/A;  8:45  . Givens capsule study  04/06/2013    Procedure: GIVENS CAPSULE STUDY;  Surgeon: Danie Binder, MD;  Location: [facility];  Service: Endoscopy;;  . Colonoscopy N/A 05/03/2013    [id number] MOST LIKELY DUE TO ONE AVM in the ascending colon/Moderate diverticulosis hroughout the entire examined colon/Two POLYPS REMOVED/Small internal hemorrhoids    Medications:  HOME MEDS: Prior to Admission medications   Medication Sig Start Date End Date Taking? Authorizing Provider  amitriptyline (ELAVIL) 50 MG tablet TAKE ONE TABLET DAILY AT BEDTIME 07/12/15  Yes Mary-Margaret Hassell Done, FNP  atorvastatin (LIPITOR) 40 MG tablet TAKE ONE (1) TABLET EACH DAY 07/24/15  Yes Mary-Margaret Hassell Done, FNP  calcitRIOL (ROCALTROL) 0.25 MCG capsule Take 0.25 mcg by mouth 3 (three) times a week. Mon.,Wed.,Fri. 07/11/15  Yes Historical Provider, MD  Calcium Carbonate (CALTRATE 600 PO) Take 600 mg by mouth daily.   Yes Historical Provider, MD  carvedilol (COREG) 12.5 MG tablet Take 0.5 tablets (6.25 mg total) by mouth 2 (two) times daily with a meal. 08/10/15  Yes Tammy Eckard, PHARMD  fenofibrate (TRICOR) 145 MG tablet Take 1 tablet (145 mg total) by mouth daily. 07/24/15  Yes Mary-Margaret Hassell Done, FNP  furosemide (LASIX) 20 MG tablet Take 1 tablet (20 mg total) by mouth  daily. 07/24/15  Yes Mary-Margaret Hassell Done, FNP  glimepiride (AMARYL) 2 MG tablet Take 1.5 tablets (3 mg total) by mouth daily before breakfast. 08/10/15  Yes Tammy Eckard, PHARMD  epoetin alfa (EPOGEN,PROCRIT) 2000 UNIT/ML injection 2,000 Units 3 (three) times a week. Patient has procrit administered at [facility] short stay every 3 weeks    Historical Provider, MD     Allergies:  Allergies  Allergen Reactions  . Penicillins Anaphylaxis    Has patient had a PCN reaction causing immediate rash, facial/tongue/throat swelling, SOB or lightheadedness with hypotension: Yes Has patient had a PCN reaction causing severe rash involving mucus membranes or skin necrosis: No Has patient had a PCN reaction that required hospitalization No Has patient had a PCN reaction occurring within the last 10 years: No If all of the above answers are "NO", then may proceed with Cephalosporin use.  Marland Kitchen Benicar [Olmesartan Medoxomil] Other (See Comments)    Lips swelling  . Ciprofloxacin Swelling  . Hctz [Hydrochlorothiazide] Other (See Comments)    Lips swelling  . Januvia [Sitagliptin Phosphate] Other (See Comments)    Lips swelling  . Norvasc [Amlodipine Besylate] Other (See Comments)    Lips swelling  . Ramipril Other (See Comments)    Lips swelling    Social History:   reports that she has never smoked. She does not have any smokeless tobacco history on file. She reports that she does not drink alcohol or use illicit drugs.  Family History: Family History  Problem Relation Age of Onset  . Colon cancer Neg Hx      Physical Exam: Filed Vitals:   08/17/15 1845 08/17/15 1900 08/17/15 1934 08/17/15 2035  BP:  147/98 176/126 159/119  Pulse: 95 98 116 91  Temp:      TempSrc:      Resp: 15 22 22 19   Height:      Weight:      SpO2: 100% 100% 100% 99%   Blood pressure 159/119, pulse 91, temperature 97.5 F (36.4 C), temperature source Oral, resp. rate 19, height 5\' 5"  (1.651 m), weight 76.658 kg  (169 lb), SpO2 99 %.  GEN:  Pleasant patient lying in the stretcher in no acute distress; cooperative with exam. PSYCH:  alert and oriented x4; does not appear anxious or depressed; affect is appropriate. HEENT: Mucous membranes pink and anicteric; PERRLA; EOM intact; no cervical lymphadenopathy nor thyromegaly or carotid bruit; no JVD; There were no stridor. Neck is very supple. Breasts:: Not examined CHEST WALL: No tenderness CHEST: Normal respiration, clear to auscultation bilaterally.  HEART: Regular rate and rhythm.  There are no murmur, rub, or gallops.   BACK: No kyphosis or scoliosis; no CVA tenderness ABDOMEN: soft and non-tender; no masses, no organomegaly, normal abdominal bowel sounds; no pannus; no intertriginous candida. There is no rebound and no distention. Rectal Exam: Not done EXTREMITIES: No bone or joint deformity; age-appropriate arthropathy of the hands and knees; no edema; no ulcerations.  There is no calf tenderness. Genitalia: not examined PULSES: 2+ and symmetric SKIN: Normal hydration no rash or ulceration CNS: Cranial nerves 2-12 grossly intact no focal lateralizing neurologic deficit.  Speech is fluent; uvula elevated with phonation, facial symmetry and tongue midline. DTR are normal bilaterally, cerebella exam is intact, barbinski  is negative and strengths are equaled bilaterally.  No sensory loss.   Labs on Admission:  Basic Metabolic Panel:  Recent Labs Lab 08/17/15 1752  NA 136  K 4.0  CL 103  CO2 21*  GLUCOSE 305*  BUN 30*  CREATININE 2.12*  CALCIUM 9.3   Liver Function Tests:  Recent Labs Lab 08/17/15 1752  AST 33  ALT 21  ALKPHOS 109  BILITOT 0.7  PROT 7.4  ALBUMIN 3.8   No results for input(s): LIPASE, AMYLASE in the last 168 hours. No results for input(s): AMMONIA in the last 168 hours. CBC:  Recent Labs Lab 08/17/15 1637  WBC 8.3  NEUTROABS 6.4  HGB 11.7*  HCT 35.8*  MCV 86.5  PLT 244    Radiological Exams on  Admission: Dg Chest 2 View  08/17/2015  CLINICAL DATA:  Shortness of breath for 2 days. Abnormal EKG. Atrial fibrillation. Diabetes and hypertension. EXAM: CHEST  2 VIEW COMPARISON:  None. FINDINGS: The heart size and mediastinal contours are within normal limits. Both lungs are clear. No evidence of pneumothorax or pleural effusion. Old lower thoracic vertebral body compression fracture deformity noted as well as mild thoracic spine degenerative changes. IMPRESSION: No active cardiopulmonary disease. Electronically Signed   By: Earle Gell M.D.   On: 08/17/2015 17:16    EKG: Independently reviewed.    Assessment/Plan Present on Admission:  . DOE (dyspnea on exertion) . Chronic kidney disease (CKD) stage G3b/A2, moderately decreased glomerular filtration rate (GFR) between 30-44 mL/min/1.73 square meter and albuminuria creatinine ratio between 30-299 mg/g . Atrial fibrillation ([place]) . HTN (hypertension) . Hyperlipemia  PLAN:  I Lynnae Prude' think she is volume over or in CHF.  Her Cr is elevated, with clear lung exam, and BNP was only in the 700's.  She has no orthopnea and sleeps well.  She does have AKI on her CKD, and its possible that she has ischemia causing her DOE.  Will hold off on further diuresing her.  Give oxygen, and cycle her troponins.  She hadn't had an ECHO in many years, so will obtain an ECHO.  Given her age, I am not sure if she should have invasive intervention, but we will defer to cardiology.  I will continue her betablocker, statin, and will add nitropaste to her regimen.  For her afib, will give her ASA, as she had tried Xarelto in the past, and it had to be discontinued due to GI Bleed.  I asked her about her Code Status, and she would like to be a DNR.  Her daughter was tearful about her decision, but they both understood and agreed.  Will admit her to telemetry under [facility] service.  I will consult cardiology for further recommendation.  Thank you and Good Day.   Other plans as  per orders.  Code Status: DNR.    Orvan Falconer, MD. [facility] Pager [phone] 7pm to 7am.  08/17/2015, 8:48 PM

## 2015-08-17 NOTE — ED Notes (Signed)
MD at bedside. 

## 2015-08-17 NOTE — ED Notes (Signed)
SOB x 2 days. Sent by Josie Saunders. Stated changed to EKG, per family. ASA was given at PCPs office per family.

## 2015-08-17 NOTE — Progress Notes (Signed)
Subjective:    Patient ID: Julie Carpenter, female    DOB: 1926-04-01, 81 y.o.   MRN: [id number]  CC: Hypertension; Shortness of Breath; and Hyperglycemia   HPI: Julie Carpenter is a 81 y.o. female presenting for Hypertension; Shortness of Breath; and Hyperglycemia  Two days ago started having worsening SOB Today is SOB at rest Walked back tot he exam not sure if she could walk back out Not having a regular cough, but coughing more than usual No fevers BGL in the morning usually after a bowl of cereal have been 100s-200s. High of 345 this morning, low of 79. Recently increased glimeperide to 3mg  in the morning. Nothing at night.  Pt does have afib, weight is up 5 lbs over last 8 days per chart review One month ago furosemide was decreased to 20mg  from 40mg  due to low BPs at home Daughter brought BP log, AM BPs have been [id number] systolics until this morning was 150s No history of MI  Depression screen [facility] 2/9 08/17/2015 07/24/2015 12/26/2014 05/30/2014 02/23/2014  Decreased Interest 0 0 0 0 0  Down, Depressed, Hopeless 0 0 0 0 0  PHQ - 2 Score 0 0 0 0 0     Relevant past medical, surgical, family and social history reviewed and updated as indicated. Interim medical history since our last visit reviewed. Allergies and medications reviewed and updated.    ROS: Per HPI unless specifically indicated above  History  Smoking status  . Never Smoker   Smokeless tobacco  . Not on file    Past Medical History Patient Active Problem List   Diagnosis Date Noted  . GERD (gastroesophageal reflux disease) 02/23/2014  . Peripheral edema 02/23/2014  . GI bleed 04/05/2013  . Atrial fibrillation ([place]) 02/24/2013  . HTN (hypertension) 12/26/2010  . Chronic kidney disease (CKD) stage G3b/A2, moderately decreased glomerular filtration rate (GFR) between 30-44 mL/min/1.73 square meter and albuminuria creatinine ratio between 30-299 mg/g 12/26/2010  . Hyperlipemia 12/26/2010  .  Peripheral neuropathy ([place]) 12/26/2010  . Anemia 12/26/2010  . Diabetes ([place]) 12/26/2010  . Vertebral fracture 12/26/2010  . Venous insufficiency 12/26/2010  . Osteoporosis 12/26/2010  . Thyromegaly 12/26/2010    Current Outpatient Prescriptions  Medication Sig Dispense Refill  . amitriptyline (ELAVIL) 50 MG tablet TAKE ONE TABLET DAILY AT BEDTIME 90 tablet 0  . atorvastatin (LIPITOR) 40 MG tablet TAKE ONE (1) TABLET EACH DAY 90 tablet 1  . calcitRIOL (ROCALTROL) 0.25 MCG capsule Take 0.25 mcg by mouth 3 (three) times a week.  5  . carvedilol (COREG) 12.5 MG tablet Take 0.5 tablets (6.25 mg total) by mouth 2 (two) times daily with a meal. 90 tablet 0  . epoetin alfa (EPOGEN,PROCRIT) 2000 UNIT/ML injection 2,000 Units 3 (three) times a week. Patient has procrit administered at [facility] short stay every 3 weeks    . fenofibrate (TRICOR) 145 MG tablet Take 1 tablet (145 mg total) by mouth daily. 90 tablet 1  . furosemide (LASIX) 20 MG tablet Take 1 tablet (20 mg total) by mouth daily. 90 tablet 1  . glimepiride (AMARYL) 2 MG tablet Take 1.5 tablets (3 mg total) by mouth daily before breakfast. 135 tablet 1  . Calcium Carbonate (CALTRATE 600 PO) Take 600 mg by mouth daily.     No current facility-administered medications for this visit.       Objective:    BP 162/120 mmHg  Pulse 112  Temp(Src) 97.1 F (36.2 C) (Oral)  Ht  5\' 5"  (1.651 m)  Wt 169 lb (76.658 kg)  BMI 28.12 kg/m2  SpO2 94%  Wt Readings from Last 3 Encounters:  08/17/15 169 lb (76.658 kg)  08/10/15 164 lb (74.39 kg)  07/24/15 163 lb (73.936 kg)     Gen: NAD, alert, cooperative with exam, NCAT EYES: EOMI, no scleral injection or icterus ENT:   OP without erythema LYMPH: no cervical LAD CV: NRRR, normal S1/S2, no murmur, distal pulses 2+ b/l Resp: CTABL, no wheezes, comfortable WOB, able to speak in full sentences Abd: +BS, soft, NTND. no guarding or organomegaly Ext: 1+ pitting edema b/l LE, warm Neuro: Alert    EKG: atrial fib, HR 110, new T wave inversions anterior/lateral leads compared with EKG from 07/2015.     Assessment & Plan:    [name] was seen today for hypertension, shortness of breath and hyperglycemia. Pt with new T wave inversions anterior leads, SOB at rest, BGLs higher this morning to 345 which is unusual for her. Discussed with pt and daughter, she needs to be evaluated for ACS vs hypertensive urgency. Has never had heart attack before. Pt's weight is up apprx 5 lbs, some more swelling than usual in LE, HR is in 110s and usually is in 60s-70s. Symptom shave been ongoing for 2 days. Discussed sending by ambulance, daughter wants to drive her.  Diagnoses and all orders for this visit:  Elevated BP -     EKG 12-Lead  Dyspnea -     EKG 12-Lead     Follow up plan: No Follow-up on file.  Assunta Found, MD [facility] 08/17/2015, 2:44 PM

## 2015-08-18 ENCOUNTER — Observation Stay (HOSPITAL_BASED_OUTPATIENT_CLINIC_OR_DEPARTMENT_OTHER): Payer: Medicare Other

## 2015-08-18 DIAGNOSIS — R0609 Other forms of dyspnea: Secondary | ICD-10-CM

## 2015-08-18 DIAGNOSIS — R0600 Dyspnea, unspecified: Secondary | ICD-10-CM

## 2015-08-18 DIAGNOSIS — R9431 Abnormal electrocardiogram [ECG] [EKG]: Secondary | ICD-10-CM

## 2015-08-18 DIAGNOSIS — I10 Essential (primary) hypertension: Secondary | ICD-10-CM | POA: Diagnosis not present

## 2015-08-18 DIAGNOSIS — N183 Chronic kidney disease, stage 3 (moderate): Secondary | ICD-10-CM | POA: Diagnosis not present

## 2015-08-18 DIAGNOSIS — I4891 Unspecified atrial fibrillation: Secondary | ICD-10-CM

## 2015-08-18 LAB — URINALYSIS, ROUTINE W REFLEX MICROSCOPIC
Bilirubin Urine: NEGATIVE
Glucose, UA: >1000 mg/dL — AB
Ketones, ur: NEGATIVE mg/dL
Nitrite: NEGATIVE
PROTEIN: 100 mg/dL — AB
Specific Gravity, Urine: 1.015 (ref 1.005–1.030)
pH: 5.5 (ref 5.0–8.0)

## 2015-08-18 LAB — TROPONIN I
TROPONIN I: 0.06 ng/mL — AB (ref <0.031)
Troponin I: 0.07 ng/mL — ABNORMAL HIGH (ref <0.031)
Troponin I: 0.07 ng/mL — ABNORMAL HIGH (ref <0.031)

## 2015-08-18 LAB — URINE MICROSCOPIC-ADD ON

## 2015-08-18 MED ORDER — SENNOSIDES-DOCUSATE SODIUM 8.6-50 MG PO TABS: 1.0000 | ORAL_TABLET | Freq: Two times a day (BID) | ORAL | Status: DC

## 2015-08-18 MED ORDER — FOSFOMYCIN TROMETHAMINE 3 G PO PACK
3.0000 g | PACK | Freq: Once | ORAL | Status: AC
  Administered 2015-08-18: 3 g via ORAL
  Filled 2015-08-18: qty 3

## 2015-08-18 MED ORDER — POLYETHYLENE GLYCOL 3350 17 G PO PACK: 34.0000 g | PACK | Freq: Every day | ORAL | Status: DC

## 2015-08-18 MED ORDER — CARVEDILOL 12.5 MG PO TABS
12.5000 mg | ORAL_TABLET | Freq: Two times a day (BID) | ORAL | Status: DC
  Administered 2015-08-18: 12.5 mg via ORAL
  Filled 2015-08-18: qty 1

## 2015-08-18 MED ORDER — FOSFOMYCIN TROMETHAMINE 3 G PO PACK
PACK | ORAL | Status: AC
  Filled 2015-08-18: qty 3

## 2015-08-18 MED ORDER — INSULIN ASPART 100 UNIT/ML ~~LOC~~ SOLN: 0.0000 [IU] | Freq: Three times a day (TID) | SUBCUTANEOUS | Status: DC

## 2015-08-18 MED ORDER — CARVEDILOL 3.125 MG PO TABS
6.2500 mg | ORAL_TABLET | Freq: Once | ORAL | Status: AC
  Administered 2015-08-18: 3.125 mg via ORAL
  Filled 2015-08-18: qty 2

## 2015-08-18 MED ORDER — CARVEDILOL 12.5 MG PO TABS: 12.5000 mg | ORAL_TABLET | Freq: Two times a day (BID) | ORAL | Status: DC

## 2015-08-18 MED ORDER — ASPIRIN 81 MG PO TBEC: 81.0000 mg | DELAYED_RELEASE_TABLET | Freq: Every day | ORAL | Status: DC

## 2015-08-18 NOTE — Progress Notes (Signed)
Patient was given discharge instructions and had IV catheter removed on prior shift. Patient and family verbalized understanding of discharge instructions and prescriptions. One time dose of fosfomycin was given as ordered, patient tolerated well. Patient left unit with family and nursing staff via wheelchair, in stable condition upon discharge.

## 2015-08-18 NOTE — Consult Note (Signed)
CARDIOLOGY CONSULT NOTE   Patient ID: Julie Carpenter MRN: [id number] DOB/AGE: 1925/12/29 81 y.o.  Admit Date: 08/17/2015 Referring Physician: [facility] Primary Physician: Chevis Pretty, FNP Consulting Cardiologist: Kate Sable MD Primary Cardiologist: Cleatis Polka MD [facility]) Reason for Consultation: Dyspnea R/O cardiac etiology  Clinical Summary Julie Carpenter is a 81 y.o.female with known history of CKD, diabetes, hypertension, hyperlipidemia, atrial fib with CHADS VASC Score of 5 but not on anticoagulant due to hx of GI bleed, presented to ER with complaints of worsening dyspnea. She states that she has had not been seen by a cardiologist in the past but notes reveal that she has been seen by Dr. Ron Parker in 2009 in [place] office.. She has longstanding history of dizzness.   On arrival to ER, BP was 162/120, HR 112, O2 Sat 94%, afebrile. Glucose 305, Creatinine 2.12, troponin 0.12 and 0.07 ; Pro-BNP 774.0;  CXR negative for pneumonia or CHF. EKG demonstrated Atrial fib, with wandering baseline, non-specific T-wave abnormalities in the lateral leads. Echo is pending.   She is feeling better now, but still a little unsteady. She denies medical non-compliance. She lives with her daughter who gives her medications to her daily. She is a poor historian and history is obtained from medical records. Daughter is not at bedside.   Allergies  Allergen Reactions  . Penicillins Anaphylaxis    Has patient had a PCN reaction causing immediate rash, facial/tongue/throat swelling, SOB or lightheadedness with hypotension: Yes Has patient had a PCN reaction causing severe rash involving mucus membranes or skin necrosis: No Has patient had a PCN reaction that required hospitalization No Has patient had a PCN reaction occurring within the last 10 years: No If all of the above answers are "NO", then may proceed with Cephalosporin use.  Marland Kitchen Benicar [Olmesartan Medoxomil] Other (See Comments)    Lips  swelling  . Ciprofloxacin Swelling  . Hctz [Hydrochlorothiazide] Other (See Comments)    Lips swelling  . Januvia [Sitagliptin Phosphate] Other (See Comments)    Lips swelling  . Norvasc [Amlodipine Besylate] Other (See Comments)    Lips swelling  . Ramipril Other (See Comments)    Lips swelling    Medications Scheduled Medications: . amitriptyline  50 mg Oral QHS  . aspirin EC  81 mg Oral Daily  . atorvastatin  40 mg Oral q1800  . calcitRIOL  0.25 mcg Oral Once per day on Mon Wed Fri  . calcium carbonate  1,250 mg Oral Daily  . carvedilol  6.25 mg Oral BID WC  . fenofibrate  160 mg Oral Daily  . furosemide  20 mg Oral Daily  . heparin  5,000 Units Subcutaneous 3 times per day  . pantoprazole  40 mg Oral Q0600  . sodium chloride  3 mL Intravenous Q12H       Past Medical History  Diagnosis Date  . Hyperlipidemia   . Hypertension   . Diabetes mellitus without complication ([place])   . Atrial fibrillation [facility])     Past Surgical History  Procedure Laterality Date  . Abdominal hysterectomy    . Cholecystectomy    . Bladder tack    . Esophagogastroduodenoscopy N/A 04/06/2013    Procedure: ESOPHAGOGASTRODUODENOSCOPY (EGD);  Surgeon: Danie Binder, MD;  [facility];  Service: Endoscopy;  Laterality: N/A;  8:45  . Givens capsule study  04/06/2013    Procedure: GIVENS CAPSULE STUDY;  Surgeon: Danie Binder, MD;  Location[facility];  Service: Endoscopy;;  . Colonoscopy  N/A 05/03/2013    [id number] MOST LIKELY DUE TO ONE AVM in the ascending colon/Moderate diverticulosis hroughout the entire examined colon/Two POLYPS REMOVED/Small internal hemorrhoids    Family History  Problem Relation Age of Onset  . Colon cancer Neg Hx     Social History Julie Carpenter reports that she has never smoked. She does not have any smokeless tobacco history on file. Julie Carpenter reports that she does not drink alcohol.  Review of Systems Complete review of systems are found to be  negative unless outlined in H&P above.  Physical Examination Blood pressure 158/98, pulse 95, temperature 98 F (36.7 C), temperature source Oral, resp. rate 20, height 5\' 5"  (1.651 m), weight 169 lb (76.658 kg), SpO2 92 %. No intake or output data in the 24 hours ending 08/18/15 0800  Telemetry: Atrial fib, rates in the 80's.   GEN: No acute distress  HEENT: Conjunctiva and lids normal, oropharynx clear with moist mucosa. Neck: Supple, no elevated JVP or carotid bruits, no thyromegaly. Lungs: Clear to auscultation, nonlabored breathing at rest. Cardiac: Irregular rate and rhythm, slightly tachycardiac, no S3 or significant systolic murmur, no pericardial rub. Abdomen: Soft, nontender, no hepatomegaly, bowel sounds present, no guarding or rebound. Extremities: No pitting edema, distal pulses 2+. Skin: Warm and dry. Musculoskeletal: No kyphosis. Neuropsychiatric: Alert and oriented x3, affect grossly appropriate. Hard of hearing.   Prior Cardiac Testing/Procedures (Per notes from Dr. Ron Parker in [place] office) Echocardiogram 11/2007 This study shows that she has a normal ejection fraction. The EF is in the range of 55-60%. There was mild left ventricular hypertrophy. There was mild mitral regurgitation.  Event Recorder 11/2007 Sinus rhythm for the entire prolonged period. She did not have any atrial fibrillation. There were 3 beats of supraventricular tachycardia. There were no prolonged pauses. There was no ventricular tachycardia Lab Results  Basic Metabolic Panel:  Recent Labs Lab 08/17/15 1752  NA 136  K 4.0  CL 103  CO2 21*  GLUCOSE 305*  BUN 30*  CREATININE 2.12*  CALCIUM 9.3    Liver Function Tests:  Recent Labs Lab 08/17/15 1752  AST 33  ALT 21  ALKPHOS 109  BILITOT 0.7  PROT 7.4  ALBUMIN 3.8    CBC:  Recent Labs Lab 08/17/15 1637  WBC 8.3  NEUTROABS 6.4  HGB 11.7*  HCT 35.8*  MCV 86.5  PLT 244    Cardiac Enzymes:  Recent Labs Lab  08/17/15 1752 08/18/15 0427  TROPONINI 0.12* 0.07*    Radiology: Dg Chest 2 View  08/17/2015  CLINICAL DATA:  Shortness of breath for 2 days. Abnormal EKG. Atrial fibrillation. Diabetes and hypertension. EXAM: CHEST  2 VIEW COMPARISON:  None. FINDINGS: The heart size and mediastinal contours are within normal limits. Both lungs are clear. No evidence of pneumothorax or pleural effusion. Old lower thoracic vertebral body compression fracture deformity noted as well as mild thoracic spine degenerative changes. IMPRESSION: No active cardiopulmonary disease. Electronically Signed   By: Earle Gell M.D.   On: 08/17/2015 17:16     ECG: Atrial fib with rate of 118 bpm.    Impression and Recommendations  1. Atrial fib: Heart rate is not well controlled. She has been on Coreg 6.25 mg BID. She is not a candidate for anticoagulation due to history of GI bleed but remains on ASA. Will increase to 9.775 BID for better HR control and BP control. Echocardiogram pending.   2. Hypertension: Review of home medications does not have her on ACE.  With history of CKD, she may benefit from hydralazine or amlodipine. Can also consider changing BB to diltiazem Will await echo before changes to evaluate LV fx first  3. Positive Troponin: Likely from demand ischemia with elevated HR and BP. Due to age and other co-morbidities, also notes stating that her daughter does not want any invasive testing, will manage medically and not do and ischemic work-up. It is trending downward. She is a DNR  4. Diabetes: Glucose elevated on arrival. Check Hgb A1C    Signed: Phill Myron. Lawrence NP AACC  08/18/2015, 8:00 AM Co-Sign MD   The patient was seen and examined, and I agree with the assessment and plan as documented above, with modifications as noted below. Pt admitted with exertional dyspnea x one month, primarily occurring with exertion. Denies chest pain/tightness altogether.  Has atrial fibrillation with elevated HR  and elevated BP, both of which together can lead to exertional dyspnea. I agree that she is not in heart failure. ECG shows nonspecific ST-T abnormalities. Hence, ischemic heart disease may also be playing a role. Echocardiogram has been ordered and is pending.  Recommendations: To control HR and BP, I will increase Coreg to 12.5 mg bid which may help with exertional dyspnea. Given her poor renal function, she is not a good candidate for coronary angiography so will not pursue an ischemic evaluation with stress testing but manage her symptoms medically. Troponin elevation related to demand ischemia. Continue ASA (not an anticoagulation candidate as noted above) and statin.  Kate Sable, MD, [facility]  08/18/2015 9:49 AM

## 2015-08-18 NOTE — Care Management Important Message (Deleted)
Important Message  Patient Details  Name: Julie Carpenter MRN: [id number] Date of Birth: [birth year]   Medicare Important Message Given:  Yes    Joylene Draft, RN 08/18/2015, 3:07 PM

## 2015-08-18 NOTE — Care Management Note (Signed)
Case Management Note  Patient Details  Name: Julie Carpenter MRN: [id number] Date of Birth: 11-09-1925  Subjective/Objective:                  Pt admitted from home with DOE. Pt lives alone and will return home at discharge. Pt is independent with ADL's. Pt stated she does her own cooking, caring for self, and still driving.  Action/Plan: No CM needs noted. Anticipate discharge within 24 hours.  Expected Discharge Date:                  Expected Discharge Plan:  Home/Self Care  In-House Referral:  NA  Discharge planning Services  CM Consult  Post Acute Care Choice:  NA Choice offered to:  NA  DME Arranged:    DME Agency:     HH Arranged:    HH Agency:     Status of Service:  Completed, signed off  Medicare Important Message Given:    Date Medicare IM Given:    Medicare IM give by:    Date Additional Medicare IM Given:    Additional Medicare Important Message give by:     If discussed at [place] of Stay Meetings, dates discussed:    Additional Comments:  Joylene Draft, RN 08/18/2015, 9:01 AM

## 2015-08-18 NOTE — Discharge Summary (Addendum)
Julie Carpenter, is a 81 y.o. female  DOB Jan 28, 1926  MRN [id number].  Admission date:  08/17/2015  Admitting Physician  Orvan Falconer, MD  Discharge Date:  08/18/2015   Primary MD  Chevis Pretty, FNP  Recommendations for primary care physician for things to follow:  - Please check CBC, BMP during next visit. - Patient carvedilol dose was increased, please reassess your next visit to see if it needs to be further titrated to heart rate.    Admission Diagnosis  Elevated brain natriuretic peptide (BNP) level [R79.9] AKI (acute kidney injury) ([place]) [N17.9]   Discharge Diagnosis  Elevated brain natriuretic peptide (BNP) level [R79.9] AKI (acute kidney injury) ([place]) [N17.9]    Principal Problem:   DOE (dyspnea on exertion) Active Problems:   HTN (hypertension)   Chronic kidney disease (CKD) stage G3b/A2, moderately decreased glomerular filtration rate (GFR) between 30-44 mL/min/1.73 square meter and albuminuria creatinine ratio between 30-299 mg/g   Hyperlipemia   Diabetes (HCC)   Atrial fibrillation [facility])      Past Medical History  Diagnosis Date  . Hyperlipidemia   . Hypertension   . Diabetes mellitus without complication ([place])   . Atrial fibrillation [facility])     Past Surgical History  Procedure Laterality Date  . Abdominal hysterectomy    . Cholecystectomy    . Bladder tack    . Esophagogastroduodenoscopy N/A 04/06/2013    Procedure: ESOPHAGOGASTRODUODENOSCOPY (EGD);  Surgeon: Danie Binder, MD;  Location: [facility] ENDO SU[facility];  Service: Endoscopy;  Laterality: N/A;  8:45  . Givens capsule study  04/06/2013    Procedure: GIVENS CAPSULE STUDY;  Surgeon: Danie Binder, MD;  Location: [facility] ENDO SUITE;  Service: Endoscopy;;  . Colonoscopy N/A 05/03/2013    [id number] MOST LIKELY DUE TO ONE AVM in the ascending colon/Moderate diverticulosis hroughout the entire examined colon/Two POLYPS REMOVED/Small  internal hemorrhoids       History of present illness and  Hospital Course:     Kindly see H&P for history of present illness and admission details, please review complete Labs, Consult reports and Test reports for all details in brief  HPI  from the history and physical done on the day of admission Julie Carpenter is an 81 y.o. female with hx of CKD with baseline Cr about 1.5, hx of DM, HTN, HLD, afib with lower GI bleed unable to tolerate anticoagulation ( Colonsocopy with AVM and tics--Dr Fields-2014), sent in from her PCP for increase DOE this past week, along with EKG showing Afib and T wave changes. She was sent to the ER for ACS r/out. She denied chest pain, fever, chills, nausea or vomiting. Her Work up in the ER showed afib with no ST T segment depressions, Cr of 2.12, clear CXR, and BNP of 774. She was given IV 40mg  Lasix by EDP, and hospitalist was asked to admit her for [facility]   Hospital Course   Dyspnea - Is most likely related to uncontrolled heart rate secondary to atrial fibrillation  , she does  not appear to be in heart failure, no evidence of volume overload on chest x-ray, no leg edema, no JVD. - Patient was ambulated on the hallway, no desaturation.  Atrial fibrillation - Heartrate poorly controlled, cardiology consult greatly appreciated, Coreg dose was increased from 6.25 mg twice a day to 12.5 mg twice a day. -Not candidate for anticoagulation due to her history of GI bleed, continue with aspirin  Elevated troponin - This is related to demand ischemia from elevated heart rate and uncontrolled blood pressure, troponins trending down 0.12> 0.07> 0.07, and denies any chest pain, 2-D echo done with no regional wall motion abnormality, EF 65%.  UTI - treated with fosfomycin  Diabetes - Continue with Amaryl  Acute on CKD stage III - Follow nephrology as an outpatient - Continue with Lasix on dose  Discharge Condition:  Stable   Follow UP  Follow-up  Information    Follow up with Chevis Pretty, FNP. Schedule an appointment as soon as possible for a visit in 1 week.   Specialty:  Family Medicine   Why:  Posthospitalization follow-up   Contact information:   [address] [place] [address] [phone]         Discharge Instructions  and  Discharge Medications    Discharge Instructions    Discharge instructions    Complete by:  As directed   Follow with Primary MD Chevis Pretty, FNP in 7 days   Get CBC, CMP checked  by Primary MD next visit.    Activity: As tolerated with Full fall precautions use walker/cane & assistance as needed   Disposition Home    Diet: Heart Healthy , carbohydrate modified , with feeding assistance and aspiration precautions.  For Heart failure patients - Check your Weight same time everyday, if you gain over 2 pounds, or you develop in leg swelling, experience more shortness of breath or chest pain, call your Primary MD immediately. Follow Cardiac Low Salt Diet and 1.5 lit/day fluid restriction.   On your next visit with your primary care physician please Get Medicines reviewed and adjusted.   Please request your Prim.MD to go over all Hospital Tests and Procedure/Radiological results at the follow up, please get all Hospital records sent to your Prim MD by signing hospital release before you go home.   If you experience worsening of your admission symptoms, develop shortness of breath, life threatening emergency, suicidal or homicidal thoughts you must seek medical attention immediately by calling 911 or calling your MD immediately  if symptoms less severe.  You Must read complete instructions/literature along with all the possible adverse reactions/side effects for all the Medicines you take and that have been prescribed to you. Take any new Medicines after you have completely understood and accpet all the possible adverse reactions/side effects.   Do not drive, operating  heavy machinery, perform activities at heights, swimming or participation in water activities or provide baby sitting services if your were admitted for syncope or siezures until you have seen by Primary MD or a Neurologist and advised to do so again.  Do not drive when taking Pain medications.    Do not take more than prescribed Pain, Sleep and Anxiety Medications  Special Instructions: If you have smoked or chewed Tobacco  in the last 2 yrs please stop smoking, stop any regular Alcohol  and or any Recreational drug use.  Wear Seat belts while driving.   Please note  You were cared for by a hospitalist during your hospital  stay. If you have any questions about your discharge medications or the care you received while you were in the hospital after you are discharged, you can call the unit and asked to speak with the hospitalist on call if the hospitalist that took care of you is not available. Once you are discharged, your primary care physician will handle any further medical issues. Please note that NO REFILLS for any discharge medications will be authorized once you are discharged, as it is imperative that you return to your primary care physician (or establish a relationship with a primary care physician if you do not have one) for your aftercare needs so that they can reassess your need for medications and monitor your lab values.     Increase activity slowly    Complete by:  As directed             Medication List    TAKE these medications        amitriptyline 50 MG tablet  Commonly known as:  ELAVIL  TAKE ONE TABLET DAILY AT BEDTIME     aspirin 81 MG EC tablet  Take 1 tablet (81 mg total) by mouth daily.     atorvastatin 40 MG tablet  Commonly known as:  LIPITOR  TAKE ONE (1) TABLET EACH DAY     calcitRIOL 0.25 MCG capsule  Commonly known as:  ROCALTROL  Take 0.25 mcg by mouth 3 (three) times a week. Mon.,Wed.,Fri.     CALTRATE 600 PO  Take 600 mg by mouth daily.       carvedilol 12.5 MG tablet  Commonly known as:  COREG  Take 1 tablet (12.5 mg total) by mouth 2 (two) times daily with a meal.     epoetin alfa 2000 UNIT/ML injection  Commonly known as:  EPOGEN,PROCRIT  2,000 Units 3 (three) times a week. Patient has procrit administered at [facility] short stay every 3 weeks     fenofibrate 145 MG tablet  Commonly known as:  TRICOR  Take 1 tablet (145 mg total) by mouth daily.     furosemide 20 MG tablet  Commonly known as:  LASIX  Take 1 tablet (20 mg total) by mouth daily.     glimepiride 2 MG tablet  Commonly known as:  AMARYL  Take 1.5 tablets (3 mg total) by mouth daily before breakfast.          Diet and Activity recommendation: See Discharge Instructions above   Consults obtained -  Cardiology   Major procedures and Radiology Reports - PLEASE review detailed and final reports for all details, in brief -   2-D echo   Dg Chest 2 View  08/17/2015  CLINICAL DATA:  Shortness of breath for 2 days. Abnormal EKG. Atrial fibrillation. Diabetes and hypertension. EXAM: CHEST  2 VIEW COMPARISON:  None. FINDINGS: The heart size and mediastinal contours are within normal limits. Both lungs are clear. No evidence of pneumothorax or pleural effusion. Old lower thoracic vertebral body compression fracture deformity noted as well as mild thoracic spine degenerative changes. IMPRESSION: No active cardiopulmonary disease. Electronically Signed   By: Earle Gell M.D.   On: 08/17/2015 17:16    Micro Results     No results found for this or any previous visit (from the past 240 hour(s)).     Today   Subjective:   Tamee Leisher today has no headache,no chest abdominal pain,no new weakness tingling or numbness, feels much better  today.  Objective:  Blood pressure 158/112, pulse 110, temperature 98.5 F (36.9 C), temperature source Oral, resp. rate 18, height 5\' 5"  (1.651 m), weight 76.658 kg (169 lb), SpO2 93 %.  No intake or output  data in the 24 hours ending 08/18/15 1648  Exam Awake Alert, Oriented x 3, No new F.N deficits, Normal affect [place].AT,PERRAL Supple Neck,No JVD, No cervical lymphadenopathy appriciated.  Symmetrical Chest wall movement, Good air movement bilaterally, CTAB irregular,No Gallops,Rubs or new Murmurs, No Parasternal Heave +ve B.Sounds, Abd Soft, Non tender, No organomegaly appriciated, No rebound -guarding or rigidity. No Cyanosis, Clubbing or edema, No new Rash or bruise  Data Review   CBC w Diff:  Lab Results  Component Value Date   WBC 8.3 08/17/2015   WBC 6.1 12/26/2014   WBC 7.1 04/12/2013   HGB 11.7* 08/17/2015   HGB 10.4* 04/19/2013   HCT 35.8* 08/17/2015   HCT 29.7* 04/12/2013   PLT 244 08/17/2015   LYMPHOPCT 12 08/17/2015   MONOPCT 9 08/17/2015   EOSPCT 1 08/17/2015   BASOPCT 0 08/17/2015    CMP:  Lab Results  Component Value Date   NA 136 08/17/2015   NA 136 07/24/2015   K 4.0 08/17/2015   CL 103 08/17/2015   CO2 21* 08/17/2015   BUN 30* 08/17/2015   BUN 32* 07/24/2015   CREATININE 2.12* 08/17/2015   CREATININE 2.10* 02/24/2013   PROT 7.4 08/17/2015   PROT 6.7 07/24/2015   ALBUMIN 3.8 08/17/2015   ALBUMIN 3.9 07/24/2015   BILITOT 0.7 08/17/2015   BILITOT 0.6 07/24/2015   ALKPHOS 109 08/17/2015   AST 33 08/17/2015   ALT 21 08/17/2015  .   Total Time in preparing paper work, data evaluation and todays exam - 35 minutes  Gilmore List M.D on 08/18/2015 at 4:48 PM  [facility]   Office  [phone]

## 2015-08-18 NOTE — Care Management Obs Status (Signed)
[place] NOTIFICATION   Patient Details  Name: Julie Carpenter MRN: [id number] Date of Birth: Jan 19, 1926   Medicare Observation Status Notification Given:  Yes    Joylene Draft, RN 08/18/2015, 9:00 AM

## 2015-08-18 NOTE — Discharge Instructions (Signed)
Follow with Primary MD Julie Pretty, FNP in 7 days   Get CBC, CMP checked  by Primary MD next visit.    Activity: As tolerated with Full fall precautions use walker/cane & assistance as needed   Disposition Home    Diet: Heart Healthy , carbohydrate modified , with feeding assistance and aspiration precautions.  For Heart failure patients - Check your Weight same time everyday, if you gain over 2 pounds, or you develop in leg swelling, experience more shortness of breath or chest pain, call your Primary MD immediately. Follow Cardiac Low Salt Diet and 1.5 lit/day fluid restriction.   On your next visit with your primary care physician please Get Medicines reviewed and adjusted.   Please request your Prim.MD to go over all Hospital Tests and Procedure/Radiological results at the follow up, please get all Hospital records sent to your Prim MD by signing hospital release before you go home.   If you experience worsening of your admission symptoms, develop shortness of breath, life threatening emergency, suicidal or homicidal thoughts you must seek medical attention immediately by calling 911 or calling your MD immediately  if symptoms less severe.  You Must read complete instructions/literature along with all the possible adverse reactions/side effects for all the Medicines you take and that have been prescribed to you. Take any new Medicines after you have completely understood and accpet all the possible adverse reactions/side effects.   Do not drive, operating heavy machinery, perform activities at heights, swimming or participation in water activities or provide baby sitting services if your were admitted for syncope or siezures until you have seen by Primary MD or a Neurologist and advised to do so again.  Do not drive when taking Pain medications.    Do not take more than prescribed Pain, Sleep and Anxiety Medications  Special Instructions: If you have smoked or chewed  Tobacco  in the last 2 yrs please stop smoking, stop any regular Alcohol  and or any Recreational drug use.  Wear Seat belts while driving.   Please note  You were cared for by a hospitalist during your hospital stay. If you have any questions about your discharge medications or the care you received while you were in the hospital after you are discharged, you can call the unit and asked to speak with the hospitalist on call if the hospitalist that took care of you is not available. Once you are discharged, your primary care physician will handle any further medical issues. Please note that NO REFILLS for any discharge medications will be authorized once you are discharged, as it is imperative that you return to your primary care physician (or establish a relationship with a primary care physician if you do not have one) for your aftercare needs so that they can reassess your need for medications and monitor your lab values.

## 2015-08-18 NOTE — Progress Notes (Signed)
Inpatient Diabetes Program Recommendations  AACE/ADA: New Consensus Statement on Inpatient Glycemic Control (2015)  Target Ranges:  Prepandial:   less than 140 mg/dL      Peak postprandial:   less than 180 mg/dL (1-2 hours)      Critically ill patients:  140 - 180 mg/dL  Results for Julie Carpenter, Julie Carpenter (MRN [id number]) as of 08/18/2015 07:50  Ref. Range 08/17/2015 17:52  Glucose Latest Ref Range: 65-99 mg/dL 305 (H)   Review of Glycemic Control  Diabetes history: DM2 Outpatient Diabetes medications: Amaryl 3 mg QAM Current orders for Inpatient glycemic control: None  Inpatient Diabetes Program Recommendations: Correction (SSI): While inpatient, please consider ordering CBGs with Novolog correction scale ACHS.  Thanks, Barnie Alderman, RN, MSN, CDE Diabetes Coordinator Inpatient Diabetes Program [phone] (Team Pager from [place] to [place]) [phone] ([facility] office) [phone] [facility] office) [phone] [facility] office)

## 2015-08-22 ENCOUNTER — Inpatient Hospital Stay (HOSPITAL_COMMUNITY)
Admission: EM | Admit: 2015-08-22 | Discharge: 2015-08-29 | DRG: 175 | Disposition: A | Discharge status: Home Health | Payer: Medicare Other | Attending: Internal Medicine | Admitting: Internal Medicine

## 2015-08-22 ENCOUNTER — Encounter (HOSPITAL_COMMUNITY): Payer: Self-pay | Admitting: Emergency Medicine

## 2015-08-22 ENCOUNTER — Telehealth: Payer: Self-pay | Admitting: *Deleted

## 2015-08-22 ENCOUNTER — Emergency Department (HOSPITAL_COMMUNITY): Payer: Medicare Other

## 2015-08-22 DIAGNOSIS — J9601 Acute respiratory failure with hypoxia: Secondary | ICD-10-CM | POA: Diagnosis present

## 2015-08-22 DIAGNOSIS — E1165 Type 2 diabetes mellitus with hyperglycemia: Secondary | ICD-10-CM | POA: Diagnosis present

## 2015-08-22 DIAGNOSIS — I2699 Other pulmonary embolism without acute cor pulmonale: Secondary | ICD-10-CM | POA: Diagnosis present

## 2015-08-22 DIAGNOSIS — Z79899 Other long term (current) drug therapy: Secondary | ICD-10-CM

## 2015-08-22 DIAGNOSIS — N1832 Chronic kidney disease, stage 3b: Secondary | ICD-10-CM | POA: Diagnosis present

## 2015-08-22 DIAGNOSIS — I82512 Chronic embolism and thrombosis of left femoral vein: Secondary | ICD-10-CM | POA: Diagnosis present

## 2015-08-22 DIAGNOSIS — R7989 Other specified abnormal findings of blood chemistry: Secondary | ICD-10-CM

## 2015-08-22 DIAGNOSIS — I10 Essential (primary) hypertension: Secondary | ICD-10-CM | POA: Diagnosis present

## 2015-08-22 DIAGNOSIS — I959 Hypotension, unspecified: Secondary | ICD-10-CM | POA: Diagnosis present

## 2015-08-22 DIAGNOSIS — N179 Acute kidney failure, unspecified: Secondary | ICD-10-CM | POA: Diagnosis not present

## 2015-08-22 DIAGNOSIS — N183 Chronic kidney disease, stage 3 (moderate): Secondary | ICD-10-CM | POA: Diagnosis not present

## 2015-08-22 DIAGNOSIS — N390 Urinary tract infection, site not specified: Secondary | ICD-10-CM | POA: Diagnosis present

## 2015-08-22 DIAGNOSIS — N184 Chronic kidney disease, stage 4 (severe): Secondary | ICD-10-CM | POA: Diagnosis present

## 2015-08-22 DIAGNOSIS — I82531 Chronic embolism and thrombosis of right popliteal vein: Secondary | ICD-10-CM | POA: Diagnosis present

## 2015-08-22 DIAGNOSIS — I82511 Chronic embolism and thrombosis of right femoral vein: Secondary | ICD-10-CM | POA: Diagnosis present

## 2015-08-22 DIAGNOSIS — R0602 Shortness of breath: Secondary | ICD-10-CM | POA: Diagnosis present

## 2015-08-22 DIAGNOSIS — E118 Type 2 diabetes mellitus with unspecified complications: Secondary | ICD-10-CM | POA: Diagnosis not present

## 2015-08-22 DIAGNOSIS — I129 Hypertensive chronic kidney disease with stage 1 through stage 4 chronic kidney disease, or unspecified chronic kidney disease: Secondary | ICD-10-CM | POA: Diagnosis present

## 2015-08-22 DIAGNOSIS — D649 Anemia, unspecified: Secondary | ICD-10-CM | POA: Diagnosis present

## 2015-08-22 DIAGNOSIS — R0600 Dyspnea, unspecified: Secondary | ICD-10-CM | POA: Diagnosis present

## 2015-08-22 DIAGNOSIS — Z66 Do not resuscitate: Secondary | ICD-10-CM | POA: Diagnosis present

## 2015-08-22 DIAGNOSIS — I4891 Unspecified atrial fibrillation: Secondary | ICD-10-CM | POA: Diagnosis not present

## 2015-08-22 DIAGNOSIS — Z9181 History of falling: Secondary | ICD-10-CM

## 2015-08-22 DIAGNOSIS — Z7982 Long term (current) use of aspirin: Secondary | ICD-10-CM

## 2015-08-22 DIAGNOSIS — Z7984 Long term (current) use of oral hypoglycemic drugs: Secondary | ICD-10-CM

## 2015-08-22 DIAGNOSIS — I825Z1 Chronic embolism and thrombosis of unspecified deep veins of right distal lower extremity: Secondary | ICD-10-CM | POA: Diagnosis present

## 2015-08-22 DIAGNOSIS — R5381 Other malaise: Secondary | ICD-10-CM | POA: Diagnosis present

## 2015-08-22 DIAGNOSIS — E1122 Type 2 diabetes mellitus with diabetic chronic kidney disease: Secondary | ICD-10-CM | POA: Diagnosis present

## 2015-08-22 DIAGNOSIS — E785 Hyperlipidemia, unspecified: Secondary | ICD-10-CM | POA: Diagnosis present

## 2015-08-22 DIAGNOSIS — E119 Type 2 diabetes mellitus without complications: Secondary | ICD-10-CM

## 2015-08-22 LAB — BRAIN NATRIURETIC PEPTIDE: B NATRIURETIC PEPTIDE 5: 1382 pg/mL — AB (ref 0.0–100.0)

## 2015-08-22 LAB — CBC
HEMATOCRIT: 36.2 % (ref 36.0–46.0)
Hemoglobin: 11.5 g/dL — ABNORMAL LOW (ref 12.0–15.0)
MCH: 27.1 pg (ref 26.0–34.0)
MCHC: 31.8 g/dL (ref 30.0–36.0)
MCV: 85.4 fL (ref 78.0–100.0)
PLATELETS: 190 10*3/uL (ref 150–400)
RBC: 4.24 MIL/uL (ref 3.87–5.11)
RDW: 15.8 % — AB (ref 11.5–15.5)
WBC: 7.8 10*3/uL (ref 4.0–10.5)

## 2015-08-22 LAB — GLUCOSE, CAPILLARY
Glucose-Capillary: 476 mg/dL — ABNORMAL HIGH (ref 65–99)
Glucose-Capillary: 361 mg/dL — ABNORMAL HIGH (ref 65–99)

## 2015-08-22 LAB — BASIC METABOLIC PANEL
ANION GAP: 10 (ref 5–15)
BUN: 42 mg/dL — AB (ref 6–20)
CHLORIDE: 103 mmol/L (ref 101–111)
CO2: 19 mmol/L — AB (ref 22–32)
Calcium: 9.4 mg/dL (ref 8.9–10.3)
Creatinine, Ser: 2.58 mg/dL — ABNORMAL HIGH (ref 0.44–1.00)
GFR calc Af Amer: 18 mL/min — ABNORMAL LOW (ref >60)
GFR, EST NON AFRICAN AMERICAN: 16 mL/min — AB (ref >60)
GLUCOSE: 533 mg/dL — AB (ref 65–99)
POTASSIUM: 4.7 mmol/L (ref 3.5–5.1)
Sodium: 132 mmol/L — ABNORMAL LOW (ref 135–145)

## 2015-08-22 LAB — D-DIMER, QUANTITATIVE (NOT AT ARMC): D DIMER QUANT: 5.78 ug{FEU}/mL — AB (ref 0.00–0.50)

## 2015-08-22 LAB — TROPONIN I: TROPONIN I: 0.08 ng/mL — AB (ref <0.031)

## 2015-08-22 LAB — I-STAT TROPONIN, ED: Troponin i, poc: 0.07 ng/mL (ref 0.00–0.08)

## 2015-08-22 MED ORDER — ENOXAPARIN SODIUM 30 MG/0.3ML ~~LOC~~ SOLN
30.0000 mg | SUBCUTANEOUS | Status: DC
  Administered 2015-08-22: 30 mg via SUBCUTANEOUS
  Filled 2015-08-22: qty 0.3

## 2015-08-22 MED ORDER — ATORVASTATIN CALCIUM 40 MG PO TABS
40.0000 mg | ORAL_TABLET | Freq: Every morning | ORAL | Status: DC
  Administered 2015-08-23 – 2015-08-29 (×7): 40 mg via ORAL
  Filled 2015-08-22 (×7): qty 1

## 2015-08-22 MED ORDER — ONDANSETRON HCL 4 MG PO TABS: 4.0000 mg | ORAL_TABLET | Freq: Four times a day (QID) | ORAL | Status: DC | PRN

## 2015-08-22 MED ORDER — MORPHINE SULFATE (PF) 2 MG/ML IV SOLN: 1.0000 mg | INTRAVENOUS | Status: DC | PRN

## 2015-08-22 MED ORDER — CARVEDILOL 12.5 MG PO TABS
12.5000 mg | ORAL_TABLET | Freq: Two times a day (BID) | ORAL | Status: DC
  Administered 2015-08-22 – 2015-08-29 (×14): 12.5 mg via ORAL
  Filled 2015-08-22 (×14): qty 1

## 2015-08-22 MED ORDER — INSULIN ASPART 100 UNIT/ML ~~LOC~~ SOLN
0.0000 [IU] | Freq: Three times a day (TID) | SUBCUTANEOUS | Status: DC
  Administered 2015-08-23 (×3): 2 [IU] via SUBCUTANEOUS
  Administered 2015-08-24: 3 [IU] via SUBCUTANEOUS
  Administered 2015-08-24: 2 [IU] via SUBCUTANEOUS
  Administered 2015-08-24 (×2): 3 [IU] via SUBCUTANEOUS
  Administered 2015-08-25: 1 [IU] via SUBCUTANEOUS
  Administered 2015-08-25: 3 [IU] via SUBCUTANEOUS

## 2015-08-22 MED ORDER — CALCITRIOL 0.25 MCG PO CAPS
0.2500 ug | ORAL_CAPSULE | ORAL | Status: DC
  Administered 2015-08-23 – 2015-08-28 (×3): 0.25 ug via ORAL
  Filled 2015-08-22 (×3): qty 1

## 2015-08-22 MED ORDER — AMITRIPTYLINE HCL 50 MG PO TABS
50.0000 mg | ORAL_TABLET | Freq: Every day | ORAL | Status: DC
  Administered 2015-08-22 – 2015-08-28 (×7): 50 mg via ORAL
  Filled 2015-08-22 (×8): qty 1

## 2015-08-22 MED ORDER — ONDANSETRON HCL 4 MG/2ML IJ SOLN: 4.0000 mg | Freq: Four times a day (QID) | INTRAMUSCULAR | Status: DC | PRN

## 2015-08-22 MED ORDER — INSULIN ASPART 100 UNIT/ML ~~LOC~~ SOLN
0.0000 [IU] | Freq: Three times a day (TID) | SUBCUTANEOUS | Status: DC
  Administered 2015-08-22: 9 [IU] via SUBCUTANEOUS

## 2015-08-22 MED ORDER — ASPIRIN EC 325 MG PO TBEC: 325.0000 mg | DELAYED_RELEASE_TABLET | Freq: Once | ORAL | Status: DC

## 2015-08-22 MED ORDER — ACETAMINOPHEN 650 MG RE SUPP: 650.0000 mg | Freq: Four times a day (QID) | RECTAL | Status: DC | PRN

## 2015-08-22 MED ORDER — FUROSEMIDE 10 MG/ML IJ SOLN
20.0000 mg | Freq: Once | INTRAMUSCULAR | Status: AC
  Administered 2015-08-22: 20 mg via INTRAVENOUS
  Filled 2015-08-22: qty 2

## 2015-08-22 MED ORDER — ACETAMINOPHEN 325 MG PO TABS
650.0000 mg | ORAL_TABLET | Freq: Four times a day (QID) | ORAL | Status: DC | PRN
Start: 2015-08-22 — End: 2015-08-29

## 2015-08-22 MED ORDER — HYDROCODONE-ACETAMINOPHEN 5-325 MG PO TABS: 1.0000 | ORAL_TABLET | ORAL | Status: DC | PRN

## 2015-08-22 MED ORDER — INSULIN ASPART 100 UNIT/ML ~~LOC~~ SOLN: 0.0000 [IU] | Freq: Three times a day (TID) | SUBCUTANEOUS | Status: DC

## 2015-08-22 MED ORDER — IPRATROPIUM-ALBUTEROL 0.5-2.5 (3) MG/3ML IN SOLN
3.0000 mL | RESPIRATORY_TRACT | Status: DC | PRN
  Administered 2015-08-25: 3 mL via RESPIRATORY_TRACT

## 2015-08-22 MED ORDER — ASPIRIN 300 MG RE SUPP
300.0000 mg | Freq: Once | RECTAL | Status: AC
  Administered 2015-08-22: 300 mg via RECTAL
  Filled 2015-08-22: qty 1

## 2015-08-22 MED ORDER — FENOFIBRATE 54 MG PO TABS
54.0000 mg | ORAL_TABLET | Freq: Every day | ORAL | Status: DC
  Administered 2015-08-23 – 2015-08-29 (×7): 54 mg via ORAL
  Filled 2015-08-22 (×7): qty 1

## 2015-08-22 MED ORDER — INSULIN ASPART 100 UNIT/ML ~~LOC~~ SOLN
8.0000 [IU] | Freq: Once | SUBCUTANEOUS | Status: AC
  Administered 2015-08-23: 8 [IU] via SUBCUTANEOUS

## 2015-08-22 NOTE — ED Provider Notes (Signed)
CSN: [id number]     Arrival date & time 08/22/15  1226 History   First MD Initiated Contact with Patient 08/22/15 1226     Chief Complaint  Patient presents with  . Shortness of Breath     (Consider location/radiation/quality/duration/timing/severity/associated sxs/prior Treatment) HPI   Julie Carpenter is a 81 y.o. female with PMH significant for HLD, HTN, DM, atrial fibrillation who presents with 1 day history of gradual onset, constant, worsening SOB and chest tightness.  No aggravating or alleviating factors.  Denies PND, orthopnea, cough, CP, fever, chills, N/V, abdominal pain, or urinary symptoms.  Anti-coagulation: ASA 81 mg.  Patient recently admitted for SOB on 08/17/15; however, she states this feels different.   Past Medical History  Diagnosis Date  . Hyperlipidemia   . Hypertension   . Diabetes mellitus without complication ([place])   . Atrial fibrillation [facility])    Past Surgical History  Procedure Laterality Date  . Abdominal hysterectomy    . Cholecystectomy    . Bladder tack    . Esophagogastroduodenoscopy N/A 04/06/2013    Procedure: ESOPHAGOGASTRODUODENOSCOPY (EGD);  Surgeon: Danie Binder, MD;  Location[facility];  Service: Endoscopy;  Laterality: N/A;  8:45  . Givens capsule study  04/06/2013    Procedure: GIVENS CAPSULE STUDY;  Surgeon: Danie Binder, MD;  Location: [facility]O SU[facility];  Service: Endoscopy;;  . Colonoscopy N/A 05/03/2013    [id number] MOST LIKELY DUE TO ONE AVM in the ascending colon/Moderate diverticulosis hroughout the entire examined colon/Two POLYPS REMOVED/Small internal hemorrhoids   Family History  Problem Relation Age of Onset  . Colon cancer Neg Hx    Social History  Substance Use Topics  . Smoking status: Never Smoker   . Smokeless tobacco: None  . Alcohol Use: No   OB History    No data available     Review of Systems  All other systems negative unless otherwise stated in HPI   Allergies  Penicillins; Benicar;  Ciprofloxacin; Hctz; Januvia; Norvasc; and Ramipril  Home Medications   Prior to Admission medications   Medication Sig Start Date End Date Taking? Authorizing Provider  amitriptyline (ELAVIL) 50 MG tablet TAKE ONE TABLET DAILY AT BEDTIME 07/12/15   Mary-Margaret Hassell Done, FNP  aspirin EC 81 MG EC tablet Take 1 tablet (81 mg total) by mouth daily. 08/18/15   Silver Huguenin Elgergawy, MD  atorvastatin (LIPITOR) 40 MG tablet TAKE ONE (1) TABLET EACH DAY 07/24/15   Mary-Margaret Hassell Done, FNP  calcitRIOL (ROCALTROL) 0.25 MCG capsule Take 0.25 mcg by mouth 3 (three) times a week. Mon.,Wed.,Fri. 07/11/15   Historical Provider, MD  Calcium Carbonate (CALTRATE 600 PO) Take 600 mg by mouth daily.    Historical Provider, MD  carvedilol (COREG) 12.5 MG tablet Take 1 tablet (12.5 mg total) by mouth 2 (two) times daily with a meal. 08/18/15   Albertine Patricia, MD  epoetin alfa (EPOGEN,PROCRIT) 2000 UNIT/ML injection 2,000 Units 3 (three) times a week. Patient has procrit administered at [facility] short stay every 3 weeks    Historical Provider, MD  fenofibrate (TRICOR) 145 MG tablet Take 1 tablet (145 mg total) by mouth daily. 07/24/15   Mary-Margaret Hassell Done, FNP  furosemide (LASIX) 20 MG tablet Take 1 tablet (20 mg total) by mouth daily. 07/24/15   Mary-Margaret Hassell Done, FNP  glimepiride (AMARYL) 2 MG tablet Take 1.5 tablets (3 mg total) by mouth daily before breakfast. 08/10/15   Tammy Eckard, PHARMD   BP 116/86 mmHg  Pulse 108  Resp 26  SpO2 95% Physical Exam  Constitutional: She is oriented to person, place, and time. She appears well-developed and well-nourished.  HENT:  Head: Normocephalic and atraumatic.  Mouth/Throat: Oropharynx is clear and moist.  Eyes: Conjunctivae are normal. Pupils are equal, round, and reactive to light.  Neck: Normal range of motion. Neck supple.  Cardiovascular: Normal heart sounds and intact distal pulses.  An irregularly irregular rhythm present. Tachycardia present.   No  murmur heard. Pulmonary/Chest: Effort normal and breath sounds normal. No accessory muscle usage or stridor. No respiratory distress. She has no wheezes. She has no rhonchi. She has no rales.  Abdominal: Soft. Bowel sounds are normal. She exhibits no distension. There is no tenderness.  Musculoskeletal: Normal range of motion.  Lymphadenopathy:    She has no cervical adenopathy.  Neurological: She is alert and oriented to person, place, and time.  Speech clear without dysarthria.  Skin: Skin is warm and dry.  Psychiatric: She has a normal mood and affect. Her behavior is normal.    ED Course  Procedures (including critical care time) Labs Review Labs Reviewed  BRAIN NATRIURETIC PEPTIDE - Abnormal; Notable for the following:    B Natriuretic Peptide 1382.0 (*)    All other components within normal limits  BASIC METABOLIC PANEL - Abnormal; Notable for the following:    Sodium 132 (*)    CO2 19 (*)    Glucose, Bld 533 (*)    BUN 42 (*)    Creatinine, Ser 2.58 (*)    GFR calc non Af Amer 16 (*)    GFR calc Af Amer 18 (*)    All other components within normal limits  CBC - Abnormal; Notable for the following:    Hemoglobin 11.5 (*)    RDW 15.8 (*)    All other components within normal limits  I-STAT TROPOININ, ED    Imaging Review Dg Chest 2 View  08/22/2015  CLINICAL DATA:  Chest pressure for 1 week EXAM: CHEST  2 VIEW COMPARISON:  08/17/2015 FINDINGS: Cardiomediastinal silhouette is stable. Both lungs are clear. Stable osteopenia and mild degenerative changes thoracic spine. Stable compression deformity lower thoracic spine. IMPRESSION: No active cardiopulmonary disease.  No significant change. Electronically Signed   By: Lahoma Crocker M.D.   On: 08/22/2015 13:33   I have personally reviewed and evaluated these images and lab results as part of my medical decision-making.   EKG Interpretation   Date/Time:  Tuesday August 22 2015 12:30:21 EST Ventricular Rate:  107 PR  Interval:    QRS Duration: 91 QT Interval:  370 QTC Calculation: 494 R Axis:   109 Text Interpretation:  Atrial fibrillation Right axis deviation Borderline  T abnormalities, diffuse leads Borderline prolonged QT interval no  significant change since Dec 2016 Confirmed by GOLDSTON  MD, SCOTT ([id number])  on 08/22/2015 1:07:28 PM      MDM   Final diagnoses:  Atrial fibrillation, unspecified type ([place])  Shortness of breath  AKI (acute kidney injury) ([place])    Patient presents with 1 day history of worsening SOB and chest tightness.  No fever, cough, CP.  VS show intermittent tachcardia 92-102, she is normotensive, she is tachypneic without hypoxia ranging from 92-97%.  On exam, heart sounds irregularly irregular.  Lungs CTAB, abdomen soft and benign.  Will obtain labs and CXR.  EKG shows atrial fibrilliation.  Troponin 0.07.  CXR shows no active cardiopulmonary disease.  No significant change. BNP elevated to 1382 from previous 700 5  days ago BMP shows CR 2.58, elevated from 2.1 5 days ago CBC shows no leukocytosis and hgb 11.5.  Will admit to hospitalist for AKI and CHF.  Case has been discussed with and seen by Dr. Regenia Skeeter who agrees with the above plan for admission.     Gloriann Loan, PA-C 08/22/15 1504  Sherwood Gambler, MD 08/24/15 [phone]

## 2015-08-22 NOTE — H&P (Signed)
[facility] History and Physical  XCARET TIMPERLEY [id number] DOB: 09/05/1926 DOA: 08/22/2015  Referring physician: Emergency Department PCP: Chevis Pretty, FNP   CHIEF COMPLAINT:  shortness of breath, chest tightness   HPI: Julie Carpenter is a 81 y.o. female  with multiple medical problems not limited to type 2 diabetes, chronic kidney disease and hypertension. Patient was seen by PCP 5 days ago with shortness of breath and elevated blood sugar. She had new T-wave inversions in anterior leads on EKG. Her weight was up with lower extremity swelling. We admitted the patient, felt dyspnea likely secondary to atrial fibrillation, not volume overload. BNP was in 700s but she had acute on chronic renal failure. . She had elevated troponin but this was felt to be secondary to demand ischemia secondary to tachycardia and uncontrolled blood pressure. She was treated for UTI as well as acute on chronic kidney disease during that admission.   Patient back in ED with dyspnea and chest tightness. BNP has doubled, Creatinine has continued to rise. Patient lives alone, fairly active but now finding it difficult to walk across the room. She cannot sleep without elevating her cell phone pillows and this has been going on for about 2 months. Lasix was decreased from 40-20 mg daily apparently 6 weeks ago. Patient was falling, felt to be secondary to hypotension.   ED COURSE:       Labs:   Hemoglobin 11.5 which is at baseline  white count normal. BNP 1382  up from 774 Trop 0.07 Sodium 132, bicarbonate 19, BUN 42, Cr 2.58, glucose 533   CXR:    No active cardiopulmonary disease. Stable compression deformity of lower thoracic spine  EKG:    Atrial fibrillation Right axis deviation Borderline T abnormalities, diffuse leads Borderline prolonged QT interval at 494   Medications  aspirin EC tablet 325 mg (not administered)   Review of Systems  Constitutional: Negative.   HENT:  Negative.   Eyes: Negative.   Respiratory: Positive for cough and shortness of breath.   Gastrointestinal: Positive for nausea and vomiting.  Genitourinary: Negative.   Musculoskeletal: Negative.   Skin: Negative.   Neurological: Negative.   Endo/Heme/Allergies: Negative.   Psychiatric/Behavioral: Negative.     Past Medical History  Diagnosis Date  . Hyperlipidemia   . Hypertension   . Diabetes mellitus without complication ([place])   . Atrial fibrillation [facility])    Past Surgical History  Procedure Laterality Date  . Abdominal hysterectomy    . Cholecystectomy    . Bladder tack    . Esophagogastroduodenoscopy N/A 04/06/2013    Procedure: ESOPHAGOGASTRODUODENOSCOPY (EGD);  Surgeon: Danie Binder, MD;  Location: [facility];  Service: Endoscopy;  Laterality: N/A;  8:45  . Givens capsule study  04/06/2013    Procedure: GIVENS CAPSULE STUDY;  Surgeon: Danie Binder, MD;  Location: [facility] ENDO SUITE;  Service: Endoscopy;;  . Colonoscopy N/A 05/03/2013    [id number] MOST LIKELY DUE TO ONE AVM in the ascending colon/Moderate diverticulosis hroughout the entire examined colon/Two POLYPS REMOVED/Small internal hemorrhoids    SOCIAL HISTORY:  reports that she has never smoked. She does not have any smokeless tobacco history on file. She reports that she does not drink alcohol or use illicit drugs. Lives: At home alone     Assistive devices:   He uses a cane as needed for ambulation.   Allergies  Allergen Reactions  . Penicillins Anaphylaxis    Has patient had a PCN reaction causing immediate rash,  facial/tongue/throat swelling, SOB or lightheadedness with hypotension: Yes Has patient had a PCN reaction causing severe rash involving mucus membranes or skin necrosis: No Has patient had a PCN reaction that required hospitalization No Has patient had a PCN reaction occurring within the last 10 years: No If all of the above answers are "NO", then may proceed with Cephalosporin use.  Marland Kitchen Benicar  [Olmesartan Medoxomil] Other (See Comments)    Lips swelling  . Ciprofloxacin Swelling  . Hctz [Hydrochlorothiazide] Other (See Comments)    Lips swelling  . Januvia [Sitagliptin Phosphate] Other (See Comments)    Lips swelling  . Norvasc [Amlodipine Besylate] Other (See Comments)    Lips swelling  . Ramipril Other (See Comments)    Lips swelling    Family History  Problem Relation Age of Onset  . Colon cancer Neg Hx     Prior to Admission medications   Medication Sig Start Date End Date Taking? Authorizing Provider  amitriptyline (ELAVIL) 50 MG tablet TAKE ONE TABLET DAILY AT BEDTIME 07/12/15   Mary-Margaret Hassell Done, FNP  aspirin EC 81 MG EC tablet Take 1 tablet (81 mg total) by mouth daily. 08/18/15   Silver Huguenin Elgergawy, MD  atorvastatin (LIPITOR) 40 MG tablet TAKE ONE (1) TABLET EACH DAY 07/24/15   Mary-Margaret Hassell Done, FNP  calcitRIOL (ROCALTROL) 0.25 MCG capsule Take 0.25 mcg by mouth 3 (three) times a week. Mon.,Wed.,Fri. 07/11/15   Historical Provider, MD  Calcium Carbonate (CALTRATE 600 PO) Take 600 mg by mouth daily.    Historical Provider, MD  carvedilol (COREG) 12.5 MG tablet Take 1 tablet (12.5 mg total) by mouth 2 (two) times daily with a meal. 08/18/15   Albertine Patricia, MD  epoetin alfa (EPOGEN,PROCRIT) 2000 UNIT/ML injection 2,000 Units 3 (three) times a week. Patient has procrit administered at [facility] short stay every 3 weeks    Historical Provider, MD  fenofibrate (TRICOR) 145 MG tablet Take 1 tablet (145 mg total) by mouth daily. 07/24/15   Mary-Margaret Hassell Done, FNP  furosemide (LASIX) 20 MG tablet Take 1 tablet (20 mg total) by mouth daily. 07/24/15   Mary-Margaret Hassell Done, FNP  glimepiride (AMARYL) 2 MG tablet Take 1.5 tablets (3 mg total) by mouth daily before breakfast. 08/10/15   Tammy Eckard, PHARMD   PHYSICAL EXAM: Filed Vitals:   08/22/15 1231 08/22/15 1300  BP: 102/71 119/79  Pulse: 92 91  Resp: 22 20  SpO2: 97% 92%    Wt Readings from Last 3  Encounters:  08/17/15 76.658 kg (169 lb)  08/17/15 76.658 kg (169 lb)  08/10/15 74.39 kg (164 lb)    General:  Pleasant white, well developed female. Appears calm and comfortable Eyes: PER, normal lids, irises & conjunctiva ENT: grossly normal hearing, lips & tongue Neck: no LAD, no masses Cardiovascular: regular rate, irregular rhythm, 1+ BLE edema.  Respiratory: Respirations slightly labored. Occasional expiratory wheezing in chest and bilateral upper lobes.    Abdomen: soft, non-distended, non-tender, active bowel sounds. No obvious masses.  Skin: no rash seen on limited exam Musculoskeletal: grossly normal tone BUE/BLE Psychiatric: grossly normal mood and affect, speech fluent and appropriate Neurologic: grossly non-focal.         LABS ON ADMISSION:    Basic Metabolic Panel:  Recent Labs Lab 08/17/15 1752 08/22/15 1324  NA 136 132*  K 4.0 4.7  CL 103 103  CO2 21* 19*  GLUCOSE 305* 533*  BUN 30* 42*  CREATININE 2.12* 2.58*  CALCIUM 9.3 9.4   Liver Function  Tests:  Recent Labs Lab 08/17/15 1752  AST 33  ALT 21  ALKPHOS 109  BILITOT 0.7  PROT 7.4  ALBUMIN 3.8   CBC:  Recent Labs Lab 08/17/15 1637 08/22/15 1324  WBC 8.3 7.8  NEUTROABS 6.4  --   HGB 11.7* 11.5*  HCT 35.8* 36.2  MCV 86.5 85.4  PLT 244 190    BNP (last 3 results)  Recent Labs  08/17/15 1636 08/22/15 1324  BNP 774.0* 1382.0*    CREATININE: 2.58 mg/dL ABNORMAL (08/22/15 1324) Estimated creatinine clearance - 15.7 mL/min  Radiological Exams on Admission: Dg Chest 2 View  08/22/2015  CLINICAL DATA:  Chest pressure for 1 week EXAM: CHEST  2 VIEW COMPARISON:  08/17/2015 FINDINGS: Cardiomediastinal silhouette is stable. Both lungs are clear. Stable osteopenia and mild degenerative changes thoracic spine. Stable compression deformity lower thoracic spine. IMPRESSION: No active cardiopulmonary disease.  No significant change. Electronically Signed   By: Lahoma Crocker M.D.   On: 08/22/2015  13:33    ASSESSMENT / PLAN   Dyspnea. This has been ongoing for approximately two months. She was was admitted at [facility] a few days ago for exertional dyspnea and suboptimally controlled atrial fibrillation. Echo revealed normal EF. mild left LV hypertrophy, mild MR.  Atrial fibrillation precluded adequate evaluation of LV diastolic dysfunction. Cardiology evaluated, patient was not a candidate for invasive workup. She was not felt to be in heart failure. Her Coreg was increased to 12.5 mg twice a day in an attempt to better control HR and help with exertional dyspnea. . Back in emergency department with progressive dyspnea / chest tightness.  -admit to Observation - Telemetry bed -Continue home beta blocker -Trial of IV lasix with close monitoring of renal function. -Trial of Nebulizer  -Continue 02 per [place]. She desaturated while I was in room (upper 80's). Placed 02 [place] 2L/min and sats up to mid 3's -Obtain a d-dimer, if positive consider VQ scan. -Strict I&0 -daily weights.  -If no improvement, consider Pulmonary consult and / or trial of steroids for possible primary lung disease.   Pyuria. Urinalysis during recent admission 08/18/15 reveals many bacteria, neg nitrites, WBC 6-30. May have been contaminated. No dysuria.  -Repeat u/a  Chronic kidney disease stage 3. Creatinine has continued to rise since recent discharge when it was up from 1.45 to 2.12. Cr now at 2.45. Lasix was actually decreased from 40mg  to 20mg  several weeks ago.   Type II diabetes,uncontrolled. BS 533. They have generally been under 200 at home per daughter.   -hold home oral diabetic medications. No on insulin at home but Amaryl recently increase. Hgb A1. 8.7 mid November -monitor CBGs, start SSI    Atrial fibrillation, rate controlled in the 90's. CHADS VASC score 5. Coreg increased to 12.5 twice a day for heart rate control during last admission. Not Coumadin candidate secondary to history of GI  bleeding. -Monitor on telemetry -Continue home coreg.   HTN, BP normal today.   CONSULTANTS:     Code Status:  DNR DVT Prophylaxis:  Heparin Family Communication:   Patient alert, oriented and understands plan of care.  Disposition Plan: Discharge to home in 24-48 hours   Time spent: 60 minutes [name]  NP [facility] Pager [phone]

## 2015-08-22 NOTE — ED Notes (Signed)
Pt given water and took initial sips ok and then choked severely.  Notified Rose, [place].

## 2015-08-22 NOTE — ED Notes (Signed)
Pt to bathroom - became extremely out of breath, had to rest multiple times to catch breath. O2 when returned to room 92%

## 2015-08-22 NOTE — Telephone Encounter (Signed)
Left message on patient's voicemail for her to return my call today if possible. 2nd contact attempt made. Left message on home voicemail to return my call.   Further chart review shows that patient was readmitted yesterday. Will close encounter.     Recommendations for primary care physician for things to follow:  - Please check CBC, BMP during next visit. - Patient carvedilol dose was increased, please reassess your next visit to see if it needs to be further titrated to heart rate.

## 2015-08-22 NOTE — ED Notes (Addendum)
Pt from home via [place] with c/o SOB since yesterday.  Pt audibly wheezing on ED arrival, lungs clear, reports wheezing started today.  Pt was seen for same recently at [facility].  Pt reports chest tightness but when asked to point, placed hand at/below umbilicus.  Pt in NAD, A&O.

## 2015-08-22 NOTE — Progress Notes (Signed)
Pt arrived on unit, with CBG = 476.  Contacted [name], NP, as she orders to give Novolog 9 units now, and then recheck CBG at HS.  If still high, contact on call physician. Swallow evaluation with pt is negative.  Ok to give Heart Healthy/ Carb Modified diet.  Pt instructed to take slow, small sips of liquids. Pt arrived on [facility] Unit; pt has been oriented to room, surroundings, and any applicable equipment. VSS.  CCMD notified of telemetry. Pt has been educated on the [employer] .  Pt in stable condition.

## 2015-08-23 ENCOUNTER — Inpatient Hospital Stay (HOSPITAL_COMMUNITY): Payer: Medicare Other

## 2015-08-23 ENCOUNTER — Observation Stay (HOSPITAL_COMMUNITY): Payer: Medicare Other

## 2015-08-23 ENCOUNTER — Encounter (HOSPITAL_COMMUNITY): Payer: Medicare Other

## 2015-08-23 ENCOUNTER — Encounter (HOSPITAL_COMMUNITY): Admission: RE | Admit: 2015-08-23 | Payer: Medicare Other | Source: Ambulatory Visit

## 2015-08-23 DIAGNOSIS — I2690 Septic pulmonary embolism without acute cor pulmonale: Secondary | ICD-10-CM | POA: Diagnosis not present

## 2015-08-23 DIAGNOSIS — I482 Chronic atrial fibrillation: Secondary | ICD-10-CM | POA: Diagnosis not present

## 2015-08-23 DIAGNOSIS — N183 Chronic kidney disease, stage 3 (moderate): Secondary | ICD-10-CM | POA: Diagnosis not present

## 2015-08-23 DIAGNOSIS — J9601 Acute respiratory failure with hypoxia: Secondary | ICD-10-CM | POA: Diagnosis present

## 2015-08-23 DIAGNOSIS — N390 Urinary tract infection, site not specified: Secondary | ICD-10-CM | POA: Diagnosis present

## 2015-08-23 DIAGNOSIS — Z7982 Long term (current) use of aspirin: Secondary | ICD-10-CM | POA: Diagnosis not present

## 2015-08-23 DIAGNOSIS — I82512 Chronic embolism and thrombosis of left femoral vein: Secondary | ICD-10-CM | POA: Diagnosis present

## 2015-08-23 DIAGNOSIS — E0801 Diabetes mellitus due to underlying condition with hyperosmolarity with coma: Secondary | ICD-10-CM

## 2015-08-23 DIAGNOSIS — N179 Acute kidney failure, unspecified: Secondary | ICD-10-CM | POA: Diagnosis not present

## 2015-08-23 DIAGNOSIS — Z7984 Long term (current) use of oral hypoglycemic drugs: Secondary | ICD-10-CM | POA: Diagnosis not present

## 2015-08-23 DIAGNOSIS — N184 Chronic kidney disease, stage 4 (severe): Secondary | ICD-10-CM | POA: Diagnosis present

## 2015-08-23 DIAGNOSIS — I2699 Other pulmonary embolism without acute cor pulmonale: Secondary | ICD-10-CM | POA: Diagnosis present

## 2015-08-23 DIAGNOSIS — Z9181 History of falling: Secondary | ICD-10-CM | POA: Diagnosis not present

## 2015-08-23 DIAGNOSIS — D649 Anemia, unspecified: Secondary | ICD-10-CM | POA: Diagnosis present

## 2015-08-23 DIAGNOSIS — R5381 Other malaise: Secondary | ICD-10-CM | POA: Diagnosis present

## 2015-08-23 DIAGNOSIS — I959 Hypotension, unspecified: Secondary | ICD-10-CM | POA: Diagnosis present

## 2015-08-23 DIAGNOSIS — Z794 Long term (current) use of insulin: Secondary | ICD-10-CM

## 2015-08-23 DIAGNOSIS — E785 Hyperlipidemia, unspecified: Secondary | ICD-10-CM | POA: Diagnosis present

## 2015-08-23 DIAGNOSIS — E1165 Type 2 diabetes mellitus with hyperglycemia: Secondary | ICD-10-CM | POA: Diagnosis present

## 2015-08-23 DIAGNOSIS — R0600 Dyspnea, unspecified: Secondary | ICD-10-CM

## 2015-08-23 DIAGNOSIS — I82531 Chronic embolism and thrombosis of right popliteal vein: Secondary | ICD-10-CM | POA: Diagnosis present

## 2015-08-23 DIAGNOSIS — E1122 Type 2 diabetes mellitus with diabetic chronic kidney disease: Secondary | ICD-10-CM | POA: Diagnosis present

## 2015-08-23 DIAGNOSIS — R0602 Shortness of breath: Secondary | ICD-10-CM | POA: Diagnosis present

## 2015-08-23 DIAGNOSIS — Z79899 Other long term (current) drug therapy: Secondary | ICD-10-CM | POA: Diagnosis not present

## 2015-08-23 DIAGNOSIS — I4891 Unspecified atrial fibrillation: Secondary | ICD-10-CM | POA: Diagnosis not present

## 2015-08-23 DIAGNOSIS — Z66 Do not resuscitate: Secondary | ICD-10-CM | POA: Diagnosis present

## 2015-08-23 DIAGNOSIS — I82511 Chronic embolism and thrombosis of right femoral vein: Secondary | ICD-10-CM | POA: Diagnosis present

## 2015-08-23 DIAGNOSIS — I129 Hypertensive chronic kidney disease with stage 1 through stage 4 chronic kidney disease, or unspecified chronic kidney disease: Secondary | ICD-10-CM | POA: Diagnosis present

## 2015-08-23 DIAGNOSIS — I825Z1 Chronic embolism and thrombosis of unspecified deep veins of right distal lower extremity: Secondary | ICD-10-CM | POA: Diagnosis present

## 2015-08-23 LAB — CBC
HCT: 32.5 % — ABNORMAL LOW (ref 36.0–46.0)
Hemoglobin: 10.4 g/dL — ABNORMAL LOW (ref 12.0–15.0)
MCH: 27.4 pg (ref 26.0–34.0)
MCHC: 32 g/dL (ref 30.0–36.0)
MCV: 85.8 fL (ref 78.0–100.0)
PLATELETS: 189 10*3/uL (ref 150–400)
RBC: 3.79 MIL/uL — AB (ref 3.87–5.11)
RDW: 15.8 % — ABNORMAL HIGH (ref 11.5–15.5)
WBC: 8 10*3/uL (ref 4.0–10.5)

## 2015-08-23 LAB — BASIC METABOLIC PANEL
Anion gap: 10 (ref 5–15)
BUN: 45 mg/dL — ABNORMAL HIGH (ref 6–20)
CALCIUM: 9.2 mg/dL (ref 8.9–10.3)
CHLORIDE: 101 mmol/L (ref 101–111)
CO2: 22 mmol/L (ref 22–32)
CREATININE: 3.02 mg/dL — AB (ref 0.44–1.00)
GFR calc non Af Amer: 13 mL/min — ABNORMAL LOW (ref >60)
GFR, EST AFRICAN AMERICAN: 15 mL/min — AB (ref >60)
GLUCOSE: 306 mg/dL — AB (ref 65–99)
Potassium: 4.3 mmol/L (ref 3.5–5.1)
Sodium: 133 mmol/L — ABNORMAL LOW (ref 135–145)

## 2015-08-23 LAB — GLUCOSE, CAPILLARY
GLUCOSE-CAPILLARY: 163 mg/dL — AB (ref 65–99)
GLUCOSE-CAPILLARY: 167 mg/dL — AB (ref 65–99)
GLUCOSE-CAPILLARY: 300 mg/dL — AB (ref 65–99)
Glucose-Capillary: 159 mg/dL — ABNORMAL HIGH (ref 65–99)
Glucose-Capillary: 183 mg/dL — ABNORMAL HIGH (ref 65–99)

## 2015-08-23 LAB — TROPONIN I
Troponin I: 0.08 ng/mL — ABNORMAL HIGH (ref <0.031)
Troponin I: 0.09 ng/mL — ABNORMAL HIGH (ref <0.031)
Troponin I: 0.11 ng/mL — ABNORMAL HIGH (ref <0.031)

## 2015-08-23 LAB — URINE MICROSCOPIC-ADD ON

## 2015-08-23 LAB — URINALYSIS, ROUTINE W REFLEX MICROSCOPIC
Bilirubin Urine: NEGATIVE
GLUCOSE, UA: NEGATIVE mg/dL
Ketones, ur: NEGATIVE mg/dL
NITRITE: NEGATIVE
PH: 5.5 (ref 5.0–8.0)
Protein, ur: 30 mg/dL — AB
SPECIFIC GRAVITY, URINE: 1.017 (ref 1.005–1.030)

## 2015-08-23 MED ORDER — NITROGLYCERIN 0.4 MG SL SUBL
SUBLINGUAL_TABLET | SUBLINGUAL | Status: AC
  Administered 2015-08-23: 0.4 mg
  Filled 2015-08-23: qty 1

## 2015-08-23 MED ORDER — HEPARIN BOLUS VIA INFUSION
3000.0000 [IU] | Freq: Once | INTRAVENOUS | Status: AC
  Administered 2015-08-23: 3000 [IU] via INTRAVENOUS
  Filled 2015-08-23: qty 3000

## 2015-08-23 MED ORDER — CETYLPYRIDINIUM CHLORIDE 0.05 % MT LIQD
7.0000 mL | Freq: Two times a day (BID) | OROMUCOSAL | Status: DC
  Administered 2015-08-23 – 2015-08-29 (×12): 7 mL via OROMUCOSAL

## 2015-08-23 MED ORDER — HEPARIN (PORCINE) IN NACL 100-0.45 UNIT/ML-% IJ SOLN
1050.0000 [IU]/h | INTRAMUSCULAR | Status: DC
  Administered 2015-08-23: 1050 [IU]/h via INTRAVENOUS
  Filled 2015-08-23: qty 250

## 2015-08-23 MED ORDER — NITROGLYCERIN 0.4 MG SL SUBL: 0.4000 mg | SUBLINGUAL_TABLET | SUBLINGUAL | Status: DC | PRN

## 2015-08-23 MED ORDER — TECHNETIUM TO 99M ALBUMIN AGGREGATED
4.1000 | Freq: Once | INTRAVENOUS | Status: AC | PRN
  Administered 2015-08-23: 4 via INTRAVENOUS

## 2015-08-23 MED ORDER — TECHNETIUM TC 99M DIETHYLENETRIAME-PENTAACETIC ACID: 31.1000 | Freq: Once | INTRAVENOUS | Status: DC | PRN

## 2015-08-23 NOTE — Progress Notes (Signed)
Pt c/o chest tightness.  Performed EKG that resulted A-fib.  Gave Nitro 0.4mg  SL.  BP 95/60, HR 89.  Pt is now CP free.  Physician was notified.

## 2015-08-23 NOTE — Progress Notes (Signed)
[facility]                                                                              Patient Demographics  Julie Carpenter, is a 81 y.o. female, DOB - 04-08-1926, [id number]  Admit date - 08/22/2015   Admitting Physician Waldemar Dickens, MD  Outpatient Primary MD for the patient is Chevis Pretty, [place]  LOS - 1   Chief Complaint  Patient presents with  . Shortness of Breath      HPI on 08/22/2015 by Ms. [name] NP/Dr. Linna Darner Julie Carpenter is a 81 y.o. female with multiple medical problems not limited to type 2 diabetes, chronic kidney disease and hypertension. Patient was seen by PCP 5 days ago with shortness of breath and elevated blood sugar. She had new T-wave inversions in anterior leads on EKG. Her weight was up with lower extremity swelling. We admitted the patient, felt dyspnea likely secondary to atrial fibrillation, not volume overload. BNP was in 700s but she had acute on chronic renal failure. . She had elevated troponin but this was felt to be secondary to demand ischemia secondary to tachycardia and uncontrolled blood pressure. She was treated for UTI as well as acute on chronic kidney disease during that admission.   Patient back in ED with dyspnea and chest tightness. BNP has doubled, Creatinine has continued to rise. Patient lives alone, fairly active but now finding it difficult to walk across the room. She cannot sleep without elevating her cell phone pillows and this has been going on for about 2 months. Lasix was decreased from 40-20 mg daily apparently 6 weeks ago. Patient was falling, felt to be secondary to hypotension.    Assessment & Plan   Acute respiratory failure with hypoxia/Dyspnea -Likely multifactorial: ?PE vs CHF vs Afib -O2 sats were noted to be 86 upon admission -Patient was recently admitted and discharged from [facility] for exertional dyspnea -Echocardiogram from 08/18/2015 shows an EF of 65-70%, unable to  evaluate diastolic function -Patient does have atrial fibrillation however does seem to be rate controlled at this time -D-dimer was elevated at 5.98 -V/Q scan obtained, showing high probability for PE -Patient started on heparin per pharmacy -Will obtain lower extremity Dopplers to rule out DVT -Continue supplemental oxygen to maintain saturations above 92%  ?Acute diastolic dysfunction -Last echocardiogram could not evaluate diastolic function due to atrial fibrillation -Will continue to monitor intake and output, daily weight -BNP at admission 1382 -lasix held due to AKI  Mild elevation in troponin -Possibly secondary to the above -Will continue to cycle -Patient was evaluate by cardiology of her last admission and found to be not a candidate for invasive workup  Pyuria -UA on 08/18/2015 showed many bacteria, WBC 6-30, negative nitrites. -Repeat UA pending  Acute on chronic Chronic kidney disease, stage IV -Baseline creatinine appears to be between 1.7-2 -Currently 3.02 -Hold Lasix  Type 2 diabetes mellitus -continue insulin sliding scale a CBG monitoring -HbA1C 07/24/2015: 8.7  Atrial fibrillation -CHADSVASC 5 -Patient not on anticoagulation due to history of GI bleed -Will speak with daughter regarding this  Hypertension -Stable, continue Coreg  Hyperlipidemia -Continue statin, fenofibrate  Code  Status: DNR  Family Communication: None at bedside, daughter via phone  Disposition Plan: Admitted.   Time Spent in minutes   30 minutes  Procedures  VQ scan  Consults   None  DVT Prophylaxis  heparin  Lab Results  Component Value Date   PLT 189 08/23/2015    Medications  Scheduled Meds: . amitriptyline  50 mg Oral QHS  . antiseptic oral rinse  7 mL Mouth Rinse BID  . aspirin EC  325 mg Oral Once  . atorvastatin  40 mg Oral q morning - 10a  . calcitRIOL  0.25 mcg Oral Once per day on Mon Wed Fri  . carvedilol  12.5 mg Oral BID WC  . enoxaparin  (LOVENOX) injection  30 mg Subcutaneous Q24H  . fenofibrate  54 mg Oral Daily  . insulin aspart  0-9 Units Subcutaneous TID WC & HS   Continuous Infusions:  PRN Meds:.acetaminophen **OR** acetaminophen, HYDROcodone-acetaminophen, ipratropium-albuterol, morphine injection, ondansetron **OR** ondansetron (ZOFRAN) IV, technetium TC 15M diethylenetriame-pentaacetic acid  Antibiotics    Anti-infectives    None     Subjective:   Julie Carpenter seen and examined today.  Patient continues to complain of some shortness of breath.  Denies chest pain, abdominal pain, dizziness, headache.   Objective:   Filed Vitals:   08/22/15 2038 08/23/15 0034 08/23/15 0520 08/23/15 0821  BP: 90/53 122/78 106/74 100/72  Pulse: 98 72 88 90  Temp: 97.3 F (36.3 C) 97.6 F (36.4 C) 97.4 F (36.3 C)   TempSrc: Oral Oral Oral   Resp: 18 18 19    Height:      Weight:   74.934 kg (165 lb 3.2 oz)   SpO2: 100% 100% 97%     Wt Readings from Last 3 Encounters:  08/23/15 74.934 kg (165 lb 3.2 oz)  08/17/15 76.658 kg (169 lb)  08/17/15 76.658 kg (169 lb)     Intake/Output Summary (Last 24 hours) at 08/23/15 1301 Last data filed at 08/23/15 [id number]  Gross per 24 hour  Intake    595 ml  Output    375 ml  Net    220 ml    Exam  General: Well developed, well nourished, NAD, appears stated age  HEENT: NCAT, mucous membranes moist.   Cardiovascular: S1 S2 auscultated, Irregular  Respiratory: Clear to auscultation bilaterally   Abdomen: Soft, nontender, nondistended, + bowel sounds  Extremities: warm dry without cyanosis clubbing. +1 edema in LE B/L  Neuro: AAOx3, nonfocal  Psych: Normal affect and demeanor   Data Review   Micro Results No results found for this or any previous visit (from the past 240 hour(s)).  Radiology Reports Dg Chest 2 View  08/22/2015  CLINICAL DATA:  Chest pressure for 1 week EXAM: CHEST  2 VIEW COMPARISON:  08/17/2015 FINDINGS: Cardiomediastinal silhouette is stable.  Both lungs are clear. Stable osteopenia and mild degenerative changes thoracic spine. Stable compression deformity lower thoracic spine. IMPRESSION: No active cardiopulmonary disease.  No significant change. Electronically Signed   By: Lahoma Crocker M.D.   On: 08/22/2015 13:33   Dg Chest 2 View  08/17/2015  CLINICAL DATA:  Shortness of breath for 2 days. Abnormal EKG. Atrial fibrillation. Diabetes and hypertension. EXAM: CHEST  2 VIEW COMPARISON:  None. FINDINGS: The heart size and mediastinal contours are within normal limits. Both lungs are clear. No evidence of pneumothorax or pleural effusion. Old lower thoracic vertebral body compression fracture deformity noted as well as mild thoracic spine degenerative changes. IMPRESSION: No  active cardiopulmonary disease. Electronically Signed   By: Earle Gell M.D.   On: 08/17/2015 17:16   Nm Pulmonary Perf And Vent  08/23/2015  CLINICAL DATA:  Shortness of breath, wheezing, positive D-dimer EXAM: NUCLEAR MEDICINE VENTILATION - PERFUSION LUNG SCAN TECHNIQUE: Ventilation images were obtained in multiple projections using inhaled aerosol Tc-43m DTPA. Perfusion images were obtained in multiple projections after intravenous injection of Tc-68m MAA. RADIOPHARMACEUTICALS:  [id number] millicurie [id number] DTPA aerosol inhalation and 4.1 millicurie [id number] MAA IV COMPARISON:  Chest x-ray 08/22/2015 FINDINGS: Ventilation: No focal ventilation defect. Perfusion: There is decreased perfusion right upper lobe apical and right lower lobe laterally. The chest x-ray is unremarkable. Findings are high probability for pulmonary embolus. IMPRESSION: No ventilation defects. There is decreased perfusion in right apical lung and right lower lobe laterally. No chest x-ray abnormality. Findings are high probability for pulmonary embolus. Electronically Signed   By: Lahoma Crocker M.D.   On: 08/23/2015 11:51    CBC  Recent Labs Lab 08/17/15 1637 08/22/15 1324 08/23/15 0006  WBC  8.3 7.8 8.0  HGB 11.7* 11.5* 10.4*  HCT 35.8* 36.2 32.5*  PLT 244 190 189  MCV 86.5 85.4 85.8  MCH 28.3 27.1 27.4  MCHC 32.7 31.8 32.0  RDW 15.1 15.8* 15.8*  LYMPHSABS 1.0  --   --   MONOABS 0.8  --   --   EOSABS 0.1  --   --   BASOSABS 0.0  --   --     Chemistries   Recent Labs Lab 08/17/15 1752 08/22/15 1324 08/23/15 0006  NA 136 132* 133*  K 4.0 4.7 4.3  CL 103 103 101  CO2 21* 19* 22  GLUCOSE 305* 533* 306*  BUN 30* 42* 45*  CREATININE 2.12* 2.58* 3.02*  CALCIUM 9.3 9.4 9.2  AST 33  --   --   ALT 21  --   --   ALKPHOS 109  --   --   BILITOT 0.7  --   --    ------------------------------------------------------------------------------------------------------------------ estimated creatinine clearance is 13.3 mL/min (by C-G formula based on Cr of 3.02). ------------------------------------------------------------------------------------------------------------------ No results for input(s): HGBA1C in the last 72 hours. ------------------------------------------------------------------------------------------------------------------ No results for input(s): CHOL, HDL, LDLCALC, TRIG, CHOLHDL, LDLDIRECT in the last 72 hours. ------------------------------------------------------------------------------------------------------------------ No results for input(s): TSH, T4TOTAL, T3FREE, THYROIDAB in the last 72 hours.  Invalid input(s): FREET3 ------------------------------------------------------------------------------------------------------------------ No results for input(s): VITAMINB12, FOLATE, FERRITIN, TIBC, IRON, RETICCTPCT in the last 72 hours.  Coagulation profile No results for input(s): INR, PROTIME in the last 168 hours.   Recent Labs  08/22/15 1842  DDIMER 5.78*    Cardiac Enzymes  Recent Labs Lab 08/18/15 1533 08/22/15 1842 08/23/15 0006  TROPONINI 0.06* 0.08* 0.11*    ------------------------------------------------------------------------------------------------------------------ Invalid input(s): POCBNP    Sandford Diop D.O. on 08/23/2015 at 1:01 PM  Between 7am to 7pm - Pager - [phone]  After 7pm go to [email or web address] - password [email or web address]  And look for the night coverage person covering for me after hours  [facility] Office  [phone]

## 2015-08-23 NOTE — Progress Notes (Signed)
The patient's CBG was 361.  Julie Carpenter was notified.  New orders were given.

## 2015-08-23 NOTE — Progress Notes (Signed)
Dr. Ree Kida notified of V/Q results, high probability of pulm embolus.  She stated to expect heparin gtts orders.

## 2015-08-23 NOTE — Progress Notes (Addendum)
The patient's D-Dimer is 5.78.  Julie Carpenter notified.  New orders given.

## 2015-08-23 NOTE — Progress Notes (Signed)
ANTICOAGULATION CONSULT NOTE - Initial Consult  Pharmacy Consult:  Heparin Indication:  New PE  Allergies  Allergen Reactions  . Penicillins Anaphylaxis    Has patient had a PCN reaction causing immediate rash, facial/tongue/throat swelling, SOB or lightheadedness with hypotension: Yes Has patient had a PCN reaction causing severe rash involving mucus membranes or skin necrosis: No Has patient had a PCN reaction that required hospitalization No Has patient had a PCN reaction occurring within the last 10 years: No If all of the above answers are "NO", then may proceed with Cephalosporin use.  Marland Kitchen Benicar [Olmesartan Medoxomil] Swelling    Lips swelling  . Ciprofloxacin Swelling  . Hctz [Hydrochlorothiazide] Swelling    Lips swelling  . Januvia [Sitagliptin Phosphate] Swelling    Lips swelling  . Norvasc [Amlodipine Besylate] Swelling    Lips swelling  . Ramipril Swelling    Lips swelling    Patient Measurements: Height: 5\' 5"  (165.1 cm) Weight: 165 lb 3.2 oz (74.934 kg) (Scale B) IBW/kg (Calculated) : 57 Heparin Dosing Weight:  72 kg  Vital Signs: Temp: 97.4 F (36.3 C) (12/14 0520) Temp Source: Oral (12/14 0520) BP: 100/72 mmHg (12/14 0821) Pulse Rate: 90 (12/14 0821)  Labs:  Recent Labs  08/22/15 1324 08/22/15 1842 08/23/15 0006  HGB 11.5*  --  10.4*  HCT 36.2  --  32.5*  PLT 190  --  189  CREATININE 2.58*  --  3.02*  TROPONINI  --  0.08* 0.11*    Estimated Creatinine Clearance: 13.3 mL/min (by C-G formula based on Cr of 3.02).   Medical History: Past Medical History  Diagnosis Date  . Hyperlipidemia   . Hypertension   . Diabetes mellitus without complication ([place])   . Atrial fibrillation ([place])        Assessment: Julie Carpenter presented with complaints of SOB and chest tightness.  VQ scan showed high probability of PE and Pharmacy consulted to initiate IV heparin.  Patient's last received Lovenox SQ on 08/22/15.  Baseline labs reviewed.   Goal of  Therapy:  Heparin level 0.3-0.7 units/ml Monitor platelets by anticoagulation protocol: Yes    Plan:  - Heparin 3000 units IV x 1, then - Heparin gtt at 1050 units/hr - Check 8 hr HL - Daily HL / CBC - Monitor closely for s/sx of bleeding - F/U long-term AC plans   [name] D. Mina Marble, PharmD, BCPS Pager:  [phone] 08/23/2015, 1:27 PM

## 2015-08-23 NOTE — Evaluation (Signed)
Clinical/Bedside Swallow Evaluation Patient Details  Name: Julie Carpenter MRN: [id number] Date of Birth: 26-Aug-1926  Today's Date: 08/23/2015 Time: SLP Start Time (ACUTE ONLY): 1138 SLP Stop Time (ACUTE ONLY): 1150 SLP Time Calculation (min) (ACUTE ONLY): 12 min  Past Medical History:  Past Medical History  Diagnosis Date  . Hyperlipidemia   . Hypertension   . Diabetes mellitus without complication ([place])   . Atrial fibrillation [facility])    Past Surgical History:  Past Surgical History  Procedure Laterality Date  . Abdominal hysterectomy    . Cholecystectomy    . Bladder tack    . Esophagogastroduodenoscopy N/A 04/06/2013    Procedure: ESOPHAGOGASTRODUODENOSCOPY (EGD);  Surgeon: Danie Binder, MD;  Location: [facility] ENDO [facility];  Service: Endoscopy;  Laterality: N/A;  8:45  . Givens capsule study  04/06/2013    Procedure: GIVENS CAPSULE STUDY;  Surgeon: Danie Binder, MD;  Location: [facility] ENDO SUITE;  Service: Endoscopy;;  . Colonoscopy N/A 05/03/2013    [id number] MOST LIKELY DUE TO ONE AVM in the ascending colon/Moderate diverticulosis hroughout the entire examined colon/Two POLYPS REMOVED/Small internal hemorrhoids   HPI:  81 y.o. female with PMH: type 2 diabetes, chronic kidney disease and hypertension admitted with SOB. Hospital admit 5 days ago with shortness of breath, elevated blood sugar, new T-wave inversions in anterior leads on EKG. Per MD note patient lives alone, fairly active but now finding it difficult to walk across the room.   CXR No active cardiopulmonary disease. ED notes states Pt given water and took initial sips ok and then choked severely.   Assessment / Plan / Recommendation Clinical Impression  Pt's daughter reports her mom drinks fast and takes big sips and feels that caused coughing episode yesterday in the ER. Pharyngeal function appeared normal other than mildly decreased palpable laryngeal elevation. Prolonged mastication with cracker (mild). She does not wear  dentures to eat and does not have difficulty with meats/lettuce according to daughter. SLP educated and reiterated importance of slow rate and smaller sips to decrease chance of pneumonia. Recommend continue regular texture/thin liquids, prefer pt to use cup versus straw (dtr in agreement). No further ST needed.      Aspiration Risk  Mild aspiration risk    Diet Recommendation   Regular, thin, pills with liquid  Medication Administration: Whole meds with liquid    Other  Recommendations Oral Care Recommendations: Oral care BID   Follow up Recommendations  None    Frequency and Duration            Prognosis        Swallow Study   General HPI: 81 y.o. female with PMH: type 2 diabetes, chronic kidney disease and hypertension admitted with SOB. Hospital admit 5 days ago with shortness of breath, elevated blood sugar, new T-wave inversions in anterior leads on EKG. Per MD note patient lives alone, fairly active but now finding it difficult to walk across the room.   CXR No active cardiopulmonary disease. ED notes states Pt given water and took initial sips ok and then choked severely. Type of Study: Bedside Swallow Evaluation Previous Swallow Assessment:  (none) Diet Prior to this Study: Regular;Thin liquids Temperature Spikes Noted: No Respiratory Status: Nasal cannula History of Recent Intubation: No Behavior/Cognition: Alert;Cooperative;Pleasant mood Oral Cavity Assessment: Within Functional Limits Oral Care Completed by SLP: No Oral Cavity - Dentition: Edentulous Vision: Functional for self-feeding Self-Feeding Abilities: Able to feed self Patient Positioning: Upright in bed Baseline Vocal Quality: Normal Volitional Cough: Strong  Volitional Swallow: Able to elicit    Oral/Motor/Sensory Function Overall Oral Motor/Sensory Function: Within functional limits   Ice Chips Ice chips: Not tested   Thin Liquid Thin Liquid: Impaired Presentation: Cup;Straw Oral Phase Impairments:   (none) Oral Phase Functional Implications:  (none) Pharyngeal  Phase Impairments: Decreased hyoid-laryngeal movement    Nectar Thick Nectar Thick Liquid: Not tested   Honey Thick Honey Thick Liquid: Not tested   Puree Puree: Not tested   Solid Solid: Impaired Oral Phase Impairments: Impaired mastication Oral Phase Functional Implications: Prolonged oral transit       Houston Siren 08/23/2015,12:09 PM   Orbie Pyo Colvin Caroli.Ed [employer] [phone]

## 2015-08-24 ENCOUNTER — Ambulatory Visit (HOSPITAL_COMMUNITY): Payer: Medicare Other

## 2015-08-24 DIAGNOSIS — I2699 Other pulmonary embolism without acute cor pulmonale: Principal | ICD-10-CM

## 2015-08-24 LAB — CBC
HCT: 32.1 % — ABNORMAL LOW (ref 36.0–46.0)
HEMOGLOBIN: 10.3 g/dL — AB (ref 12.0–15.0)
MCH: 27.8 pg (ref 26.0–34.0)
MCHC: 32.1 g/dL (ref 30.0–36.0)
MCV: 86.5 fL (ref 78.0–100.0)
PLATELETS: 210 10*3/uL (ref 150–400)
RBC: 3.71 MIL/uL — ABNORMAL LOW (ref 3.87–5.11)
RDW: 16.4 % — AB (ref 11.5–15.5)
WBC: 7.5 10*3/uL (ref 4.0–10.5)

## 2015-08-24 LAB — GLUCOSE, CAPILLARY
GLUCOSE-CAPILLARY: 191 mg/dL — AB (ref 65–99)
GLUCOSE-CAPILLARY: 201 mg/dL — AB (ref 65–99)
Glucose-Capillary: 210 mg/dL — ABNORMAL HIGH (ref 65–99)
Glucose-Capillary: 243 mg/dL — ABNORMAL HIGH (ref 65–99)

## 2015-08-24 LAB — PROTIME-INR
INR: 1.37 (ref 0.00–1.49)
Prothrombin Time: 17 seconds — ABNORMAL HIGH (ref 11.6–15.2)

## 2015-08-24 LAB — BASIC METABOLIC PANEL
Anion gap: 11 (ref 5–15)
BUN: 62 mg/dL — AB (ref 6–20)
CALCIUM: 9.2 mg/dL (ref 8.9–10.3)
CHLORIDE: 104 mmol/L (ref 101–111)
CO2: 19 mmol/L — ABNORMAL LOW (ref 22–32)
CREATININE: 2.99 mg/dL — AB (ref 0.44–1.00)
GFR calc non Af Amer: 13 mL/min — ABNORMAL LOW (ref >60)
GFR, EST AFRICAN AMERICAN: 15 mL/min — AB (ref >60)
Glucose, Bld: 245 mg/dL — ABNORMAL HIGH (ref 65–99)
Potassium: 4.5 mmol/L (ref 3.5–5.1)
SODIUM: 134 mmol/L — AB (ref 135–145)

## 2015-08-24 LAB — HEPARIN LEVEL (UNFRACTIONATED)
HEPARIN UNFRACTIONATED: 1.1 [IU]/mL — AB (ref 0.30–0.70)
HEPARIN UNFRACTIONATED: 0.53 [IU]/mL (ref 0.30–0.70)

## 2015-08-24 LAB — TROPONIN I: TROPONIN I: 0.08 ng/mL — AB (ref <0.031)

## 2015-08-24 MED ORDER — WARFARIN - PHARMACIST DOSING INPATIENT
Freq: Every day | Status: DC
  Administered 2015-08-24 – 2015-08-27 (×4)

## 2015-08-24 MED ORDER — HEPARIN (PORCINE) IN NACL 100-0.45 UNIT/ML-% IJ SOLN
850.0000 [IU]/h | INTRAMUSCULAR | Status: DC
  Administered 2015-08-24 – 2015-08-28 (×5): 850 [IU]/h via INTRAVENOUS
  Filled 2015-08-24 (×5): qty 250

## 2015-08-24 MED ORDER — FOSFOMYCIN TROMETHAMINE 3 G PO PACK
3.0000 g | PACK | Freq: Once | ORAL | Status: AC
  Administered 2015-08-24: 3 g via ORAL
  Filled 2015-08-24: qty 3

## 2015-08-24 MED ORDER — WARFARIN SODIUM 2 MG PO TABS
4.0000 mg | ORAL_TABLET | Freq: Once | ORAL | Status: AC
  Administered 2015-08-24: 4 mg via ORAL
  Filled 2015-08-24: qty 2

## 2015-08-24 NOTE — Progress Notes (Signed)
Vascular lab called this writer notifying that the patient is  positive for DVT to bilateral lower extremities. MD notified. No new orders noted.

## 2015-08-24 NOTE — Progress Notes (Signed)
Held heparin for an hour per pharmacy and restarted at 8.5 mL/hr.

## 2015-08-24 NOTE — Progress Notes (Signed)
Preliminary report by tech - Venous Duplex Lower Ext. Completed. Right leg, positive for chronic appearing deep vein thrombosis involving the femoral vein, popliteal vein, posterior tibial and peroneal veins. Left Leg - positive for chronic appearing deep vein thrombosis in the proximal femoral vein only. Results given to patient' nurse. Tami Wood. RDMS, RVT

## 2015-08-24 NOTE — Progress Notes (Signed)
ANTICOAGULATION CONSULT NOTE - Follow-up Consult  Pharmacy Consult:  Heparin Indication:  New PE  Allergies  Allergen Reactions  . Penicillins Anaphylaxis    Has patient had a PCN reaction causing immediate rash, facial/tongue/throat swelling, SOB or lightheadedness with hypotension: Yes Has patient had a PCN reaction causing severe rash involving mucus membranes or skin necrosis: No Has patient had a PCN reaction that required hospitalization No Has patient had a PCN reaction occurring within the last 10 years: No If all of the above answers are "NO", then may proceed with Cephalosporin use.  Marland Kitchen Benicar [Olmesartan Medoxomil] Swelling    Lips swelling  . Ciprofloxacin Swelling  . Hctz [Hydrochlorothiazide] Swelling    Lips swelling  . Januvia [Sitagliptin Phosphate] Swelling    Lips swelling  . Norvasc [Amlodipine Besylate] Swelling    Lips swelling  . Ramipril Swelling    Lips swelling    Patient Measurements: Height: 5\' 5"  (165.1 cm) Weight: 165 lb 3.2 oz (74.934 kg) (Scale B) IBW/kg (Calculated) : 57 Heparin Dosing Weight:  72 kg  Vital Signs: Temp: 98.7 F (37.1 C) (12/14 1900) Temp Source: Oral (12/14 1900) BP: 126/70 mmHg (12/14 1900) Pulse Rate: 87 (12/14 1900)  Labs:  Recent Labs  08/22/15 1324  08/23/15 0006 08/23/15 1420 08/23/15 2230 08/23/15 2254  HGB 11.5*  --  10.4*  --   --   --   HCT 36.2  --  32.5*  --   --   --   PLT 190  --  189  --   --   --   HEPARINUNFRC  --   --   --   --  1.10*  --   CREATININE 2.58*  --  3.02*  --   --   --   TROPONINI  --   < > 0.11* 0.09*  --  0.08*  < > = values in this interval not displayed.  Estimated Creatinine Clearance: 13.3 mL/min (by C-G formula based on Cr of 3.02).  Assessment: 89 YOF on heparin for PE. Heparin level supratherapeutic (1.1) on 1050 units/hr. No bleeding noted. Appears that level drawn appropriately.  Goal of Therapy:  Heparin level 0.3-0.7 units/ml Monitor platelets by anticoagulation  protocol: Yes   Plan:  - Hold heparin x 1 hour - Restart heparin gtt at 850 units/hr - Check 8 hr HL  Sherlon Handing, PharmD, BCPS Clinical pharmacist, pager [phone] 08/24/2015, 12:10 AM

## 2015-08-24 NOTE — Progress Notes (Addendum)
ANTICOAGULATION CONSULT NOTE - Follow Up Consult  Pharmacy Consult for heparin/warfarin Indication: pulmonary embolus  Allergies  Allergen Reactions  . Penicillins Anaphylaxis    Has patient had a PCN reaction causing immediate rash, facial/tongue/throat swelling, SOB or lightheadedness with hypotension: Yes Has patient had a PCN reaction causing severe rash involving mucus membranes or skin necrosis: No Has patient had a PCN reaction that required hospitalization No Has patient had a PCN reaction occurring within the last 10 years: No If all of the above answers are "NO", then may proceed with Cephalosporin use.  Marland Kitchen Benicar [Olmesartan Medoxomil] Swelling    Lips swelling  . Ciprofloxacin Swelling  . Hctz [Hydrochlorothiazide] Swelling    Lips swelling  . Januvia [Sitagliptin Phosphate] Swelling    Lips swelling  . Norvasc [Amlodipine Besylate] Swelling    Lips swelling  . Ramipril Swelling    Lips swelling    Patient Measurements: Height: 5\' 5"  (165.1 cm) Weight: 166 lb 12.8 oz (75.66 kg) IBW/kg (Calculated) : 57 Heparin Dosing Weight: 72 kg  Vital Signs: Temp: 97.4 F (36.3 C) (12/15 0420) Temp Source: Oral (12/15 0420) BP: 115/75 mmHg (12/15 0744) Pulse Rate: 98 (12/15 0744)  Labs:  Recent Labs  08/22/15 1324  08/23/15 0006 08/23/15 1420 08/23/15 2230 08/23/15 2254 08/24/15 0126  HGB 11.5*  --  10.4*  --   --   --   --   HCT 36.2  --  32.5*  --   --   --   --   PLT 190  --  189  --   --   --   --   HEPARINUNFRC  --   --   --   --  1.10*  --   --   CREATININE 2.58*  --  3.02*  --   --   --   --   TROPONINI  --   < > 0.11* 0.09*  --  0.08* 0.08*  < > = values in this interval not displayed.  Estimated Creatinine Clearance: 13.4 mL/min (by C-G formula based on Cr of 3.02).   Assessment: 39 yoF admitted 08/22/2015 with shortness of breath and chest tightness. Patient does have history of GIB in the past. VQ scan showed high probability of PE. Pharmacy  consulted for heparin and warfarin. Initial heparin level was supratherapeutic at 1.1, RN instructed to hold heparin x1 hour and restart at 850 units/hr.   HL 0.53 (therapeutic), Baseline INR 1.37, H/H stable, Plt wnl, no s/sx of bleeding noted  Goal of Therapy:  INR 2-3 Heparin level 0.3-0.7 units/ml Monitor platelets by anticoagulation protocol: Yes   Plan:  - Continue heparin 850 units/hr - Give warfarin 4 mg x1 tonight - Monitor daily HL, INR, CBC and s/sx of bleeding   Dimitri Ped, PharmD. PGY-1 Pharmacy Resident Pager: [phone] 08/24/2015,10:12 AM

## 2015-08-24 NOTE — Discharge Instructions (Signed)

## 2015-08-24 NOTE — Progress Notes (Addendum)
[facility]                                                                              Patient Demographics  Julie Carpenter, is a 81 y.o. female, DOB - [birth year], [id number]  Admit date - 08/22/2015   Admitting Physician Waldemar Dickens, MD  Outpatient Primary MD for the patient is Chevis Pretty, [place]  LOS - 2   Chief Complaint  Patient presents with  . Shortness of Breath      HPI on 08/22/2015 by Ms. [name] NP/Dr. Linna Darner Julie Carpenter is a 81 y.o. female with multiple medical problems not limited to type 2 diabetes, chronic kidney disease and hypertension. Patient was seen by PCP 5 days ago with shortness of breath and elevated blood sugar. She had new T-wave inversions in anterior leads on EKG. Her weight was up with lower extremity swelling. We admitted the patient, felt dyspnea likely secondary to atrial fibrillation, not volume overload. BNP was in 700s but she had acute on chronic renal failure. . She had elevated troponin but this was felt to be secondary to demand ischemia secondary to tachycardia and uncontrolled blood pressure. She was treated for UTI as well as acute on chronic kidney disease during that admission.   Patient back in ED with dyspnea and chest tightness. BNP has doubled, Creatinine has continued to rise. Patient lives alone, fairly active but now finding it difficult to walk across the room. She cannot sleep without elevating her cell phone pillows and this has been going on for about 2 months. Lasix was decreased from 40-20 mg daily apparently 6 weeks ago. Patient was falling, felt to be secondary to hypotension.    Assessment & Plan   Acute respiratory failure with hypoxia/Dyspnea/Pulmonary Embolism -Likely multifactorial: ?PE vs CHF vs Afib -O2 sats were noted to be 86 upon admission -Patient was recently admitted and discharged from [facility] for exertional dyspnea -Echocardiogram from 08/18/2015 shows an EF  of 65-70%, unable to evaluate diastolic function -Patient does have atrial fibrillation however does seem to be rate controlled at this time -D-dimer was elevated at 5.98 -V/Q scan obtained, showing high probability for PE -Continue n heparin per pharmacy, will start coumadin -Pending lower extremity Dopplers to rule out DVT -Continue supplemental oxygen to maintain saturations above 92%  ?Acute diastolic dysfunction -Last echocardiogram could not evaluate diastolic function due to atrial fibrillation -Will continue to monitor intake and output, daily weight -BNP at admission 1382 -lasix held due to AKI  Mild elevation in troponin -Possibly secondary to the above -Will continue to cycle -Patient was evaluate by cardiology of her last admission and found to be not a candidate for invasive workup  Pyuria/UTI -UA on 08/18/2015 showed many bacteria, WBC 6-30, negative nitrites. -Repeat UA 08/23/2015: many bacteria, TNTC WBC, large leukocytes -Urine culture pending -Will give one dose of fosfomycin  Acute on chronic Chronic kidney disease, stage IV -Baseline creatinine appears to be between 1.7-2 -Currently 2.99 -Hold Lasix  Type 2 diabetes mellitus -continue insulin sliding scale a CBG monitoring -HbA1C 07/24/2015: 8.7  Atrial fibrillation -CHADSVASC 6 (age, gender, CHF, DM, HTN) -Patient not on anticoagulation due to  history of GI bleed -Spoke with daughter, patient was on xarelto in the past- developed GI bleed.  Given risks vs benefits, will continue with anticoagulation.   Hypertension -Stable, continue Coreg  Hyperlipidemia -Continue statin, fenofibrate  Chronic Normocytic anemia -Baseline Hb 10-11, currently 10., continue to monitor CBC closely given history of GI bleed and starting [facility]  Code Status: DNR  Family Communication: None at bedside, daughter via phone  Disposition Plan: Admitted.   Time Spent in minutes   30 minutes  Procedures  VQ scan  Consults    None  DVT Prophylaxis  Heparin/coumadin  Lab Results  Component Value Date   PLT 210 08/24/2015    Medications  Scheduled Meds: . amitriptyline  50 mg Oral QHS  . antiseptic oral rinse  7 mL Mouth Rinse BID  . aspirin EC  325 mg Oral Once  . atorvastatin  40 mg Oral q morning - 10a  . calcitRIOL  0.25 mcg Oral Once per day on Mon Wed Fri  . carvedilol  12.5 mg Oral BID WC  . fenofibrate  54 mg Oral Daily  . insulin aspart  0-9 Units Subcutaneous TID WC & HS   Continuous Infusions: . heparin 850 Units/hr (08/24/15 1101)   PRN Meds:.acetaminophen **OR** acetaminophen, HYDROcodone-acetaminophen, ipratropium-albuterol, morphine injection, nitroGLYCERIN, ondansetron **OR** ondansetron (ZOFRAN) IV, technetium TC 70M diethylenetriame-pentaacetic acid  Antibiotics    Anti-infectives    None     Subjective:   Julie Carpenter seen and examined today.  Patient continues to complain of some shortness of breath but feels better.  Denies chest pain, abdominal pain, dizziness, headache.   Objective:   Filed Vitals:   08/23/15 1806 08/23/15 1900 08/24/15 0420 08/24/15 0744  BP: 95/60 126/70 109/77 115/75  Pulse: 89 87 104 98  Temp:  98.7 F (37.1 C) 97.4 F (36.3 C)   TempSrc:  Oral Oral   Resp:  20 21 18   Height:      Weight:   75.66 kg (166 lb 12.8 oz)   SpO2:  99% 94% 96%    Wt Readings from Last 3 Encounters:  08/24/15 75.66 kg (166 lb 12.8 oz)  08/17/15 76.658 kg (169 lb)  08/17/15 76.658 kg (169 lb)     Intake/Output Summary (Last 24 hours) at 08/24/15 1122 Last data filed at 08/24/15 1108  Gross per 24 hour  Intake 1054.37 ml  Output    601 ml  Net 453.37 ml    Exam  General: Well developed, well nourished, NAD, appears stated age  34: NCAT, mucous membranes moist.   Cardiovascular: S1 S2 auscultated, Irregular  Respiratory: Clear to auscultation bilaterally   Abdomen: Soft, nontender, nondistended, + bowel sounds  Extremities: warm dry  without cyanosis clubbing. +1 edema in LE B/L  Neuro: AAOx3, nonfocal  Psych: Normal affect and demeanor   Data Review   Micro Results No results found for this or any previous visit (from the past 240 hour(s)).  Radiology Reports Dg Chest 2 View  08/22/2015  CLINICAL DATA:  Chest pressure for 1 week EXAM: CHEST  2 VIEW COMPARISON:  08/17/2015 FINDINGS: Cardiomediastinal silhouette is stable. Both lungs are clear. Stable osteopenia and mild degenerative changes thoracic spine. Stable compression deformity lower thoracic spine. IMPRESSION: No active cardiopulmonary disease.  No significant change. Electronically Signed   By: Lahoma Crocker M.D.   On: 08/22/2015 13:33   Dg Chest 2 View  08/17/2015  CLINICAL DATA:  Shortness of breath for 2 days. Abnormal EKG. Atrial  fibrillation. Diabetes and hypertension. EXAM: CHEST  2 VIEW COMPARISON:  None. FINDINGS: The heart size and mediastinal contours are within normal limits. Both lungs are clear. No evidence of pneumothorax or pleural effusion. Old lower thoracic vertebral body compression fracture deformity noted as well as mild thoracic spine degenerative changes. IMPRESSION: No active cardiopulmonary disease. Electronically Signed   By: Earle Gell M.D.   On: 08/17/2015 17:16   Nm Pulmonary Perf And Vent  08/23/2015  CLINICAL DATA:  Shortness of breath, wheezing, positive D-dimer EXAM: NUCLEAR MEDICINE VENTILATION - PERFUSION LUNG SCAN TECHNIQUE: Ventilation images were obtained in multiple projections using inhaled aerosol Tc-19m DTPA. Perfusion images were obtained in multiple projections after intravenous injection of Tc-61m MAA. RADIOPHARMACEUTICALS:  [id number] millicurie [id number] DTPA aerosol inhalation and 4.1 millicurie [id number] MAA IV COMPARISON:  Chest x-ray 08/22/2015 FINDINGS: Ventilation: No focal ventilation defect. Perfusion: There is decreased perfusion right upper lobe apical and right lower lobe laterally. The chest x-ray is  unremarkable. Findings are high probability for pulmonary embolus. IMPRESSION: No ventilation defects. There is decreased perfusion in right apical lung and right lower lobe laterally. No chest x-ray abnormality. Findings are high probability for pulmonary embolus. Electronically Signed   By: Lahoma Crocker M.D.   On: 08/23/2015 11:51    CBC  Recent Labs Lab 08/17/15 1637 08/22/15 1324 08/23/15 0006 08/24/15 1000  WBC 8.3 7.8 8.0 7.5  HGB 11.7* 11.5* 10.4* 10.3*  HCT 35.8* 36.2 32.5* 32.1*  PLT 244 190 189 210  MCV 86.5 85.4 85.8 86.5  MCH 28.3 27.1 27.4 27.8  MCHC 32.7 31.8 32.0 32.1  RDW 15.1 15.8* 15.8* 16.4*  LYMPHSABS 1.0  --   --   --   MONOABS 0.8  --   --   --   EOSABS 0.1  --   --   --   BASOSABS 0.0  --   --   --     Chemistries   Recent Labs Lab 08/17/15 1752 08/22/15 1324 08/23/15 0006 08/24/15 1000  NA 136 132* 133* 134*  K 4.0 4.7 4.3 4.5  CL 103 103 101 104  CO2 21* 19* 22 19*  GLUCOSE 305* 533* 306* 245*  BUN 30* 42* 45* 62*  CREATININE 2.12* 2.58* 3.02* 2.99*  CALCIUM 9.3 9.4 9.2 9.2  AST 33  --   --   --   ALT 21  --   --   --   ALKPHOS 109  --   --   --   BILITOT 0.7  --   --   --    ------------------------------------------------------------------------------------------------------------------ estimated creatinine clearance is 13.5 mL/min (by C-G formula based on Cr of 2.99). ------------------------------------------------------------------------------------------------------------------ No results for input(s): HGBA1C in the last 72 hours. ------------------------------------------------------------------------------------------------------------------ No results for input(s): CHOL, HDL, LDLCALC, TRIG, CHOLHDL, LDLDIRECT in the last 72 hours. ------------------------------------------------------------------------------------------------------------------ No results for input(s): TSH, T4TOTAL, T3FREE, THYROIDAB in the last 72 hours.  Invalid  input(s): FREET3 ------------------------------------------------------------------------------------------------------------------ No results for input(s): VITAMINB12, FOLATE, FERRITIN, TIBC, IRON, RETICCTPCT in the last 72 hours.  Coagulation profile No results for input(s): INR, PROTIME in the last 168 hours.   Recent Labs  08/22/15 1842  DDIMER 5.78*    Cardiac Enzymes  Recent Labs Lab 08/23/15 1420 08/23/15 2254 08/24/15 0126  TROPONINI 0.09* 0.08* 0.08*   ------------------------------------------------------------------------------------------------------------------ Invalid input(s): POCBNP    Bryor Rami D.O. on 08/24/2015 at 11:22 AM  Between 7am to 7pm - Pager - [phone]  After 7pm go to [email or web address] - password [facility]  And look for the night coverage person covering for me after hours  [facility] Office  [phone]

## 2015-08-25 LAB — GLUCOSE, CAPILLARY
GLUCOSE-CAPILLARY: 223 mg/dL — AB (ref 65–99)
GLUCOSE-CAPILLARY: 167 mg/dL — AB (ref 65–99)
GLUCOSE-CAPILLARY: 179 mg/dL — AB (ref 65–99)
Glucose-Capillary: 149 mg/dL — ABNORMAL HIGH (ref 65–99)

## 2015-08-25 LAB — BASIC METABOLIC PANEL
Anion gap: 9 (ref 5–15)
BUN: 65 mg/dL — AB (ref 6–20)
CHLORIDE: 102 mmol/L (ref 101–111)
CO2: 21 mmol/L — AB (ref 22–32)
CREATININE: 2.91 mg/dL — AB (ref 0.44–1.00)
Calcium: 8.9 mg/dL (ref 8.9–10.3)
GFR calc Af Amer: 16 mL/min — ABNORMAL LOW (ref >60)
GFR calc non Af Amer: 14 mL/min — ABNORMAL LOW (ref >60)
GLUCOSE: 168 mg/dL — AB (ref 65–99)
Potassium: 4.4 mmol/L (ref 3.5–5.1)
Sodium: 132 mmol/L — ABNORMAL LOW (ref 135–145)

## 2015-08-25 LAB — CBC
HEMATOCRIT: 31.9 % — AB (ref 36.0–46.0)
Hemoglobin: 10.4 g/dL — ABNORMAL LOW (ref 12.0–15.0)
MCH: 28.4 pg (ref 26.0–34.0)
MCHC: 32.6 g/dL (ref 30.0–36.0)
MCV: 87.2 fL (ref 78.0–100.0)
Platelets: 218 10*3/uL (ref 150–400)
RBC: 3.66 MIL/uL — ABNORMAL LOW (ref 3.87–5.11)
RDW: 16.6 % — AB (ref 11.5–15.5)
WBC: 7.4 10*3/uL (ref 4.0–10.5)

## 2015-08-25 LAB — PROTIME-INR
INR: 1.29 (ref 0.00–1.49)
Prothrombin Time: 16.3 seconds — ABNORMAL HIGH (ref 11.6–15.2)

## 2015-08-25 LAB — HEPARIN LEVEL (UNFRACTIONATED): Heparin Unfractionated: 0.66 IU/mL (ref 0.30–0.70)

## 2015-08-25 MED ORDER — FUROSEMIDE 20 MG PO TABS
20.0000 mg | ORAL_TABLET | Freq: Every day | ORAL | Status: DC
  Administered 2015-08-25 – 2015-08-29 (×5): 20 mg via ORAL
  Filled 2015-08-25 (×5): qty 1

## 2015-08-25 MED ORDER — WARFARIN SODIUM 5 MG PO TABS
5.0000 mg | ORAL_TABLET | Freq: Once | ORAL | Status: AC
  Administered 2015-08-25: 5 mg via ORAL
  Filled 2015-08-25: qty 1

## 2015-08-25 MED ORDER — INSULIN ASPART 100 UNIT/ML ~~LOC~~ SOLN
0.0000 [IU] | Freq: Three times a day (TID) | SUBCUTANEOUS | Status: DC
  Administered 2015-08-25 – 2015-08-27 (×3): 3 [IU] via SUBCUTANEOUS
  Administered 2015-08-27: 2 [IU] via SUBCUTANEOUS
  Administered 2015-08-28 (×2): 3 [IU] via SUBCUTANEOUS
  Administered 2015-08-29: 2 [IU] via SUBCUTANEOUS
  Administered 2015-08-29: 5 [IU] via SUBCUTANEOUS

## 2015-08-25 NOTE — Progress Notes (Signed)
[facility]                                                                              Patient Demographics  Julie Carpenter, is a 81 y.o. female, DOB - 03-06-1926, [id number]  Admit date - 08/22/2015   Admitting Physician Waldemar Dickens, MD  Outpatient Primary MD for the patient is Chevis Pretty, [place]  LOS - 3   Chief Complaint  Patient presents with  . Shortness of Breath      HPI on 08/22/2015 by Ms. [name] NP/Dr. Linna Darner Julie Carpenter is a 81 y.o. female with multiple medical problems not limited to type 2 diabetes, chronic kidney disease and hypertension. Patient was seen by PCP 5 days ago with shortness of breath and elevated blood sugar. She had new T-wave inversions in anterior leads on EKG. Her weight was up with lower extremity swelling. We admitted the patient, felt dyspnea likely secondary to atrial fibrillation, not volume overload. BNP was in 700s but she had acute on chronic renal failure. . She had elevated troponin but this was felt to be secondary to demand ischemia secondary to tachycardia and uncontrolled blood pressure. She was treated for UTI as well as acute on chronic kidney disease during that admission.   Patient back in ED with dyspnea and chest tightness. BNP has doubled, Creatinine has continued to rise. Patient lives alone, fairly active but now finding it difficult to walk across the room. She cannot sleep without elevating her cell phone pillows and this has been going on for about 2 months. Lasix was decreased from 40-20 mg daily apparently 6 weeks ago. Patient was falling, felt to be secondary to hypotension.    Assessment & Plan   Acute respiratory failure with hypoxia/Dyspnea/Pulmonary Embolism -Likely multifactorial: ?PE vs CHF vs Afib -O2 sats were noted to be 86 upon admission -Patient was recently admitted and discharged from [facility] for exertional dyspnea -Echocardiogram from 08/18/2015 shows an EF  of 65-70%, unable to evaluate diastolic function -Patient does have atrial fibrillation however does seem to be rate controlled at this time -D-dimer was elevated at 5.98 -V/Q scan obtained, showing high probability for PE -Continue heparin and coumadin per pharmacy -LE doppler: Chronic DVT in R femoral, popliteal, PT, peroneal veins. L chronic DVT  In proximal femoral -Continue supplemental oxygen to maintain saturations above 92%  ?Acute diastolic dysfunction -Last echocardiogram could not evaluate diastolic function due to atrial fibrillation -Will continue to monitor intake and output, daily weight -BNP at admission 1382 -lasix held due to AKI  Mild elevation in troponin -Possibly secondary to the above -Troponin cycled, fairly flat 0.08 -Patient was evaluated by cardiology of her last admission and found to be not a candidate for invasive workup  Pyuria/UTI -UA on 08/18/2015 showed many bacteria, WBC 6-30, negative nitrites. -Repeat UA 08/23/2015: many bacteria, TNTC WBC, large leukocytes -Urine culture pending -Dose of fosfomycin given  Acute on chronic Chronic kidney disease, stage IV -Baseline creatinine appears to be between 1.7-2 -Currently 2.99 -Hold Lasix  Type 2 diabetes mellitus -continue insulin sliding scale a CBG monitoring -HbA1C 07/24/2015: 8.7  Atrial fibrillation -CHADSVASC 6 (age, gender, CHF, DM,  HTN) -Patient not on anticoagulation due to history of GI bleed -Spoke with daughter, patient was on xarelto in the past- developed GI bleed.  Given risks vs benefits, will continue with anticoagulation.  -Started on coumadin  Hypertension -Stable, continue Coreg  Hyperlipidemia -Continue statin, fenofibrate  Chronic Normocytic anemia -Baseline Hb 10-11, currently 10.4, continue to monitor CBC closely given history of GI bleed and starting [facility]  Physical deconditioning -PT and OT to evaluate  Code Status: DNR  Family Communication: None at bedside,  daughter via phone  Disposition Plan: Admitted.   Time Spent in minutes   30 minutes  Procedures  VQ scan LE doppler  Consults   None  DVT Prophylaxis  Heparin/coumadin  Lab Results  Component Value Date   PLT 218 08/25/2015    Medications  Scheduled Meds: . amitriptyline  50 mg Oral QHS  . antiseptic oral rinse  7 mL Mouth Rinse BID  . aspirin EC  325 mg Oral Once  . atorvastatin  40 mg Oral q morning - 10a  . calcitRIOL  0.25 mcg Oral Once per day on Mon Wed Fri  . carvedilol  12.5 mg Oral BID WC  . fenofibrate  54 mg Oral Daily  . insulin aspart  0-9 Units Subcutaneous TID WC & HS  . warfarin  5 mg Oral ONCE-1800  . Warfarin - Pharmacist Dosing Inpatient   Does not apply q1800   Continuous Infusions: . heparin 850 Units/hr (08/24/15 1101)   PRN Meds:.acetaminophen **OR** acetaminophen, HYDROcodone-acetaminophen, ipratropium-albuterol, morphine injection, nitroGLYCERIN, ondansetron **OR** ondansetron (ZOFRAN) IV, technetium TC 67M diethylenetriame-pentaacetic acid  Antibiotics    Anti-infectives    None     Subjective:   Geoffery Lyons seen and examined today.  Patient states she is feeling better.  Denies chest pain, abdominal pain, dizziness, headache.   Objective:   Filed Vitals:   08/24/15 1806 08/24/15 1952 08/25/15 0107 08/25/15 0540  BP: 111/71 110/69 104/65 124/94  Pulse: 86 89 82 89  Temp:  97.3 F (36.3 C) 97.5 F (36.4 C) 97.4 F (36.3 C)  TempSrc:  Oral Oral Oral  Resp:  20 20 20   Height:      Weight:    76.431 kg (168 lb 8 oz)  SpO2: 100% 100% 100% 100%    Wt Readings from Last 3 Encounters:  08/25/15 76.431 kg (168 lb 8 oz)  08/17/15 76.658 kg (169 lb)  08/17/15 76.658 kg (169 lb)     Intake/Output Summary (Last 24 hours) at 08/25/15 1217 Last data filed at 08/25/15 1101  Gross per 24 hour  Intake 934.49 ml  Output    725 ml  Net 209.49 ml    Exam  General: Well developed, well nourished, NAD  HEENT: NCAT, mucous  membranes moist.   Cardiovascular: S1 S2 auscultated, Irregular  Respiratory: Clear to auscultation bilaterally   Abdomen: Soft, nontender, nondistended, + bowel sounds  Extremities: warm dry without cyanosis clubbing. +1 edema in LE B/L  Neuro: AAOx3, nonfocal  Psych: Normal affect and demeanor, pleasant   Data Review   Micro Results Recent Results (from the past 240 hour(s))  Culture, Urine     Status: None (Preliminary result)   Collection Time: 08/24/15  4:59 PM  Result Value Ref Range Status   Specimen Description URINE, RANDOM  Final   Special Requests NONE  Final   Culture TOO YOUNG TO READ  Final   Report Status PENDING  Incomplete    Radiology Reports Dg Chest 2  View  08/22/2015  CLINICAL DATA:  Chest pressure for 1 week EXAM: CHEST  2 VIEW COMPARISON:  08/17/2015 FINDINGS: Cardiomediastinal silhouette is stable. Both lungs are clear. Stable osteopenia and mild degenerative changes thoracic spine. Stable compression deformity lower thoracic spine. IMPRESSION: No active cardiopulmonary disease.  No significant change. Electronically Signed   By: Lahoma Crocker M.D.   On: 08/22/2015 13:33   Dg Chest 2 View  08/17/2015  CLINICAL DATA:  Shortness of breath for 2 days. Abnormal EKG. Atrial fibrillation. Diabetes and hypertension. EXAM: CHEST  2 VIEW COMPARISON:  None. FINDINGS: The heart size and mediastinal contours are within normal limits. Both lungs are clear. No evidence of pneumothorax or pleural effusion. Old lower thoracic vertebral body compression fracture deformity noted as well as mild thoracic spine degenerative changes. IMPRESSION: No active cardiopulmonary disease. Electronically Signed   By: Earle Gell M.D.   On: 08/17/2015 17:16   Nm Pulmonary Perf And Vent  08/23/2015  CLINICAL DATA:  Shortness of breath, wheezing, positive D-dimer EXAM: NUCLEAR MEDICINE VENTILATION - PERFUSION LUNG SCAN TECHNIQUE: Ventilation images were obtained in multiple projections using  inhaled aerosol Tc-57m DTPA. Perfusion images were obtained in multiple projections after intravenous injection of Tc-10m MAA. RADIOPHARMACEUTICALS:  [id number] millicurie [id number] DTPA aerosol inhalation and 4.1 millicurie [id number] MAA IV COMPARISON:  Chest x-ray 08/22/2015 FINDINGS: Ventilation: No focal ventilation defect. Perfusion: There is decreased perfusion right upper lobe apical and right lower lobe laterally. The chest x-ray is unremarkable. Findings are high probability for pulmonary embolus. IMPRESSION: No ventilation defects. There is decreased perfusion in right apical lung and right lower lobe laterally. No chest x-ray abnormality. Findings are high probability for pulmonary embolus. Electronically Signed   By: Lahoma Crocker M.D.   On: 08/23/2015 11:51    CBC  Recent Labs Lab 08/22/15 1324 08/23/15 0006 08/24/15 1000 08/25/15 0620  WBC 7.8 8.0 7.5 7.4  HGB 11.5* 10.4* 10.3* 10.4*  HCT 36.2 32.5* 32.1* 31.9*  PLT 190 189 210 218  MCV 85.4 85.8 86.5 87.2  MCH 27.1 27.4 27.8 28.4  MCHC 31.8 32.0 32.1 32.6  RDW 15.8* 15.8* 16.4* 16.6*    Chemistries   Recent Labs Lab 08/22/15 1324 08/23/15 0006 08/24/15 1000 08/25/15 0620  NA 132* 133* 134* 132*  K 4.7 4.3 4.5 4.4  CL 103 101 104 102  CO2 19* 22 19* 21*  GLUCOSE 533* 306* 245* 168*  BUN 42* 45* 62* 65*  CREATININE 2.58* 3.02* 2.99* 2.91*  CALCIUM 9.4 9.2 9.2 8.9   ------------------------------------------------------------------------------------------------------------------ estimated creatinine clearance is 13.9 mL/min (by C-G formula based on Cr of 2.91). ------------------------------------------------------------------------------------------------------------------ No results for input(s): HGBA1C in the last 72 hours. ------------------------------------------------------------------------------------------------------------------ No results for input(s): CHOL, HDL, LDLCALC, TRIG, CHOLHDL, LDLDIRECT in  the last 72 hours. ------------------------------------------------------------------------------------------------------------------ No results for input(s): TSH, T4TOTAL, T3FREE, THYROIDAB in the last 72 hours.  Invalid input(s): FREET3 ------------------------------------------------------------------------------------------------------------------ No results for input(s): VITAMINB12, FOLATE, FERRITIN, TIBC, IRON, RETICCTPCT in the last 72 hours.  Coagulation profile  Recent Labs Lab 08/24/15 1130 08/25/15 0620  INR 1.37 1.29     Recent Labs  08/22/15 1842  DDIMER 5.78*    Cardiac Enzymes  Recent Labs Lab 08/23/15 1420 08/23/15 2254 08/24/15 0126  TROPONINI 0.09* 0.08* 0.08*   ------------------------------------------------------------------------------------------------------------------ Invalid input(s): POCBNP    Nava Song D.O. on 08/25/2015 at 12:17 PM  Between 7am to 7pm - Pager - [phone]  After 7pm go to [email or web address] - password [email or web address]  And look for the night  coverage person covering for me after hours  [facility] Office  [phone]  

## 2015-08-25 NOTE — Progress Notes (Signed)
ANTICOAGULATION CONSULT NOTE - Follow Up Consult  Pharmacy Consult for heparin/warfarin Indication: pulmonary embolus  Allergies  Allergen Reactions  . Penicillins Anaphylaxis    Has patient had a PCN reaction causing immediate rash, facial/tongue/throat swelling, SOB or lightheadedness with hypotension: Yes Has patient had a PCN reaction causing severe rash involving mucus membranes or skin necrosis: No Has patient had a PCN reaction that required hospitalization No Has patient had a PCN reaction occurring within the last 10 years: No If all of the above answers are "NO", then may proceed with Cephalosporin use.  Marland Kitchen Benicar [Olmesartan Medoxomil] Swelling    Lips swelling  . Ciprofloxacin Swelling  . Hctz [Hydrochlorothiazide] Swelling    Lips swelling  . Januvia [Sitagliptin Phosphate] Swelling    Lips swelling  . Norvasc [Amlodipine Besylate] Swelling    Lips swelling  . Ramipril Swelling    Lips swelling    Patient Measurements: Height: 5\' 5"  (165.1 cm) Weight: 168 lb 8 oz (76.431 kg) (scale b) IBW/kg (Calculated) : 57 Heparin Dosing Weight: 72 kg  Vital Signs: Temp: 97.4 F (36.3 C) (12/16 0540) Temp Source: Oral (12/16 0540) BP: 124/94 mmHg (12/16 0540) Pulse Rate: 89 (12/16 0540)  Labs:  Recent Labs  08/23/15 0006 08/23/15 1420 08/23/15 2230 08/23/15 2254 08/24/15 0126 08/24/15 1000 08/24/15 1130 08/25/15 0620  HGB 10.4*  --   --   --   --  10.3*  --  10.4*  HCT 32.5*  --   --   --   --  32.1*  --  31.9*  PLT 189  --   --   --   --  210  --  218  LABPROT  --   --   --   --   --   --  17.0* 16.3*  INR  --   --   --   --   --   --  1.37 1.29  HEPARINUNFRC  --   --  1.10*  --   --   --  0.53 0.66  CREATININE 3.02*  --   --   --   --  2.99*  --  2.91*  TROPONINI 0.11* 0.09*  --  0.08* 0.08*  --   --   --     Estimated Creatinine Clearance: 13.9 mL/min (by C-G formula based on Cr of 2.91).   Assessment: 79 yoF admitted 08/22/2015 with shortness of  breath and chest tightness. Patient does have history of GIB in the past on Xarelto. VQ scan showed high probability of PE. Pharmacy consulted for heparin and warfarin.    HL 0.66 (therapeutic), current INR 1.29 Baseline INR 1.37, H/H stable, Plt wnl, no s/sx of bleeding noted  Goal of Therapy:  INR 2-3 Heparin level 0.3-0.7 units/ml Monitor platelets by anticoagulation protocol: Yes   Plan:  - Continue heparin 850 units/hr - Give warfarin 5 mg x1 tonight - Monitor daily HL, INR, CBC and s/sx of bleeding   Angela Burke, PharmD Pharmacy Resident Pager: [phone]  08/25/2015,8:28 AM

## 2015-08-25 NOTE — Progress Notes (Signed)
Inpatient Diabetes Program Recommendations  AACE/ADA: New Consensus Statement on Inpatient Glycemic Control (2015)  Target Ranges:  Prepandial:   less than 140 mg/dL      Peak postprandial:   less than 180 mg/dL (1-2 hours)      Critically ill patients:  140 - 180 mg/dL  Results for Julie Carpenter, Julie Carpenter (MRN [id number]) as of 08/25/2015 11:40  Ref. Range 08/24/2015 06:04 08/24/2015 11:38 08/24/2015 18:15 08/24/2015 23:14 08/25/2015 06:25  Glucose-Capillary Latest Ref Range: 65-99 mg/dL 191 (H) 210 (H) 243 (H) 201 (H) 149 (H)   Review of Glycemic Control  Current orders for Inpatient glycemic control: Novolog 0-9 units ACHS  Inpatient Diabetes Program Recommendations: Correction (SSI): Please consider increasing Novolog correction to moderate scale.  Thanks, Barnie Alderman, RN, MSN, CDE Diabetes Coordinator Inpatient Diabetes Program [phone] (Team Pager from [place] to [place]) [phone] ([place] office) [phone] [facility] office) [phone] [facility] office)

## 2015-08-25 NOTE — Care Management Important Message (Signed)
Important Message  Patient Details  Name: Julie Carpenter MRN: [id number] Date of Birth: May 03, 1926   Medicare Important Message Given:  Yes    [name] P Fujiko Picazo 08/25/2015, 2:21 PM

## 2015-08-26 LAB — URINE CULTURE: Culture: >=100000

## 2015-08-26 LAB — CBC
HEMATOCRIT: 31.2 % — AB (ref 36.0–46.0)
Hemoglobin: 10.2 g/dL — ABNORMAL LOW (ref 12.0–15.0)
MCH: 28.4 pg (ref 26.0–34.0)
MCHC: 32.7 g/dL (ref 30.0–36.0)
MCV: 86.9 fL (ref 78.0–100.0)
PLATELETS: 173 10*3/uL (ref 150–400)
RBC: 3.59 MIL/uL — ABNORMAL LOW (ref 3.87–5.11)
RDW: 16.8 % — AB (ref 11.5–15.5)
WBC: 8.2 10*3/uL (ref 4.0–10.5)

## 2015-08-26 LAB — BASIC METABOLIC PANEL
Anion gap: 9 (ref 5–15)
BUN: 65 mg/dL — AB (ref 6–20)
CALCIUM: 8.8 mg/dL — AB (ref 8.9–10.3)
CHLORIDE: 107 mmol/L (ref 101–111)
CO2: 19 mmol/L — ABNORMAL LOW (ref 22–32)
CREATININE: 2.81 mg/dL — AB (ref 0.44–1.00)
GFR calc Af Amer: 16 mL/min — ABNORMAL LOW (ref >60)
GFR calc non Af Amer: 14 mL/min — ABNORMAL LOW (ref >60)
Glucose, Bld: 112 mg/dL — ABNORMAL HIGH (ref 65–99)
Potassium: 4.5 mmol/L (ref 3.5–5.1)
SODIUM: 135 mmol/L (ref 135–145)

## 2015-08-26 LAB — GLUCOSE, CAPILLARY
GLUCOSE-CAPILLARY: 158 mg/dL — AB (ref 65–99)
GLUCOSE-CAPILLARY: 102 mg/dL — AB (ref 65–99)
Glucose-Capillary: 108 mg/dL — ABNORMAL HIGH (ref 65–99)
Glucose-Capillary: 103 mg/dL — ABNORMAL HIGH (ref 65–99)

## 2015-08-26 LAB — HEPARIN LEVEL (UNFRACTIONATED): Heparin Unfractionated: 0.56 IU/mL (ref 0.30–0.70)

## 2015-08-26 LAB — PROTIME-INR
INR: 1.36 (ref 0.00–1.49)
PROTHROMBIN TIME: 16.9 s — AB (ref 11.6–15.2)

## 2015-08-26 MED ORDER — WARFARIN SODIUM 7.5 MG PO TABS
7.5000 mg | ORAL_TABLET | Freq: Once | ORAL | Status: AC
  Administered 2015-08-26: 7.5 mg via ORAL
  Filled 2015-08-26: qty 1

## 2015-08-26 NOTE — Progress Notes (Signed)
[facility]                                                                              Patient Demographics  Julie Carpenter, is a 81 y.o. female, DOB - [birth year], [id number]  Admit date - 08/22/2015   Admitting Physician Waldemar Dickens, MD  Outpatient Primary MD for the patient is Chevis Pretty, Hailey  LOS - 4   Chief Complaint  Patient presents with  . Shortness of Breath      HPI on 08/22/2015 by Ms. [name] NP/Dr. Linna Darner Julie Carpenter is a 81 y.o. female with multiple medical problems not limited to type 2 diabetes, chronic kidney disease and hypertension. Patient was seen by PCP 5 days ago with shortness of breath and elevated blood sugar. She had new T-wave inversions in anterior leads on EKG. Her weight was up with lower extremity swelling. We admitted the patient, felt dyspnea likely secondary to atrial fibrillation, not volume overload. BNP was in 700s but she had acute on chronic renal failure. . She had elevated troponin but this was felt to be secondary to demand ischemia secondary to tachycardia and uncontrolled blood pressure. She was treated for UTI as well as acute on chronic kidney disease during that admission.   Patient back in ED with dyspnea and chest tightness. BNP has doubled, Creatinine has continued to rise. Patient lives alone, fairly active but now finding it difficult to walk across the room. She cannot sleep without elevating her cell phone pillows and this has been going on for about 2 months. Lasix was decreased from 40-20 mg daily apparently 6 weeks ago. Patient was falling, felt to be secondary to hypotension.    Assessment & Plan   Acute respiratory failure with hypoxia/Dyspnea/Pulmonary Embolism -Likely multifactorial: ?PE vs CHF vs Afib -O2 sats were noted to be 86 upon admission -Patient was recently admitted and discharged from [facility] for exertional dyspnea -Echocardiogram from 08/18/2015 shows an EF  of 65-70%, unable to evaluate diastolic function -Patient does have atrial fibrillation however does seem to be rate controlled at this time -D-dimer was elevated at 5.98 -V/Q scan obtained, showing high probability for PE -Continue heparin and coumadin per pharmacy -LE doppler: Chronic DVT in R femoral, popliteal, PT, peroneal veins. L chronic DVT  In proximal femoral -Continue supplemental oxygen to maintain saturations above 92%  ?Acute diastolic dysfunction -Last echocardiogram could not evaluate diastolic function due to atrial fibrillation -Will continue to monitor intake and output, daily weight -BNP at admission 1382 -lasix held due to AKI  Mild elevation in troponin -Possibly secondary to the above -Troponin cycled, fairly flat 0.08 -Patient was evaluated by cardiology of her last admission and found to be not a candidate for invasive workup  Pyuria/UTI -UA on 08/18/2015 showed many bacteria, WBC 6-30, negative nitrites. -Repeat UA 08/23/2015: many bacteria, TNTC WBC, large leukocytes -Urine culture pending -Dose of fosfomycin given  Acute on chronic Chronic kidney disease, stage IV -Baseline creatinine appears to be between 1.7-2 -Currently 2.81 -Hold Lasix -Spoke with Dr. Mercy Moore, patient is not likely a candidate for HD  Type 2 diabetes mellitus -continue insulin sliding scale a CBG monitoring -  HbA1C 07/24/2015: 8.7  Atrial fibrillation -CHADSVASC 6 (age, gender, CHF, DM, HTN) -Patient not on anticoagulation due to history of GI bleed -Spoke with daughter, patient was on xarelto in the past- developed GI bleed.  Given risks vs benefits, will continue with anticoagulation.  -Continue coumadin, INR 1.36  Hypertension -Stable, continue Coreg  Hyperlipidemia -Continue statin, fenofibrate  Chronic Normocytic anemia -Baseline Hb 10-11, currently 10.2, continue to monitor CBC closely given history of GI bleed and starting [facility]  Physical deconditioning -PT and  OT to evaluate  Code Status: DNR  Family Communication: None at bedside, daughter via phone  Disposition Plan: Admitted.   Time Spent in minutes   30 minutes  Procedures  VQ scan LE doppler  Consults   None  DVT Prophylaxis  Heparin/coumadin  Lab Results  Component Value Date   PLT 173 08/26/2015    Medications  Scheduled Meds: . amitriptyline  50 mg Oral QHS  . antiseptic oral rinse  7 mL Mouth Rinse BID  . aspirin EC  325 mg Oral Once  . atorvastatin  40 mg Oral q morning - 10a  . calcitRIOL  0.25 mcg Oral Once per day on Mon Wed Fri  . carvedilol  12.5 mg Oral BID WC  . fenofibrate  54 mg Oral Daily  . furosemide  20 mg Oral Daily  . insulin aspart  0-15 Units Subcutaneous TID WC  . Warfarin - Pharmacist Dosing Inpatient   Does not apply q1800   Continuous Infusions: . heparin 850 Units/hr (08/25/15 1855)   PRN Meds:.acetaminophen **OR** acetaminophen, HYDROcodone-acetaminophen, ipratropium-albuterol, morphine injection, nitroGLYCERIN, ondansetron **OR** ondansetron (ZOFRAN) IV, technetium TC 79M diethylenetriame-pentaacetic acid  Antibiotics    Anti-infectives    None     Subjective:   Julie Carpenter seen and examined today.  Patient states she is feeling better and has no complaints today.  Denies chest pain, abdominal pain, dizziness, headache.   Objective:   Filed Vitals:   08/25/15 1241 08/25/15 1711 08/25/15 2023 08/26/15 0517  BP: 96/62 117/69 98/68 114/76  Pulse: 95 89 87 91  Temp:   97.9 F (36.6 C) 97.7 F (36.5 C)  TempSrc:   Oral Oral  Resp: 20  18 18   Height:      Weight:    77.474 kg (170 lb 12.8 oz)  SpO2: 99%  98% 95%    Wt Readings from Last 3 Encounters:  08/26/15 77.474 kg (170 lb 12.8 oz)  08/17/15 76.658 kg (169 lb)  08/17/15 76.658 kg (169 lb)     Intake/Output Summary (Last 24 hours) at 08/26/15 1205 Last data filed at 08/26/15 0836  Gross per 24 hour  Intake    780 ml  Output   1150 ml  Net   -370 ml    Exam  (no change from previous day)  General: Well developed, well nourished, NAD  HEENT: NCAT, mucous membranes moist.   Cardiovascular: S1 S2 auscultated, Irregular  Respiratory: Clear to auscultation bilaterally   Abdomen: Soft, nontender, nondistended, + bowel sounds  Extremities: warm dry without cyanosis clubbing. +1 edema in LE B/L  Neuro: AAOx3, nonfocal  Psych: Normal affect and demeanor, pleasant   Data Review   Micro Results Recent Results (from the past 240 hour(s))  Culture, Urine     Status: None   Collection Time: 08/24/15  4:59 PM  Result Value Ref Range Status   Specimen Description URINE, RANDOM  Final   Special Requests NONE  Final   Culture >=100,000  COLONIES/mL KLEBSIELLA PNEUMONIAE  Final   Report Status 08/26/2015 FINAL  Final   Organism ID, Bacteria KLEBSIELLA PNEUMONIAE  Final      Susceptibility   Klebsiella pneumoniae - MIC*    AMPICILLIN >=32 RESISTANT Resistant     CEFAZOLIN <=4 SENSITIVE Sensitive     CEFTRIAXONE <=1 SENSITIVE Sensitive     CIPROFLOXACIN <=0.25 SENSITIVE Sensitive     GENTAMICIN <=1 SENSITIVE Sensitive     IMIPENEM <=0.25 SENSITIVE Sensitive     NITROFURANTOIN 32 SENSITIVE Sensitive     TRIMETH/SULFA <=20 SENSITIVE Sensitive     AMPICILLIN/SULBACTAM 4 SENSITIVE Sensitive     PIP/TAZO <=4 SENSITIVE Sensitive     * >=100,000 COLONIES/mL KLEBSIELLA PNEUMONIAE    Radiology Reports Dg Chest 2 View  08/22/2015  CLINICAL DATA:  Chest pressure for 1 week EXAM: CHEST  2 VIEW COMPARISON:  08/17/2015 FINDINGS: Cardiomediastinal silhouette is stable. Both lungs are clear. Stable osteopenia and mild degenerative changes thoracic spine. Stable compression deformity lower thoracic spine. IMPRESSION: No active cardiopulmonary disease.  No significant change. Electronically Signed   By: Lahoma Crocker M.D.   On: 08/22/2015 13:33   Dg Chest 2 View  08/17/2015  CLINICAL DATA:  Shortness of breath for 2 days. Abnormal EKG. Atrial fibrillation.  Diabetes and hypertension. EXAM: CHEST  2 VIEW COMPARISON:  None. FINDINGS: The heart size and mediastinal contours are within normal limits. Both lungs are clear. No evidence of pneumothorax or pleural effusion. Old lower thoracic vertebral body compression fracture deformity noted as well as mild thoracic spine degenerative changes. IMPRESSION: No active cardiopulmonary disease. Electronically Signed   By: Earle Gell M.D.   On: 08/17/2015 17:16   Nm Pulmonary Perf And Vent  08/23/2015  CLINICAL DATA:  Shortness of breath, wheezing, positive D-dimer EXAM: NUCLEAR MEDICINE VENTILATION - PERFUSION LUNG SCAN TECHNIQUE: Ventilation images were obtained in multiple projections using inhaled aerosol Tc-69m DTPA. Perfusion images were obtained in multiple projections after intravenous injection of Tc-68m MAA. RADIOPHARMACEUTICALS:  [id number] millicurie [id number] DTPA aerosol inhalation and 4.1 millicurie [id number] MAA IV COMPARISON:  Chest x-ray 08/22/2015 FINDINGS: Ventilation: No focal ventilation defect. Perfusion: There is decreased perfusion right upper lobe apical and right lower lobe laterally. The chest x-ray is unremarkable. Findings are high probability for pulmonary embolus. IMPRESSION: No ventilation defects. There is decreased perfusion in right apical lung and right lower lobe laterally. No chest x-ray abnormality. Findings are high probability for pulmonary embolus. Electronically Signed   By: Lahoma Crocker M.D.   On: 08/23/2015 11:51    CBC  Recent Labs Lab 08/22/15 1324 08/23/15 0006 08/24/15 1000 08/25/15 0620 08/26/15 0400  WBC 7.8 8.0 7.5 7.4 8.2  HGB 11.5* 10.4* 10.3* 10.4* 10.2*  HCT 36.2 32.5* 32.1* 31.9* 31.2*  PLT 190 189 210 218 173  MCV 85.4 85.8 86.5 87.2 86.9  MCH 27.1 27.4 27.8 28.4 28.4  MCHC 31.8 32.0 32.1 32.6 32.7  RDW 15.8* 15.8* 16.4* 16.6* 16.8*    Chemistries   Recent Labs Lab 08/22/15 1324 08/23/15 0006 08/24/15 1000 08/25/15 0620 08/26/15 0400    NA 132* 133* 134* 132* 135  K 4.7 4.3 4.5 4.4 4.5  CL 103 101 104 102 107  CO2 19* 22 19* 21* 19*  GLUCOSE 533* 306* 245* 168* 112*  BUN 42* 45* 62* 65* 65*  CREATININE 2.58* 3.02* 2.99* 2.91* 2.81*  CALCIUM 9.4 9.2 9.2 8.9 8.8*   ------------------------------------------------------------------------------------------------------------------ estimated creatinine clearance is 14.5 mL/min (by C-G formula based on Cr  of 2.81). ------------------------------------------------------------------------------------------------------------------ No results for input(s): HGBA1C in the last 72 hours. ------------------------------------------------------------------------------------------------------------------ No results for input(s): CHOL, HDL, LDLCALC, TRIG, CHOLHDL, LDLDIRECT in the last 72 hours. ------------------------------------------------------------------------------------------------------------------ No results for input(s): TSH, T4TOTAL, T3FREE, THYROIDAB in the last 72 hours.  Invalid input(s): FREET3 ------------------------------------------------------------------------------------------------------------------ No results for input(s): VITAMINB12, FOLATE, FERRITIN, TIBC, IRON, RETICCTPCT in the last 72 hours.  Coagulation profile  Recent Labs Lab 08/24/15 1130 08/25/15 0620 08/26/15 0400  INR 1.37 1.29 1.36    No results for input(s): DDIMER in the last 72 hours.  Cardiac Enzymes  Recent Labs Lab 08/23/15 1420 08/23/15 2254 08/24/15 0126  TROPONINI 0.09* 0.08* 0.08*   ------------------------------------------------------------------------------------------------------------------ Invalid input(s): POCBNP    Laure Leone D.O. on 08/26/2015 at 12:05 PM  Between 7am to 7pm - Pager - [phone]  After 7pm go to [email or web address] - password [email or web address]  And look for the night coverage person covering for me after hours  [facility] Office   [phone]

## 2015-08-26 NOTE — Progress Notes (Addendum)
ANTICOAGULATION CONSULT NOTE - Follow Up Consult  Pharmacy Consult for heparin/warfarin Indication: pulmonary embolus  Allergies  Allergen Reactions  . Penicillins Anaphylaxis    Has patient had a PCN reaction causing immediate rash, facial/tongue/throat swelling, SOB or lightheadedness with hypotension: Yes Has patient had a PCN reaction causing severe rash involving mucus membranes or skin necrosis: No Has patient had a PCN reaction that required hospitalization No Has patient had a PCN reaction occurring within the last 10 years: No If all of the above answers are "NO", then may proceed with Cephalosporin use.  Marland Kitchen Benicar [Olmesartan Medoxomil] Swelling    Lips swelling  . Ciprofloxacin Swelling  . Hctz [Hydrochlorothiazide] Swelling    Lips swelling  . Januvia [Sitagliptin Phosphate] Swelling    Lips swelling  . Norvasc [Amlodipine Besylate] Swelling    Lips swelling  . Ramipril Swelling    Lips swelling    Patient Measurements: Height: 5\' 5"  (165.1 cm) Weight: 170 lb 12.8 oz (77.474 kg) IBW/kg (Calculated) : 57 Heparin Dosing Weight: 72 kg  Vital Signs: Temp: 97.7 F (36.5 C) (12/17 0517) Temp Source: Oral (12/17 0517) BP: 114/76 mmHg (12/17 0517) Pulse Rate: 91 (12/17 0517)  Labs:  Recent Labs  08/23/15 1420  08/23/15 2254 08/24/15 0126  08/24/15 1000 08/24/15 1130 08/25/15 0620 08/26/15 0400  HGB  --   --   --   --   < > 10.3*  --  10.4* 10.2*  HCT  --   --   --   --   --  32.1*  --  31.9* 31.2*  PLT  --   --   --   --   --  210  --  218 173  LABPROT  --   --   --   --   --   --  17.0* 16.3* 16.9*  INR  --   --   --   --   --   --  1.37 1.29 1.36  HEPARINUNFRC  --   < >  --   --   --   --  0.53 0.66 0.56  CREATININE  --   --   --   --   --  2.99*  --  2.91* 2.81*  TROPONINI 0.09*  --  0.08* 0.08*  --   --   --   --   --   < > = values in this interval not displayed.  Estimated Creatinine Clearance: 14.5 mL/min (by C-G formula based on Cr of  2.81).   Assessment: 4 yoF admitted 08/22/2015 with shortness of breath and chest tightness. Patient does have history of GIB in the past on Xarelto. VQ scan showed high probability of PE. Pharmacy consulted for heparin and warfarin.    HL 0.56 (therapeutic), current INR 1.36 Baseline INR 1.37, H/H stable, Plt wnl  Goal of Therapy:  INR 2-3 Heparin level 0.3-0.7 units/ml Monitor platelets by anticoagulation protocol: Yes   Plan:  - Continue heparin 850 units/hr - Give warfarin 7.5 mg x1 tonight -Consider discontinuing aspirin or decrease to 81mg /day - Monitor daily HL, INR, CBC and s/sx of bleeding  Hildred Laser, Pharm D 08/26/2015 12:05 PM

## 2015-08-26 NOTE — Evaluation (Signed)
Physical Therapy Evaluation Patient Details Name: Julie Carpenter MRN: [id number] DOB: [birth year] Today's Date: 08/26/2015   History of Present Illness  81 yo female with onset of SOB and pyuria was found to have elevated troponin from demand of PE and UTI, CKD 3.    Clinical Impression  Pt was up to walk with PT and noted her O2 sats held on with O2 via nasal cannula 2L per min.  Her plan is to go home with RW and HHPT to follow up as she is able to walk and move fairly well.  Will need to try without O2 when able as she was not on supplemental O2 prior to this hospitalization.    Follow Up Recommendations Home health PT;Supervision - Intermittent    Equipment Recommendations  Rolling walker with 5" wheels    Recommendations for Other Services       Precautions / Restrictions Precautions Precautions: Fall Restrictions Weight Bearing Restrictions: No      Mobility  Bed Mobility Overal bed mobility: Needs Assistance Bed Mobility: Supine to Sit;Sit to Supine     Supine to sit: Min guard Sit to supine: Min guard   General bed mobility comments: HOB slightly elevated but can even move bedcovers.  Transfers Overall transfer level: Modified independent Equipment used: Rolling walker (2 wheeled);1 person hand held assist             General transfer comment: correct hand placement with cues  Ambulation/Gait Ambulation/Gait assistance: Min guard Ambulation Distance (Feet): 80 Feet (20 x 4) Assistive device: Rolling walker (2 wheeled);1 person hand held assist Gait Pattern/deviations: Step-through pattern;Decreased stride length;Wide base of support Gait velocity: slow Gait velocity interpretation: Below normal speed for age/gender General Gait Details: walks short trips due to limiting to her room  to walk with lines and for care with reduced endurance.  Stairs            Wheelchair Mobility    Modified Rankin (Stroke Patients Only)       Balance  Overall balance assessment: Needs assistance Sitting-balance support: Feet supported Sitting balance-Leahy Scale: Good   Postural control: Posterior lean Standing balance support: Bilateral upper extremity supported;No upper extremity supported (tried both techniques) Standing balance-Leahy Scale: Fair                               Pertinent Vitals/Pain Pain Assessment: No/denies pain    Home Living Family/patient expects to be discharged to:: Private residence Living Arrangements: Alone Available Help at Discharge: Family;Available PRN/intermittently Type of Home: House Home Access: Stairs to enter   [employer] of Steps: 1 Home Layout: One level Home Equipment: None      Prior Function Level of Independence: Independent               Hand Dominance        Extremity/Trunk Assessment   Upper Extremity Assessment: Overall WFL for tasks assessed           Lower Extremity Assessment: Generalized weakness      Cervical / Trunk Assessment: Normal  Communication   Communication: No difficulties  Cognition Arousal/Alertness: Awake/alert Behavior During Therapy: WFL for tasks assessed/performed Overall Cognitive Status: Within Functional Limits for tasks assessed                      General Comments General comments (skin integrity, edema, etc.): Pt was assessed for pulse and O2 sats  with 98% resting O2 sats, with 90% sat after walks, pulse stable at 100-102.    Exercises        Assessment/Plan    PT Assessment Patient needs continued PT services  PT Diagnosis Generalized weakness   PT Problem List Decreased strength;Decreased range of motion;Decreased activity tolerance;Decreased balance;Decreased mobility;Decreased coordination;Decreased knowledge of use of DME;Decreased safety awareness;Cardiopulmonary status limiting activity  PT Treatment Interventions DME instruction;Gait training;Functional mobility training;Stair  training;Therapeutic activities;Therapeutic exercise;Balance training;Neuromuscular re-education;Patient/family education   PT Goals (Current goals can be found in the Care Plan section) Acute Rehab PT Goals Patient Stated Goal: to feel better PT Goal Formulation: With patient Time For Goal Achievement: 09/09/15 Potential to Achieve Goals: Good    Frequency Min 2X/week   Barriers to discharge Decreased caregiver support home alone    Co-evaluation               End of Session Equipment Utilized During Treatment: Oxygen;Gait belt Activity Tolerance: Patient limited by fatigue Patient left: in bed;with call bell/phone within reach Nurse Communication: Mobility status         Time: 1102-1129 PT Time Calculation (min) (ACUTE ONLY): 27 min   Charges:   PT Evaluation $Initial PT Evaluation Tier I: 1 Procedure PT Treatments $Gait Training: 8-22 mins   PT G CodesRamond Dial 03-Sep-2015, 1:43 PM   Mee Hives, PT MS Acute Rehab Dept. Number: [phone] [phone] and [place] [phone]

## 2015-08-27 LAB — HEPARIN LEVEL (UNFRACTIONATED): Heparin Unfractionated: 0.56 IU/mL (ref 0.30–0.70)

## 2015-08-27 LAB — CBC
HEMATOCRIT: 31.1 % — AB (ref 36.0–46.0)
HEMOGLOBIN: 10.2 g/dL — AB (ref 12.0–15.0)
MCH: 28.3 pg (ref 26.0–34.0)
MCHC: 32.8 g/dL (ref 30.0–36.0)
MCV: 86.4 fL (ref 78.0–100.0)
Platelets: 128 10*3/uL — ABNORMAL LOW (ref 150–400)
RBC: 3.6 MIL/uL — ABNORMAL LOW (ref 3.87–5.11)
RDW: 17 % — ABNORMAL HIGH (ref 11.5–15.5)
WBC: 7.7 10*3/uL (ref 4.0–10.5)

## 2015-08-27 LAB — BASIC METABOLIC PANEL
Anion gap: 10 (ref 5–15)
BUN: 62 mg/dL — AB (ref 6–20)
CHLORIDE: 106 mmol/L (ref 101–111)
CO2: 18 mmol/L — AB (ref 22–32)
Calcium: 9 mg/dL (ref 8.9–10.3)
Creatinine, Ser: 2.73 mg/dL — ABNORMAL HIGH (ref 0.44–1.00)
GFR calc Af Amer: 17 mL/min — ABNORMAL LOW (ref >60)
GFR calc non Af Amer: 15 mL/min — ABNORMAL LOW (ref >60)
GLUCOSE: 76 mg/dL (ref 65–99)
POTASSIUM: 4.5 mmol/L (ref 3.5–5.1)
Sodium: 134 mmol/L — ABNORMAL LOW (ref 135–145)

## 2015-08-27 LAB — PROTIME-INR
INR: 1.8 — ABNORMAL HIGH (ref 0.00–1.49)
Prothrombin Time: 20.8 seconds — ABNORMAL HIGH (ref 11.6–15.2)

## 2015-08-27 LAB — GLUCOSE, CAPILLARY
GLUCOSE-CAPILLARY: 167 mg/dL — AB (ref 65–99)
Glucose-Capillary: 78 mg/dL (ref 65–99)
Glucose-Capillary: 146 mg/dL — ABNORMAL HIGH (ref 65–99)
Glucose-Capillary: 189 mg/dL — ABNORMAL HIGH (ref 65–99)

## 2015-08-27 MED ORDER — WARFARIN SODIUM 2.5 MG PO TABS
2.5000 mg | ORAL_TABLET | Freq: Once | ORAL | Status: AC
  Administered 2015-08-27: 2.5 mg via ORAL
  Filled 2015-08-27: qty 1

## 2015-08-27 NOTE — Progress Notes (Signed)
[facility]                                                                              Patient Demographics  Julie Carpenter, is a 81 y.o. female, DOB - 1926-08-03, [id number]  Admit date - 08/22/2015   Admitting Physician Waldemar Dickens, MD  Outpatient Primary MD for the patient is Chevis Pretty, [place]  LOS - 5   Chief Complaint  Patient presents with  . Shortness of Breath      HPI on 08/22/2015 by Ms. [name] NP/Dr. Linna Darner Julie Carpenter is a 81 y.o. female with multiple medical problems not limited to type 2 diabetes, chronic kidney disease and hypertension. Patient was seen by PCP 5 days ago with shortness of breath and elevated blood sugar. She had new T-wave inversions in anterior leads on EKG. Her weight was up with lower extremity swelling. We admitted the patient, felt dyspnea likely secondary to atrial fibrillation, not volume overload. BNP was in 700s but she had acute on chronic renal failure. . She had elevated troponin but this was felt to be secondary to demand ischemia secondary to tachycardia and uncontrolled blood pressure. She was treated for UTI as well as acute on chronic kidney disease during that admission.   Patient back in ED with dyspnea and chest tightness. BNP has doubled, Creatinine has continued to rise. Patient lives alone, fairly active but now finding it difficult to walk across the room. She cannot sleep without elevating her cell phone pillows and this has been going on for about 2 months. Lasix was decreased from 40-20 mg daily apparently 6 weeks ago. Patient was falling, felt to be secondary to hypotension.   Interim history Patient found to have high probability VQ scan. Given her creatinine, cannot confirmed with CT chest. Patient started on heparin and Coumadin for probable PE. Patient did develop some blood-tinged sputum today. Had long conversation with family regarding risks versus benefits of anticoagulation. They  would like to continue with anticoagulation at this time. Suspect patient can be discharged to home with home health when her INR is therapeutic.  Assessment & Plan   Acute respiratory failure with hypoxia/Dyspnea/Pulmonary Embolism -Likely multifactorial: ?PE vs CHF vs Afib -O2 sats were noted to be 86 upon admission -Patient was recently admitted and discharged from [facility] for exertional dyspnea -Echocardiogram from 08/18/2015 shows an EF of 65-70%, unable to evaluate diastolic function -Patient does have atrial fibrillation however does seem to be rate controlled at this time -D-dimer was elevated at 5.98 -V/Q scan obtained, showing high probability for PE -Continue heparin and coumadin per pharmacy -LE doppler: Chronic DVT in R femoral, popliteal, PT, peroneal veins. L chronic DVT  In proximal femoral -Continue supplemental oxygen to maintain saturations above 92% -Patient did develop some blood-tinged sputum this morning. Discussed extensively with the patient's daughter. At this time we'll continue with anticoagulation given benefits versus risks.  ?Acute diastolic dysfunction -Last echocardiogram could not evaluate diastolic function due to atrial fibrillation -Will continue to monitor intake and output, daily weight -BNP at admission 1382 -lasix held due to AKI  Mild elevation in troponin -Possibly secondary to the above -Troponin  cycled, fairly flat 0.08 -Patient was evaluated by cardiology of her last admission and found to be not a candidate for invasive workup  Pyuria/UTI -UA on 08/18/2015 showed many bacteria, WBC 6-30, negative nitrites. -Repeat UA 08/23/2015: many bacteria, TNTC WBC, large leukocytes -Urine culture >100K Klebsiella pneumonia-mainly pansensitive except to ampicillin -Dose of fosfomycin given  Acute on chronic Chronic kidney disease, stage IV -Baseline creatinine appears to be between 1.7-2 -Currently 2.73 -Hold Lasix -Spoke with Dr.  Mercy Moore, patient is not likely a candidate for HD  Type 2 diabetes mellitus -continue insulin sliding scale a CBG monitoring -HbA1C 07/24/2015: 8.7  Atrial fibrillation -CHADSVASC 6 (age, gender, CHF, DM, HTN) -Patient not on anticoagulation due to history of GI bleed -Spoke with daughter, patient was on xarelto in the past- developed GI bleed.  Given risks vs benefits, will continue with anticoagulation.  -Continue coumadin, INR 1.80  Hypertension -Stable, continue Coreg  Hyperlipidemia -Continue statin, fenofibrate  Chronic Normocytic anemia -Baseline Hb 10-11, currently 10.2, continue to monitor CBC closely given history of GI bleed and starting [facility]  Physical deconditioning -PT recommended home health  Code Status: DNR  Family Communication: None at bedside, daughter via phone  Disposition Plan: Admitted. Suspect patient can discharge the next 24-48 hours once her INR is therapeutic.  Time Spent in minutes   30 minutes  Procedures  VQ scan LE doppler  Consults   None  DVT Prophylaxis  Heparin/coumadin  Lab Results  Component Value Date   PLT 128* 08/27/2015    Medications  Scheduled Meds: . amitriptyline  50 mg Oral QHS  . antiseptic oral rinse  7 mL Mouth Rinse BID  . aspirin EC  325 mg Oral Once  . atorvastatin  40 mg Oral q morning - 10a  . calcitRIOL  0.25 mcg Oral Once per day on Mon Wed Fri  . carvedilol  12.5 mg Oral BID WC  . fenofibrate  54 mg Oral Daily  . furosemide  20 mg Oral Daily  . insulin aspart  0-15 Units Subcutaneous TID WC  . warfarin  2.5 mg Oral ONCE-1800  . Warfarin - Pharmacist Dosing Inpatient   Does not apply q1800   Continuous Infusions: . heparin 850 Units/hr (08/27/15 0159)   PRN Meds:.acetaminophen **OR** acetaminophen, HYDROcodone-acetaminophen, ipratropium-albuterol, morphine injection, nitroGLYCERIN, ondansetron **OR** ondansetron (ZOFRAN) IV, technetium TC 19M diethylenetriame-pentaacetic acid  Antibiotics     Anti-infectives    None     Subjective:   Julie Carpenter seen and examined today.  Patient has no complaints this morning. Denies any chest pain, feels her shortness of breath has improved. Denies any blood in her stool or urine. Did have a small amount of blood tinged sputum.  Objective:   Filed Vitals:   08/26/15 2000 08/27/15 0419 08/27/15 0937 08/27/15 1219  BP: 110/70 99/71 113/77 115/72  Pulse: 86 85 94 98  Temp: 98.2 F (36.8 C) 98 F (36.7 C)  97.5 F (36.4 C)  TempSrc: Oral Oral  Oral  Resp: 20 19 18 20   Height:      Weight:  76.749 kg (169 lb 3.2 oz)    SpO2: 100% 97% 97% 100%    Wt Readings from Last 3 Encounters:  08/27/15 76.749 kg (169 lb 3.2 oz)  08/17/15 76.658 kg (169 lb)  08/17/15 76.658 kg (169 lb)     Intake/Output Summary (Last 24 hours) at 08/27/15 1326 Last data filed at 08/27/15 1217  Gross per 24 hour  Intake 1041.04 ml  Output  1676 ml  Net -634.96 ml    Exam (no change from previous day)  General: Well developed, well nourished, NAD  HEENT: NCAT, mucous membranes moist.   Cardiovascular: S1 S2 auscultated, Irregular  Respiratory: Clear to auscultation bilaterally   Abdomen: Soft, nontender, nondistended, + bowel sounds  Extremities: warm dry without cyanosis clubbing. +1 edema in LE B/L  Neuro: AAOx3, nonfocal  Psych: Normal affect and demeanor, pleasant   Data Review   Micro Results Recent Results (from the past 240 hour(s))  Culture, Urine     Status: None   Collection Time: 08/24/15  4:59 PM  Result Value Ref Range Status   Specimen Description URINE, RANDOM  Final   Special Requests NONE  Final   Culture >=100,000 COLONIES/mL KLEBSIELLA PNEUMONIAE  Final   Report Status 08/26/2015 FINAL  Final   Organism ID, Bacteria KLEBSIELLA PNEUMONIAE  Final      Susceptibility   Klebsiella pneumoniae - MIC*    AMPICILLIN >=32 RESISTANT Resistant     CEFAZOLIN <=4 SENSITIVE Sensitive     CEFTRIAXONE <=1 SENSITIVE  Sensitive     CIPROFLOXACIN <=0.25 SENSITIVE Sensitive     GENTAMICIN <=1 SENSITIVE Sensitive     IMIPENEM <=0.25 SENSITIVE Sensitive     NITROFURANTOIN 32 SENSITIVE Sensitive     TRIMETH/SULFA <=20 SENSITIVE Sensitive     AMPICILLIN/SULBACTAM 4 SENSITIVE Sensitive     PIP/TAZO <=4 SENSITIVE Sensitive     * >=100,000 COLONIES/mL KLEBSIELLA PNEUMONIAE    Radiology Reports Dg Chest 2 View  08/22/2015  CLINICAL DATA:  Chest pressure for 1 week EXAM: CHEST  2 VIEW COMPARISON:  08/17/2015 FINDINGS: Cardiomediastinal silhouette is stable. Both lungs are clear. Stable osteopenia and mild degenerative changes thoracic spine. Stable compression deformity lower thoracic spine. IMPRESSION: No active cardiopulmonary disease.  No significant change. Electronically Signed   By: Lahoma Crocker M.D.   On: 08/22/2015 13:33   Dg Chest 2 View  08/17/2015  CLINICAL DATA:  Shortness of breath for 2 days. Abnormal EKG. Atrial fibrillation. Diabetes and hypertension. EXAM: CHEST  2 VIEW COMPARISON:  None. FINDINGS: The heart size and mediastinal contours are within normal limits. Both lungs are clear. No evidence of pneumothorax or pleural effusion. Old lower thoracic vertebral body compression fracture deformity noted as well as mild thoracic spine degenerative changes. IMPRESSION: No active cardiopulmonary disease. Electronically Signed   By: Earle Gell M.D.   On: 08/17/2015 17:16   Nm Pulmonary Perf And Vent  08/23/2015  CLINICAL DATA:  Shortness of breath, wheezing, positive D-dimer EXAM: NUCLEAR MEDICINE VENTILATION - PERFUSION LUNG SCAN TECHNIQUE: Ventilation images were obtained in multiple projections using inhaled aerosol Tc-3m DTPA. Perfusion images were obtained in multiple projections after intravenous injection of Tc-61m MAA. RADIOPHARMACEUTICALS:  [id number] millicurie [id number] DTPA aerosol inhalation and 4.1 millicurie [id number] MAA IV COMPARISON:  Chest x-ray 08/22/2015 FINDINGS: Ventilation: No  focal ventilation defect. Perfusion: There is decreased perfusion right upper lobe apical and right lower lobe laterally. The chest x-ray is unremarkable. Findings are high probability for pulmonary embolus. IMPRESSION: No ventilation defects. There is decreased perfusion in right apical lung and right lower lobe laterally. No chest x-ray abnormality. Findings are high probability for pulmonary embolus. Electronically Signed   By: Lahoma Crocker M.D.   On: 08/23/2015 11:51    CBC  Recent Labs Lab 08/23/15 0006 08/24/15 1000 08/25/15 0620 08/26/15 0400 08/27/15 0301  WBC 8.0 7.5 7.4 8.2 7.7  HGB 10.4* 10.3* 10.4* 10.2* 10.2*  HCT 32.5*  32.1* 31.9* 31.2* 31.1*  PLT 189 210 218 173 128*  MCV 85.8 86.5 87.2 86.9 86.4  MCH 27.4 27.8 28.4 28.4 28.3  MCHC 32.0 32.1 32.6 32.7 32.8  RDW 15.8* 16.4* 16.6* 16.8* 17.0*    Chemistries   Recent Labs Lab 08/23/15 0006 08/24/15 1000 08/25/15 0620 08/26/15 0400 08/27/15 0301  NA 133* 134* 132* 135 134*  K 4.3 4.5 4.4 4.5 4.5  CL 101 104 102 107 106  CO2 22 19* 21* 19* 18*  GLUCOSE 306* 245* 168* 112* 76  BUN 45* 62* 65* 65* 62*  CREATININE 3.02* 2.99* 2.91* 2.81* 2.73*  CALCIUM 9.2 9.2 8.9 8.8* 9.0   ------------------------------------------------------------------------------------------------------------------ estimated creatinine clearance is 14.9 mL/min (by C-G formula based on Cr of 2.73). ------------------------------------------------------------------------------------------------------------------ No results for input(s): HGBA1C in the last 72 hours. ------------------------------------------------------------------------------------------------------------------ No results for input(s): CHOL, HDL, LDLCALC, TRIG, CHOLHDL, LDLDIRECT in the last 72 hours. ------------------------------------------------------------------------------------------------------------------ No results for input(s): TSH, T4TOTAL, T3FREE, THYROIDAB in the  last 72 hours.  Invalid input(s): FREET3 ------------------------------------------------------------------------------------------------------------------ No results for input(s): VITAMINB12, FOLATE, FERRITIN, TIBC, IRON, RETICCTPCT in the last 72 hours.  Coagulation profile  Recent Labs Lab 08/24/15 1130 08/25/15 0620 08/26/15 0400 08/27/15 0301  INR 1.37 1.29 1.36 1.80*    No results for input(s): DDIMER in the last 72 hours.  Cardiac Enzymes  Recent Labs Lab 08/23/15 1420 08/23/15 2254 08/24/15 0126  TROPONINI 0.09* 0.08* 0.08*   ------------------------------------------------------------------------------------------------------------------ Invalid input(s): POCBNP    Brita Jurgensen D.O. on 08/27/2015 at 1:26 PM  Between 7am to 7pm - Pager - [phone]  After 7pm go to [email or web address] - password [email or web address]  And look for the night coverage person covering for me after hours  [facility] Office  [phone]

## 2015-08-27 NOTE — Progress Notes (Signed)
ANTICOAGULATION CONSULT NOTE - Follow Up Consult  Pharmacy Consult for heparin/warfarin Indication: pulmonary embolus  Allergies  Allergen Reactions  . Penicillins Anaphylaxis    Has patient had a PCN reaction causing immediate rash, facial/tongue/throat swelling, SOB or lightheadedness with hypotension: Yes Has patient had a PCN reaction causing severe rash involving mucus membranes or skin necrosis: No Has patient had a PCN reaction that required hospitalization No Has patient had a PCN reaction occurring within the last 10 years: No If all of the above answers are "NO", then may proceed with Cephalosporin use.  Marland Kitchen Benicar [Olmesartan Medoxomil] Swelling    Lips swelling  . Ciprofloxacin Swelling  . Hctz [Hydrochlorothiazide] Swelling    Lips swelling  . Januvia [Sitagliptin Phosphate] Swelling    Lips swelling  . Norvasc [Amlodipine Besylate] Swelling    Lips swelling  . Ramipril Swelling    Lips swelling    Patient Measurements: Height: 5\' 5"  (165.1 cm) Weight: 169 lb 3.2 oz (76.749 kg) IBW/kg (Calculated) : 57 Heparin Dosing Weight: 72 kg  Vital Signs: Temp: 98 F (36.7 C) (12/18 0419) Temp Source: Oral (12/18 0419) BP: 113/77 mmHg (12/18 0937) Pulse Rate: 94 (12/18 0937)  Labs:  Recent Labs  08/25/15 0620 08/26/15 0400 08/27/15 0301  HGB 10.4* 10.2* 10.2*  HCT 31.9* 31.2* 31.1*  PLT 218 173 128*  LABPROT 16.3* 16.9* 20.8*  INR 1.29 1.36 1.80*  HEPARINUNFRC 0.66 0.56 0.56  CREATININE 2.91* 2.81* 2.73*    Estimated Creatinine Clearance: 14.9 mL/min (by C-G formula based on Cr of 2.73).   Assessment: 40 yoF admitted 08/22/2015 with shortness of breath and chest tightness. Patient does have history of GIB in the past on Xarelto. VQ scan showed high probability of PE. Pharmacy consulted for heparin and warfarin (day 4 of overlap).  HL 0.56 (therapeutic), current INR 1.8 (trend up) Baseline INR 1.37, H/H stable, Plt wnl  Goal of Therapy:  INR  2-3 Heparin level 0.3-0.7 units/ml Monitor platelets by anticoagulation protocol: Yes   Plan:  - Continue heparin 850 units/hr - Give warfarin 2.5 mg x1 tonight (decreased due to INR trend up) -Consider discontinuing aspirin or decrease to 81mg /day - Monitor daily HL, INR, CBC and s/sx of bleeding  Hildred Laser, Pharm D 08/27/2015 11:27 AM

## 2015-08-28 DIAGNOSIS — I2690 Septic pulmonary embolism without acute cor pulmonale: Secondary | ICD-10-CM

## 2015-08-28 DIAGNOSIS — N184 Chronic kidney disease, stage 4 (severe): Secondary | ICD-10-CM

## 2015-08-28 DIAGNOSIS — N179 Acute kidney failure, unspecified: Secondary | ICD-10-CM

## 2015-08-28 LAB — GLUCOSE, CAPILLARY
GLUCOSE-CAPILLARY: 186 mg/dL — AB (ref 65–99)
Glucose-Capillary: 166 mg/dL — ABNORMAL HIGH (ref 65–99)
Glucose-Capillary: 125 mg/dL — ABNORMAL HIGH (ref 65–99)
Glucose-Capillary: 117 mg/dL — ABNORMAL HIGH (ref 65–99)

## 2015-08-28 LAB — CBC
HCT: 30.9 % — ABNORMAL LOW (ref 36.0–46.0)
HEMOGLOBIN: 9.8 g/dL — AB (ref 12.0–15.0)
MCH: 27.4 pg (ref 26.0–34.0)
MCHC: 31.7 g/dL (ref 30.0–36.0)
MCV: 86.3 fL (ref 78.0–100.0)
PLATELETS: 106 10*3/uL — AB (ref 150–400)
RBC: 3.58 MIL/uL — ABNORMAL LOW (ref 3.87–5.11)
RDW: 16.9 % — ABNORMAL HIGH (ref 11.5–15.5)
WBC: 7.5 10*3/uL (ref 4.0–10.5)

## 2015-08-28 LAB — HEPARIN LEVEL (UNFRACTIONATED): HEPARIN UNFRACTIONATED: 0.64 [IU]/mL (ref 0.30–0.70)

## 2015-08-28 LAB — PROTIME-INR
INR: 2.43 — ABNORMAL HIGH (ref 0.00–1.49)
PROTHROMBIN TIME: 26.1 s — AB (ref 11.6–15.2)

## 2015-08-28 NOTE — Care Management Note (Signed)
Case Management Note  Patient Details  Name: Julie Carpenter MRN: [id number] Date of Birth: 21-Aug-1926  Subjective/Objective:        Admitted withDiabetes            Action/Plan: Patient lives alone with daughters close by to assist in her care. She has a walker, 3:1 and a cane at home. PCP is Dr Ronnald Collum; has private insurance with [facility] with prescription drug coverage; pharmacy of choice is [facility] in [place]. Patient states that she does not have any problem getting her medication. Her daughter monitors her blood glucose twice a day and cooks her meals. Patient could benefit from [facility] services. [place] choices offered, her daughter is unable to choose at this time. A list of [place] agencies given to her for her county. CM will follow up on which agency they would like to have.  Expected Discharge Date:       Possibly 08/30/2015           Expected Discharge Plan:  [place]  Discharge planning Services  CM Consult  Choice offered to:  Adult Children     HH Arranged:  RN, PT, OT HH Agency:   undetermined at this time. CM will follow up with patient and daughter in the am  Status of Service:  In process, will continue to follow  Medicare Important Message Given:  Yes  Sherrilyn Rist [phone] 08/28/2015, 2:46 PM

## 2015-08-28 NOTE — Evaluation (Addendum)
Occupational Therapy Evaluation Patient Details Name: MELEAH STUMPH MRN: [id number] DOB: 12-Feb-1926 Today's Date: 08/28/2015    History of Present Illness 81 yo female with onset of SOB and pyuria was found to have elevated troponin from demand of PE and UTI, CKD 3.     Clinical Impression   Patient presenting with deconditioning and decreased ADL & functional mobility independence secondary to above. Patient independent to mod I PTA. Patient currently functioning at a supervision to min assist level for functional mobility and requires up to max assist for LB ADLs. Patient will benefit from acute OT to increase overall independence in the areas of ADLs, functional mobility, and overall safety in order to safely discharge home with daughter and HHOT.     Follow Up Recommendations  Home health OT;Supervision/Assistance - 24 hour    Equipment Recommendations  None recommended by OT    Recommendations for Other Services  None at this time   Precautions / Restrictions Precautions Precautions: Fall Restrictions Weight Bearing Restrictions: No    Mobility Bed Mobility Overal bed mobility: Needs Assistance Bed Mobility: Supine to Sit     Supine to sit: Min guard     General bed mobility comments: HOB slightly elevated, minimal use of bed rails.   Transfers Overall transfer level: Needs assistance Equipment used: None Transfers: Sit to/from Stand Sit to Stand: Min assist General transfer comment: Pt reports not using any AD PTA, pt did start to furniture walk during mobility     Balance Overall balance assessment: Needs assistance Sitting-balance support: No upper extremity supported;Feet supported Sitting balance-Leahy Scale: Good     Standing balance support: No upper extremity supported;During functional activity Standing balance-Leahy Scale: Poor    ADL Overall ADL's : Needs assistance/impaired Eating/Feeding: Set up;Sitting   Grooming: Standing;Min guard    Upper Body Bathing: Set up;Sitting   Lower Body Bathing: Sit to/from stand;Moderate assistance   Upper Body Dressing : Set up;Sitting   Lower Body Dressing: Maximal assistance;Sit to/from stand   Toilet Transfer: Min guard;Comfort height toilet;Ambulation;Grab bars   Toileting- Clothing Manipulation and Hygiene: Min guard;Sit to/from stand       Functional mobility during ADLs: Minimal assistance;Cueing for safety General ADL Comments: Pt with difficult time reaching BLEs due to SOB. Pt may benefit from AE to increase independence and safety with this. Pt on RA majority of session and sats remained 94% or greater, except at end of session sats =89%. At this time, donned 2L/min supplemental 02. Pt's daughter present towards end of session and stated she would be there to assist prn post acute d/c. Encouraged daughter to be home with her for the first few days. Discussed recommendation of HHOT as well.      Pertinent Vitals/Pain Pain Assessment: No/denies pain     Hand Dominance Right   Extremity/Trunk Assessment Upper Extremity Assessment Upper Extremity Assessment: Overall WFL for tasks assessed   Lower Extremity Assessment Lower Extremity Assessment: Defer to PT evaluation   Cervical / Trunk Assessment Cervical / Trunk Assessment: Normal   Communication Communication Communication: No difficulties   Cognition Arousal/Alertness: Awake/alert Behavior During Therapy: WFL for tasks assessed/performed Overall Cognitive Status: Within Functional Limits for tasks assessed             Home Living Family/patient expects to be discharged to:: Private residence Living Arrangements: Alone Available Help at Discharge: Family;Available PRN/intermittently Type of Home: House Home Access: Stairs to enter Entrance Stairs-Number of Steps: 1   Home Layout: One level  Bathroom Shower/Tub: Tub/shower unit;Door   Bathroom Toilet: Standard Bathroom Accessibility: No   Home  Equipment: Bedside commode;Shower seat;Walker - 4 wheels;Walker - 2 wheels   Daughter reports that above equipment is either patient's OR is easily accessible for patient. Encouraged patient NOT to shower without daughter present, however daughter states that patient normally takes sponge baths.     Prior Functioning/Environment Level of Independence: Independent     OT Diagnosis: Generalized weakness   OT Problem List: Decreased strength;Decreased activity tolerance;Impaired balance (sitting and/or standing);Decreased safety awareness;Decreased knowledge of use of DME or AE   OT Treatment/Interventions: Self-care/ADL training;Therapeutic exercise;Energy conservation;DME and/or AE instruction;Therapeutic activities;Patient/family education;Balance training    OT Goals(Current goals can be found in the care plan section) Acute Rehab OT Goals Patient Stated Goal: get better OT Goal Formulation: With patient/family Time For Goal Achievement: 09/11/15 Potential to Achieve Goals: Good ADL Goals Pt Will Perform Grooming: with modified independence;standing Pt Will Perform Lower Body Bathing: with modified independence;sit to/from stand;with adaptive equipment Pt Will Perform Lower Body Dressing: with modified independence;with adaptive equipment;sit to/from stand Pt Will Transfer to Toilet: with modified independence;ambulating;bedside commode Pt Will Perform Tub/Shower Transfer: Tub transfer;ambulating;3 in 1;with modified independence Additional ADL Goal #1: Pt will be independent to mod I with functional mobility using LRAD prn  OT Frequency: Min 2X/week   Barriers to D/C: none known at this time   End of Session Equipment Utilized During Treatment: Gait belt;Oxygen Nurse Communication: Mobility status;Other (comment) (patient's 02 sats on RA)  Activity Tolerance: Patient tolerated treatment well Patient left: in chair;with call bell/phone within reach;with family/visitor present;with  chair alarm set   Time: [id number] OT Time Calculation (min): 27 min Charges:  OT General Charges $OT Visit: 1 Procedure OT Evaluation $Initial OT Evaluation Tier I: 1 Procedure OT Treatments $Self Care/Home Management : 8-22 mins  Chrys Racer , MS, OTR/L, CLT Pager: [phone]  08/28/2015, 11:18 AM

## 2015-08-28 NOTE — Progress Notes (Signed)
PROGRESS NOTE  Julie Carpenter [id number] DOB: Oct 22, 1925 DOA: 08/22/2015 PCP: Chevis Pretty, FNP  HPI on 08/22/2015 by Ms. [name] NP/Dr. Linna Carpenter CHUDNEY [name] is a 81 y.o. female with multiple medical problems not limited to type 2 diabetes, chronic kidney disease and hypertension. Patient was seen by PCP 5 days ago with shortness of breath and elevated blood sugar. She had new T-wave inversions in anterior leads on EKG. Her weight was up with lower extremity swelling. We admitted the patient, felt dyspnea likely secondary to atrial fibrillation, not volume overload. BNP was in 700s but she had acute on chronic renal failure. . She had elevated troponin but this was felt to be secondary to demand ischemia secondary to tachycardia and uncontrolled blood pressure. She was treated for UTI as well as acute on chronic kidney disease during that admission.   Patient back in ED with dyspnea and chest tightness. BNP has doubled, Creatinine has continued to rise. Patient lives alone, fairly active but now finding it difficult to walk across the room. She cannot sleep without elevating her cell phone pillows and this has been going on for about 2 months. Lasix was decreased from 40-20 mg daily apparently 6 weeks ago. Patient was falling, felt to be secondary to hypotension.   Interim history Patient found to have high probability VQ scan. Given her creatinine, cannot confirmed with CT chest. Patient started on heparin and Coumadin for probable PE. Patient did develop some blood-tinged sputum 12/18. Had long conversation with family regarding risks versus benefits of anticoagulation. They would like to continue with anticoagulation     Assessment/Plan: Acute respiratory failure with hypoxia/Dyspnea/Pulmonary Embolism -Likely multifactorial: ?PE vs CHF vs Afib -O2 sats were noted to be 86 upon admission -Patient was recently admitted and discharged from [facility] for exertional dyspnea -Echocardiogram from 08/18/2015 shows an EF of 65-70%, unable to evaluate diastolic function -Patient does have atrial fibrillation however does seem to be rate controlled at this time -D-dimer was elevated at 5.98 -V/Q scan obtained, showing high probability for PE -Continue heparin and coumadin per pharmacy -LE doppler: Chronic DVT in R femoral, popliteal, PT, peroneal veins. L chronic DVT In proximal femoral -Continue supplemental oxygen to maintain saturations above 92% -Patient did develop some blood-tinged sputum 12/18. Discussed extensively with the patient's daughter. At this time we'll continue with anticoagulation given benefits versus risks. -Hgb remains stable  ?Acute diastolic dysfunction -Last echocardiogram could not evaluate diastolic function due to atrial fibrillation -Will continue to monitor intake and output, daily weight -BNP at admission 1382 -lasix held due to AKI  Mild elevation in troponin -Possibly secondary to the above -Troponin cycled,  Flat Trend 0.08 -Patient was evaluated by cardiology of her last admission and found to be not a candidate for invasive workup  Pyuria/UTI -UA on 08/18/2015 showed many bacteria, WBC 6-30, negative nitrites. -Repeat UA 08/23/2015: many bacteria, TNTC WBC, large leukocytes -Urine culture >100K Klebsiella pneumonia-mainly pansensitive except to ampicillin -Dose of fosfomycin given  Acute on chronic Chronic kidney disease, stage IV -Baseline creatinine appears to be between 1.7-2 -Currently 2.73 -Continue home dose of furosemide -Spoke with Dr. Mercy Moore, patient is not likely a candidate for HD  Type 2 diabetes mellitus -continue insulin sliding scale a CBG monitoring -HbA1C 07/24/2015: 8.7 -CBGs well-controlled  Atrial fibrillation -CHADSVASC 6 (age, gender, CHF, DM, HTN) -Patient not on anticoagulation due to history of GI bleed -Spoke with daughter,  patient was on xarelto in the  past- developed GI bleed. Given risks vs benefits, will continue with anticoagulation.  -Continue coumadin, INR 1.80  Hypertension -Stable, continue Coreg  Hyperlipidemia -Continue statin, fenofibrate  Chronic Normocytic anemia -Baseline Hb 10-11, currently 10.2, continue to monitor CBC closely given history of GI bleed and starting [facility]  Physical deconditioning -PT recommended home health  Code Status: DNR  Family Communication: None at bedside, daughter via phone  Disposition Plan: Discharge 08/29/2015 if medically stable         Procedures/Studies: Dg Chest 2 View  08/22/2015  CLINICAL DATA:  Chest pressure for 1 week EXAM: CHEST  2 VIEW COMPARISON:  08/17/2015 FINDINGS: Cardiomediastinal silhouette is stable. Both lungs are clear. Stable osteopenia and mild degenerative changes thoracic spine. Stable compression deformity lower thoracic spine. IMPRESSION: No active cardiopulmonary disease.  No significant change. Electronically Signed   By: Lahoma Crocker M.D.   On: 08/22/2015 13:33   Dg Chest 2 View  08/17/2015  CLINICAL DATA:  Shortness of breath for 2 days. Abnormal EKG. Atrial fibrillation. Diabetes and hypertension. EXAM: CHEST  2 VIEW COMPARISON:  None. FINDINGS: The heart size and mediastinal contours are within normal limits. Both lungs are clear. No evidence of pneumothorax or pleural effusion. Old lower thoracic vertebral body compression fracture deformity noted as well as mild thoracic spine degenerative changes. IMPRESSION: No active cardiopulmonary disease. Electronically Signed   By: Earle Gell M.D.   On: 08/17/2015 17:16   Nm Pulmonary Perf And Vent  08/23/2015  CLINICAL DATA:  Shortness of breath, wheezing, positive D-dimer EXAM: NUCLEAR MEDICINE VENTILATION - PERFUSION LUNG SCAN TECHNIQUE: Ventilation images were obtained in multiple projections using inhaled aerosol Tc-6m DTPA. Perfusion images were obtained in multiple projections after intravenous  injection of Tc-86m MAA. RADIOPHARMACEUTICALS:  [id number] millicurie [id number] DTPA aerosol inhalation and 4.1 millicurie [id number] MAA IV COMPARISON:  Chest x-ray 08/22/2015 FINDINGS: Ventilation: No focal ventilation defect. Perfusion: There is decreased perfusion right upper lobe apical and right lower lobe laterally. The chest x-ray is unremarkable. Findings are high probability for pulmonary embolus. IMPRESSION: No ventilation defects. There is decreased perfusion in right apical lung and right lower lobe laterally. No chest x-ray abnormality. Findings are high probability for pulmonary embolus. Electronically Signed   By: Lahoma Crocker M.D.   On: 08/23/2015 11:51        Subjective: Overall patient is breathing better. Denies any fevers, chest, chest discomfort, nausea, vomiting, diarrhea, felt pain. No dysuria or hematuria.  Objective: Filed Vitals:   08/28/15 0022 08/28/15 0556 08/28/15 0924 08/28/15 1203  BP: 134/88 128/61 108/69 123/74  Pulse: 85 86 92 88  Temp: 97.9 F (36.6 C) 97.9 F (36.6 C) 97.6 F (36.4 C) 97.5 F (36.4 C)  TempSrc: Oral Oral Oral Oral  Resp: 20 20 20 20   Height:      Weight:  76.567 kg (168 lb 12.8 oz)    SpO2: 97% 96% 98% 100%    Intake/Output Summary (Last 24 hours) at 08/28/15 1638 Last data filed at 08/28/15 1421  Gross per 24 hour  Intake   1010 ml  Output    601 ml  Net    409 ml   Weight change: -0.181 kg (-6.4 oz) Exam:   General:  Pt is alert, follows commands appropriately, not in acute distress  HEENT: No icterus, No thrush, No neck mass, [address]/AT  Cardiovascular: RRR, S1/S2, no rubs, no gallops  Respiratory: Fine bibasilar crackles. No wheezing. Good air movement  Abdomen: Soft/+BS, non tender, non distended, no guarding  Extremities: trace le edema, No lymphangitis, No petechiae, No rashes, no synovitis  Data Reviewed: Basic Metabolic Panel:  Recent Labs Lab 08/23/15 0006 08/24/15 1000 08/25/15 0620 08/26/15 0400  08/27/15 0301  NA 133* 134* 132* 135 134*  K 4.3 4.5 4.4 4.5 4.5  CL 101 104 102 107 106  CO2 22 19* 21* 19* 18*  GLUCOSE 306* 245* 168* 112* 76  BUN 45* 62* 65* 65* 62*  CREATININE 3.02* 2.99* 2.91* 2.81* 2.73*  CALCIUM 9.2 9.2 8.9 8.8* 9.0   Liver Function Tests: No results for input(s): AST, ALT, ALKPHOS, BILITOT, PROT, ALBUMIN in the last 168 hours. No results for input(s): LIPASE, AMYLASE in the last 168 hours. No results for input(s): AMMONIA in the last 168 hours. CBC:  Recent Labs Lab 08/24/15 1000 08/25/15 0620 08/26/15 0400 08/27/15 0301 08/28/15 0321  WBC 7.5 7.4 8.2 7.7 7.5  HGB 10.3* 10.4* 10.2* 10.2* 9.8*  HCT 32.1* 31.9* 31.2* 31.1* 30.9*  MCV 86.5 87.2 86.9 86.4 86.3  PLT 210 218 173 128* 106*   Cardiac Enzymes:  Recent Labs Lab 08/22/15 1842 08/23/15 0006 08/23/15 1420 08/23/15 2254 08/24/15 0126  TROPONINI 0.08* 0.11* 0.09* 0.08* 0.08*   BNP: Invalid input(s): POCBNP CBG:  Recent Labs Lab 08/27/15 1121 08/27/15 1650 08/27/15 2111 08/28/15 0706 08/28/15 1104  GLUCAP 167* 146* 189* 166* 125*    Recent Results (from the past 240 hour(s))  Culture, Urine     Status: None   Collection Time: 08/24/15  4:59 PM  Result Value Ref Range Status   Specimen Description URINE, RANDOM  Final   Special Requests NONE  Final   Culture >=100,000 COLONIES/mL KLEBSIELLA PNEUMONIAE  Final   Report Status 08/26/2015 FINAL  Final   Organism ID, Bacteria KLEBSIELLA PNEUMONIAE  Final      Susceptibility   Klebsiella pneumoniae - MIC*    AMPICILLIN >=32 RESISTANT Resistant     CEFAZOLIN <=4 SENSITIVE Sensitive     CEFTRIAXONE <=1 SENSITIVE Sensitive     CIPROFLOXACIN <=0.25 SENSITIVE Sensitive     GENTAMICIN <=1 SENSITIVE Sensitive     IMIPENEM <=0.25 SENSITIVE Sensitive     NITROFURANTOIN 32 SENSITIVE Sensitive     TRIMETH/SULFA <=20 SENSITIVE Sensitive     AMPICILLIN/SULBACTAM 4 SENSITIVE Sensitive     PIP/TAZO <=4 SENSITIVE Sensitive     *  >=100,000 COLONIES/mL KLEBSIELLA PNEUMONIAE     Scheduled Meds: . amitriptyline  50 mg Oral QHS  . antiseptic oral rinse  7 mL Mouth Rinse BID  . atorvastatin  40 mg Oral q morning - 10a  . calcitRIOL  0.25 mcg Oral Once per day on Mon Wed Fri  . carvedilol  12.5 mg Oral BID WC  . fenofibrate  54 mg Oral Daily  . furosemide  20 mg Oral Daily  . insulin aspart  0-15 Units Subcutaneous TID WC  . Warfarin - Pharmacist Dosing Inpatient   Does not apply q1800   Continuous Infusions:     Dejion Grillo, DO  [facility] Pager [phone]  If 7PM-7AM, please contact night-coverage [email or web address] Password [email or web address] 08/28/2015, 4:38 PM   LOS: 6 days

## 2015-08-28 NOTE — Progress Notes (Signed)
ANTICOAGULATION CONSULT NOTE - Follow Up Consult  Pharmacy Consult for heparin/warfarin Indication: pulmonary embolus  Allergies  Allergen Reactions  . Penicillins Anaphylaxis    Has patient had a PCN reaction causing immediate rash, facial/tongue/throat swelling, SOB or lightheadedness with hypotension: Yes Has patient had a PCN reaction causing severe rash involving mucus membranes or skin necrosis: No Has patient had a PCN reaction that required hospitalization No Has patient had a PCN reaction occurring within the last 10 years: No If all of the above answers are "NO", then may proceed with Cephalosporin use.  Marland Kitchen Benicar [Olmesartan Medoxomil] Swelling    Lips swelling  . Ciprofloxacin Swelling  . Hctz [Hydrochlorothiazide] Swelling    Lips swelling  . Januvia [Sitagliptin Phosphate] Swelling    Lips swelling  . Norvasc [Amlodipine Besylate] Swelling    Lips swelling  . Ramipril Swelling    Lips swelling    Patient Measurements: Height: 5\' 5"  (165.1 cm) Weight: 168 lb 12.8 oz (76.567 kg) IBW/kg (Calculated) : 57 Heparin Dosing Weight: 72 kg  Vital Signs: Temp: 97.9 F (36.6 C) (12/19 0556) Temp Source: Oral (12/19 0556) BP: 128/61 mmHg (12/19 0556) Pulse Rate: 86 (12/19 0556)  Labs:  Recent Labs  08/26/15 0400 08/27/15 0301 08/28/15 0321  HGB 10.2* 10.2* 9.8*  HCT 31.2* 31.1* 30.9*  PLT 173 128* 106*  LABPROT 16.9* 20.8* 26.1*  INR 1.36 1.80* 2.43*  HEPARINUNFRC 0.56 0.56 0.64  CREATININE 2.81* 2.73*  --     Estimated Creatinine Clearance: 14.9 mL/min (by C-G formula based on Cr of 2.73).   Assessment: Julie Carpenter admitted 08/22/2015 with shortness of breath and chest tightness. Patient does have history of GIB in the past on Xarelto. VQ scan showed high probability of PE. Pharmacy consulted for heparin and warfarin (day 5 of overlap).  HL 0.64 (therapeutic), current INR 1.8>2.43 (trend up) Baseline INR 1.37, H/H stable, Plt wnl   Goal of Therapy:  INR  2-3 Heparin level 0.3-0.7 units/ml Monitor platelets by anticoagulation protocol: Yes   Plan:  - Continue heparin 850 units/hr - Hold tonight's warfarin dose (due to INR jump 1.8>2.Julie) - Consider discontinuing aspirin or decrease to 81mg /day - Monitor daily HL, INR, CBC and s/sx of bleeding  Almalik Weissberg C. Lennox Grumbles, PharmD Pharmacy Resident  Pager: [phone] 08/28/2015 8:56 AM

## 2015-08-28 NOTE — Care Management Important Message (Signed)
Important Message  Patient Details  Name: Julie Carpenter MRN: [id number] Date of Birth: 06-12-1926   Medicare Important Message Given:  Yes    Marlean Mortell P Vernon 08/28/2015, 3:13 PM

## 2015-08-29 LAB — CBC
HEMATOCRIT: 29.5 % — AB (ref 36.0–46.0)
Hemoglobin: 9.3 g/dL — ABNORMAL LOW (ref 12.0–15.0)
MCH: 27.2 pg (ref 26.0–34.0)
MCHC: 31.5 g/dL (ref 30.0–36.0)
MCV: 86.3 fL (ref 78.0–100.0)
Platelets: 96 10*3/uL — ABNORMAL LOW (ref 150–400)
RBC: 3.42 MIL/uL — AB (ref 3.87–5.11)
RDW: 16.9 % — ABNORMAL HIGH (ref 11.5–15.5)
WBC: 7.3 10*3/uL (ref 4.0–10.5)

## 2015-08-29 LAB — BASIC METABOLIC PANEL
ANION GAP: 9 (ref 5–15)
BUN: 52 mg/dL — ABNORMAL HIGH (ref 6–20)
CHLORIDE: 107 mmol/L (ref 101–111)
CO2: 19 mmol/L — ABNORMAL LOW (ref 22–32)
Calcium: 9.4 mg/dL (ref 8.9–10.3)
Creatinine, Ser: 2.47 mg/dL — ABNORMAL HIGH (ref 0.44–1.00)
GFR calc non Af Amer: 16 mL/min — ABNORMAL LOW (ref >60)
GFR, EST AFRICAN AMERICAN: 19 mL/min — AB (ref >60)
Glucose, Bld: 121 mg/dL — ABNORMAL HIGH (ref 65–99)
POTASSIUM: 4.8 mmol/L (ref 3.5–5.1)
SODIUM: 135 mmol/L (ref 135–145)

## 2015-08-29 LAB — GLUCOSE, CAPILLARY
GLUCOSE-CAPILLARY: 134 mg/dL — AB (ref 65–99)
GLUCOSE-CAPILLARY: 203 mg/dL — AB (ref 65–99)

## 2015-08-29 LAB — PROTIME-INR
INR: 2.39 — ABNORMAL HIGH (ref 0.00–1.49)
Prothrombin Time: 25.8 seconds — ABNORMAL HIGH (ref 11.6–15.2)

## 2015-08-29 MED ORDER — WARFARIN SODIUM 3 MG PO TABS: 3.0000 mg | ORAL_TABLET | Freq: Once | ORAL | Status: DC

## 2015-08-29 MED ORDER — WARFARIN SODIUM 3 MG PO TABS: 3.0000 mg | ORAL_TABLET | Freq: Every day | ORAL | Status: DC

## 2015-08-29 NOTE — Progress Notes (Signed)
ANTICOAGULATION CONSULT NOTE - Follow Up Consult  Pharmacy Consult for heparin/warfarin Indication: pulmonary embolus  Allergies  Allergen Reactions  . Penicillins Anaphylaxis    Has patient had a PCN reaction causing immediate rash, facial/tongue/throat swelling, SOB or lightheadedness with hypotension: Yes Has patient had a PCN reaction causing severe rash involving mucus membranes or skin necrosis: No Has patient had a PCN reaction that required hospitalization No Has patient had a PCN reaction occurring within the last 10 years: No If all of the above answers are "NO", then may proceed with Cephalosporin use.  Marland Kitchen Benicar [Olmesartan Medoxomil] Swelling    Lips swelling  . Ciprofloxacin Swelling  . Hctz [Hydrochlorothiazide] Swelling    Lips swelling  . Januvia [Sitagliptin Phosphate] Swelling    Lips swelling  . Norvasc [Amlodipine Besylate] Swelling    Lips swelling  . Ramipril Swelling    Lips swelling    Patient Measurements: Height: 5\' 5"  (165.1 cm) Weight: 167 lb 4.8 oz (75.887 kg) (scale b) IBW/kg (Calculated) : 57 Heparin Dosing Weight: 72 kg  Vital Signs: Temp: 98.1 F (36.7 C) (12/20 0913) Temp Source: Oral (12/20 0913) BP: 103/67 mmHg (12/20 0913) Pulse Rate: 91 (12/20 0913)  Labs:  Recent Labs  08/27/15 0301 08/28/15 0321 08/29/15 0520  HGB 10.2* 9.8* 9.3*  HCT 31.1* 30.9* 29.5*  PLT 128* 106* 96*  LABPROT 20.8* 26.1* 25.8*  INR 1.80* 2.43* 2.39*  HEPARINUNFRC 0.56 0.64  --   CREATININE 2.73*  --  2.47*    Estimated Creatinine Clearance: 16.4 mL/min (by C-G formula based on Cr of 2.47).   Assessment: 16 yoF admitted 08/22/2015 with shortness of breath and chest tightness. Patient does have history of GIB in the past on Xarelto. VQ scan showed high probability of PE. Pharmacy consulted for heparin and warfarin.     Baseline INR 1.37, INR today 2.39. Heparin now off, H/H stable, Plt wnl   Goal of Therapy:  INR 2-3 Heparin level 0.3-0.7  units/ml Monitor platelets by anticoagulation protocol: Yes   Plan:  - Heparin now discontinued - Coumadin 3mg  x1 tonight - Consider discontinuing aspirin or decrease to 81mg /day - Recommend home dose of 3mg  daily - Monitor daily HL, INR, CBC and s/sx of bleeding  Lino Wickliff C. Lennox Grumbles, PharmD Pharmacy Resident  Pager: [phone] 08/29/2015 10:15 AM

## 2015-08-29 NOTE — Progress Notes (Addendum)
D/c instructions reviewed with pt and her daughter, copy of instructions given to pt. Discussed coumadin and handout for coumadin and diet with pt and daughter. Handout given to pt also. Pt d/c'd via wheelchair with her belongings, escorted by hospital volunteer.

## 2015-08-29 NOTE — Progress Notes (Signed)
Talked to patient with family members present for [facility] choices, patient chose [facility]. Butch Penny with [facility] called for arrangements. Mindi Slicker [facility] [phone]

## 2015-08-29 NOTE — Plan of Care (Signed)
Problem: Activity: Goal: Capacity to carry out activities will improve Outcome: Not Applicable Date Met:  73/40/37 Pt has not history of CHF, EF 65-70%.  This pathway not applicable, here for BLE DVT.

## 2015-08-29 NOTE — Progress Notes (Signed)
Physical Therapy Treatment Patient Details Name: Julie Carpenter MRN: [id number] DOB: Nov 29, 1925 Today's Date: 08/29/2015    History of Present Illness 81 yo female with onset of SOB and pyuria was found to have elevated troponin from demand of PE and UTI, CKD 3.      PT Comments    Pt needs to be assisted for gait at least initially as she is having some adjustment to being off O2.  If HHPT comes out will need to assess her for tolerance of activity at home and use of supplemental O2.  She may not tolerate that level of increased activity well without it.  Follow Up Recommendations  Home health PT;Supervision/Assistance - 24 hour     Equipment Recommendations  Rolling walker with 5" wheels    Recommendations for Other Services       Precautions / Restrictions Precautions Precautions: Fall Restrictions Weight Bearing Restrictions: No    Mobility  Bed Mobility Overal bed mobility: Modified Independent Bed Mobility: Supine to Sit           General bed mobility comments: used higher HOB but otherwise no assist  Transfers Overall transfer level: Modified independent Equipment used: Rolling walker (2 wheeled);1 person hand held assist Transfers: Sit to/from [facility] Sit to Stand: Supervision (for safety) Stand pivot transfers: Min guard          Ambulation/Gait Ambulation/Gait assistance: Min guard;Min assist Ambulation Distance (Feet): 150 Feet Assistive device: Rolling walker (2 wheeled);1 person hand held assist Gait Pattern/deviations: Ataxic;Wide base of support (listing to R at times with RW needed) Gait velocity: slow Gait velocity interpretation: Below normal speed for age/gender General Gait Details: has O2 off and could try longer trip as a result   Stairs            Wheelchair Mobility    Modified Rankin (Stroke Patients Only)       Balance Overall balance assessment: Needs assistance Sitting-balance support: Feet  supported Sitting balance-Leahy Scale: Good   Postural control: Posterior lean Standing balance support: Bilateral upper extremity supported Standing balance-Leahy Scale: Poor Standing balance comment: must have the walker to control balance                    Cognition Arousal/Alertness: Awake/alert Behavior During Therapy: WFL for tasks assessed/performed Overall Cognitive Status: Within Functional Limits for tasks assessed                      Exercises      General Comments General comments (skin integrity, edema, etc.): Without O2 was slow to get reading for O2 in room.  Pre gait was 93% with delay, post91% but started readign at 84%.  May just be perfusion       Pertinent Vitals/Pain Pain Assessment: No/denies pain    Home Living                      Prior Function            PT Goals (current goals can now be found in the care plan section) Progress towards PT goals: Progressing toward goals    Frequency  Min 2X/week    PT Plan Current plan remains appropriate    Co-evaluation             End of Session Equipment Utilized During Treatment: Gait belt Activity Tolerance: Patient limited by fatigue;Treatment limited secondary to medical complications (Comment) (sats dropping off the  O2) Patient left: in chair;with call bell/phone within reach     Time: 0917-0942 PT Time Calculation (min) (ACUTE ONLY): 25 min  Charges:  $Gait Training: 8-22 mins $Therapeutic Activity: 8-22 mins                    G Codes:      Ramond Dial September 19, 2015, 12:18 PM   Mee Hives, PT MS Acute Rehab Dept. Number: [phone] [id number] and [place] [phone]

## 2015-08-29 NOTE — Progress Notes (Signed)
Occupational Therapy Treatment Patient Details Name: Julie Carpenter MRN: [id number] DOB: 21-Aug-1926 Today's Date: 08/29/2015    History of present illness 81 yo female with onset of SOB and pyuria was found to have elevated troponin from demand of PE and UTI, CKD 3.     OT comments  This 81 yo female admitted with above, making progress today with basic ADLs (able to get her socks on with increased effort but functional). O2 sats not lower than 97% on RA entire session (checked x3). Pt will continue to benefit from acute OT with follow up [place].  Follow Up Recommendations  Home health OT;Supervision/Assistance - 24 hour    Equipment Recommendations  None recommended by OT       Precautions / Restrictions Precautions Precautions: Fall Restrictions Weight Bearing Restrictions: No       Mobility Bed Mobility Overal bed mobility: Modified Independent Bed Mobility: Supine to Sit              Transfers Overall transfer level: Needs assistance   Transfers: Sit to/from Stand Sit to Stand: Supervision (VCs for safe hand placement for use of RW)              Balance Overall balance assessment: Needs assistance Sitting-balance support: No upper extremity supported;Feet supported Sitting balance-Leahy Scale: Good     Standing balance support: Bilateral upper extremity supported Standing balance-Leahy Scale: Poor Standing balance comment: Reliant oon RW                   ADL Overall ADL's : Needs assistance/impaired Eating/Feeding: Independent;Sitting   Grooming: Wash/dry hands;Set up;Supervision/safety;Standing                   Toilet Transfer: Supervision/safety;Ambulation;Comfort height toilet;Grab bars   Toileting- Clothing Manipulation and Hygiene: Supervision/safety;Sit to/from stand                          Cognition   Behavior During Therapy: [facility] for tasks assessed/performed Overall Cognitive Status: Within Functional Limits  for tasks assessed                                    Pertinent Vitals/ Pain       Pain Assessment: No/denies pain         Frequency Min 2X/week     Progress Toward Goals  OT Goals(current goals can now be found in the care plan section)  Progress towards OT goals: Progressing toward goals     Plan Discharge plan remains appropriate       End of Session Equipment Utilized During Treatment: Rolling walker   Activity Tolerance Patient tolerated treatment well   Patient Left in chair;with call bell/phone within reach;with chair alarm set   Nurse Communication          Time: [phone] OT Time Calculation (min): 26 min  Charges: OT General Charges $OT Visit: 1 Procedure OT Treatments $Self Care/Home Management : 23-37 mins  Almon Register [id number] 08/29/2015, 9:23 AM

## 2015-08-29 NOTE — Discharge Summary (Addendum)
Physician Discharge Summary  [name] [id number] DOB: 02-Sep-1926 DOA: 08/22/2015  PCP: Chevis Pretty, FNP  Admit date: 08/22/2015 Discharge date: 08/29/2015  Recommendations for Outpatient Follow-up:  1. Pt will need to follow up with PCP in 1-2 weeks post discharge 2. Please obtain BMP and CBC in one week 3. Check INR on 09/01/15 and adjust wafarin accordingly  Discharge Diagnoses:  Acute respiratory failure with hypoxia/Dyspnea/Pulmonary Embolism -Likely multifactorial: ?PE vs CHF vs Afib -O2 sats were noted to be 86 upon admission -Patient was recently admitted and discharged from [facility] for exertional dyspnea -Echocardiogram from 08/18/2015 shows an EF of 65-70%, unable to evaluate diastolic function -Patient does have atrial fibrillation however does seem to be rate controlled at this time -D-dimer was elevated at 5.98 -V/Q scan obtained, showing high probability for PE -Continue heparin and coumadin per pharmacy--> patient will go home with warfarin 3 mg daily -LE doppler: Chronic DVT in R femoral, popliteal, PT, peroneal veins. L chronic DVT In proximal femoral -Continue supplemental oxygen to maintain saturations above 92% -Patient did develop some blood-tinged sputum 12/18. Discussed extensively with the patient's daughter. At this time we'll continue with anticoagulation given benefits versus risks. -Hgb remains stable--9.3 on the day of discharge  ?Acute diastolic dysfunction -Last echocardiogram could not evaluate diastolic function due to atrial fibrillation -Will continue to monitor intake and output, daily weight -BNP at admission 1382 -lasix held due to AKI  Mild elevation in troponin -Possibly secondary to the above -Troponin cycled, Flat Trend 0.08 -Patient was evaluated by cardiology of her last admission and found to be not a candidate for invasive workup  Pyuria/UTI -UA on 08/18/2015 showed many bacteria, WBC 6-30,  negative nitrites. -Repeat UA 08/23/2015: many bacteria, TNTC WBC, large leukocytes -Urine culture >100K Klebsiella pneumonia-mainly pansensitive except to ampicillin -Dose of fosfomycin given  Acute on chronic Chronic kidney disease, stage IV -Baseline creatinine appears to be between 1.7-2 -Continue home dose of furosemide -Spoke with Dr. Mercy Moore, patient is not likely a candidate for HD -Serum creatinine 2.47 on the day of discharge  Type 2 diabetes mellitus -continue insulin sliding scale a CBG monitoring -HbA1C 07/24/2015: 8.7 -CBGs well-controlled during the hospitalization  Atrial fibrillation -CHADSVASC 6 (age, gender, CHF, DM, HTN) -Patient not on anticoagulation previously due to history of GI bleed -Spoke with daughter, patient was on xarelto in the past- developed GI bleed. Given risks vs benefits, will continue with anticoagulation.  -Continue coumadin, INR 1.80  Hypertension -Stable, continue Coreg -Home with carvedilol 12.5 mg twice a day  Hyperlipidemia -Continue statin, fenofibrate  Chronic Normocytic anemia -Baseline Hb 10-11, currently 10.2, continue to monitor CBC closely given history of GI bleed and starting AC -Hemoglobin 9.3 on the day of discharge  Physical deconditioning -PT recommended home health  Discharge Condition: stable  Disposition:  Follow-up Information    Follow up with Chevis Pretty, FNP On 09/06/2015.   Specialty:  Family Medicine   Why:  @ 10;15am   Contact information:   [place] [address] [phone]       Follow up with [facility].   Why:  They will do your home health care at your home   Contact information:   [address] [place] [address] [phone]     home  Diet:carb modified Wt Readings from Last 3 Encounters:  08/29/15 75.887 kg (167 lb 4.8 oz)  08/17/15 76.658 kg (169 lb)  08/17/15 76.658 kg (169 lb)  History of present illness:    KENDAHL Carpenter is a 81 y.o. female with multiple medical problems not limited to type 2 diabetes, chronic kidney disease and hypertension. Patient was seen by PCP 5 days ago with shortness of breath and elevated blood sugar. She had new T-wave inversions in anterior leads on EKG. Her weight was up with lower extremity swelling. We admitted the patient, felt dyspnea likely secondary to atrial fibrillation, not volume overload. BNP was in 700s but she had acute on chronic renal failure. . She had elevated troponin but this was felt to be secondary to demand ischemia secondary to tachycardia and uncontrolled blood pressure. She was treated for UTI as well as acute on chronic kidney disease during that admission.   Patient back in ED with dyspnea and chest tightness. BNP has doubled, Creatinine has continued to rise. Patient lives alone, fairly active but now finding it difficult to walk across the room. She cannot sleep without elevating her cell phone pillows and this has been going on for about 2 months. Lasix was decreased from 40-20 mg daily apparently 6 weeks ago. Patient was falling, felt to be secondary to hypotension.   Interim history Patient found to have high probability VQ scan. Given her creatinine, cannot confirmed with CT chest. Patient started on heparin and Coumadin for probable PE. Patient did develop some blood-tinged sputum 12/18. Had long conversation with family regarding risks versus benefits of anticoagulation. They would like to continue with anticoagulation. The patient's hemoglobin remained largely stable. The patient remained hemodynamically stable throughout the hospitalization. The patient was instructed to follow-up with her primary care provider for INR check on 09/01/2015.   Discharge Exam: Filed Vitals:   08/29/15 0700 08/29/15 0913  BP: 102/68 103/67  Pulse: 84 91  Temp: 97.9 F (36.6 C) 98.1 F (36.7 C)  Resp: 18 18   Filed Vitals:   08/28/15 1203 08/29/15 0700  08/29/15 0913 08/29/15 0915  BP: 123/74 102/68 103/67   Pulse: 88 84 91   Temp: 97.5 F (36.4 C) 97.9 F (36.6 C) 98.1 F (36.7 C)   TempSrc: Oral Oral Oral   Resp: 20 18 18    Height:      Weight:  75.887 kg (167 lb 4.8 oz)    SpO2: 100% 94% 100% 97%   General: A&O x 3, NAD, pleasant, cooperative Cardiovascular: RRR, no rub, no gallop, no S3 Respiratory: : Bibasilar crackles. No wheezes. Good air movement  Abdomen:soft, nontender, nondistended, positive bowel sounds Extremities: trace LE edema, No lymphangitis, no petechiae  Discharge Instructions      Discharge Instructions    Diet - low sodium heart healthy    Complete by:  As directed      Increase activity slowly    Complete by:  As directed             Medication List    STOP taking these medications        aspirin 81 MG EC tablet      TAKE these medications        atorvastatin 40 MG tablet  Commonly known as:  LIPITOR  TAKE ONE (1) TABLET EACH DAY     calcitRIOL 0.25 MCG capsule  Commonly known as:  ROCALTROL  Take 0.25 mcg by mouth 3 (three) times a week. Mon.,Wed.,Fri.     calcium carbonate 1500 (600 CA) MG Tabs tablet  Commonly known as:  OSCAL  Take 600 mg of elemental calcium by mouth daily with  breakfast.     carvedilol 12.5 MG tablet  Commonly known as:  COREG  Take 1 tablet (12.5 mg total) by mouth 2 (two) times daily with a meal.     epoetin alfa 2000 UNIT/ML injection  Commonly known as:  EPOGEN,PROCRIT  Inject 2,000 Units into the vein every 21 ( twenty-one) days. Patient has procrit administered at [facility] short stay every 3 weeks     fenofibrate 145 MG tablet  Commonly known as:  TRICOR  Take 1 tablet (145 mg total) by mouth daily.     furosemide 20 MG tablet  Commonly known as:  LASIX  Take 1 tablet (20 mg total) by mouth daily.     glimepiride 2 MG tablet  Commonly known as:  AMARYL  Take 1.5 tablets (3 mg total) by mouth daily before breakfast.     warfarin 3 MG tablet    Commonly known as:  COUMADIN  Take 1 tablet (3 mg total) by mouth daily.         The results of significant diagnostics from this hospitalization (including imaging, microbiology, ancillary and laboratory) are listed below for reference.    Significant Diagnostic Studies: Dg Chest 2 View  08/22/2015  CLINICAL DATA:  Chest pressure for 1 week EXAM: CHEST  2 VIEW COMPARISON:  08/17/2015 FINDINGS: Cardiomediastinal silhouette is stable. Both lungs are clear. Stable osteopenia and mild degenerative changes thoracic spine. Stable compression deformity lower thoracic spine. IMPRESSION: No active cardiopulmonary disease.  No significant change. Electronically Signed   By: Lahoma Crocker M.D.   On: 08/22/2015 13:33   Dg Chest 2 View  08/17/2015  CLINICAL DATA:  Shortness of breath for 2 days. Abnormal EKG. Atrial fibrillation. Diabetes and hypertension. EXAM: CHEST  2 VIEW COMPARISON:  None. FINDINGS: The heart size and mediastinal contours are within normal limits. Both lungs are clear. No evidence of pneumothorax or pleural effusion. Old lower thoracic vertebral body compression fracture deformity noted as well as mild thoracic spine degenerative changes. IMPRESSION: No active cardiopulmonary disease. Electronically Signed   By: Earle Gell M.D.   On: 08/17/2015 17:16   Nm Pulmonary Perf And Vent  08/23/2015  CLINICAL DATA:  Shortness of breath, wheezing, positive D-dimer EXAM: NUCLEAR MEDICINE VENTILATION - PERFUSION LUNG SCAN TECHNIQUE: Ventilation images were obtained in multiple projections using inhaled aerosol Tc-72m DTPA. Perfusion images were obtained in multiple projections after intravenous injection of Tc-57m MAA. RADIOPHARMACEUTICALS:  [id number] millicurie [id number] DTPA aerosol inhalation and 4.1 millicurie [id number] MAA IV COMPARISON:  Chest x-ray 08/22/2015 FINDINGS: Ventilation: No focal ventilation defect. Perfusion: There is decreased perfusion right upper lobe apical and right  lower lobe laterally. The chest x-ray is unremarkable. Findings are high probability for pulmonary embolus. IMPRESSION: No ventilation defects. There is decreased perfusion in right apical lung and right lower lobe laterally. No chest x-ray abnormality. Findings are high probability for pulmonary embolus. Electronically Signed   By: Lahoma Crocker M.D.   On: 08/23/2015 11:51     Microbiology: Recent Results (from the past 240 hour(s))  Culture, Urine     Status: None   Collection Time: 08/24/15  4:59 PM  Result Value Ref Range Status   Specimen Description URINE, RANDOM  Final   Special Requests NONE  Final   Culture >=100,000 COLONIES/mL KLEBSIELLA PNEUMONIAE  Final   Report Status 08/26/2015 FINAL  Final   Organism ID, Bacteria KLEBSIELLA PNEUMONIAE  Final      Susceptibility   Klebsiella pneumoniae - MIC*  AMPICILLIN >=32 RESISTANT Resistant     CEFAZOLIN <=4 SENSITIVE Sensitive     CEFTRIAXONE <=1 SENSITIVE Sensitive     CIPROFLOXACIN <=0.25 SENSITIVE Sensitive     GENTAMICIN <=1 SENSITIVE Sensitive     IMIPENEM <=0.25 SENSITIVE Sensitive     NITROFURANTOIN 32 SENSITIVE Sensitive     TRIMETH/SULFA <=20 SENSITIVE Sensitive     AMPICILLIN/SULBACTAM 4 SENSITIVE Sensitive     PIP/TAZO <=4 SENSITIVE Sensitive     * >=100,000 COLONIES/mL KLEBSIELLA PNEUMONIAE     Labs: Basic Metabolic Panel:  Recent Labs Lab 08/24/15 1000 08/25/15 0620 08/26/15 0400 08/27/15 0301 08/29/15 0520  NA 134* 132* 135 134* 135  K 4.5 4.4 4.5 4.5 4.8  CL 104 102 107 106 107  CO2 19* 21* 19* 18* 19*  GLUCOSE 245* 168* 112* 76 121*  BUN 62* 65* 65* 62* 52*  CREATININE 2.99* 2.91* 2.81* 2.73* 2.47*  CALCIUM 9.2 8.9 8.8* 9.0 9.4   Liver Function Tests: No results for input(s): AST, ALT, ALKPHOS, BILITOT, PROT, ALBUMIN in the last 168 hours. No results for input(s): LIPASE, AMYLASE in the last 168 hours. No results for input(s): AMMONIA in the last 168 hours. CBC:  Recent Labs Lab 08/25/15 0620  08/26/15 0400 08/27/15 0301 08/28/15 0321 08/29/15 0520  WBC 7.4 8.2 7.7 7.5 7.3  HGB 10.4* 10.2* 10.2* 9.8* 9.3*  HCT 31.9* 31.2* 31.1* 30.9* 29.5*  MCV 87.2 86.9 86.4 86.3 86.3  PLT 218 173 128* 106* 96*   Cardiac Enzymes:  Recent Labs Lab 08/22/15 1842 08/23/15 0006 08/23/15 1420 08/23/15 2254 08/24/15 0126  TROPONINI 0.08* 0.11* 0.09* 0.08* 0.08*   BNP: Invalid input(s): POCBNP CBG:  Recent Labs Lab 08/28/15 1104 08/28/15 1719 08/28/15 2122 08/29/15 0643 08/29/15 1249  GLUCAP 125* 186* 117* 134* 203*    Time coordinating discharge:  Greater than 30 minutes  Signed:  Zerenity Bowron, DO [facility] Pager: [phone] 08/29/2015, 1:17 PM

## 2015-08-30 ENCOUNTER — Telehealth: Payer: Self-pay | Admitting: *Deleted

## 2015-08-30 NOTE — Telephone Encounter (Signed)
   Call Completed and Appointment Scheduled: Yes, Date: 09/06/15 with Dr Emiliano Dyer INFORMATION Date of Discharge:08/29/15  Discharge Facility: [facility]  Principal Discharge Diagnosis: Afib  Patient and/or caregiver is knowledgeable of his/her condition(s) and treatment: Yes   MEDICATION RECONCILIATION Medication list reviewed with patient: Yes  Patient is able to obtain needed medications: Yes   ACTIVITIES OF DAILY LIVING  Is the patient able to perform his/her own ADLs: No: Daughter is helping with these  Patient is receiving home health services: yes They are coming out for an evaluation this week   PATIENT EDUCATION Questions/Concerns Discussed: She is staying with her daughter right now. They were told to come in tomorrow for protime. Appt wasn't scheduled so I set one up with our clinical pharmacist at 12:15. Reminded of appt on 09/06/15 with Dr Evette Doffing. They will follow up sooner if necessary.

## 2015-08-31 ENCOUNTER — Ambulatory Visit (INDEPENDENT_AMBULATORY_CARE_PROVIDER_SITE_OTHER): Payer: Medicare Other | Admitting: Pharmacist

## 2015-08-31 ENCOUNTER — Encounter: Payer: Self-pay | Admitting: Pharmacist

## 2015-08-31 VITALS — Wt 166.2 lb

## 2015-08-31 DIAGNOSIS — I2699 Other pulmonary embolism without acute cor pulmonale: Secondary | ICD-10-CM | POA: Insufficient documentation

## 2015-08-31 DIAGNOSIS — Z7901 Long term (current) use of anticoagulants: Secondary | ICD-10-CM | POA: Insufficient documentation

## 2015-08-31 LAB — POCT INR: INR: 3.4

## 2015-08-31 NOTE — Patient Instructions (Signed)
Anticoagulation Dose Instructions as of 08/31/2015      Dorene Grebe Tue Wed Thu Fri Sat   Week 1 1.5 mg 1/2 3 mg 1 3 mg 1 3 mg 1 Hold 3 mg 1 3 mg 1   Week 2 1.5 mg 1/2 3 mg 1 3 mg 1 3 mg 1 1.5 mg 1/2 3 mg 1 3 mg 1    Description        No warfarin today - THursday, December 22nd.   Then start new dose of warfarin 3mg  - take 1/2 tablet on sundays and thursdays and 1 tablet all other days.       INR was 3.4 today - goal is 2.0 to 3.0

## 2015-08-31 NOTE — Progress Notes (Signed)
Subjective:     Indication: PE - was diagnosed 08/22/2015.  Patient release from hospital 08/28/2015 on warfarin Bleeding signs/symptoms: None Thromboembolic signs/symptoms: None  Missed Coumadin doses: None Medication changes: no Dietary changes: no Bacterial/viral infection: no Other concerns: no  The following portions of the patient's history were reviewed and updated as appropriate: allergies, current medications, past medical history and problem list.  Julie Carpenter is going out to patient's home twice a week for the next 4 weeks.   Objective:    INR Today: 3.4 Current dose: 12/15:  4mg    12/16: 5mg    12/17: 7.5mg    12/18: 2.5mg    12/19: none   12/20: 3mg    12/21:  3mg      Assessment:    Supratherapeutic INR for goal of 2-3   Plan:    1. New dose: no warfarin today 08/31/2015, then decrease dose to 3mg  tablets - take 1/2 tablet sundays and thursdays and 1 tablet all other days.   2. Next INR: 09/04/2015 - will be performed by [facility] at patient 's home

## 2015-09-05 ENCOUNTER — Encounter (HOSPITAL_COMMUNITY)
Admission: RE | Admit: 2015-09-05 | Discharge: 2015-09-05 | Disposition: A | Discharge status: Home / Self Care | Payer: Medicare Other | Source: Ambulatory Visit | Attending: Nephrology | Admitting: Nephrology

## 2015-09-05 ENCOUNTER — Encounter (HOSPITAL_COMMUNITY): Payer: Self-pay

## 2015-09-05 DIAGNOSIS — N184 Chronic kidney disease, stage 4 (severe): Secondary | ICD-10-CM | POA: Insufficient documentation

## 2015-09-05 DIAGNOSIS — D638 Anemia in other chronic diseases classified elsewhere: Secondary | ICD-10-CM | POA: Insufficient documentation

## 2015-09-05 LAB — HEMOGLOBIN AND HEMATOCRIT, BLOOD
HCT: 33 % — ABNORMAL LOW (ref 36.0–46.0)
Hemoglobin: 10.5 g/dL — ABNORMAL LOW (ref 12.0–15.0)

## 2015-09-05 LAB — IRON AND TIBC
IRON: 52 ug/dL (ref 28–170)
Saturation Ratios: 13 % (ref 10.4–31.8)
TIBC: 409 ug/dL (ref 250–450)
UIBC: 357 ug/dL

## 2015-09-05 LAB — FERRITIN: Ferritin: 74 ng/mL (ref 11–307)

## 2015-09-05 MED ORDER — EPOETIN ALFA 20000 UNIT/ML IJ SOLN
INTRAMUSCULAR | Status: AC
Start: 2015-09-05 — End: 2015-09-05
  Filled 2015-09-05: qty 1

## 2015-09-05 MED ORDER — EPOETIN ALFA 20000 UNIT/ML IJ SOLN
20000.0000 [IU] | INTRAMUSCULAR | Status: DC
Start: 2015-09-05 — End: 2015-09-06
  Administered 2015-09-05: 20000 [IU] via SUBCUTANEOUS

## 2015-09-06 ENCOUNTER — Encounter: Payer: Self-pay | Admitting: Pediatrics

## 2015-09-06 ENCOUNTER — Ambulatory Visit (INDEPENDENT_AMBULATORY_CARE_PROVIDER_SITE_OTHER): Payer: Medicare Other | Admitting: Pediatrics

## 2015-09-06 VITALS — BP 114/77 | HR 91 | Temp 96.8°F | Ht 65.0 in | Wt 164.8 lb

## 2015-09-06 DIAGNOSIS — I2699 Other pulmonary embolism without acute cor pulmonale: Secondary | ICD-10-CM | POA: Diagnosis not present

## 2015-09-06 DIAGNOSIS — Z7901 Long term (current) use of anticoagulants: Secondary | ICD-10-CM

## 2015-09-06 DIAGNOSIS — R609 Edema, unspecified: Secondary | ICD-10-CM

## 2015-09-06 DIAGNOSIS — Z79899 Other long term (current) drug therapy: Secondary | ICD-10-CM | POA: Diagnosis not present

## 2015-09-06 DIAGNOSIS — R600 Localized edema: Secondary | ICD-10-CM

## 2015-09-06 DIAGNOSIS — I10 Essential (primary) hypertension: Secondary | ICD-10-CM | POA: Diagnosis not present

## 2015-09-06 DIAGNOSIS — R0600 Dyspnea, unspecified: Secondary | ICD-10-CM | POA: Diagnosis not present

## 2015-09-06 DIAGNOSIS — I482 Chronic atrial fibrillation, unspecified: Secondary | ICD-10-CM

## 2015-09-06 LAB — POCT INR: INR: 3.5

## 2015-09-06 MED ORDER — WARFARIN SODIUM 2 MG PO TABS: 2.0000 mg | ORAL_TABLET | Freq: Every day | ORAL | Status: DC

## 2015-09-06 NOTE — Progress Notes (Signed)
Results for Julie Carpenter, Julie Carpenter (MRN [id number]) as of 09/06/2015 15:52  Ref. Range 09/05/2015 14:12  Iron Latest Ref Range: 28-170 ug/dL 52  UIBC Latest Units: ug/dL 357  TIBC Latest Ref Range: 250-450 ug/dL 409  Saturation Ratios Latest Ref Range: 10.4-31.8 % 13  Ferritin Latest Ref Range: 11-307 ng/mL 74

## 2015-09-06 NOTE — Patient Instructions (Addendum)
Anticoagulation Dose Instructions as of 09/06/2015      Dorene Grebe Tue Wed Thu Fri Sat   New Dose 2 mg 2 mg 2 mg 2 mg 0 mg 2 mg 2 mg    Description        No warfarin today - Wednesday, December 28th.    Then start new dose of warfarin 2mg  - take 1 tablet daily       INR was 3.5 today

## 2015-09-06 NOTE — Progress Notes (Signed)
Subjective:    Patient ID: Julie Carpenter, female    DOB: May 23, 1926, 81 y.o.   MRN: [id number]  CC: Transitional Care Management  HPI: Julie Carpenter is a 81 y.o. female presenting for Transitional Care Management  Recently admitted for dyspnea thought to likely be multifactorial. She did have hypoxia when she was admitted. Had a high likelihood for PE VQ scan, now on coumadin. Pt also with atrial fibrillation. Per daughter chronic DVTs also Carpenter in LE so pt was told will need to continue lifelong anticoagulation.   Overall has been feeling well since discharge, pt with no complaints today Was seen last week by pharmacist in clinic for INR and coumadin adjustment, INR was elevated so coumadin was decreased.  Staying with her daughter now since hospitalization. PT has been coming by to work with her. Still feeling weak compared to how she felt prior to recent hospitalizations but has been moving around well at home, using walker and cane more. Sometimes having shortness of breath when up walking around, but much better than before.  Daughter Julie Carpenter: [id number]  Depression screen [facility] 2/9 09/06/2015 08/17/2015 07/24/2015 12/26/2014 05/30/2014  Decreased Interest 0 0 0 0 0  Down, Depressed, Hopeless 0 0 0 0 0  PHQ - 2 Score 0 0 0 0 0     Relevant past medical, surgical, family and social history reviewed and updated as indicated. Interim medical history since our last visit reviewed. Allergies and medications reviewed and updated.    ROS: All systems negative other than what is in HPI  History  Smoking status  . Never Smoker   Smokeless tobacco  . Not on file    Past Medical History Patient Active Problem List   Diagnosis Date Noted  . Other acute pulmonary embolism without acute cor pulmonale ([place]) 08/31/2015  . Long term (current) use of anticoagulants [Z79.01] 08/31/2015  . Acute renal failure superimposed on stage 4 chronic kidney disease ([place]) 08/28/2015  .  Pulmonary embolism ([place]) 08/23/2015  . Shortness of breath 08/22/2015  . Diabetes mellitus with complication ([place])   . AKI (acute kidney injury) ([place])   . Dyspnea 08/17/2015  . DOE (dyspnea on exertion) 08/17/2015  . GERD (gastroesophageal reflux disease) 02/23/2014  . Peripheral edema 02/23/2014  . GI bleed 04/05/2013  . Atrial fibrillation ([place]) 02/24/2013  . HTN (hypertension) 12/26/2010  . Chronic kidney disease (CKD) stage G3b/A2, moderately decreased glomerular filtration rate (GFR) between 30-44 mL/min/1.73 square meter and albuminuria creatinine ratio between 30-299 mg/g 12/26/2010  . Hyperlipemia 12/26/2010  . Peripheral neuropathy ([place]) 12/26/2010  . Anemia 12/26/2010  . Diabetes ([place]) 12/26/2010  . Vertebral fracture 12/26/2010  . Venous insufficiency 12/26/2010  . Osteoporosis 12/26/2010  . Thyromegaly 12/26/2010    Current Outpatient Prescriptions  Medication Sig Dispense Refill  . atorvastatin (LIPITOR) 40 MG tablet TAKE ONE (1) TABLET EACH DAY (Patient taking differently: Take 40 mg by mouth at bedtime. ) 90 tablet 1  . calcitRIOL (ROCALTROL) 0.25 MCG capsule Take 0.25 mcg by mouth 3 (three) times a week. Mon.,Wed.,Fri.  5  . carvedilol (COREG) 12.5 MG tablet Take 1 tablet (12.5 mg total) by mouth 2 (two) times daily with a meal. 60 tablet 0  . epoetin alfa (EPOGEN,PROCRIT) 2000 UNIT/ML injection Inject 2,000 Units into the vein every 21 ( twenty-one) days. Patient has procrit administered at [facility] short stay every 3 weeks    . fenofibrate (TRICOR) 145 MG tablet Take 1 tablet (145 mg total)  by mouth daily. 90 tablet 1  . furosemide (LASIX) 20 MG tablet Take 1 tablet (20 mg total) by mouth daily. 90 tablet 1  . glimepiride (AMARYL) 2 MG tablet Take 1.5 tablets (3 mg total) by mouth daily before breakfast. 135 tablet 1  . warfarin (COUMADIN) 3 MG tablet Take 1 tablet (3 mg total) by mouth daily. 15 tablet 0  . calcium carbonate (OSCAL) 1500 (600 CA) MG TABS tablet  Take 600 mg of elemental calcium by mouth daily with breakfast. Reported on 09/06/2015     No current facility-administered medications for this visit.       Objective:    BP 114/77 mmHg  Pulse 91  Temp(Src) 96.8 F (36 C) (Oral)  Ht 5\' 5"  (1.651 m)  Wt 164 lb 12.8 oz (74.753 kg)  BMI 27.42 kg/m2  SpO2 100%  Wt Readings from Last 3 Encounters:  09/06/15 164 lb 12.8 oz (74.753 kg)  09/05/15 166 lb (75.297 kg)  08/31/15 166 lb 4 oz (75.411 kg)     Gen: NAD, alert, cooperative with exam, NCAT EYES: EOMI, no scleral injection or icterus ENT: OP without erythema LYMPH: no cervical LAD CV: NRRR, normal S1/S2, no murmur, distal pulses 2+ b/l Resp: CTABL, no wheezes, normal WOB Abd: +BS, soft, NTND. no guarding or organomegaly Ext: No edema, warm Neuro: Alert     Assessment & Plan:    Julie Carpenter was seen today for transitional care management. Meds were reviewed. Hospital records were reviewed. No complaints today, no bleeding since starting coumadin.   Diagnoses and all orders for this visit:  Encounter for medication management Due for check today, coumadin was decreased 1 week ago -     POCT INR  Essential hypertension Well controlled today. Continue current meds.  Chronic atrial fibrillation (HCC) Rate controlled, now on anti-coagulation as well  Other acute pulmonary embolism without acute cor pulmonale (HCC) Continue warfarin. O2 sats fine.  Peripheral edema None today.  Dyspnea Likely multifactorial, deconditioning, new PE dx. Overall improving daily.  Long term (current) use of anticoagulants [Z79.01] INR check today as above. INR was elevated, goal 2-3, 3.5 today. Will decrease dose. See pt instructions.   Follow up plan: Return in about 4 weeks (around 10/04/2015).  Julie Found, MD [facility] 09/06/2015, 11:05 AM

## 2015-09-08 ENCOUNTER — Telehealth: Payer: Self-pay | Admitting: *Deleted

## 2015-09-08 NOTE — Telephone Encounter (Signed)
Did home health nursing check her INR today? No warfarin tonight (Friday) or Tomorrow (Saturday). Take new dose 2mg  Sunday night and Monday night. Needs to come by clinic to have INR checked on Tuesday here. I think pt staying with daughter [name] [phone].

## 2015-09-08 NOTE — Telephone Encounter (Signed)
Pt daughter aware and apt with tammy Tuesday at 12:30 for inr.

## 2015-09-08 NOTE — Telephone Encounter (Signed)
INR is 4.3 today. She didn't take any coumadin on wed, took 2mg  last night. Please advise advanced and daughter

## 2015-09-08 NOTE — Telephone Encounter (Signed)
Received phone call from [name] stating that patient's INR was 4.3 when the home health nurse checked it this morning. Patient is currently taking 2 mg of coumadin daily.  Julie Carpenter is wanting to know what to do with the coumadin dosage

## 2015-09-08 NOTE — Telephone Encounter (Signed)
lmtcb

## 2015-09-12 ENCOUNTER — Encounter: Payer: Medicare Other | Admitting: Pharmacist

## 2015-09-12 ENCOUNTER — Ambulatory Visit (INDEPENDENT_AMBULATORY_CARE_PROVIDER_SITE_OTHER): Payer: Medicare Other | Admitting: Pharmacist

## 2015-09-12 ENCOUNTER — Telehealth: Payer: Self-pay | Admitting: *Deleted

## 2015-09-12 ENCOUNTER — Ambulatory Visit: Payer: Self-pay | Admitting: Pharmacist

## 2015-09-12 DIAGNOSIS — I2699 Other pulmonary embolism without acute cor pulmonale: Secondary | ICD-10-CM

## 2015-09-12 DIAGNOSIS — Z7901 Long term (current) use of anticoagulants: Secondary | ICD-10-CM

## 2015-09-12 DIAGNOSIS — I482 Chronic atrial fibrillation, unspecified: Secondary | ICD-10-CM

## 2015-09-12 LAB — POCT INR: INR: 1.7

## 2015-09-12 NOTE — Telephone Encounter (Signed)
INR 1.7

## 2015-09-12 NOTE — Telephone Encounter (Signed)
Sub therapeutic anticoagulation but INR was 4.3 last week.  Patient to change dose to warfarin 2mg  - take 1/2 tablet on sundays  And thursdays and 1 tablet all other days.  Home health nurse to recheck INR on Friday 09/15/15.  Patient's daughter notified of above and voiced understanding and Angie with [facility] notified.

## 2015-09-12 NOTE — Progress Notes (Signed)
No charge for labs or visit - INR check through home health service

## 2015-09-14 ENCOUNTER — Ambulatory Visit: Payer: Self-pay | Admitting: Pharmacist

## 2015-09-15 ENCOUNTER — Telehealth: Payer: Self-pay | Admitting: *Deleted

## 2015-09-15 ENCOUNTER — Ambulatory Visit (INDEPENDENT_AMBULATORY_CARE_PROVIDER_SITE_OTHER): Payer: Medicare Other | Admitting: Pharmacist

## 2015-09-15 DIAGNOSIS — I482 Chronic atrial fibrillation, unspecified: Secondary | ICD-10-CM

## 2015-09-15 DIAGNOSIS — I2699 Other pulmonary embolism without acute cor pulmonale: Secondary | ICD-10-CM

## 2015-09-15 DIAGNOSIS — Z7901 Long term (current) use of anticoagulants: Secondary | ICD-10-CM

## 2015-09-15 LAB — POCT INR: INR: 2.6

## 2015-09-15 NOTE — Telephone Encounter (Signed)
Protime 31.6 INR 2.6

## 2015-09-15 NOTE — Telephone Encounter (Signed)
Patient's daughter notified for changes

## 2015-09-15 NOTE — Progress Notes (Signed)
INRchecked by home health nurse - no charge

## 2015-09-15 NOTE — Telephone Encounter (Signed)
INR is therapeutic but has increased from 1.7 to 2.6 in last 3 days.  Recommend decrease warfarin 2mg  to 1/2 tablet MWF and 1 tablet all other days. Recheck INR in 5 days.  LM for [facility] with [facility].

## 2015-09-18 ENCOUNTER — Other Ambulatory Visit: Payer: Self-pay | Admitting: Nurse Practitioner

## 2015-09-18 NOTE — Telephone Encounter (Signed)
Patient has picked up machine

## 2015-09-20 ENCOUNTER — Telehealth: Payer: Self-pay | Admitting: *Deleted

## 2015-09-20 ENCOUNTER — Ambulatory Visit (INDEPENDENT_AMBULATORY_CARE_PROVIDER_SITE_OTHER): Payer: Medicare Other | Admitting: Pharmacist

## 2015-09-20 DIAGNOSIS — I482 Chronic atrial fibrillation, unspecified: Secondary | ICD-10-CM

## 2015-09-20 DIAGNOSIS — I2699 Other pulmonary embolism without acute cor pulmonale: Secondary | ICD-10-CM

## 2015-09-20 DIAGNOSIS — Z7901 Long term (current) use of anticoagulants: Secondary | ICD-10-CM

## 2015-09-20 LAB — POCT INR: INR: 4.5

## 2015-09-20 NOTE — Telephone Encounter (Signed)
Protime 54.4  INR 4.5

## 2015-09-20 NOTE — Telephone Encounter (Signed)
Called patient again but daughter still has not arrived.

## 2015-09-20 NOTE — Telephone Encounter (Signed)
supratherapeutic anticoagulation.  Current warfarin dose if 2mg  - take 1/2 tablet MWF and 1 tablet all other days. Recommend patient hold warfarin for next 2 days and then decrease dose to warfarin 2mg  - take 1/2 tablet (=1mg ) once a day.  Recheck INR in 5 -7 days.  Left message for Julie Carpenter on VM.  Tried to call pt's daughter but no answer.  I spoke with Julie Carpenter and she said her daughter would be there later today.  She will have her call me when she gets there.

## 2015-09-20 NOTE — Telephone Encounter (Signed)
I was not able to get in touch with patient's daughter but patient states she know which medication is her warfarin and she will not take tonight.  I will continue tomorrow to get in touch with daughter to discuss warfarin dosing changes.

## 2015-09-21 ENCOUNTER — Telehealth: Payer: Self-pay | Admitting: Pharmacist

## 2015-09-21 NOTE — Telephone Encounter (Signed)
Spoke with patient's daughter and new dosage communicated.  Patient's daughter understands.

## 2015-09-21 NOTE — Telephone Encounter (Signed)
Please see previous phone message from January 11th.  Patient's daughter given information about new warfarin dosage.

## 2015-09-27 ENCOUNTER — Telehealth: Payer: Self-pay | Admitting: *Deleted

## 2015-09-27 ENCOUNTER — Encounter (HOSPITAL_COMMUNITY)
Admission: RE | Admit: 2015-09-27 | Discharge: 2015-09-27 | Disposition: A | Discharge status: Home / Self Care | Payer: Medicare Other | Source: Ambulatory Visit | Attending: Nephrology | Admitting: Nephrology

## 2015-09-27 ENCOUNTER — Ambulatory Visit (INDEPENDENT_AMBULATORY_CARE_PROVIDER_SITE_OTHER): Payer: Medicare Other | Admitting: Pharmacist

## 2015-09-27 DIAGNOSIS — N184 Chronic kidney disease, stage 4 (severe): Secondary | ICD-10-CM | POA: Diagnosis present

## 2015-09-27 DIAGNOSIS — D638 Anemia in other chronic diseases classified elsewhere: Secondary | ICD-10-CM | POA: Insufficient documentation

## 2015-09-27 DIAGNOSIS — I482 Chronic atrial fibrillation, unspecified: Secondary | ICD-10-CM

## 2015-09-27 DIAGNOSIS — I2699 Other pulmonary embolism without acute cor pulmonale: Secondary | ICD-10-CM

## 2015-09-27 DIAGNOSIS — Z7901 Long term (current) use of anticoagulants: Secondary | ICD-10-CM

## 2015-09-27 LAB — HEMOGLOBIN AND HEMATOCRIT, BLOOD
HCT: 29.9 % — ABNORMAL LOW (ref 36.0–46.0)
Hemoglobin: 9.6 g/dL — ABNORMAL LOW (ref 12.0–15.0)

## 2015-09-27 LAB — POCT INR: INR: 1.9

## 2015-09-27 MED ORDER — EPOETIN ALFA 20000 UNIT/ML IJ SOLN
INTRAMUSCULAR | Status: AC
  Filled 2015-09-27: qty 1

## 2015-09-27 MED ORDER — EPOETIN ALFA 20000 UNIT/ML IJ SOLN
20000.0000 [IU] | INTRAMUSCULAR | Status: DC
  Administered 2015-09-27: 20000 [IU] via SUBCUTANEOUS

## 2015-09-27 NOTE — Telephone Encounter (Signed)
Slightly subtherapeutic anticoagulation.  Take 1 tablet of warfarin today, then continue 1/2 tablet once daily.  Recheck in 1 week. Patient, Julie Carpenter with [facility] both notified.  Tried to call daughter but NA and unable to LM.

## 2015-09-27 NOTE — Progress Notes (Signed)
Results for ALEYIAH, BRO (MRN [id number]) as of 09/27/2015 11:09  Ref. Range 09/27/2015 10:48  Hemoglobin Latest Ref Range: 12.0-15.0 g/dL 9.6 (L)  HCT Latest Ref Range: 36.0-46.0 % 29.9 (L)

## 2015-09-27 NOTE — Telephone Encounter (Signed)
Protime 22.5 INR 1.9

## 2015-10-04 ENCOUNTER — Ambulatory Visit (INDEPENDENT_AMBULATORY_CARE_PROVIDER_SITE_OTHER): Payer: Medicare Other | Admitting: Pharmacist

## 2015-10-04 ENCOUNTER — Telehealth: Payer: Self-pay | Admitting: *Deleted

## 2015-10-04 ENCOUNTER — Ambulatory Visit (INDEPENDENT_AMBULATORY_CARE_PROVIDER_SITE_OTHER): Payer: Medicare Other | Admitting: Pediatrics

## 2015-10-04 ENCOUNTER — Encounter: Payer: Self-pay | Admitting: Pediatrics

## 2015-10-04 VITALS — BP 107/70 | HR 82 | Temp 98.4°F | Ht 65.0 in | Wt 170.2 lb

## 2015-10-04 DIAGNOSIS — E119 Type 2 diabetes mellitus without complications: Secondary | ICD-10-CM

## 2015-10-04 DIAGNOSIS — I482 Chronic atrial fibrillation, unspecified: Secondary | ICD-10-CM

## 2015-10-04 DIAGNOSIS — I2699 Other pulmonary embolism without acute cor pulmonale: Secondary | ICD-10-CM

## 2015-10-04 DIAGNOSIS — N1832 Chronic kidney disease, stage 3b: Secondary | ICD-10-CM

## 2015-10-04 DIAGNOSIS — Z7901 Long term (current) use of anticoagulants: Secondary | ICD-10-CM

## 2015-10-04 DIAGNOSIS — N183 Chronic kidney disease, stage 3 (moderate): Secondary | ICD-10-CM

## 2015-10-04 DIAGNOSIS — I10 Essential (primary) hypertension: Secondary | ICD-10-CM | POA: Diagnosis not present

## 2015-10-04 LAB — POCT INR: INR: 2

## 2015-10-04 NOTE — Telephone Encounter (Signed)
INR: 2.0

## 2015-10-04 NOTE — Progress Notes (Signed)
Subjective:    Patient ID: Julie Carpenter, female    DOB: Oct 20, 1925, 82 y.o.   MRN: [id number]  CC: Follow-up multiple med problems  HPI: Julie Carpenter is a 82 y.o. female presenting for Follow-up  No heart racing Taking 1/2 tab of warfarin every day  DM2: BGLs have mostly been under 100 when checked around 930 in the morning, sometimes has had food sometimes not.   No bleeding. Trying to eat steady amount of greens.  No SOB, moving around fine at home  Depression screen Julie Carpenter 2/9 10/04/2015 09/06/2015 08/17/2015 07/24/2015 12/26/2014  Decreased Interest 0 0 0 0 0  Down, Depressed, Hopeless 0 0 0 0 0  PHQ - 2 Score 0 0 0 0 0     Relevant past medical, surgical, family and social history reviewed and updated as indicated. Interim medical history since our last visit reviewed. Allergies and medications reviewed and updated.   ROS: Per HPI unless specifically indicated above  History  Smoking status  . Never Smoker   Smokeless tobacco  . Not on file    Past Medical History Patient Active Problem List   Diagnosis Date Noted  . Other acute pulmonary embolism without acute cor pulmonale ([place]) 08/31/2015  . Long term (current) use of anticoagulants [Z79.01] 08/31/2015  . Acute renal failure superimposed on stage 4 chronic kidney disease ([place]) 08/28/2015  . Pulmonary embolism ([place]) 08/23/2015  . Shortness of breath 08/22/2015  . Diabetes mellitus with complication ([place])   . AKI (acute kidney injury) ([place])   . Dyspnea 08/17/2015  . DOE (dyspnea on exertion) 08/17/2015  . GERD (gastroesophageal reflux disease) 02/23/2014  . Peripheral edema 02/23/2014  . GI bleed 04/05/2013  . Atrial fibrillation ([place]) 02/24/2013  . HTN (hypertension) 12/26/2010  . Chronic kidney disease (CKD) stage G3b/A2, moderately decreased glomerular filtration rate (GFR) between 30-44 mL/min/1.73 square meter and albuminuria creatinine ratio between 30-299 mg/g 12/26/2010  . Hyperlipemia  12/26/2010  . Peripheral neuropathy ([place]) 12/26/2010  . Anemia 12/26/2010  . Diabetes ([place]) 12/26/2010  . Vertebral fracture 12/26/2010  . Venous insufficiency 12/26/2010  . Osteoporosis 12/26/2010  . Thyromegaly 12/26/2010    Current Outpatient Prescriptions  Medication Sig Dispense Refill  . atorvastatin (LIPITOR) 40 MG tablet TAKE ONE (1) TABLET EACH DAY (Patient taking differently: Take 40 mg by mouth at bedtime. ) 90 tablet 1  . calcitRIOL (ROCALTROL) 0.25 MCG capsule Take 0.25 mcg by mouth 3 (three) times a week. Mon.,Wed.,Fri.  5  . carvedilol (COREG) 12.5 MG tablet Take 1 tablet (12.5 mg total) by mouth 2 (two) times daily with a meal. 60 tablet 0  . epoetin alfa (EPOGEN,PROCRIT) 2000 UNIT/ML injection Inject 2,000 Units into the vein every 21 ( twenty-one) days. Patient has procrit administered at [facility] short stay every 3 weeks    . fenofibrate (TRICOR) 145 MG tablet Take 1 tablet (145 mg total) by mouth daily. 90 tablet 1  . furosemide (LASIX) 20 MG tablet Take 1 tablet (20 mg total) by mouth daily. 90 tablet 1  . glimepiride (AMARYL) 2 MG tablet Take 1.5 tablets (3 mg total) by mouth daily before breakfast. 135 tablet 1  . warfarin (COUMADIN) 2 MG tablet Take 1 tablet (2 mg total) by mouth daily. 30 tablet 1   No current facility-administered medications for this visit.       Objective:    BP 107/70 mmHg  Pulse 82  Temp(Src) 98.4 F (36.9 C) (Oral)  Ht 5'  5" (1.651 m)  Wt 170 lb 3.2 oz (77.202 kg)  BMI 28.32 kg/m2  Wt Readings from Last 3 Encounters:  10/04/15 170 lb 3.2 oz (77.202 kg)  09/06/15 164 lb 12.8 oz (74.753 kg)  09/05/15 166 lb (75.297 kg)     Gen: NAD, alert, cooperative with exam, NCAT EYES: EOMI, no scleral injection or icterus ENT:   OP without erythema CV: NRRR, normal S1/S2, no murmur, distal pulses 2+ b/l Resp: CTABL, no wheezes, normal WOB Abd: +BS, soft, NTND. Ext: No edema, warm Neuro: Alert and oriented, strength equal b/l UE and  LE, coordination grossly normal MSK: normal muscle bulk     Assessment & Plan:    Julie Carpenter was seen today for follow-up multiple med problems.  Diagnoses and all orders for this visit:  Essential hypertension Well controlled today, on the low side. Denies lightheadedness, dizziness. Will cont to monitor. On carvedilol, furosemide alone.  Chronic atrial fibrillation (HCC) On carvedilol 12.5mg  BID, no other BP meds. HR in 80s today.   Diabetes mellitus due to underlying condition with hyperosmolarity and coma, with long-term current use of insulin (HCC) Does have small amount protein in urine, pt not on ACEi or ARB due to allergies.  -     Microalbumin/Creatinine Ratio, Urine   Follow up plan: As scheduled for INR check  Julie Found, MD [facility] 10/04/2015, 2:19 PM

## 2015-10-04 NOTE — Telephone Encounter (Signed)
Therapeutic anticoagulation.  Patient in instructed to continue current warfarin 2mg  dose - take 1 tablet on Wednesdays and 1/2 tablet all other days.  Julie Carpenter was notified and will recheck INR next time she visits 10/18/15

## 2015-10-05 LAB — MICROALBUMIN / CREATININE URINE RATIO
Creatinine, Urine: 66.9 mg/dL
MICROALB/CREAT RATIO: 231.5 mg/g{creat} — ABNORMAL HIGH (ref 0.0–30.0)
Microalbumin, Urine: 154.9 ug/mL

## 2015-10-10 ENCOUNTER — Encounter (INDEPENDENT_AMBULATORY_CARE_PROVIDER_SITE_OTHER): Payer: Medicare Other | Admitting: Family Medicine

## 2015-10-10 DIAGNOSIS — I4891 Unspecified atrial fibrillation: Secondary | ICD-10-CM

## 2015-10-10 DIAGNOSIS — E1122 Type 2 diabetes mellitus with diabetic chronic kidney disease: Secondary | ICD-10-CM | POA: Diagnosis not present

## 2015-10-10 DIAGNOSIS — J9601 Acute respiratory failure with hypoxia: Secondary | ICD-10-CM

## 2015-10-10 DIAGNOSIS — I129 Hypertensive chronic kidney disease with stage 1 through stage 4 chronic kidney disease, or unspecified chronic kidney disease: Secondary | ICD-10-CM | POA: Diagnosis not present

## 2015-10-16 ENCOUNTER — Other Ambulatory Visit: Payer: Self-pay | Admitting: Pharmacist

## 2015-10-18 ENCOUNTER — Encounter (HOSPITAL_COMMUNITY)
Admission: RE | Admit: 2015-10-18 | Discharge: 2015-10-18 | Disposition: A | Discharge status: Home / Self Care | Payer: Medicare Other | Source: Ambulatory Visit | Attending: Nephrology | Admitting: Nephrology

## 2015-10-18 DIAGNOSIS — N184 Chronic kidney disease, stage 4 (severe): Secondary | ICD-10-CM | POA: Diagnosis present

## 2015-10-18 DIAGNOSIS — D638 Anemia in other chronic diseases classified elsewhere: Secondary | ICD-10-CM | POA: Diagnosis present

## 2015-10-18 LAB — HEMOGLOBIN AND HEMATOCRIT, BLOOD
HEMATOCRIT: 30.9 % — AB (ref 36.0–46.0)
HEMOGLOBIN: 9.8 g/dL — AB (ref 12.0–15.0)

## 2015-10-18 MED ORDER — EPOETIN ALFA 20000 UNIT/ML IJ SOLN
INTRAMUSCULAR | Status: AC
  Filled 2015-10-18: qty 1

## 2015-10-18 MED ORDER — EPOETIN ALFA 20000 UNIT/ML IJ SOLN
20000.0000 [IU] | INTRAMUSCULAR | Status: DC
  Administered 2015-10-18: 20000 [IU] via SUBCUTANEOUS

## 2015-10-19 ENCOUNTER — Telehealth: Payer: Self-pay | Admitting: *Deleted

## 2015-10-19 ENCOUNTER — Ambulatory Visit (INDEPENDENT_AMBULATORY_CARE_PROVIDER_SITE_OTHER): Payer: Medicare Other | Admitting: Pharmacist

## 2015-10-19 DIAGNOSIS — Z7901 Long term (current) use of anticoagulants: Secondary | ICD-10-CM | POA: Diagnosis not present

## 2015-10-19 DIAGNOSIS — I2699 Other pulmonary embolism without acute cor pulmonale: Secondary | ICD-10-CM | POA: Diagnosis not present

## 2015-10-19 DIAGNOSIS — I482 Chronic atrial fibrillation, unspecified: Secondary | ICD-10-CM

## 2015-10-19 LAB — POCT INR: INR: 3

## 2015-10-19 NOTE — Telephone Encounter (Signed)
Protime 36.2 INR 3.0. Advanced discharged patient today. Call daughter with new instructions and recheck time

## 2015-10-19 NOTE — Telephone Encounter (Signed)
Therapeutic anticoagulation but increased from 2.0 to 3.0 over last 2 weeks. Recommend decreased dose to warfarni 2mg  - take 1/2 tablet daily.  Recheck at next appt 11/06/15

## 2015-10-19 NOTE — Progress Notes (Signed)
Patient's INR checked at home by home health nurse.   Billing once per month interupertation fee.  Patient diagnosis - atrial fibrillation Procedure code if G0250

## 2015-10-24 ENCOUNTER — Inpatient Hospital Stay (HOSPITAL_COMMUNITY)
Admission: EM | Admit: 2015-10-24 | Discharge: 2015-11-01 | DRG: 638 | Disposition: A | Discharge status: Other Acute Inpatient Hospital | Payer: Medicare Other | Attending: Internal Medicine | Admitting: Internal Medicine

## 2015-10-24 ENCOUNTER — Encounter (HOSPITAL_COMMUNITY): Payer: Self-pay | Admitting: Emergency Medicine

## 2015-10-24 DIAGNOSIS — Z7901 Long term (current) use of anticoagulants: Secondary | ICD-10-CM

## 2015-10-24 DIAGNOSIS — E11649 Type 2 diabetes mellitus with hypoglycemia without coma: Principal | ICD-10-CM | POA: Diagnosis present

## 2015-10-24 DIAGNOSIS — F32A Depression, unspecified: Secondary | ICD-10-CM | POA: Diagnosis present

## 2015-10-24 DIAGNOSIS — R410 Disorientation, unspecified: Secondary | ICD-10-CM | POA: Diagnosis not present

## 2015-10-24 DIAGNOSIS — Z66 Do not resuscitate: Secondary | ICD-10-CM | POA: Diagnosis present

## 2015-10-24 DIAGNOSIS — F03918 Unspecified dementia, unspecified severity, with other behavioral disturbance: Secondary | ICD-10-CM | POA: Diagnosis present

## 2015-10-24 DIAGNOSIS — Z86711 Personal history of pulmonary embolism: Secondary | ICD-10-CM

## 2015-10-24 DIAGNOSIS — F0391 Unspecified dementia with behavioral disturbance: Secondary | ICD-10-CM | POA: Diagnosis present

## 2015-10-24 DIAGNOSIS — I481 Persistent atrial fibrillation: Secondary | ICD-10-CM

## 2015-10-24 DIAGNOSIS — N184 Chronic kidney disease, stage 4 (severe): Secondary | ICD-10-CM | POA: Diagnosis present

## 2015-10-24 DIAGNOSIS — I4891 Unspecified atrial fibrillation: Secondary | ICD-10-CM | POA: Diagnosis present

## 2015-10-24 DIAGNOSIS — I2699 Other pulmonary embolism without acute cor pulmonale: Secondary | ICD-10-CM | POA: Diagnosis present

## 2015-10-24 DIAGNOSIS — I10 Essential (primary) hypertension: Secondary | ICD-10-CM | POA: Diagnosis present

## 2015-10-24 DIAGNOSIS — I129 Hypertensive chronic kidney disease with stage 1 through stage 4 chronic kidney disease, or unspecified chronic kidney disease: Secondary | ICD-10-CM | POA: Diagnosis present

## 2015-10-24 DIAGNOSIS — F332 Major depressive disorder, recurrent severe without psychotic features: Secondary | ICD-10-CM | POA: Diagnosis present

## 2015-10-24 DIAGNOSIS — T50902A Poisoning by unspecified drugs, medicaments and biological substances, intentional self-harm, initial encounter: Secondary | ICD-10-CM | POA: Diagnosis present

## 2015-10-24 DIAGNOSIS — E0811 Diabetes mellitus due to underlying condition with ketoacidosis with coma: Secondary | ICD-10-CM

## 2015-10-24 DIAGNOSIS — E785 Hyperlipidemia, unspecified: Secondary | ICD-10-CM | POA: Diagnosis present

## 2015-10-24 DIAGNOSIS — T50904A Poisoning by unspecified drugs, medicaments and biological substances, undetermined, initial encounter: Secondary | ICD-10-CM

## 2015-10-24 DIAGNOSIS — E876 Hypokalemia: Secondary | ICD-10-CM | POA: Diagnosis present

## 2015-10-24 DIAGNOSIS — E119 Type 2 diabetes mellitus without complications: Secondary | ICD-10-CM

## 2015-10-24 DIAGNOSIS — F329 Major depressive disorder, single episode, unspecified: Secondary | ICD-10-CM | POA: Diagnosis present

## 2015-10-24 DIAGNOSIS — T43012A Poisoning by tricyclic antidepressants, intentional self-harm, initial encounter: Secondary | ICD-10-CM | POA: Diagnosis present

## 2015-10-24 DIAGNOSIS — E1122 Type 2 diabetes mellitus with diabetic chronic kidney disease: Secondary | ICD-10-CM | POA: Diagnosis present

## 2015-10-24 DIAGNOSIS — E162 Hypoglycemia, unspecified: Secondary | ICD-10-CM | POA: Diagnosis present

## 2015-10-24 HISTORY — DX: Acute embolism and thrombosis of unspecified deep veins of unspecified lower extremity: I82.409

## 2015-10-24 HISTORY — DX: Other pulmonary embolism without acute cor pulmonale: I26.99

## 2015-10-24 LAB — URINALYSIS, ROUTINE W REFLEX MICROSCOPIC
BILIRUBIN URINE: NEGATIVE
Glucose, UA: NEGATIVE mg/dL
Ketones, ur: NEGATIVE mg/dL
Leukocytes, UA: NEGATIVE
NITRITE: NEGATIVE
PROTEIN: NEGATIVE mg/dL
SPECIFIC GRAVITY, URINE: 1.01 (ref 1.005–1.030)
pH: 6 (ref 5.0–8.0)

## 2015-10-24 LAB — COMPREHENSIVE METABOLIC PANEL
ALBUMIN: 3.1 g/dL — AB (ref 3.5–5.0)
ALT: 46 U/L (ref 14–54)
ANION GAP: 8 (ref 5–15)
AST: 107 U/L — ABNORMAL HIGH (ref 15–41)
Alkaline Phosphatase: 46 U/L (ref 38–126)
BILIRUBIN TOTAL: 0.9 mg/dL (ref 0.3–1.2)
BUN: 31 mg/dL — ABNORMAL HIGH (ref 6–20)
CALCIUM: 8.9 mg/dL (ref 8.9–10.3)
CO2: 26 mmol/L (ref 22–32)
Chloride: 105 mmol/L (ref 101–111)
Creatinine, Ser: 2.2 mg/dL — ABNORMAL HIGH (ref 0.44–1.00)
GFR, EST AFRICAN AMERICAN: 22 mL/min — AB (ref >60)
GFR, EST NON AFRICAN AMERICAN: 19 mL/min — AB (ref >60)
GLUCOSE: 193 mg/dL — AB (ref 65–99)
POTASSIUM: 3.2 mmol/L — AB (ref 3.5–5.1)
Sodium: 139 mmol/L (ref 135–145)
TOTAL PROTEIN: 6.3 g/dL — AB (ref 6.5–8.1)

## 2015-10-24 LAB — CBC WITH DIFFERENTIAL/PLATELET
BASOS ABS: 0.1 10*3/uL (ref 0.0–0.1)
BASOS PCT: 1 %
Eosinophils Absolute: 0.1 10*3/uL (ref 0.0–0.7)
Eosinophils Relative: 2 %
HEMATOCRIT: 28.2 % — AB (ref 36.0–46.0)
HEMOGLOBIN: 9.1 g/dL — AB (ref 12.0–15.0)
LYMPHS PCT: 22 %
Lymphs Abs: 1 10*3/uL (ref 0.7–4.0)
MCH: 26.3 pg (ref 26.0–34.0)
MCHC: 32.3 g/dL (ref 30.0–36.0)
MCV: 81.5 fL (ref 78.0–100.0)
Monocytes Absolute: 0.4 10*3/uL (ref 0.1–1.0)
Monocytes Relative: 9 %
NEUTROS ABS: 2.9 10*3/uL (ref 1.7–7.7)
NEUTROS PCT: 66 %
Platelets: 312 10*3/uL (ref 150–400)
RBC: 3.46 MIL/uL — AB (ref 3.87–5.11)
RDW: 18.4 % — ABNORMAL HIGH (ref 11.5–15.5)
WBC: 4.4 10*3/uL (ref 4.0–10.5)

## 2015-10-24 LAB — RAPID URINE DRUG SCREEN, HOSP PERFORMED
Amphetamines: NOT DETECTED
BARBITURATES: NOT DETECTED
BENZODIAZEPINES: NOT DETECTED
COCAINE: NOT DETECTED
Opiates: NOT DETECTED
Tetrahydrocannabinol: NOT DETECTED

## 2015-10-24 LAB — ACETAMINOPHEN LEVEL

## 2015-10-24 LAB — URINE MICROSCOPIC-ADD ON

## 2015-10-24 LAB — GLUCOSE, CAPILLARY
GLUCOSE-CAPILLARY: 99 mg/dL (ref 65–99)
Glucose-Capillary: 111 mg/dL — ABNORMAL HIGH (ref 65–99)

## 2015-10-24 LAB — SALICYLATE LEVEL

## 2015-10-24 LAB — PROTIME-INR
INR: 3.35 — AB (ref 0.00–1.49)
Prothrombin Time: 33.2 seconds — ABNORMAL HIGH (ref 11.6–15.2)

## 2015-10-24 LAB — TSH: TSH: 0.415 u[IU]/mL (ref 0.350–4.500)

## 2015-10-24 MED ORDER — SODIUM CHLORIDE 0.9% FLUSH
3.0000 mL | Freq: Two times a day (BID) | INTRAVENOUS | Status: DC
  Administered 2015-10-24 – 2015-11-01 (×12): 3 mL via INTRAVENOUS

## 2015-10-24 MED ORDER — INSULIN ASPART 100 UNIT/ML ~~LOC~~ SOLN
0.0000 [IU] | SUBCUTANEOUS | Status: DC
  Administered 2015-10-25: 1 [IU] via SUBCUTANEOUS

## 2015-10-24 MED ORDER — CARVEDILOL 12.5 MG PO TABS
12.5000 mg | ORAL_TABLET | Freq: Two times a day (BID) | ORAL | Status: DC
  Administered 2015-10-24 – 2015-10-27 (×7): 12.5 mg via ORAL
  Filled 2015-10-24 (×8): qty 1

## 2015-10-24 MED ORDER — FENOFIBRATE 160 MG PO TABS
160.0000 mg | ORAL_TABLET | Freq: Every day | ORAL | Status: DC
  Administered 2015-10-24 – 2015-11-01 (×9): 160 mg via ORAL
  Filled 2015-10-24 (×10): qty 1

## 2015-10-24 MED ORDER — ATORVASTATIN CALCIUM 40 MG PO TABS
40.0000 mg | ORAL_TABLET | Freq: Every day | ORAL | Status: DC
  Administered 2015-10-24 – 2015-10-31 (×8): 40 mg via ORAL
  Filled 2015-10-24 (×8): qty 1

## 2015-10-24 MED ORDER — DEXTROSE-NACL 5-0.9 % IV SOLN
INTRAVENOUS | Status: DC
  Administered 2015-10-24: 20:00:00 via INTRAVENOUS

## 2015-10-24 MED ORDER — SODIUM CHLORIDE 0.9 % IV BOLUS (SEPSIS)
500.0000 mL | Freq: Once | INTRAVENOUS | Status: AC
  Administered 2015-10-24: 500 mL via INTRAVENOUS

## 2015-10-24 MED ORDER — POTASSIUM CHLORIDE CRYS ER 20 MEQ PO TBCR
40.0000 meq | EXTENDED_RELEASE_TABLET | Freq: Once | ORAL | Status: AC
  Administered 2015-10-24: 40 meq via ORAL
  Filled 2015-10-24: qty 2

## 2015-10-24 NOTE — Progress Notes (Signed)
ANTICOAGULATION CONSULT NOTE - Initial Consult  Pharmacy Consult for coumadin Indication: atrial fibrillation  Allergies  Allergen Reactions  . Penicillins Anaphylaxis    Has patient had a PCN reaction causing immediate rash, facial/tongue/throat swelling, SOB or lightheadedness with hypotension: Yes Has patient had a PCN reaction causing severe rash involving mucus membranes or skin necrosis: No Has patient had a PCN reaction that required hospitalization No Has patient had a PCN reaction occurring within the last 10 years: No If all of the above answers are "NO", then may proceed with Cephalosporin use.  Marland Kitchen Benicar [Olmesartan Medoxomil] Swelling    Lips swelling  . Ciprofloxacin Swelling  . Hctz [Hydrochlorothiazide] Swelling    Lips swelling  . Januvia [Sitagliptin Phosphate] Swelling    Lips swelling  . Norvasc [Amlodipine Besylate] Swelling    Lips swelling  . Ramipril Swelling    Lips swelling    Patient Measurements:    Vital Signs: Temp: 98.1 F (36.7 C) (02/14 1333) Temp Source: Oral (02/14 1333) BP: 105/83 mmHg (02/14 1830) Pulse Rate: 87 (02/14 1830)  Labs:  Recent Labs  10/24/15 1413  HGB 9.1*  HCT 28.2*  PLT 312  LABPROT 33.2*  INR 3.35*  CREATININE 2.20*    CrCl cannot be calculated (Unknown ideal weight.).   Medical History: Past Medical History  Diagnosis Date  . Hyperlipidemia   . Hypertension   . Diabetes mellitus without complication ([place])   . Atrial fibrillation (HCC)     Medications:  Prescriptions prior to admission  Medication Sig Dispense Refill Last Dose  . amitriptyline (ELAVIL) 50 MG tablet Take 50 mg by mouth at bedtime as needed for sleep.   10/24/2015 at Unknown time  . atorvastatin (LIPITOR) 40 MG tablet TAKE ONE (1) TABLET EACH DAY (Patient taking differently: Take 40 mg by mouth at bedtime. ) 90 tablet 1 10/23/2015 at Unknown time  . calcitRIOL (ROCALTROL) 0.25 MCG capsule Take 0.25 mcg by mouth 3 (three) times a week.  Mon.,Wed.,Fri.  5 10/23/2015 at Unknown time  . carvedilol (COREG) 12.5 MG tablet Take 1 tablet (12.5 mg total) by mouth 2 (two) times daily with a meal. 60 tablet 0 10/24/2015 at 930A  . epoetin alfa (EPOGEN,PROCRIT) 2000 UNIT/ML injection Inject 2,000 Units into the vein every 21 ( twenty-one) days. Patient has procrit administered at [facility] short stay every 3 weeks   10/18/2015 at Unknown time  . fenofibrate (TRICOR) 145 MG tablet Take 1 tablet (145 mg total) by mouth daily. 90 tablet 1 10/23/2015 at Unknown time  . furosemide (LASIX) 20 MG tablet Take 1 tablet (20 mg total) by mouth daily. (Patient taking differently: Take 20-40 mg by mouth daily. ) 90 tablet 1 10/24/2015 at Unknown time  . glimepiride (AMARYL) 2 MG tablet Take 1.5 tablets (3 mg total) by mouth daily before breakfast. (Patient taking differently: Take 2 mg by mouth daily before breakfast. ) 135 tablet 1 10/24/2015 at Unknown time  . warfarin (COUMADIN) 2 MG tablet TAKE ONE (1) TABLET EACH DAY (Patient taking differently: TAKE ONE-HALF TABLET BY MOUTH ONCE DAILY IN THE EVENING) 30 tablet 2 10/23/2015 at 1800    Assessment: 82 yo lady to continue coumadin for afib.  Last dose was 2/13.  INR 3.35 today Goal of Therapy:  INR 2-3 Monitor platelets by anticoagulation protocol: Yes   Plan:  No coumadin today. Daily PT/INR Monitor for bleeding complications  Excell Seltzer Poteet 10/24/2015,7:50 PM

## 2015-10-24 NOTE — H&P (Signed)
[facility] History and Physical  Julie Carpenter [id number] DOB: 02-06-1926    PCP:   Eustaquio Maize, MD   Chief Complaint: intentional overdose on Elavil.  HPI: Julie Carpenter is an 82 y.o. female whom I had taking care of previously with hx of CKD with baseline Cr about 1.5, hx of DM, HTN, HLD, afib with lower GI bleed unable to tolerate anticoagulation ( Colonsocopy with AVM and tics--Dr Fields-2014), sent in from her home as she was recently taken from her SNF to home, and she told her daughter she didn't want to live "like this anymore" and took about 16 tablets of Elavil 50mg .  In the ER, she became lethargic and confused, but is not tachycardic and able to maintain her hemodynamics and oral airway.  Work up in the ER showed afib with controlled rate, and QTc of 460ms, with K of 3.2, Cr of 2.2, and Hb of 9 grams per dL, not a marked change from baseline.  Her tylenol level was negative and Tox screen was negative.  Poison control center recommended 12 hours of monitoring.  Her daughter confirmed that she has been a DNR.  Hospitalist was asked to admit her for monitoring of TCA overdose and for psychiatric consultation.   Rewiew of Systems:   Unable.    Past Medical History  Diagnosis Date  . Hyperlipidemia   . Hypertension   . Diabetes mellitus without complication ([place])   . Atrial fibrillation [facility])     Past Surgical History  Procedure Laterality Date  . Abdominal hysterectomy    . Cholecystectomy    . Bladder tack    . Esophagogastroduodenoscopy N/A 04/06/2013    Procedure: ESOPHAGOGASTRODUODENOSCOPY (EGD);  Surgeon: Danie Binder, MD;  Location: [facility] ENDO SUITE;  Service: Endoscopy;  Laterality: N/A;  8:45  . Givens capsule study  04/06/2013    Procedure: GIVENS CAPSULE STUDY;  Surgeon: Danie Binder, MD;  Location: [facility] ENDO SUITE;  Service: Endoscopy;;  . Colonoscopy N/A 05/03/2013    [id number] MOST LIKELY DUE TO ONE AVM in the ascending colon/Moderate  diverticulosis hroughout the entire examined colon/Two POLYPS REMOVED/Small internal hemorrhoids    Medications:  HOME MEDS: Prior to Admission medications   Medication Sig Start Date End Date Taking? Authorizing Provider  amitriptyline (ELAVIL) 50 MG tablet Take 50 mg by mouth at bedtime as needed for sleep.   Yes Historical Provider, MD  atorvastatin (LIPITOR) 40 MG tablet TAKE ONE (1) TABLET EACH DAY Patient taking differently: Take 40 mg by mouth at bedtime.  07/24/15  Yes Mary-Margaret Hassell Done, FNP  calcitRIOL (ROCALTROL) 0.25 MCG capsule Take 0.25 mcg by mouth 3 (three) times a week. Mon.,Wed.,Fri. 07/11/15  Yes Historical Provider, MD  carvedilol (COREG) 12.5 MG tablet Take 1 tablet (12.5 mg total) by mouth 2 (two) times daily with a meal. 08/18/15  Yes Albertine Patricia, MD  epoetin alfa (EPOGEN,PROCRIT) 2000 UNIT/ML injection Inject 2,000 Units into the vein every 21 ( twenty-one) days. Patient has procrit administered at [facility] short stay every 3 weeks   Yes Historical Provider, MD  fenofibrate (TRICOR) 145 MG tablet Take 1 tablet (145 mg total) by mouth daily. 07/24/15  Yes Mary-Margaret Hassell Done, FNP  furosemide (LASIX) 20 MG tablet Take 1 tablet (20 mg total) by mouth daily. Patient taking differently: Take 20-40 mg by mouth daily.  07/24/15  Yes Mary-Margaret Hassell Done, FNP  glimepiride (AMARYL) 2 MG tablet Take 1.5 tablets (3 mg total) by mouth daily before breakfast.  Patient taking differently: Take 2 mg by mouth daily before breakfast.  08/10/15  Yes Tammy Eckard, PHARMD  warfarin (COUMADIN) 2 MG tablet TAKE ONE (1) TABLET EACH DAY Patient taking differently: TAKE ONE-HALF TABLET BY MOUTH ONCE DAILY IN THE EVENING 10/16/15  Yes Eustaquio Maize, MD     Allergies:  Allergies  Allergen Reactions  . Penicillins Anaphylaxis    Has patient had a PCN reaction causing immediate rash, facial/tongue/throat swelling, SOB or lightheadedness with hypotension: Yes Has patient had a PCN  reaction causing severe rash involving mucus membranes or skin necrosis: No Has patient had a PCN reaction that required hospitalization No Has patient had a PCN reaction occurring within the last 10 years: No If all of the above answers are "NO", then may proceed with Cephalosporin use.  Marland Kitchen Benicar [Olmesartan Medoxomil] Swelling    Lips swelling  . Ciprofloxacin Swelling  . Hctz [Hydrochlorothiazide] Swelling    Lips swelling  . Januvia [Sitagliptin Phosphate] Swelling    Lips swelling  . Norvasc [Amlodipine Besylate] Swelling    Lips swelling  . Ramipril Swelling    Lips swelling    Social History:   reports that she has never smoked. She does not have any smokeless tobacco history on file. She reports that she does not drink alcohol or use illicit drugs.  Family History: Family History  Problem Relation Age of Onset  . Colon cancer Neg Hx      Physical Exam: Filed Vitals:   10/24/15 1600 10/24/15 1700 10/24/15 1730 10/24/15 1800  BP: 112/84 102/79 118/83 107/69  Pulse: 89 86 84 92  Temp:      TempSrc:      Resp: 18 20 16 26   SpO2: 99% 100% 100% 98%   Blood pressure 107/69, pulse 92, temperature 98.1 F (36.7 C), temperature source Oral, resp. rate 26, SpO2 98 %.  GEN:  Pleasant  patient lying in the stretcher in no acute distress; cooperative with exam. PSYCH:  alert and but not orient, and is confused. does not appear anxious  HEENT: Mucous membranes pink and anicteric; PERRLA; EOM intact; no cervical lymphadenopathy nor thyromegaly or carotid bruit; no JVD; There were no stridor. Neck is very supple. Breasts:: Not examined CHEST WALL: No tenderness CHEST: Normal respiration, clear to auscultation bilaterally.  HEART: Regular rate and rhythm.  There are no murmur, rub, or gallops.   BACK: No kyphosis or scoliosis; no CVA tenderness ABDOMEN: soft and non-tender; no masses, no organomegaly, normal abdominal bowel sounds; no pannus; no intertriginous candida. There is  no rebound and no distention. Rectal Exam: Not done EXTREMITIES: No bone or joint deformity; age-appropriate arthropathy of the hands and knees; no edema; no ulcerations.  There is no calf tenderness. Genitalia: not examined PULSES: 2+ and symmetric SKIN: Normal hydration no rash or ulceration CNS: Cranial nerves 2-12 grossly intact no focal lateralizing neurologic deficit.  Speech is fluent; uvula elevated with phonation, facial symmetry and tongue midline. DTR are normal bilaterally, cerebella exam is intact, barbinski is negative and strengths are equaled bilaterally.  No sensory loss.   Labs on Admission:  Basic Metabolic Panel:  Recent Labs Lab 10/24/15 1413  NA 139  K 3.2*  CL 105  CO2 26  GLUCOSE 193*  BUN 31*  CREATININE 2.20*  CALCIUM 8.9   Liver Function Tests:  Recent Labs Lab 10/24/15 1413  AST 107*  ALT 46  ALKPHOS 46  BILITOT 0.9  PROT 6.3*  ALBUMIN 3.1*  CBC:  Recent Labs Lab 10/18/15 1050 10/24/15 1413  WBC  --  4.4  NEUTROABS  --  2.9  HGB 9.8* 9.1*  HCT 30.9* 28.2*  MCV  --  81.5  PLT  --  312   Assessment/Plan Present on Admission:  . Suicidal overdose ([place]) . Atrial fibrillation ([place]) . HTN (hypertension) . Pulmonary embolism ([place]) . Depression  PLAN:  Suicidal attempt with TCA overdose:  Will continue with monitoring, and give IVF.  She is a little lethargic so we will hold off on charcoal.   She will be admitted to telemetry and continue with her DNR code status.  I will continue with her anticoagulation for her hx of afib and PE.  In the am, once her mental status improves, will obtain telepsychiatric consultation for possible inpatient Tx of her severe depression and suicidal attempt.  She will need 1:1 sitter, and she cannot leave AMA until cleared by psychiatry.  She doesn't need IVC at this time.  Her daugher agreed with tx plans and has been at her bedside.   I will hold her coreg at this time.  She is stable and will be  admitted to [facility] service.  Thank you and Good day.   Other plans as per orders.  Code Status: DNR.    Orvan Falconer, MD. FACP [facility] Pager [phone] 7pm to 7am.  10/24/2015, 6:13 PM

## 2015-10-24 NOTE — ED Notes (Addendum)
PT had a bottle of Amitriptyline 50mg  tabs filled on 07/11/15 and daughter states she gave it to her nightly x3 weeks (21 tablets). PT stated she took a lot of the amitriptyline pills today at 1200 and stated she just wanted to fill better and family reports dementia. 53 tablets are left in the bottle so possible 16 (50mg ) pills ingested. PT denies any SI/HI.

## 2015-10-24 NOTE — ED Provider Notes (Addendum)
CSN: [id number]     Arrival date & time 10/24/15  1328 History   First MD Initiated Contact with Patient 10/24/15 1342     Chief Complaint  Patient presents with  . Ingestion     (Consider location/radiation/quality/duration/timing/severity/associated sxs/prior Treatment) HPI.....Marland Kitchen level V caveat for mild dementia. Most history obtained from daughter. Patient allegedly took several amitriptyline 50 mg tablets approximately noon today. Her daughter states she has been lonely lately and has had some questionable suicidal ideation. Daughter is uncertain about the exact number of pills taken, but she believes it's in the vicinity of 16 tablets.  No prodromal illnesses. She is sleepy.  Past Medical History  Diagnosis Date  . Hyperlipidemia   . Hypertension   . Diabetes mellitus without complication ([place])   . Atrial fibrillation [facility])    Past Surgical History  Procedure Laterality Date  . Abdominal hysterectomy    . Cholecystectomy    . Bladder tack    . Esophagogastroduodenoscopy N/A 04/06/2013    Procedure: ESOPHAGOGASTRODUODENOSCOPY (EGD);  Surgeon: Danie Binder, MD;  [facility];  Service: Endoscopy;  Laterality: N/A;  8:45  . Givens capsule study  04/06/2013    Procedure: GIVENS CAPSULE STUDY;  Surgeon: Danie Binder, MD;  Location: [facility];  Service: Endoscopy;;  . Colonoscopy N/A 05/03/2013    [id number] MOST LIKELY DUE TO ONE AVM in the ascending colon/Moderate diverticulosis hroughout the entire examined colon/Two POLYPS REMOVED/Small internal hemorrhoids   Family History  Problem Relation Age of Onset  . Colon cancer Neg Hx    Social History  Substance Use Topics  . Smoking status: Never Smoker   . Smokeless tobacco: None  . Alcohol Use: No   OB History    No data available     Review of Systems  Reason unable to perform ROS: Dementia.      Allergies  Penicillins; Benicar; Ciprofloxacin; Hctz; Januvia; Norvasc; and Ramipril  Home Medications    Prior to Admission medications   Medication Sig Start Date End Date Taking? Authorizing Provider  amitriptyline (ELAVIL) 50 MG tablet Take 50 mg by mouth at bedtime as needed for sleep.   Yes Historical Provider, MD  atorvastatin (LIPITOR) 40 MG tablet TAKE ONE (1) TABLET EACH DAY Patient taking differently: Take 40 mg by mouth at bedtime.  07/24/15  Yes Mary-Margaret Hassell Done, FNP  calcitRIOL (ROCALTROL) 0.25 MCG capsule Take 0.25 mcg by mouth 3 (three) times a week. Mon.,Wed.,Fri. 07/11/15  Yes Historical Provider, MD  carvedilol (COREG) 12.5 MG tablet Take 1 tablet (12.5 mg total) by mouth 2 (two) times daily with a meal. 08/18/15  Yes Albertine Patricia, MD  epoetin alfa (EPOGEN,PROCRIT) 2000 UNIT/ML injection Inject 2,000 Units into the vein every 21 ( twenty-one) days. Patient has procrit administered at [facility] short stay every 3 weeks   Yes Historical Provider, MD  fenofibrate (TRICOR) 145 MG tablet Take 1 tablet (145 mg total) by mouth daily. 07/24/15  Yes Mary-Margaret Hassell Done, FNP  furosemide (LASIX) 20 MG tablet Take 1 tablet (20 mg total) by mouth daily. Patient taking differently: Take 20-40 mg by mouth daily.  07/24/15  Yes Mary-Margaret Hassell Done, FNP  glimepiride (AMARYL) 2 MG tablet Take 1.5 tablets (3 mg total) by mouth daily before breakfast. Patient taking differently: Take 2 mg by mouth daily before breakfast.  08/10/15  Yes Tammy Eckard, PHARMD  warfarin (COUMADIN) 2 MG tablet TAKE ONE (1) TABLET EACH DAY Patient taking differently: TAKE ONE-HALF TABLET BY MOUTH  ONCE DAILY IN THE EVENING 10/16/15  Yes Eustaquio Maize, MD   BP 102/79 mmHg  Pulse 86  Temp(Src) 98.1 F (36.7 C) (Oral)  Resp 20  SpO2 100% Physical Exam  Constitutional:  Patient is slurring words.  HENT:  Head: Normocephalic and atraumatic.  Eyes: Conjunctivae and EOM are normal. Pupils are equal, round, and reactive to light.  Neck: Normal range of motion. Neck supple.  Cardiovascular: Normal rate and  regular rhythm.   Pulmonary/Chest: Effort normal and breath sounds normal.  Abdominal: Soft. Bowel sounds are normal.  Musculoskeletal: Normal range of motion.  Neurological:  Nose name and place but not time.    Skin: Skin is warm and dry.  Psychiatric: She has a normal mood and affect. Her behavior is normal.  Nursing note and vitals reviewed.   ED Course  Procedures (including critical care time) Labs Review Labs Reviewed  COMPREHENSIVE METABOLIC PANEL - Abnormal; Notable for the following:    Potassium 3.2 (*)    Glucose, Bld 193 (*)    BUN 31 (*)    Creatinine, Ser 2.20 (*)    Total Protein 6.3 (*)    Albumin 3.1 (*)    AST 107 (*)    GFR calc non Af Amer 19 (*)    GFR calc Af Amer 22 (*)    All other components within normal limits  CBC WITH DIFFERENTIAL/PLATELET - Abnormal; Notable for the following:    RBC 3.46 (*)    Hemoglobin 9.1 (*)    HCT 28.2 (*)    RDW 18.4 (*)    All other components within normal limits  PROTIME-INR - Abnormal; Notable for the following:    Prothrombin Time 33.2 (*)    INR 3.35 (*)    All other components within normal limits  ACETAMINOPHEN LEVEL - Abnormal; Notable for the following:    Acetaminophen (Tylenol), Serum <10 (*)    All other components within normal limits  SALICYLATE LEVEL  URINE RAPID DRUG SCREEN, HOSP PERFORMED  URINALYSIS, ROUTINE W REFLEX MICROSCOPIC (NOT AT [facility])    Imaging Review No results found. I have personally reviewed and evaluated these images and lab results as part of my medical decision-making.   EKG Interpretation   Date/Time:  Tuesday October 24 2015 13:38:21 EST Ventricular Rate:  92 PR Interval:    QRS Duration: 82 QT Interval:  358 QTC Calculation: 442 R Axis:   22 Text Interpretation:  Atrial fibrillation Low voltage QRS Abnormal ECG  Confirmed by Ellison Leisure  MD, Erice Ahles ([id number]) on 10/24/2015 1:43:41 PM     CRITICAL CARE Performed by: Nat Christen Total critical care time: 30 minutes Critical  care time was exclusive of separately billable procedures and treating other patients. Critical care was necessary to treat or prevent imminent or life-threatening deterioration. Critical care was time spent personally by me on the following activities: development of treatment plan with patient and/or surrogate as well as nursing, discussions with consultants, evaluation of patient's response to treatment, examination of patient, obtaining history from patient or surrogate, ordering and performing treatments and interventions, ordering and review of laboratory studies, ordering and review of radiographic studies, pulse oximetry and re-evaluation of patient's condition. MDM   Final diagnoses:  Overdose, undetermined intent, initial encounter    Patient took undetermined amount of amitriptyline. Her daughter says she has been depressed lately. Her vital signs are stable. Poison control notified. I was reluctant to administer activated charcoal secondary to patient's dementia and sleepiness. I was concerned  about aspiration. Recommend observation for minimum of 12 hours. Will admit to general medicine.    Nat Christen, MD 10/24/15 [place], MD 10/24/15 [phone]

## 2015-10-24 NOTE — ED Notes (Signed)
EKG completed at 1338

## 2015-10-24 NOTE — ED Notes (Signed)
Attempted report x1. 

## 2015-10-24 NOTE — ED Notes (Signed)
Poison Control called and spoke with Antonieta Pert and posion control recommended EKG now, EKG in 6 hours, activated charcoal, basic lab panels. EDP made aware of recommendations.

## 2015-10-24 NOTE — ED Notes (Signed)
Spoke with Neoma Laming from [employer]. Updated on patient's status. Suggests to repeat EKG.

## 2015-10-24 NOTE — ED Notes (Signed)
Dr Cook at bedside

## 2015-10-24 NOTE — ED Notes (Signed)
Dr Le at bedside.  

## 2015-10-24 NOTE — ED Notes (Signed)
Phlebotomy at bedside.

## 2015-10-25 ENCOUNTER — Ambulatory Visit: Payer: Medicare Other | Admitting: Pediatrics

## 2015-10-25 DIAGNOSIS — F332 Major depressive disorder, recurrent severe without psychotic features: Secondary | ICD-10-CM | POA: Diagnosis not present

## 2015-10-25 DIAGNOSIS — T50902A Poisoning by unspecified drugs, medicaments and biological substances, intentional self-harm, initial encounter: Secondary | ICD-10-CM | POA: Diagnosis not present

## 2015-10-25 DIAGNOSIS — I481 Persistent atrial fibrillation: Secondary | ICD-10-CM | POA: Diagnosis not present

## 2015-10-25 DIAGNOSIS — N184 Chronic kidney disease, stage 4 (severe): Secondary | ICD-10-CM | POA: Diagnosis not present

## 2015-10-25 LAB — GLUCOSE, CAPILLARY
GLUCOSE-CAPILLARY: 109 mg/dL — AB (ref 65–99)
GLUCOSE-CAPILLARY: 167 mg/dL — AB (ref 65–99)
Glucose-Capillary: 124 mg/dL — ABNORMAL HIGH (ref 65–99)
Glucose-Capillary: 151 mg/dL — ABNORMAL HIGH (ref 65–99)
Glucose-Capillary: 126 mg/dL — ABNORMAL HIGH (ref 65–99)

## 2015-10-25 LAB — PROTIME-INR
INR: 3.79 — ABNORMAL HIGH (ref 0.00–1.49)
Prothrombin Time: 36.5 seconds — ABNORMAL HIGH (ref 11.6–15.2)

## 2015-10-25 MED ORDER — INSULIN ASPART 100 UNIT/ML ~~LOC~~ SOLN
0.0000 [IU] | Freq: Three times a day (TID) | SUBCUTANEOUS | Status: DC
  Administered 2015-10-25: 2 [IU] via SUBCUTANEOUS
  Administered 2015-10-25: 1 [IU] via SUBCUTANEOUS

## 2015-10-25 MED ORDER — WARFARIN - PHARMACIST DOSING INPATIENT
Status: DC
  Administered 2015-10-31: 16:00:00

## 2015-10-25 NOTE — Progress Notes (Signed)
Arbie Cookey at [facility] inquired if patient still needs placement and informed writer that referral is being reviewed by MD.  CSW will continue to follow up with placement efforts.  Verlon Setting, [place] Disposition staff 10/25/2015 4:14 PM

## 2015-10-25 NOTE — Progress Notes (Signed)
Psychiatry/TTS has recommended pt be referred for inpatient gero-psychiatric treatment due to OD.   Under review: [place]- per Nunzio Cory (referral included EKG results- may also request chest x-ray) Rosana Hoes- per [facility]- per [facility] - per [facility] (referral sent to the Call Center in [place]. Rose states referral will be reviewed for [facility] geriatric unit)  Left voicemail with [facility] and will refer if appropriate.  Sharren Bridge, MSW, LCSW Clinical Social Work, Disposition  10/25/2015 [phone]

## 2015-10-25 NOTE — Care Management Note (Signed)
Case Management Note  Patient Details  Name: Julie Carpenter MRN: [id number] Date of Birth: 07-11-1926  Expected Discharge Date:                  Expected Discharge Plan:  [facility]  In-House Referral:  NA  Discharge planning Services  CM Consult  Post Acute Care Choice:  NA Choice offered to:  NA  DME Arranged:    DME Agency:     HH Arranged:    [place] Agency:     Status of Service:  In process, will continue to follow  Medicare Important Message Given:    Date Medicare IM Given:    Medicare IM give by:    Date Additional Medicare IM Given:    Additional Medicare Important Message give by:     If discussed at [place] of Stay Meetings, dates discussed:    Additional Comments: TTS has recommended inpt psych. Bed search underway by disposition SW.   Sherald Barge, RN 10/25/2015, 4:07 PM

## 2015-10-25 NOTE — Care Management Note (Signed)
Case Management Note  Patient Details  Name: KIMI MOCHIZUKI MRN: [id number] Date of Birth: 1926-03-11  Subjective/Objective:                  Pt admitted after suicidal overdose. Pt is from home, lives alone at baseline but has been staying with daughter the past few days. Pt has two daughters who provide support as needed. Pt asked if taking the pills was intentional and she says she thought they would make her "feel better". Pt has no HH or DME prior to admission. CM spoke with daughters at length who would like their mother placed in either ALF or SNF. Explained to family that pt does not have need for SNF and should would be appropriate for ALF. Pt does not have medicaid, explained to family that they ALF would have to be paid for OOP. Family given list of ALF's and told they would need to make contact to determine costs and their preferences. Explained to family that pt could no be kept in hospital only for placement, if she was cleared by TTS she would be discharged. Family hesitant to take pt home as they feel they are unable to care for her. Family given encouragement. If TTS clears patient and pt is discharged before ALF placement can be made, HH will be arranged to assist with placement from home.   Action/Plan: TTS consult pending. Will cont discussions about DC planning with pt/family after TSS recommendation is made.    Expected Discharge Date:    10/26/2015              Expected Discharge Plan:  Home/Self Care  In-House Referral:  NA  Discharge planning Services  CM Consult  Post Acute Care Choice:  NA Choice offered to:  NA  DME Arranged:    DME Agency:     HH Arranged:    HH Agency:     Status of Service:  In process, will continue to follow  Medicare Important Message Given:    Date Medicare IM Given:    Medicare IM give by:    Date Additional Medicare IM Given:    Additional Medicare Important Message give by:     If discussed at [place] of Stay Meetings,  dates discussed:    Additional Comments:  Sherald Barge, RN 10/25/2015, 11:49 AM

## 2015-10-25 NOTE — Progress Notes (Signed)
[place] for coumadin Indication: atrial fibrillation  Allergies  Allergen Reactions  . Penicillins Anaphylaxis    Has patient had a PCN reaction causing immediate rash, facial/tongue/throat swelling, SOB or lightheadedness with hypotension: Yes Has patient had a PCN reaction causing severe rash involving mucus membranes or skin necrosis: No Has patient had a PCN reaction that required hospitalization No Has patient had a PCN reaction occurring within the last 10 years: No If all of the above answers are "NO", then may proceed with Cephalosporin use.  Marland Kitchen Benicar [Olmesartan Medoxomil] Swelling    Lips swelling  . Ciprofloxacin Swelling  . Hctz [Hydrochlorothiazide] Swelling    Lips swelling  . Januvia [Sitagliptin Phosphate] Swelling    Lips swelling  . Norvasc [Amlodipine Besylate] Swelling    Lips swelling  . Ramipril Swelling    Lips swelling    Patient Measurements: Height: 5\' 4"  (162.6 cm) Weight: 159 lb (72.122 kg) IBW/kg (Calculated) : 54.7  Vital Signs: Temp: 97.4 F (36.3 C) (02/15 0310) Temp Source: Oral (02/15 0310) BP: 111/96 mmHg (02/15 0310) Pulse Rate: 83 (02/15 0310)  Labs:  Recent Labs  10/24/15 1413 10/25/15 0726  HGB 9.1*  --   HCT 28.2*  --   PLT 312  --   LABPROT 33.2* 36.5*  INR 3.35* 3.79*  CREATININE 2.20*  --     Estimated Creatinine Clearance: 17.5 mL/min (by C-G formula based on Cr of 2.2).   Medical History: Past Medical History  Diagnosis Date  . Hyperlipidemia   . Hypertension   . Diabetes mellitus without complication ([place])   . Atrial fibrillation ([facility])     Medications:  Prescriptions prior to admission  Medication Sig Dispense Refill Last Dose  . amitriptyline (ELAVIL) 50 MG tablet Take 50 mg by mouth at bedtime as needed for sleep.   10/24/2015 at Unknown time  . atorvastatin (LIPITOR) 40 MG tablet TAKE ONE (1) TABLET EACH DAY (Patient taking differently: Take 40 mg by mouth at  bedtime. ) 90 tablet 1 10/23/2015 at Unknown time  . calcitRIOL (ROCALTROL) 0.25 MCG capsule Take 0.25 mcg by mouth 3 (three) times a week. Mon.,Wed.,Fri.  5 10/23/2015 at Unknown time  . carvedilol (COREG) 12.5 MG tablet Take 1 tablet (12.5 mg total) by mouth 2 (two) times daily with a meal. 60 tablet 0 10/24/2015 at 930A  . epoetin alfa (EPOGEN,PROCRIT) 2000 UNIT/ML injection Inject 2,000 Units into the vein every 21 ( twenty-one) days. Patient has procrit administered at [facility] short stay every 3 weeks   10/18/2015 at Unknown time  . fenofibrate (TRICOR) 145 MG tablet Take 1 tablet (145 mg total) by mouth daily. 90 tablet 1 10/23/2015 at Unknown time  . furosemide (LASIX) 20 MG tablet Take 1 tablet (20 mg total) by mouth daily. (Patient taking differently: Take 20-40 mg by mouth daily. ) 90 tablet 1 10/24/2015 at Unknown time  . glimepiride (AMARYL) 2 MG tablet Take 1.5 tablets (3 mg total) by mouth daily before breakfast. (Patient taking differently: Take 2 mg by mouth daily before breakfast. ) 135 tablet 1 10/24/2015 at Unknown time  . warfarin (COUMADIN) 2 MG tablet TAKE ONE (1) TABLET EACH DAY (Patient taking differently: TAKE ONE-HALF TABLET BY MOUTH ONCE DAILY IN THE EVENING) 30 tablet 2 10/23/2015 at 1800    Assessment: 82 yo lady to continue coumadin for afib.  Last dose was 2/13.  INR remains elevated.  Goal of Therapy:  INR 2-3 Monitor platelets by  anticoagulation protocol: Yes   Plan:  No coumadin today. Daily PT/INR Monitor for bleeding complications  Pricilla Larsson 10/25/2015,8:59 AM

## 2015-10-25 NOTE — Progress Notes (Signed)
Referral was followed up at/sent to: Ann Held to call back Probation officer with updates. [place] - per [place], at [employer]. [place] - per Sharene Butters, referral to be reviewed in am. [place] - per Vaughan Basta, the calls rolled over to [employer] in [place]. [facility] - accepting referrals. Referral faxed. [place] - left voicemail.    Verlon Setting, [place] Disposition staff 10/25/2015 10:08 PM

## 2015-10-25 NOTE — Progress Notes (Addendum)
Patient's referral including chest xray results were re-faxed to [facility] for review.  Verlon Setting, [place] Disposition staff 10/25/2015 3:58 PM

## 2015-10-25 NOTE — BH Assessment (Addendum)
Tele Assessment Note   Julie Carpenter is an 82 y.o. female  that presents this date with thoughts of self harm and overdosed on Elavil yesterday 10/24/15. Patient has been residing with her daughter Weldon Inches [phone] for the last few weeks due to patient not being able to reside alone. Collateral information gathered from the daughter on admission  from daughter stated patient has been receiving Elavil that had been prescribed by Ronnald Collum NP at [facility] . Daughter was unsure when the medication was prescribed but had D/Ced that medication three weeks ago from the advice of her family practitioner and Hassell Done NP to administer only on a PRN bases due to patient's other health issues. Daughter stated that her mother was "all to pieces" yesterday and asked for one of her "nerve pills." Daughter stated that when she gave her mother one of the pills and took the bottle of medication into another room, mother supposedly went in to that room and took 16 more pills. Daughter stated she returned to find patient lying on her bed unresponsive. Patient was transported to [facility] as poison control center was contacted and monitored. Patient stated during assessment, "I dont want to live like this anymore" and just wanted it "to be over." Daughter reports patient has been decompensating the last few weeks with noticeable memory loss. Patient was not time, place oriented on intake. Patient was open for voluntary admission and admitted to needling help. Patient denies any AVH or prior thoughts of S/I before yesterday. Case was staffed with Markus Jarvis NP who agreed patient meet criteria for inpatient gerontology admission as appropriate bed placement is explored. Disposition was rendered to [employer] RN at [facility] as patient awaits placement.    Diagnosis: Depression   Past Medical History:  Past Medical History  Diagnosis Date  . Hyperlipidemia   . Hypertension   . Diabetes  mellitus without complication ([place])   . Atrial fibrillation [facility])     Past Surgical History  Procedure Laterality Date  . Abdominal hysterectomy    . Cholecystectomy    . Bladder tack    . Esophagogastroduodenoscopy N/A 04/06/2013    Procedure: ESOPHAGOGASTRODUODENOSCOPY (EGD);  Surgeon: Danie Binder, MD;  Location: [facility] END[facility] SU[facility];  Service: Endoscopy;  Laterality: N/A;  8:45  . Givens capsule study  04/06/2013    Procedure: GIVENS CAPSULE STUDY;  Surgeon: Danie Binder, MD;  Location: [facility];  Service: Endoscopy;;  . Colonoscopy N/A 05/03/2013    [id number] MOST LIKELY DUE TO ONE AVM in the ascending colon/Moderate diverticulosis hroughout the entire examined colon/Two POLYPS REMOVED/Small internal hemorrhoids    Family History:  Family History  Problem Relation Age of Onset  . Colon cancer Neg Hx     Social History:  reports that she has never smoked. She does not have any smokeless tobacco history on file. She reports that she does not drink alcohol or use illicit drugs.  Additional Social History:  Alcohol / Drug Use Pain Medications: See MAR Prescriptions: See MAR Over the Counter: See MAR History of alcohol / drug use?: No history of alcohol / drug abuse  CIWA: CIWA-Ar BP: (!) 111/96 mmHg Pulse Rate: 83 COWS:    PATIENT STRENGTHS: (choose at least two) Motivation for treatment/growth Supportive family/friends  Allergies:  Allergies  Allergen Reactions  . Penicillins Anaphylaxis    Has patient had a PCN reaction causing immediate rash, facial/tongue/throat swelling, SOB or lightheadedness with hypotension: Yes Has patient had a  PCN reaction causing severe rash involving mucus membranes or skin necrosis: No Has patient had a PCN reaction that required hospitalization No Has patient had a PCN reaction occurring within the last 10 years: No If all of the above answers are "NO", then may proceed with Cephalosporin use.  Marland Kitchen Benicar [Olmesartan Medoxomil] Swelling     Lips swelling  . Ciprofloxacin Swelling  . Hctz [Hydrochlorothiazide] Swelling    Lips swelling  . Januvia [Sitagliptin Phosphate] Swelling    Lips swelling  . Norvasc [Amlodipine Besylate] Swelling    Lips swelling  . Ramipril Swelling    Lips swelling    Home Medications:  Medications Prior to Admission  Medication Sig Dispense Refill  . amitriptyline (ELAVIL) 50 MG tablet Take 50 mg by mouth at bedtime as needed for sleep.    Marland Kitchen atorvastatin (LIPITOR) 40 MG tablet TAKE ONE (1) TABLET EACH DAY (Patient taking differently: Take 40 mg by mouth at bedtime. ) 90 tablet 1  . calcitRIOL (ROCALTROL) 0.25 MCG capsule Take 0.25 mcg by mouth 3 (three) times a week. Mon.,Wed.,Fri.  5  . carvedilol (COREG) 12.5 MG tablet Take 1 tablet (12.5 mg total) by mouth 2 (two) times daily with a meal. 60 tablet 0  . epoetin alfa (EPOGEN,PROCRIT) 2000 UNIT/ML injection Inject 2,000 Units into the vein every 21 ( twenty-one) days. Patient has procrit administered at [facility] short stay every 3 weeks    . fenofibrate (TRICOR) 145 MG tablet Take 1 tablet (145 mg total) by mouth daily. 90 tablet 1  . furosemide (LASIX) 20 MG tablet Take 1 tablet (20 mg total) by mouth daily. (Patient taking differently: Take 20-40 mg by mouth daily. ) 90 tablet 1  . glimepiride (AMARYL) 2 MG tablet Take 1.5 tablets (3 mg total) by mouth daily before breakfast. (Patient taking differently: Take 2 mg by mouth daily before breakfast. ) 135 tablet 1  . warfarin (COUMADIN) 2 MG tablet TAKE ONE (1) TABLET EACH DAY (Patient taking differently: TAKE ONE-HALF TABLET BY MOUTH ONCE DAILY IN THE EVENING) 30 tablet 2    OB/GYN Status:  No LMP recorded. Patient has had a hysterectomy.  General Assessment Data Location of Assessment: [facility] ED TTS Assessment: In system Is this a Tele or Face-to-Face Assessment?: Tele Assessment Is this an Initial Assessment or a Re-assessment for this encounter?: Initial Assessment Marital status:  Single Maiden name: na Is patient pregnant?: No Pregnancy Status: No Living Arrangements: Children Can pt return to current living arrangement?: No (patient stated they would like assisted living) Admission Status: Voluntary Is patient capable of signing voluntary admission?: Yes Referral Source: Self/Family/Friend Insurance type: Medicare  Medical Screening Exam ([place]) Medical Exam completed: Yes  Crisis Care Plan Living Arrangements: Children Legal Guardian: Other: (None) Name of Psychiatrist: Ronnald Collum NP Name of Therapist: Ronnald Collum NP  Education Status Is patient currently in school?: No Current Grade: na Highest grade of school patient has completed: 54 Name of school: na Contact person: na  Risk to self with the past 6 months Suicidal Ideation: Yes-Currently Present Has patient been a risk to self within the past 6 months prior to admission? : No Suicidal Intent: Yes-Currently Present Has patient had any suicidal intent within the past 6 months prior to admission? : No Is patient at risk for suicide?: Yes Suicidal Plan?: Yes-Currently Present Has patient had any suicidal plan within the past 6 months prior to admission? : No Specify Current Suicidal Plan: Patient overdosed on medication  Access to Means: No (Medications have been removed) What has been your use of drugs/alcohol within the last 12 months?: Denies Previous Attempts/Gestures: No How many times?: 0 Other Self Harm Risks: None Triggers for Past Attempts: Unknown Intentional Self Injurious Behavior: None Family Suicide History: No Recent stressful life event(s): Other (Comment) (Declining health) Persecutory voices/beliefs?: No Depression: Yes Depression Symptoms: Despondent, Fatigue, Loss of interest in usual pleasures Substance abuse history and/or treatment for substance abuse?: No Suicide prevention information given to non-admitted patients: Not applicable  Risk to Others within  the past 6 months Homicidal Ideation: No Does patient have any lifetime risk of violence toward others beyond the six months prior to admission? : No Thoughts of Harm to Others: No Current Homicidal Intent: No Current Homicidal Plan: No Access to Homicidal Means: No Identified Victim: na History of harm to others?: No Assessment of Violence: None Noted Violent Behavior Description: na Does patient have access to weapons?: Yes (Comment) (Pt has a weapon at home but daughter was going to remove) Criminal Charges Pending?: No Does patient have a court date: No Is patient on probation?: No  Psychosis Hallucinations: None noted Delusions: None noted  Mental Status Report Appearance/Hygiene: Unremarkable Eye Contact: Fair Motor Activity: Unremarkable Speech: Soft, Slow Level of Consciousness: Alert Mood: Depressed Affect: Depressed Anxiety Level: Minimal Thought Processes: Tangential Judgement: Unimpaired Orientation: Not oriented Obsessive Compulsive Thoughts/Behaviors: None  Cognitive Functioning Concentration: Decreased Memory: Remote Impaired, Recent Impaired IQ: Average Insight: Fair Impulse Control: Fair Appetite: Fair Weight Loss: 0 Weight Gain: 0 Sleep: No Change Total Hours of Sleep: 8 Vegetative Symptoms: None  ADLScreening [facility] Assessment Services) Patient's cognitive ability adequate to safely complete daily activities?: Yes Patient able to express need for assistance with ADLs?: Yes Independently performs ADLs?: No  Prior Inpatient Therapy Prior Inpatient Therapy: No Prior Therapy Dates: na Prior Therapy Facilty/Provider(s): [place] Reason for Treatment: Depression  Prior Outpatient Therapy Prior Outpatient Therapy: No Prior Therapy Dates: na Prior Therapy Facilty/Provider(s): na Reason for Treatment: na Does patient have an ACCT team?: No Does patient have Intensive In-House Services?  : No Does patient have [facility] services? :  No Does patient have P4CC services?: No  ADL Screening (condition at time of admission) Patient's cognitive ability adequate to safely complete daily activities?: Yes Is the patient deaf or have difficulty hearing?: No Does the patient have difficulty seeing, even when wearing glasses/contacts?: No Does the patient have difficulty concentrating, remembering, or making decisions?: Yes Patient able to express need for assistance with ADLs?: Yes Does the patient have difficulty dressing or bathing?: No Independently performs ADLs?: No Communication: Independent Dressing (OT): Needs assistance Is this a change from baseline?: Pre-admission baseline Grooming: Independent Is this a change from baseline?: Pre-admission baseline Feeding: Independent Bathing: Needs assistance Is this a change from baseline?: Pre-admission baseline Toileting: Independent Is this a change from baseline?: Pre-admission baseline In/Out Bed: Independent Is this a change from baseline?: Pre-admission baseline Walks in Home: Independent Does the patient have difficulty walking or climbing stairs?: Yes Weakness of Legs: None Weakness of Arms/Hands: None  Home Assistive Devices/Equipment Home Assistive Devices/Equipment: None  Therapy Consults (therapy consults require a physician order) PT Evaluation Needed: No OT Evalulation Needed: No SLP Evaluation Needed: No Abuse/Neglect Assessment (Assessment to be complete while patient is alone) Physical Abuse: Denies Verbal Abuse: Denies Sexual Abuse: Denies Exploitation of patient/patient's resources: Denies Self-Neglect: Denies Values / Beliefs Cultural Requests During Hospitalization: None Spiritual Requests During Hospitalization: None Consults Spiritual Care Consult Needed:  No Social Work Consult Needed: No Regulatory affairs officer (For Healthcare) Does patient have an advance directive?: No Would patient like information on creating an advanced directive?: No  - patient declined information Nutrition Screen- [facility] Adult/[facility]/AP Patient's home diet: Regular Has the patient recently lost weight without trying?: No Has the patient been eating poorly because of a decreased appetite?: No Malnutrition Screening Tool Score: 0  Additional Information 1:1 In Past 12 Months?: No CIRT Risk: No Elopement Risk: No Does patient have medical clearance?: Yes     Disposition: Case was staffed with Markus Jarvis NP who agreed patient meet criteria for inpatient gerontology admission as appropriate bed placement is explored. Disposition was rendered to [employer] RN at [facility] as patient awaits placement.    Disposition Initial Assessment Completed for this Encounter: Yes Disposition of Patient: Inpatient treatment program Type of inpatient treatment program: Adult (Gerontology)  Mamie Nick 10/25/2015 12:32 PM

## 2015-10-25 NOTE — Progress Notes (Signed)
PROGRESS NOTE  Julie Carpenter [id number] DOB: 1926/04/19 DOA: 10/24/2015 PCP: Eustaquio Maize, MD  Summary: 26 yof with PMH of CKD, DM, HTN, HLD, afib and lower GI bleed presented with complaints of intentional overdose on Elavil. It is reported that she told her daughter "I dont want to live like this anymore" and proceeded to ingest 16 Elavil 50mg  tablets. In the ED she was noted to be in afib with rate controlled, QTc of 43ms, Potassium 3.2, Creatinine 2.20. Tylenol level and toxic  Screen were both negative. Poison control center recommended 12 hours of monitoring. Hospitalist was asked to admit her for monitoring of TCA overdose and for psychiatric consultation  Assessment/Plan: 1. Suicidal overdose, reported to ingest 16 Elavil 50mg  tablets. No apparent sequela. Telemetry unremarkable. No further evaluation suggested. 2. Atrial fibrillation, rate controlled. Warfarin on hold due to high INR. 3. Hypokalemia 4. Essential HTN, stable. 5. CKD Stage III, Creatinine appears to be at baseline.    Medically stable. No apparent sequela from overdose. Discussed with poison control, no further recommendations. Landscape architect.  Psychiatry has recommended inpatient placement.   Code Status: DNR DVT prophylaxis: Warfarin Family Communication: Daughters [name] and Ivin Booty are present at bedside Disposition Plan: Pending psychiatric recommendation  Murray Hodgkins, MD  [facility]  Pager [phone] If 7PM-7AM, please contact night-coverage at [email or web address], password [facility] 10/25/2015, 10:50 AM    Consultants:  None  Procedures:  None   Antibiotics:  None  HPI/Subjective: Patient reports that she feels OK, Denies any pain, nause , vomiting. She states that her breathing is okay. Patient denies trying to kill herself, but admits to depression.   Per daughter, she lives by herself. Per daughter, patient then went home with daughter, and then later at night was found by  neighbors while walking down the street. She then went home with other daughter and patient began to continually state that she could not live by herself. Appeared fine later that night. The next morning, she began to act abnormally again, claiming she needed a nerve pill, the afternoon of 2/14. Daughter retrieved the pills from the mother's house and thought that she had hid them. Patient found the pills. She told her daughter, [name], that she had taken a lot of pills and wanted to commit suicide. Her reasoning for wanting to commit suicide was that she "did not want to live like this".  According to daughters, patient has been anxious and depressed, although this is her first suicide attempt. She has not been seeing a therapist. Mental condition began deteriorating around Christmas time, when she began experiencing multiple medical problems.  Objective: Filed Vitals:   10/24/15 1830 10/24/15 2000 10/24/15 2040 10/25/15 0310  BP: 105/83 137/85  111/96  Pulse: 87 77  83  Temp:  97.7 F (36.5 C)  97.4 F (36.3 C)  TempSrc:  Oral  Oral  Resp: 19 18  19   Height:  5\' 4"  (1.626 m)    Weight:    72.122 kg (159 lb)  SpO2: 100% 100% 99% 98%    Intake/Output Summary (Last 24 hours) at 10/25/15 1050 Last data filed at 10/25/15 0511  Gross per 24 hour  Intake   1090 ml  Output      0 ml  Net   1090 ml     Filed Weights   10/25/15 0310  Weight: 72.122 kg (159 lb)    Exam:  Afebrile, VSS, not hypoxic  General:  Appears calm and comfortable Cardiovascular: RRR, no  m/r/g. 1-2+ BLE edema. Telemetry: Atrial fibrillation Respiratory: CTA bilaterally, no w/r/r. Normal respiratory effort. Abdomen: soft, ntnd Psychiatric: grossly normal mood and affect, speech fluent and appropriate, oriented to name and place. Confused on month and year and reason for hospital visit.  Neurologic: grossly non-focal.  New data reviewed:  Potassium 3.2  Creatinine 2.20 BUN 31  Hgb 9.1  INR  3.79  Pertinent data since admission:  Salicylate level negative  Acetaminophen level negative  Pending data:  None  Scheduled Meds: . atorvastatin  40 mg Oral QHS  . carvedilol  12.5 mg Oral BID WC  . fenofibrate  160 mg Oral Daily  . insulin aspart  0-9 Units Subcutaneous 6 times per day  . sodium chloride flush  3 mL Intravenous Q12H  . [START ON 10/26/2015] Warfarin - Pharmacist Dosing Inpatient   Does not apply Q24H   Continuous Infusions: . dextrose 5 % and 0.9% NaCl 50 mL/hr at 10/24/15 1947    Principal Problem:   Suicidal overdose ([place]) Active Problems:   HTN (hypertension)   Diabetes ([place])   Atrial fibrillation ([place])   Pulmonary embolism ([place])   Depression   Time spent 25 minutes   By signing my name below, I, Delene Ruffini, attest that this documentation has been prepared under the direction and in the presence of Victoriya Pol P. Sarajane Jews, MD. Electronically Signed: Delene Ruffini, Scribe. 10/25/2015  11:22 am  I personally performed the services described in this documentation. All medical record entries made by the scribe were at my direction. I have reviewed the chart and agree that the record reflects my personal performance and is accurate and complete. Murray Hodgkins, MD

## 2015-10-25 NOTE — Care Management Obs Status (Signed)
[place] NOTIFICATION   Patient Details  Name: Julie Carpenter MRN: [id number] Date of Birth: 11-10-1925   Medicare Observation Status Notification Given:  Yes    Sherald Barge, RN 10/25/2015, 11:54 AM

## 2015-10-26 DIAGNOSIS — T50902A Poisoning by unspecified drugs, medicaments and biological substances, intentional self-harm, initial encounter: Secondary | ICD-10-CM | POA: Diagnosis not present

## 2015-10-26 DIAGNOSIS — N184 Chronic kidney disease, stage 4 (severe): Secondary | ICD-10-CM

## 2015-10-26 DIAGNOSIS — F0391 Unspecified dementia with behavioral disturbance: Secondary | ICD-10-CM | POA: Diagnosis present

## 2015-10-26 DIAGNOSIS — F03918 Unspecified dementia, unspecified severity, with other behavioral disturbance: Secondary | ICD-10-CM | POA: Diagnosis present

## 2015-10-26 DIAGNOSIS — F332 Major depressive disorder, recurrent severe without psychotic features: Secondary | ICD-10-CM | POA: Diagnosis present

## 2015-10-26 LAB — COMPREHENSIVE METABOLIC PANEL
ALT: 41 U/L (ref 14–54)
ANION GAP: 6 (ref 5–15)
AST: 82 U/L — ABNORMAL HIGH (ref 15–41)
Albumin: 2.8 g/dL — ABNORMAL LOW (ref 3.5–5.0)
Alkaline Phosphatase: 46 U/L (ref 38–126)
BUN: 35 mg/dL — ABNORMAL HIGH (ref 6–20)
CHLORIDE: 110 mmol/L (ref 101–111)
CO2: 26 mmol/L (ref 22–32)
Calcium: 8.9 mg/dL (ref 8.9–10.3)
Creatinine, Ser: 2.14 mg/dL — ABNORMAL HIGH (ref 0.44–1.00)
GFR calc non Af Amer: 20 mL/min — ABNORMAL LOW (ref >60)
GFR, EST AFRICAN AMERICAN: 23 mL/min — AB (ref >60)
Glucose, Bld: 60 mg/dL — ABNORMAL LOW (ref 65–99)
Potassium: 4.2 mmol/L (ref 3.5–5.1)
SODIUM: 142 mmol/L (ref 135–145)
Total Bilirubin: 0.7 mg/dL (ref 0.3–1.2)
Total Protein: 5.7 g/dL — ABNORMAL LOW (ref 6.5–8.1)

## 2015-10-26 LAB — GLUCOSE, CAPILLARY
GLUCOSE-CAPILLARY: 102 mg/dL — AB (ref 65–99)
GLUCOSE-CAPILLARY: 39 mg/dL — AB (ref 65–99)
GLUCOSE-CAPILLARY: 46 mg/dL — AB (ref 65–99)
GLUCOSE-CAPILLARY: 49 mg/dL — AB (ref 65–99)
GLUCOSE-CAPILLARY: 150 mg/dL — AB (ref 65–99)
GLUCOSE-CAPILLARY: 73 mg/dL (ref 65–99)
Glucose-Capillary: 55 mg/dL — ABNORMAL LOW (ref 65–99)
Glucose-Capillary: 70 mg/dL (ref 65–99)
Glucose-Capillary: 86 mg/dL (ref 65–99)

## 2015-10-26 LAB — PROTIME-INR
INR: 3.34 — AB (ref 0.00–1.49)
Prothrombin Time: 33.1 seconds — ABNORMAL HIGH (ref 11.6–15.2)

## 2015-10-26 MED ORDER — DEXTROSE 50 % IV SOLN
INTRAVENOUS | Status: AC
  Filled 2015-10-26: qty 50

## 2015-10-26 MED ORDER — FUROSEMIDE 20 MG PO TABS
20.0000 mg | ORAL_TABLET | Freq: Every day | ORAL | Status: DC
  Administered 2015-10-26 – 2015-10-28 (×3): 20 mg via ORAL
  Filled 2015-10-26 (×3): qty 1

## 2015-10-26 MED ORDER — WARFARIN SODIUM 2 MG PO TABS
ORAL_TABLET | ORAL | Status: DC
Start: 2015-10-26 — End: 2016-01-24

## 2015-10-26 MED ORDER — DEXTROSE 50 % IV SOLN
25.0000 mL | Freq: Once | INTRAVENOUS | Status: AC
  Administered 2015-10-26: 25 mL via INTRAVENOUS

## 2015-10-26 NOTE — Progress Notes (Addendum)
FNP Guadelupe Sabin recommened psychiatric inpatient treatment.  Referrals to psych IP treatment were followed up at: [facility] - left voicemail inquiring of bed status. [place] [facility] per [place], referral received and will be reviewed, MD will review local referrals first. [facility] - no d/c in the next 2 days.Per Ria Comment: "Honestly, I would wait a day or two and then check in again." [facility] - 2 geriatric beds, referral has been faxed. [facility] and [facility] - accepting referrals for the waitlist.   Declined at: [facility] - due medically unstable [facility] - due to being Coumadin, per [place]  At capacity: [facility] - per [facility] - per [facility] Abrahams - per [facility] - per [place], child bed only  CSW will continue to seek placement.  Verlon Setting, [place] Disposition staff 10/26/2015 5:07 PM

## 2015-10-26 NOTE — Consult Note (Signed)
Telepsych Consultation   Reason for Consult:  Suicide attempt overdose Referring Physician:  [facility] attending Patient Identification: Julie Carpenter MRN:  [id number] Principal Diagnosis: MDD (major depressive disorder), recurrent severe, without psychosis ([place]) with suicide attempt overdose Diagnosis:   Patient Active Problem List   Diagnosis Date Noted  . Suicidal overdose ([place]) [T50.902A] 10/24/2015  . Depression [F32.9] 10/24/2015  . Other acute pulmonary embolism without acute cor pulmonale ([place]) [I26.99] 08/31/2015  . Long term (current) use of anticoagulants [Z79.01] [Z79.01] 08/31/2015  . Acute renal failure superimposed on stage 4 chronic kidney disease ([place]) [N17.9, N18.4] 08/28/2015  . Pulmonary embolism ([place]) [I26.99] 08/23/2015  . Shortness of breath [R06.02] 08/22/2015  . AKI (acute kidney injury) ([place]) [N17.9]   . Dyspnea [R06.00] 08/17/2015  . DOE (dyspnea on exertion) [R06.09] 08/17/2015  . GERD (gastroesophageal reflux disease) [K21.9] 02/23/2014  . Peripheral edema [R60.9] 02/23/2014  . GI bleed [K92.2] 04/05/2013  . Atrial fibrillation ([place]) [I48.91] 02/24/2013  . HTN (hypertension) [I10] 12/26/2010  . Chronic kidney disease (CKD) stage G3b/A2, moderately decreased glomerular filtration rate (GFR) between 30-44 mL/min/1.73 square meter and albuminuria creatinine ratio between 30-299 mg/g [N18.3] 12/26/2010  . Hyperlipemia [E78.5] 12/26/2010  . Peripheral neuropathy ([place]) [G62.9] 12/26/2010  . Anemia [D64.9] 12/26/2010  . Diabetes ([place]) [E11.9] 12/26/2010  . Vertebral fracture [IMO0002] 12/26/2010  . Venous insufficiency [I87.2] 12/26/2010  . Osteoporosis [M81.0] 12/26/2010  . Thyromegaly [E04.9] 12/26/2010    Total Time spent with patient: 30 minutes  Subjective:   Julie Carpenter is a 82 y.o. female patient admitted with reports of suicide attempt with overdose of Elavil on 10/24/15. Pt is alert, oriented to self and situation, yet intermittently to time and  place due to moderate dementia. Pt denies homicidal ideation and psychosis and does not appear to be responding to internal stimuli. Minimizing suicidal thoughts although she dose affirm suicidal intent when she overdosed.   Collateral from both daughters is that pt has been severely depressed for a year, worsening significantly over the past 3 months. The pt lives with one of her daughters who helps her with her medication. Both daughters (on speakerphone) voiced their concerns that they cannot keep her safe nor do they have time to watch her and are working on ALF paperwork now. See plan below.   HPI:  I have reviewed and concur with the following HPI elements from [facility] TTS assessment, modified as follows.  Julie Carpenter is an 83 y.o. female that presents this date with thoughts of self harm and overdosed on Elavil yesterday 10/24/15. Patient has been residing with her daughter Julie Carpenter [phone] for the last few weeks due to patient not being able to reside alone. Collateral information gathered from the daughter on admission from daughter stated patient has been receiving Elavil that had been prescribed by Ronnald Collum NP at [facility] . Daughter was unsure when the medication was prescribed but had D/Ced that medication three weeks ago from the advice of her family practitioner and Hassell Done NP to administer only on a PRN bases due to patient's other health issues. Daughter stated that her mother was "all to pieces" yesterday and asked for one of her "nerve pills." Daughter stated that when she gave her mother one of the pills and took the bottle of medication into another room, mother supposedly went in to that room and took 16 more pills. Daughter stated she returned to find patient lying on her bed unresponsive. Patient was transported to  [facility] as poison control center was contacted and monitored. Patient stated during assessment, "I dont want to live like this  anymore" and just wanted it "to be over." Daughter reports patient has been decompensating the last few weeks with noticeable memory loss. Patient was not time, place oriented on intake. Patient was open for voluntary admission and admitted to needling help. Patient denies any AVH or prior thoughts of S/I before yesterday. Case was staffed with Markus Jarvis NP who agreed patient meet criteria for inpatient gerontology admission as appropriate bed placement is explored. Disposition was rendered to [employer] RN at [facility] as patient awaits placement.   Past Psychiatric History: MDD  Risk to Self: Suicidal Ideation: Yes-Currently Present Suicidal Intent: Yes-Currently Present Is patient at risk for suicide?: Yes Suicidal Plan?: Yes-Currently Present Specify Current Suicidal Plan: Patient overdosed on medication Access to Means: No (Medications have been removed) What has been your use of drugs/alcohol within the last 12 months?: Denies How many times?: 0 Other Self Harm Risks: None Triggers for Past Attempts: Unknown Intentional Self Injurious Behavior: None Risk to Others: Homicidal Ideation: No Thoughts of Harm to Others: No Current Homicidal Intent: No Current Homicidal Plan: No Access to Homicidal Means: No Identified Victim: na History of harm to others?: No Assessment of Violence: None Noted Violent Behavior Description: na Does patient have access to weapons?: Yes (Comment) (Pt has a weapon at home but daughter was going to remove) Criminal Charges Pending?: No Does patient have a court date: No Prior Inpatient Therapy: Prior Inpatient Therapy: No Prior Therapy Dates: na Prior Therapy Facilty/Provider(s): [place] Reason for Treatment: Depression Prior Outpatient Therapy: Prior Outpatient Therapy: No Prior Therapy Dates: na Prior Therapy Facilty/Provider(s): na Reason for Treatment: na Does patient have an ACCT team?: No Does patient have Intensive In-House Services?   : No Does patient have [facility] services? : No Does patient have P4CC services?: No  Past Medical History:  Past Medical History  Diagnosis Date  . Hyperlipidemia   . Hypertension   . Diabetes mellitus without complication ([place])   . Atrial fibrillation [facility])     Past Surgical History  Procedure Laterality Date  . Abdominal hysterectomy    . Cholecystectomy    . Bladder tack    . Esophagogastroduodenoscopy N/A 04/06/2013    Procedure: ESOPHAGOGASTRODUODENOSCOPY (EGD);  Surgeon: Danie Binder, MD;  Location: [facility] ENDO SUITE;  Service: Endoscopy;  Laterality: N/A;  8:45  . Givens capsule study  04/06/2013    Procedure: GIVENS CAPSULE STUDY;  Surgeon: Danie Binder, MD;  Location: [facility] ENDO SUITE;  Service: Endoscopy;;  . Colonoscopy N/A 05/03/2013    AES:LPNPYY MOST LIKELY DUE TO ONE AVM in the ascending colon/Moderate diverticulosis hroughout the entire examined colon/Two POLYPS REMOVED/Small internal hemorrhoids   Family History:  Family History  Problem Relation Age of Onset  . Colon cancer Neg Hx    Family Psychiatric  History: unknown Social History:  History  Alcohol Use No     History  Drug Use No    Social History   Social History  . Marital Status: Widowed    Spouse Name: N/A  . Number of Children: N/A  . Years of Education: N/A   Social History Main Topics  . Smoking status: Never Smoker   . Smokeless tobacco: None  . Alcohol Use: No  . Drug Use: No  . Sexual Activity: Not Asked   Other Topics Concern  . None   Social History Narrative  Additional Social History:    Allergies:   Allergies  Allergen Reactions  . Penicillins Anaphylaxis    Has patient had a PCN reaction causing immediate rash, facial/tongue/throat swelling, SOB or lightheadedness with hypotension: Yes Has patient had a PCN reaction causing severe rash involving mucus membranes or skin necrosis: No Has patient had a PCN reaction that required hospitalization No Has patient had a PCN  reaction occurring within the last 10 years: No If all of the above answers are "NO", then may proceed with Cephalosporin use.  Marland Kitchen Benicar [Olmesartan Medoxomil] Swelling    Lips swelling  . Ciprofloxacin Swelling  . Hctz [Hydrochlorothiazide] Swelling    Lips swelling  . Januvia [Sitagliptin Phosphate] Swelling    Lips swelling  . Norvasc [Amlodipine Besylate] Swelling    Lips swelling  . Ramipril Swelling    Lips swelling    Labs:  Results for orders placed or performed during the hospital encounter of 10/24/15 (from the past 48 hour(s))  Comprehensive metabolic panel     Status: Abnormal   Collection Time: 10/24/15  2:13 PM  Result Value Ref Range   Sodium 139 135 - 145 mmol/L   Potassium 3.2 (L) 3.5 - 5.1 mmol/L   Chloride 105 101 - 111 mmol/L   CO2 26 22 - 32 mmol/L   Glucose, Bld 193 (H) 65 - 99 mg/dL   BUN 31 (H) 6 - 20 mg/dL   Creatinine, Ser 2.20 (H) 0.44 - 1.00 mg/dL   Calcium 8.9 8.9 - 10.3 mg/dL   Total Protein 6.3 (L) 6.5 - 8.1 g/dL   Albumin 3.1 (L) 3.5 - 5.0 g/dL   AST 107 (H) 15 - 41 U/L   ALT 46 14 - 54 U/L   Alkaline Phosphatase 46 38 - 126 U/L   Total Bilirubin 0.9 0.3 - 1.2 mg/dL   GFR calc non Af Amer 19 (L) >60 mL/min   GFR calc Af Amer 22 (L) >60 mL/min    Comment: (NOTE) The eGFR has been calculated using the CKD EPI equation. This calculation has not been validated in all clinical situations. eGFR's persistently <60 mL/min signify possible Chronic Kidney Disease.    Anion gap 8 5 - 15  CBC with Differential     Status: Abnormal   Collection Time: 10/24/15  2:13 PM  Result Value Ref Range   WBC 4.4 4.0 - 10.5 K/uL   RBC 3.46 (L) 3.87 - 5.11 MIL/uL   Hemoglobin 9.1 (L) 12.0 - 15.0 g/dL   HCT 28.2 (L) 36.0 - 46.0 %   MCV 81.5 78.0 - 100.0 fL   MCH 26.3 26.0 - 34.0 pg   MCHC 32.3 30.0 - 36.0 g/dL   RDW 18.4 (H) 11.5 - 15.5 %   Platelets 312 150 - 400 K/uL   Neutrophils Relative % 66 %   Neutro Abs 2.9 1.7 - 7.7 K/uL   Lymphocytes Relative  22 %   Lymphs Abs 1.0 0.7 - 4.0 K/uL   Monocytes Relative 9 %   Monocytes Absolute 0.4 0.1 - 1.0 K/uL   Eosinophils Relative 2 %   Eosinophils Absolute 0.1 0.0 - 0.7 K/uL   Basophils Relative 1 %   Basophils Absolute 0.1 0.0 - 0.1 K/uL  Protime-INR     Status: Abnormal   Collection Time: 10/24/15  2:13 PM  Result Value Ref Range   Prothrombin Time 33.2 (H) 11.6 - 15.2 seconds   INR 3.35 (H) 5.27 - 7.82  Salicylate level  Status: None   Collection Time: 10/24/15  2:13 PM  Result Value Ref Range   Salicylate Lvl <0.0 2.8 - 30.0 mg/dL  Acetaminophen level     Status: Abnormal   Collection Time: 10/24/15  2:13 PM  Result Value Ref Range   Acetaminophen (Tylenol), Serum <10 (L) 10 - 30 ug/mL    Comment:        THERAPEUTIC CONCENTRATIONS VARY SIGNIFICANTLY. A RANGE OF 10-30 ug/mL MAY BE AN EFFECTIVE CONCENTRATION FOR MANY PATIENTS. HOWEVER, SOME ARE BEST TREATED AT CONCENTRATIONS OUTSIDE THIS RANGE. ACETAMINOPHEN CONCENTRATIONS >150 ug/mL AT 4 HOURS AFTER INGESTION AND >50 ug/mL AT 12 HOURS AFTER INGESTION ARE OFTEN ASSOCIATED WITH TOXIC REACTIONS.   TSH     Status: None   Collection Time: 10/24/15  2:13 PM  Result Value Ref Range   TSH 0.415 0.350 - 4.500 uIU/mL  Urinalysis, Routine w reflex microscopic     Status: Abnormal   Collection Time: 10/24/15  4:40 PM  Result Value Ref Range   Color, Urine YELLOW YELLOW   APPearance CLEAR CLEAR   Specific Gravity, Urine 1.010 1.005 - 1.030   pH 6.0 5.0 - 8.0   Glucose, UA NEGATIVE NEGATIVE mg/dL   Hgb urine dipstick SMALL (A) NEGATIVE   Bilirubin Urine NEGATIVE NEGATIVE   Ketones, ur NEGATIVE NEGATIVE mg/dL   Protein, ur NEGATIVE NEGATIVE mg/dL   Nitrite NEGATIVE NEGATIVE   Leukocytes, UA NEGATIVE NEGATIVE  Urine rapid drug screen (hosp performed)     Status: None   Collection Time: 10/24/15  4:40 PM  Result Value Ref Range   Opiates NONE DETECTED NONE DETECTED   Cocaine NONE DETECTED NONE DETECTED   Benzodiazepines  NONE DETECTED NONE DETECTED   Amphetamines NONE DETECTED NONE DETECTED   Tetrahydrocannabinol NONE DETECTED NONE DETECTED   Barbiturates NONE DETECTED NONE DETECTED    Comment:        DRUG SCREEN FOR MEDICAL PURPOSES ONLY.  IF CONFIRMATION IS NEEDED FOR ANY PURPOSE, NOTIFY LAB WITHIN 5 DAYS.        LOWEST DETECTABLE LIMITS FOR URINE DRUG SCREEN Drug Class       Cutoff (ng/mL) Amphetamine      1000 Barbiturate      200 Benzodiazepine   174 Tricyclics       944 Opiates          300 Cocaine          300 THC              50   Urine microscopic-add on     Status: Abnormal   Collection Time: 10/24/15  4:40 PM  Result Value Ref Range   Squamous Epithelial / LPF 0-5 (A) NONE SEEN   WBC, UA 0-5 0 - 5 WBC/hpf   RBC / HPF 0-5 0 - 5 RBC/hpf   Bacteria, UA MANY (A) NONE SEEN  Glucose, capillary     Status: None   Collection Time: 10/24/15  8:37 PM  Result Value Ref Range   Glucose-Capillary 99 65 - 99 mg/dL  Glucose, capillary     Status: Abnormal   Collection Time: 10/24/15 11:45 PM  Result Value Ref Range   Glucose-Capillary 111 (H) 65 - 99 mg/dL  Glucose, capillary     Status: Abnormal   Collection Time: 10/25/15  5:07 AM  Result Value Ref Range   Glucose-Capillary 109 (H) 65 - 99 mg/dL  Glucose, capillary     Status: Abnormal   Collection Time: 10/25/15  7:17  AM  Result Value Ref Range   Glucose-Capillary 124 (H) 65 - 99 mg/dL   Comment 1 Notify RN   Protime-INR     Status: Abnormal   Collection Time: 10/25/15  7:26 AM  Result Value Ref Range   Prothrombin Time 36.5 (H) 11.6 - 15.2 seconds   INR 3.79 (H) 0.00 - 1.49  Glucose, capillary     Status: Abnormal   Collection Time: 10/25/15 11:23 AM  Result Value Ref Range   Glucose-Capillary 151 (H) 65 - 99 mg/dL   Comment 1 Notify RN   Glucose, capillary     Status: Abnormal   Collection Time: 10/25/15  4:32 PM  Result Value Ref Range   Glucose-Capillary 126 (H) 65 - 99 mg/dL   Comment 1 Notify RN   Glucose, capillary      Status: Abnormal   Collection Time: 10/25/15  9:23 PM  Result Value Ref Range   Glucose-Capillary 167 (H) 65 - 99 mg/dL  Protime-INR     Status: Abnormal   Collection Time: 10/26/15  5:20 AM  Result Value Ref Range   Prothrombin Time 33.1 (H) 11.6 - 15.2 seconds   INR 3.34 (H) 0.00 - 1.49  Comprehensive metabolic panel     Status: Abnormal   Collection Time: 10/26/15  5:20 AM  Result Value Ref Range   Sodium 142 135 - 145 mmol/L   Potassium 4.2 3.5 - 5.1 mmol/L    Comment: DELTA CHECK NOTED   Chloride 110 101 - 111 mmol/L   CO2 26 22 - 32 mmol/L   Glucose, Bld 60 (L) 65 - 99 mg/dL   BUN 35 (H) 6 - 20 mg/dL   Creatinine, Ser 2.14 (H) 0.44 - 1.00 mg/dL   Calcium 8.9 8.9 - 10.3 mg/dL   Total Protein 5.7 (L) 6.5 - 8.1 g/dL   Albumin 2.8 (L) 3.5 - 5.0 g/dL   AST 82 (H) 15 - 41 U/L   ALT 41 14 - 54 U/L   Alkaline Phosphatase 46 38 - 126 U/L   Total Bilirubin 0.7 0.3 - 1.2 mg/dL   GFR calc non Af Amer 20 (L) >60 mL/min   GFR calc Af Amer 23 (L) >60 mL/min    Comment: (NOTE) The eGFR has been calculated using the CKD EPI equation. This calculation has not been validated in all clinical situations. eGFR's persistently <60 mL/min signify possible Chronic Kidney Disease.    Anion gap 6 5 - 15  Glucose, capillary     Status: Abnormal   Collection Time: 10/26/15  7:22 AM  Result Value Ref Range   Glucose-Capillary 55 (L) 65 - 99 mg/dL   Comment 1 Notify RN   Glucose, capillary     Status: None   Collection Time: 10/26/15  7:41 AM  Result Value Ref Range   Glucose-Capillary 70 65 - 99 mg/dL   Comment 1 Notify RN   Glucose, capillary     Status: Abnormal   Collection Time: 10/26/15  9:30 AM  Result Value Ref Range   Glucose-Capillary 102 (H) 65 - 99 mg/dL   Comment 1 Notify RN     Current Facility-Administered Medications  Medication Dose Route Frequency Provider Last Rate Last Dose  . atorvastatin (LIPITOR) tablet 40 mg  40 mg Oral QHS Orvan Falconer, MD   40 mg at 10/25/15 2004   . carvedilol (COREG) tablet 12.5 mg  12.5 mg Oral BID WC Orvan Falconer, MD   12.5 mg at 10/26/15 6283  .  fenofibrate tablet 160 mg  160 mg Oral Daily Orvan Falconer, MD   160 mg at 10/26/15 4825  . insulin aspart (novoLOG) injection 0-9 Units  0-9 Units Subcutaneous TID WC Samuella Cota, MD   1 Units at 10/25/15 1714  . sodium chloride flush (NS) 0.9 % injection 3 mL  3 mL Intravenous Q12H Orvan Falconer, MD   3 mL at 10/26/15 1000  . Warfarin - Pharmacist Dosing Inpatient   Does not apply Q24H Samuella Cota, MD        Musculoskeletal: UTO, camera  Psychiatric Specialty Exam: Review of Systems  Psychiatric/Behavioral: Positive for depression and suicidal ideas (with attempted overdose). Negative for hallucinations and substance abuse. The patient is nervous/anxious. The patient does not have insomnia.   All other systems reviewed and are negative.   Blood pressure 110/70, pulse 92, temperature 98.7 F (37.1 C), temperature source Oral, resp. rate 20, height _0  (1.626 m), weight 73.9 kg (162 lb 14.7 oz), SpO2 97 %.Body mass index is 27.95 kg/(m^2).  General Appearance: Casual and Fairly Groomed  Engineer, water::  Good  Speech:  Clear and Coherent and Normal Rate  Volume:  Normal  Mood:  Depressed  Affect:  Appropriate, Congruent and Depressed  Thought Process:  Coherent and Goal Directed  Orientation:  Full (Time, Place, and Person)  Thought Content:  symptoms, worries, concerns, talking about being lonely and isolated at home with no friends to talk to or activities to do.   Suicidal Thoughts:  Yes.  with intent/plan  Homicidal Thoughts:  No  Memory:  Immediate;   Fair Recent;   Fair Remote;   Fair  Judgement:  Fair  Insight:  Fair  Psychomotor Activity:  Normal  Concentration:  Fair  Recall:  [employer] [place]  Language: Fair  Akathisia:  No  Handed:    AIMS (if indicated):     Assets:  Communication Skills Desire for Improvement Resilience Social Support  ADL's:   Intact  Cognition: Impaired,  Moderate Dementia (moderate)  Sleep:      Treatment Plan Summary: Daily contact with patient to assess and evaluate symptoms and progress in treatment and Medication management   MDD (major depressive disorder), recurrent severe, without psychosis ([place]), waxing and waning (just overdosed last night)  Disposition:  -Seek Geriatric Psychiatry inpatient -Family to consult with SW regarding ALF placement upon discharge from inpatient psychiatry (filling out papers today)  Benjamine Mola, FNP 10/26/2015 10:41 AM

## 2015-10-26 NOTE — Progress Notes (Signed)
[place] for coumadin Indication: atrial fibrillation  Allergies  Allergen Reactions  . Penicillins Anaphylaxis    Has patient had a PCN reaction causing immediate rash, facial/tongue/throat swelling, SOB or lightheadedness with hypotension: Yes Has patient had a PCN reaction causing severe rash involving mucus membranes or skin necrosis: No Has patient had a PCN reaction that required hospitalization No Has patient had a PCN reaction occurring within the last 10 years: No If all of the above answers are "NO", then may proceed with Cephalosporin use.  Marland Kitchen Benicar [Olmesartan Medoxomil] Swelling    Lips swelling  . Ciprofloxacin Swelling  . Hctz [Hydrochlorothiazide] Swelling    Lips swelling  . Januvia [Sitagliptin Phosphate] Swelling    Lips swelling  . Norvasc [Amlodipine Besylate] Swelling    Lips swelling  . Ramipril Swelling    Lips swelling    Patient Measurements: Height: 5\' 4"  (162.6 cm) Weight: 162 lb 14.7 oz (73.9 kg) IBW/kg (Calculated) : 54.7  Vital Signs: Temp: 98.7 F (37.1 C) (02/16 0612) Temp Source: Oral (02/16 0612) BP: 110/70 mmHg (02/16 0612) Pulse Rate: 92 (02/16 0612)  Labs:  Recent Labs  10/24/15 1413 10/25/15 0726 10/26/15 0520  HGB 9.1*  --   --   HCT 28.2*  --   --   PLT 312  --   --   LABPROT 33.2* 36.5* 33.1*  INR 3.35* 3.79* 3.34*  CREATININE 2.20*  --  2.14*    Estimated Creatinine Clearance: 18.2 mL/min (by C-G formula based on Cr of 2.14).   Medical History: Past Medical History  Diagnosis Date  . Hyperlipidemia   . Hypertension   . Diabetes mellitus without complication ([place])   . Atrial fibrillation ([facility])     Medications:  Prescriptions prior to admission  Medication Sig Dispense Refill Last Dose  . amitriptyline (ELAVIL) 50 MG tablet Take 50 mg by mouth at bedtime as needed for sleep.   10/24/2015 at Unknown time  . atorvastatin (LIPITOR) 40 MG tablet TAKE ONE (1) TABLET EACH DAY  (Patient taking differently: Take 40 mg by mouth at bedtime. ) 90 tablet 1 10/23/2015 at Unknown time  . calcitRIOL (ROCALTROL) 0.25 MCG capsule Take 0.25 mcg by mouth 3 (three) times a week. Mon.,Wed.,Fri.  5 10/23/2015 at Unknown time  . carvedilol (COREG) 12.5 MG tablet Take 1 tablet (12.5 mg total) by mouth 2 (two) times daily with a meal. 60 tablet 0 10/24/2015 at 930A  . epoetin alfa (EPOGEN,PROCRIT) 2000 UNIT/ML injection Inject 2,000 Units into the vein every 21 ( twenty-one) days. Patient has procrit administered at [facility] short stay every 3 weeks   10/18/2015 at Unknown time  . fenofibrate (TRICOR) 145 MG tablet Take 1 tablet (145 mg total) by mouth daily. 90 tablet 1 10/23/2015 at Unknown time  . furosemide (LASIX) 20 MG tablet Take 1 tablet (20 mg total) by mouth daily. (Patient taking differently: Take 20-40 mg by mouth daily. ) 90 tablet 1 10/24/2015 at Unknown time  . glimepiride (AMARYL) 2 MG tablet Take 1.5 tablets (3 mg total) by mouth daily before breakfast. (Patient taking differently: Take 2 mg by mouth daily before breakfast. ) 135 tablet 1 10/24/2015 at Unknown time  . warfarin (COUMADIN) 2 MG tablet TAKE ONE (1) TABLET EACH DAY (Patient taking differently: TAKE ONE-HALF TABLET BY MOUTH ONCE DAILY IN THE EVENING) 30 tablet 2 10/23/2015 at 1800    Assessment: 82 yo lady to continue coumadin for afib.  Last dose  was 2/13.  INR remains elevated.  Goal of Therapy:  INR 2-3 Monitor platelets by anticoagulation protocol: Yes   Plan:  No coumadin today. Daily PT/INR Monitor for bleeding complications  Excell Seltzer Poteet 10/26/2015,9:06 AM

## 2015-10-26 NOTE — Progress Notes (Addendum)
PROGRESS NOTE  Julie Carpenter [id number] DOB: [birth year] DOA: 10/24/2015 PCP: Eustaquio Maize, MD  Summary: 32 yof with PMH of CKD, DM, HTN, HLD, afib and lower GI bleed presented with complaints of intentional overdose on Elavil. It is reported that she told her daughter "I dont want to live like this anymore" and proceeded to ingest 16 Elavil 50mg  tablets. In the ED she was noted to be in afib with rate controlled, QTc of 42ms, Potassium 3.2, Creatinine 2.20. Tylenol level and toxic  Screen were both negative. Poison control center recommended 12 hours of monitoring. Hospitalist was asked to admit her for monitoring of TCA overdose and for psychiatric consultation  Assessment/Plan: 1. Suicidal overdose, intentional ingestion of 16 Elavil 50mg  tablets. No apparent sequela. Case closed by poison control. No further evaluation suggested. 2. Atrial fibrillation, rate controlled. Warfarin on hold due to high INR. 3. Hypokalemia, resolved. 4. Minimal elevation of AST, resolved. clniically insignificant. 5. Essential HTN, remains stable. 6. DM type 2. One episode of hypoglycemia this AM. 7. CKD stage IV, creatinine at baseline.    Remains medically stable. No apparent sequela from overdose. Discussed with poison control, no further recommendations. Landscape architect.  Psychiatry has recommended inpatient placement. Awaiting reassessment and further recommendations.   Code Status: DNR DVT prophylaxis: Warfarin Family Communication: Daughters [name] and Ivin Booty are present at bedside Disposition Plan: Pending psychiatric recommendation  Murray Hodgkins, MD  [facility]  Pager [phone] If 7PM-7AM, please contact night-coverage at [email or web address], password [facility] 10/26/2015, 7:30 AM    Consultants:  None  Procedures:  None   Antibiotics:  None  HPI/Subjective: Feels well. Denies any nausea, vomiting, or pain. Has been eating well. Still feels depressed but denies any  intent to harm herself. Denies suicidal ideation.   Objective: Filed Vitals:   10/25/15 0310 10/25/15 1446 10/25/15 2103 10/26/15 0612  BP: 111/96 112/67 117/63 110/70  Pulse: 83 83 86 92  Temp: 97.4 F (36.3 C) 98.1 F (36.7 C) 98.1 F (36.7 C) 98.7 F (37.1 C)  TempSrc: Oral Oral Oral Oral  Resp: 19 20 20 20   Height:      Weight: 72.122 kg (159 lb)  73.9 kg (162 lb 14.7 oz)   SpO2: 98% 100% 99% 97%    Intake/Output Summary (Last 24 hours) at 10/26/15 0730 Last data filed at 10/25/15 2143  Gross per 24 hour  Intake   1200 ml  Output    300 ml  Net    900 ml     Filed Weights   10/25/15 0310 10/25/15 2103  Weight: 72.122 kg (159 lb) 73.9 kg (162 lb 14.7 oz)    Exam:  Afebrile, VSS, not hypoxic  General:  Appears comfortable, calm. Cardiovascular: Regular rate and rhythm, no murmur, rub or gallop. No lower extremity edema. Respiratory: Clear to auscultation bilaterally, no wheezes, rales or rhonchi. Normal respiratory effort. Abdomen: soft, ntnd Musculoskeletal: grossly normal tone bilateral upper and lower extremities Psychiatric: grossly normal mood and affect, speech fluent and appropriate Neurologic: grossly non-focal.   New data reviewed:  Potassium 4.2  Creatinine 2.14, BUN 35  INR 3.34  Pertinent data since admission:  Salicylate level negative  Acetaminophen level negative  Pending data:  None  Scheduled Meds: . atorvastatin  40 mg Oral QHS  . carvedilol  12.5 mg Oral BID WC  . fenofibrate  160 mg Oral Daily  . insulin aspart  0-9 Units Subcutaneous TID WC  . sodium chloride flush  3 mL Intravenous  Q12H  . Warfarin - Pharmacist Dosing Inpatient   Does not apply Q24H   Continuous Infusions:    Principal Problem:   Suicidal overdose ([place]) Active Problems:   HTN (hypertension)   Diabetes ([place])   Atrial fibrillation ([place])   Pulmonary embolism ([place])   Depression   Time spent: 25 minutes   By signing my name below, I, Rosalie Doctor attest that this documentation has been prepared under the direction and in the presence of Murray Hodgkins, MD Electronically signed: Rosalie Doctor, Scribe.  10/26/2015 12:12pm    I personally performed the services described in this documentation. All medical record entries made by the scribe were at my direction. I have reviewed the chart and agree that the record reflects my personal performance and is accurate and complete. Murray Hodgkins, MD

## 2015-10-26 NOTE — Discharge Summary (Addendum)
Physician Discharge Summary  Julie Carpenter [id number] DOB: Nov 27, 1925 DOA: 10/24/2015  PCP: Eustaquio Maize, MD  Admit date: 10/24/2015 Discharge date: 10/30/2015  Recommendations for Outpatient Follow-up:  1. Follow-up depression 2. Daily PT/INR until stable. Goal 2-3 for afib and PMH DVT/PE  Follow-up Information    Follow up with Eustaquio Maize, MD In 1 month.   Specialty:  Pediatrics   Contact information:   [place] [address] [phone]      Discharge Diagnoses:  1. Suicide attempt with intentional overdose with Elavil. 2. Diabetes mellitus type 2 with hypoglycemia. 3. Atrial fibrillation, rate controlled. 4. Chronic kidney disease stage IV.  Discharge Condition: improved Disposition: inpatient geriatric-psychiatric   Diet recommendation: regular  Filed Weights   10/25/15 0310 10/25/15 2103  Weight: 72.122 kg (159 lb) 73.9 kg (162 lb 14.7 oz)    History of present illness:  82 year old woman presented with intentional overdose on Elavil when attempting kill her self.  Hospital Course:  The patient was admitted for monitoring and had no apparent sequela in regard to Elavil ingestion. She is cleared by poison control. She was evaluated by psychiatry who recommended inpatient geriatric psychiatric hospitalization. Hospitalization was prolonged by hypoglycemia which has subsequently resolved. No evidence of infection or other complicating problems.  1. Suicidal overdose, intentional ingestion of 16 Elavil 50mg  tablets. No apparent sequela. 2. DM type 2. Stable. Hypoglycemia has resolved and she is now medically stable. 3. Atrial fibrillation, stable, continue warfarin 4. CKD stage IV, creatinine at baseline 5. PMH PE 08/2015  Consultants:  Psychiatry   Discharge Instructions   Current Discharge Medication List    CONTINUE these medications which have CHANGED   Details  warfarin (COUMADIN) 2 MG tablet TAKE ONE-HALF TABLET BY MOUTH  ONCE DAILY IN THE EVENING      CONTINUE these medications which have NOT CHANGED   Details  atorvastatin (LIPITOR) 40 MG tablet TAKE ONE (1) TABLET EACH DAY Qty: 90 tablet, Refills: 1   Associated Diagnoses: Hyperlipemia    calcitRIOL (ROCALTROL) 0.25 MCG capsule Take 0.25 mcg by mouth 3 (three) times a week. Mon.,Wed.,Fri. Refills: 5    carvedilol (COREG) 12.5 MG tablet Take 1 tablet (12.5 mg total) by mouth 2 (two) times daily with a meal. Qty: 60 tablet, Refills: 0   Associated Diagnoses: Essential hypertension    epoetin alfa (EPOGEN,PROCRIT) 2000 UNIT/ML injection Inject 2,000 Units into the vein every 21 ( twenty-one) days. Patient has procrit administered at [facility] short stay every 3 weeks    fenofibrate (TRICOR) 145 MG tablet Take 1 tablet (145 mg total) by mouth daily. Qty: 90 tablet, Refills: 1   Associated Diagnoses: Hyperlipemia    furosemide (LASIX) 20 MG tablet Take 1 tablet (20 mg total) by mouth daily. Qty: 90 tablet, Refills: 1   Associated Diagnoses: Peripheral edema      STOP taking these medications     amitriptyline (ELAVIL) 50 MG tablet      glimepiride (AMARYL) 2 MG tablet        Allergies  Allergen Reactions  . Penicillins Anaphylaxis    Has patient had a PCN reaction causing immediate rash, facial/tongue/throat swelling, SOB or lightheadedness with hypotension: Yes Has patient had a PCN reaction causing severe rash involving mucus membranes or skin necrosis: No Has patient had a PCN reaction that required hospitalization No Has patient had a PCN reaction occurring within the last 10 years: No If all of the above answers are "NO",  then may proceed with Cephalosporin use.  Marland Kitchen Benicar [Olmesartan Medoxomil] Swelling    Lips swelling  . Ciprofloxacin Swelling  . Hctz [Hydrochlorothiazide] Swelling    Lips swelling  . Januvia [Sitagliptin Phosphate] Swelling    Lips swelling  . Norvasc [Amlodipine Besylate] Swelling    Lips swelling  .  Ramipril Swelling    Lips swelling    The results of significant diagnostics from this hospitalization (including imaging, microbiology, ancillary and laboratory) are listed below for reference.      Labs: Basic Metabolic Panel:  Recent Labs Lab 10/24/15 1413 10/26/15 0520 10/29/15 0630  NA 139 142 136  K 3.2* 4.2 4.5  CL 105 110 106  CO2 26 26 20*  GLUCOSE 193* 60* 91  BUN 31* 35* 55*  CREATININE 2.20* 2.14* 2.61*  CALCIUM 8.9 8.9 8.6*   Liver Function Tests:  Recent Labs Lab 10/24/15 1413 10/26/15 0520  AST 107* 82*  ALT 46 41  ALKPHOS 46 46  BILITOT 0.9 0.7  PROT 6.3* 5.7*  ALBUMIN 3.1* 2.8*   CBC:  Recent Labs Lab 10/24/15 1413  WBC 4.4  NEUTROABS 2.9  HGB 9.1*  HCT 28.2*  MCV 81.5  PLT 312    CBG:  Recent Labs Lab 10/29/15 1617 10/29/15 1756 10/29/15 2007 10/30/15 0015 10/30/15 0506  GLUCAP 168* 176* 173* 131* 118*    Principal Problem:   MDD (major depressive disorder), recurrent severe, without psychosis ([place]) Active Problems:   HTN (hypertension)   Diabetes (HCC)   Atrial fibrillation (HCC)   Pulmonary embolism (HCC)   Suicidal overdose ([place])   Depression   Chronic kidney disease (CKD), stage IV (severe) ([place])   Dementia with behavioral disturbance   Hypoglycemia   Time coordinating discharge: 35 minutes  Signed:  Murray Hodgkins, MD [facility] 10/30/2015, 11:09 AM

## 2015-10-27 DIAGNOSIS — F0391 Unspecified dementia with behavioral disturbance: Secondary | ICD-10-CM | POA: Diagnosis not present

## 2015-10-27 DIAGNOSIS — F332 Major depressive disorder, recurrent severe without psychotic features: Secondary | ICD-10-CM

## 2015-10-27 DIAGNOSIS — E11649 Type 2 diabetes mellitus with hypoglycemia without coma: Secondary | ICD-10-CM | POA: Diagnosis present

## 2015-10-27 DIAGNOSIS — T43012A Poisoning by tricyclic antidepressants, intentional self-harm, initial encounter: Secondary | ICD-10-CM | POA: Diagnosis present

## 2015-10-27 DIAGNOSIS — I2699 Other pulmonary embolism without acute cor pulmonale: Secondary | ICD-10-CM | POA: Diagnosis not present

## 2015-10-27 DIAGNOSIS — I4891 Unspecified atrial fibrillation: Secondary | ICD-10-CM | POA: Diagnosis present

## 2015-10-27 DIAGNOSIS — N184 Chronic kidney disease, stage 4 (severe): Secondary | ICD-10-CM | POA: Diagnosis not present

## 2015-10-27 DIAGNOSIS — E162 Hypoglycemia, unspecified: Secondary | ICD-10-CM | POA: Diagnosis present

## 2015-10-27 DIAGNOSIS — Z66 Do not resuscitate: Secondary | ICD-10-CM | POA: Diagnosis present

## 2015-10-27 DIAGNOSIS — Z86711 Personal history of pulmonary embolism: Secondary | ICD-10-CM | POA: Diagnosis not present

## 2015-10-27 DIAGNOSIS — Z7901 Long term (current) use of anticoagulants: Secondary | ICD-10-CM | POA: Diagnosis not present

## 2015-10-27 DIAGNOSIS — E1122 Type 2 diabetes mellitus with diabetic chronic kidney disease: Secondary | ICD-10-CM | POA: Diagnosis present

## 2015-10-27 DIAGNOSIS — T50902A Poisoning by unspecified drugs, medicaments and biological substances, intentional self-harm, initial encounter: Secondary | ICD-10-CM | POA: Diagnosis not present

## 2015-10-27 DIAGNOSIS — R410 Disorientation, unspecified: Secondary | ICD-10-CM | POA: Diagnosis present

## 2015-10-27 DIAGNOSIS — I129 Hypertensive chronic kidney disease with stage 1 through stage 4 chronic kidney disease, or unspecified chronic kidney disease: Secondary | ICD-10-CM | POA: Diagnosis present

## 2015-10-27 DIAGNOSIS — E785 Hyperlipidemia, unspecified: Secondary | ICD-10-CM | POA: Diagnosis present

## 2015-10-27 DIAGNOSIS — I481 Persistent atrial fibrillation: Secondary | ICD-10-CM | POA: Diagnosis not present

## 2015-10-27 DIAGNOSIS — T50902D Poisoning by unspecified drugs, medicaments and biological substances, intentional self-harm, subsequent encounter: Secondary | ICD-10-CM | POA: Diagnosis not present

## 2015-10-27 DIAGNOSIS — E876 Hypokalemia: Secondary | ICD-10-CM | POA: Diagnosis present

## 2015-10-27 LAB — GLUCOSE, CAPILLARY
GLUCOSE-CAPILLARY: 54 mg/dL — AB (ref 65–99)
GLUCOSE-CAPILLARY: 114 mg/dL — AB (ref 65–99)
GLUCOSE-CAPILLARY: 87 mg/dL (ref 65–99)
GLUCOSE-CAPILLARY: 43 mg/dL — AB (ref 65–99)
GLUCOSE-CAPILLARY: 67 mg/dL (ref 65–99)
GLUCOSE-CAPILLARY: 84 mg/dL (ref 65–99)
GLUCOSE-CAPILLARY: 96 mg/dL (ref 65–99)
GLUCOSE-CAPILLARY: 66 mg/dL (ref 65–99)
Glucose-Capillary: 43 mg/dL — CL (ref 65–99)
Glucose-Capillary: 43 mg/dL — CL (ref 65–99)
Glucose-Capillary: 50 mg/dL — ABNORMAL LOW (ref 65–99)
Glucose-Capillary: 54 mg/dL — ABNORMAL LOW (ref 65–99)
Glucose-Capillary: 66 mg/dL (ref 65–99)
Glucose-Capillary: 72 mg/dL (ref 65–99)
Glucose-Capillary: 74 mg/dL (ref 65–99)
Glucose-Capillary: 82 mg/dL (ref 65–99)

## 2015-10-27 LAB — PROTIME-INR
INR: 2.17 — AB (ref 0.00–1.49)
Prothrombin Time: 24 seconds — ABNORMAL HIGH (ref 11.6–15.2)

## 2015-10-27 MED ORDER — WARFARIN SODIUM 1 MG PO TABS
1.0000 mg | ORAL_TABLET | Freq: Once | ORAL | Status: AC
Start: 2015-10-27 — End: 2015-10-27
  Administered 2015-10-27: 1 mg via ORAL
  Filled 2015-10-27: qty 1

## 2015-10-27 MED ORDER — DEXTROSE 50 % IV SOLN
25.0000 mL | Freq: Once | INTRAVENOUS | Status: AC
  Administered 2015-10-27: 25 mL via INTRAVENOUS

## 2015-10-27 MED ORDER — DEXTROSE 50 % IV SOLN
INTRAVENOUS | Status: AC
  Filled 2015-10-27: qty 50

## 2015-10-27 MED ORDER — GLUCOSE 40 % PO GEL
ORAL | Status: AC
  Administered 2015-10-27: 37.5 g via ORAL
  Filled 2015-10-27: qty 1

## 2015-10-27 MED ORDER — DEXTROSE 5 % IV SOLN
INTRAVENOUS | Status: DC
  Administered 2015-10-27: 07:00:00 via INTRAVENOUS

## 2015-10-27 MED ORDER — DEXTROSE 50 % IV SOLN
INTRAVENOUS | Status: AC
  Administered 2015-10-27: 25 mL via INTRAVENOUS
  Filled 2015-10-27: qty 50

## 2015-10-27 MED ORDER — GLUCOSE 40 % PO GEL: 1.0000 | Freq: Once | ORAL | Status: AC

## 2015-10-27 MED ORDER — GLUCOSE 40 % PO GEL
1.0000 | Freq: Once | ORAL | Status: AC
  Administered 2015-10-27: 37.5 g via ORAL

## 2015-10-27 MED ORDER — DEXTROSE-NACL 5-0.45 % IV SOLN
INTRAVENOUS | Status: DC
  Administered 2015-10-28: via INTRAVENOUS

## 2015-10-27 MED ORDER — GLUCOSE 40 % PO GEL
ORAL | Status: AC
  Filled 2015-10-27: qty 1

## 2015-10-27 MED ORDER — DEXTROSE 50 % IV SOLN
25.0000 mL | Freq: Once | INTRAVENOUS | Status: AC
  Administered 2015-10-27 (×2): 25 mL via INTRAVENOUS

## 2015-10-27 NOTE — Progress Notes (Signed)
Pamala Hurry, intake RN at [facility] called requesting repeat Protime-INR.  Writer provided most recent lab which resulted 10/27/15 a.m. Pamala Hurry states admitting MD will be reviewing.  Sharren Bridge, MSW, LCSW Clinical Social Work, Disposition  10/27/2015 [phone]

## 2015-10-27 NOTE — Progress Notes (Addendum)
Hypoglycemic Event  CBG: 66  Treatment: 2 grape juices, and started eating dinner  Symptoms: None  Follow-up CBG: Time:1700 CBG Result:50   Treatment: glucose gel  2nd Follow-up CBG: Time: 1800     CBG result: 66  Treatment: glucose gel and 1 orange juice  3rd Follow-up CBG Time:     CBG result: 82  Possible Reasons for Event: unknown.  Comments/MD notified: Dr. Sarajane Jews Pt stable   Cathyann Kilfoyle N Yasemin Rabon

## 2015-10-27 NOTE — Progress Notes (Signed)
Patient had a CBG of 43. After giving juice x2. CBG only came up to 54. 1/2 amp of D50 was given iv. CBG now 114. Paged on call MD to notify about this. Awaiting response. Will continue to monitor.

## 2015-10-27 NOTE — Progress Notes (Signed)
Marlowe Kays at [facility] states that pt may have bed at [facility] as early as tomorrow morning 10/28/15. However, pt not officially accepted as of yet due to needing: IVC paperwork Physician progress note dated today 10/27/15 and d/c summary for tomorrow morning 10/28/15  Spoke with RN informing of these requests from [facility] and left voicemail for [facility] CSW.   Will continue following placement.  Sharren Bridge, MSW, LCSW Clinical Social Work, Disposition  10/27/2015 [phone]

## 2015-10-27 NOTE — Progress Notes (Addendum)
Continuing with gero-psychiatric placement efforts.   10/27/15- [facility]- per Maudie Mercury referral queue is being reviewed, however priority is being given to local patients. Will call when reviewed [facility]- per Vinnie Level- referral yet to be reviewed- unknown what bed availability will be for today as there are several patients in ED awaiting beds  At capacity: [facility]- until 2/18 at earliest [facility]- per [facility]- per Verdis Frederickson- per Maebelle Munroe- per Carlyon Shadow  Declined: [facility]- per Pamala Hurry due to dementia dx [facility]- per Larkin Ina due to dementia dx and stage IV renal disease [facility]- per Ronalee Belts, same as [facility]- per Kalman Shan, Coumadin exclusionary [facility]- per Nunzio Cory, medical instability. However, Shirlee Limerick (intake RN) states they will hold on to referral and encouraged [place] to contact them again if pt's renal disease is addressed and hypoglycemic episodes stabilize.  Completed regional hospital referral form. Faxed to [facility] seeking authorization number needed to pursue referral to [place] call from [place], MSW, LCSW Clinical Social Work, Disposition  10/27/2015 [phone]  12:30- [facility] clinician Tiffany Kocher provided authorization # [id number] for referral.  [facility] and gave verbal referral to [facility], [place]. Faxed referral for review.   Sharren Bridge, MSW, LCSW Clinical Social Work, Disposition  10/27/2015 [phone]

## 2015-10-27 NOTE — Progress Notes (Signed)
[place] for coumadin Indication: atrial fibrillation  Allergies  Allergen Reactions  . Penicillins Anaphylaxis    Has patient had a PCN reaction causing immediate rash, facial/tongue/throat swelling, SOB or lightheadedness with hypotension: Yes Has patient had a PCN reaction causing severe rash involving mucus membranes or skin necrosis: No Has patient had a PCN reaction that required hospitalization No Has patient had a PCN reaction occurring within the last 10 years: No If all of the above answers are "NO", then may proceed with Cephalosporin use.  Marland Kitchen Benicar [Olmesartan Medoxomil] Swelling    Lips swelling  . Ciprofloxacin Swelling  . Hctz [Hydrochlorothiazide] Swelling    Lips swelling  . Januvia [Sitagliptin Phosphate] Swelling    Lips swelling  . Norvasc [Amlodipine Besylate] Swelling    Lips swelling  . Ramipril Swelling    Lips swelling    Patient Measurements: Height: 5\' 4"  (162.6 cm) Weight: 162 lb 14.7 oz (73.9 kg) IBW/kg (Calculated) : 54.7  Vital Signs: Temp: 98.3 F (36.8 C) (02/17 1440) Temp Source: Oral (02/17 1440) BP: 110/69 mmHg (02/17 1440) Pulse Rate: 77 (02/17 1440)  Labs:  Recent Labs  10/25/15 0726 10/26/15 0520 10/27/15 0514  LABPROT 36.5* 33.1* 24.0*  INR 3.79* 3.34* 2.17*  CREATININE  --  2.14*  --     Estimated Creatinine Clearance: 18.2 mL/min (by C-G formula based on Cr of 2.14).   Medical History: Past Medical History  Diagnosis Date  . Hyperlipidemia   . Hypertension   . Diabetes mellitus without complication ([place])   . Atrial fibrillation (HCC)     Medications:  Prescriptions prior to admission  Medication Sig Dispense Refill Last Dose  . amitriptyline (ELAVIL) 50 MG tablet Take 50 mg by mouth at bedtime as needed for sleep.   10/24/2015 at Unknown time  . atorvastatin (LIPITOR) 40 MG tablet TAKE ONE (1) TABLET EACH DAY (Patient taking differently: Take 40 mg by mouth at bedtime. )  90 tablet 1 10/23/2015 at Unknown time  . calcitRIOL (ROCALTROL) 0.25 MCG capsule Take 0.25 mcg by mouth 3 (three) times a week. Mon.,Wed.,Fri.  5 10/23/2015 at Unknown time  . carvedilol (COREG) 12.5 MG tablet Take 1 tablet (12.5 mg total) by mouth 2 (two) times daily with a meal. 60 tablet 0 10/24/2015 at 930A  . epoetin alfa (EPOGEN,PROCRIT) 2000 UNIT/ML injection Inject 2,000 Units into the vein every 21 ( twenty-one) days. Patient has procrit administered at [facility] short stay every 3 weeks   10/18/2015 at Unknown time  . fenofibrate (TRICOR) 145 MG tablet Take 1 tablet (145 mg total) by mouth daily. 90 tablet 1 10/23/2015 at Unknown time  . furosemide (LASIX) 20 MG tablet Take 1 tablet (20 mg total) by mouth daily. (Patient taking differently: Take 20-40 mg by mouth daily. ) 90 tablet 1 10/24/2015 at Unknown time  . glimepiride (AMARYL) 2 MG tablet Take 1.5 tablets (3 mg total) by mouth daily before breakfast. (Patient taking differently: Take 2 mg by mouth daily before breakfast. ) 135 tablet 1 10/24/2015 at Unknown time  . [DISCONTINUED] warfarin (COUMADIN) 2 MG tablet TAKE ONE (1) TABLET EACH DAY (Patient taking differently: TAKE ONE-HALF TABLET BY MOUTH ONCE DAILY IN THE EVENING) 30 tablet 2 10/23/2015 at 1800    Assessment: 82 yo lady to continue coumadin for afib.  INR today 2.17  Goal of Therapy:  INR 2-3 Monitor platelets by anticoagulation protocol: Yes   Plan:  Coumadin 1 mg po today.  Daily PT/INR Monitor for bleeding complications  Excell Seltzer [place] 10/27/2015,3:09 PM

## 2015-10-27 NOTE — Care Management Important Message (Signed)
Important Message  Patient Details  Name: Julie Carpenter MRN: [id number] Date of Birth: June 21, 1926   Medicare Important Message Given:  Yes    Alvie Heidelberg, RN 10/27/2015, 4:33 PM

## 2015-10-27 NOTE — Progress Notes (Signed)
Per RN Lattie Haw, ED to work on the [employer] and physician has been informed of the [facility] request for a note with regards to pt being stable for d/c.  CSW will continue to follow up.  Verlon Setting, [place] Disposition staff 10/27/2015 3:54 PM

## 2015-10-27 NOTE — Progress Notes (Signed)
Patient had CBG of 43. 1/2 amp of D50 given. CBG is now 87. Paged hospitalist. Order received to start patient on D5@40cc /hr. Also, check CBGs q2h. Will continue to monitor closely.

## 2015-10-27 NOTE — Progress Notes (Addendum)
Hypoglycemic Event  CBG: 43  Treatment: Glucose gel, eating breakfast, Regular coke given  Symptoms: None  Follow-up CBG: [id number] CBG Result:63  Possible Reasons for Event: Unknown  Comments/MD notified:   Matilde Sprang

## 2015-10-27 NOTE — Progress Notes (Addendum)
Writer followed with placement efforts at [facility]. [facility] requested IVC, note from MD that patient is stable on 2/17, and another discharge note from MD on 2/18. Per RN Phineas Real, patient has not been stable this evening due to low blood sugar and MD will reassess patient on 2/18 in the am for stability. Also, per RN Phineas Real, IVC papers can not be completed at this time due patient not being medically cleared.  This Probation officer spoke with RN Truman Hayward at [facility] and informed that pt had low blood sugar this evening and therefore she could not be medically cleared. Writer informed [place] RN Truman Hayward that [facility] RN believes that patient will be stable tomorrow. RN Truman Hayward stated that she will hold bed for patient and that [place] needs to receive: patient's IVC, note from MD that patient is stable and another note from MD that patient is ready for discharge.  [facility] CSW will follow up with placement efforts in the am on 2/18. [facility]-Dept 300 RN Phineas Real has been informed.   Verlon Setting, [place] Disposition staff 10/27/2015 9:47 PM

## 2015-10-27 NOTE — Progress Notes (Addendum)
PROGRESS NOTE  Julie Carpenter [id number] DOB: [birth year] DOA: 10/24/2015 PCP: Eustaquio Maize, MD  Summary: 4 yof with PMH of CKD, DM, HTN, HLD, afib and lower GI bleed presented with complaints of intentional overdose on Elavil. It is reported that she told her daughter "I dont want to live like this anymore" and proceeded to ingest 16 Elavil 50mg  tablets. In the ED she was noted to be in afib with rate controlled, QTc of 462ms, Potassium 3.2, Creatinine 2.20. Tylenol level and toxic  Screen were both negative. Poison control center recommended 12 hours of monitoring. Hospitalist was asked to admit her for monitoring of TCA overdose and for psychiatric consultation  Assessment/Plan: 1. Suicidal overdose, intentional ingestion of 16 Elavil 50mg  tablets. No apparent sequela. Case closed by poison control. No further evaluation suggested. 2. Atrial fibrillation, rate controlled. Warfarin on hold due to high INR. 3. Minimal elevation of AST, resolved. Clinically insignificant. 4. Essential HTN, stable. 5. DM type 2. Patient has had a few episodes of hypoglycemia since admission. Improved with D5 saline. No evidence of infection. 6. CKD stage IV, creatinine at baseline.  7. PMH PE 08/2015   . No apparent sequela from overdose. No further investigation or monitoring indicated.  Only current medical issues hypoglycemia. IV fluids have been discontinued. We'll continue to follow blood sugars. As long as does not have significant hypoglycemia, will be medically clear for discharge to psychiatric inpatient facility 2/18.   Code Status: DNR DVT prophylaxis: Warfarin Family Communication: Daughter present at bedside Disposition Plan: Inpatient psychiatry bed.   Murray Hodgkins, MD  [facility]  Pager [phone] If 7PM-7AM, please contact night-coverage at [email or web address], password [facility] 10/27/2015, 7:50 AM     Consultants:  Psychiatry  Procedures:  None   Antibiotics:  None  HPI/Subjective: Feels okay. Denies any pain, SOB, nausea, or vomiting. Ate about 75% of her breakfast. No symptoms with hypoglycemia. Daughter at bedside reports some hypoglycemia at home.  Objective: Filed Vitals:   10/26/15 1444 10/26/15 1715 10/26/15 2055 10/27/15 0717  BP: 117/67 117/67 112/70 112/67  Pulse: 84 88 92 86  Temp: 98.6 F (37 C)  98.4 F (36.9 C) 97.3 F (36.3 C)  TempSrc: Oral  Oral Oral  Resp: 20  20 18   Height:      Weight:      SpO2: 100%  100% 100%    Intake/Output Summary (Last 24 hours) at 10/27/15 0750 Last data filed at 10/26/15 1829  Gross per 24 hour  Intake   1330 ml  Output    300 ml  Net   1030 ml     Filed Weights   10/25/15 0310 10/25/15 2103  Weight: 72.122 kg (159 lb) 73.9 kg (162 lb 14.7 oz)    Exam:  Afebrile, VSS, not hypoxic  General:  Appears calm and comfortable Cardiovascular: RRR, no m/r/g. 1-2+ BLE edema. Respiratory: CTA bilaterally, no w/r/r. Normal respiratory effort. Psychiatric: grossly normal mood and affect, speech fluent and appropriate Neurologic: grossly non-focal.  New data reviewed:  INR 2.17  CBg low this AM and last evening  Scheduled Meds: . atorvastatin  40 mg Oral QHS  . carvedilol  12.5 mg Oral BID WC  . fenofibrate  160 mg Oral Daily  . furosemide  20 mg Oral Daily  . sodium chloride flush  3 mL Intravenous Q12H  . Warfarin - Pharmacist Dosing Inpatient   Does not apply Q24H   Continuous Infusions: . dextrose 40 mL/hr at 10/27/15 [phone]  Principal Problem:   MDD (major depressive disorder), recurrent severe, without psychosis ([place]) Active Problems:   HTN (hypertension)   Diabetes ([place])   Atrial fibrillation ([place])   Pulmonary embolism ([place])   Suicidal overdose ([place])   Depression   Chronic kidney disease (CKD), stage IV (severe) ([place])   Dementia with behavioral disturbance   Time spent: 25  minutes   By signing my name below, I, Rosalie Doctor attest that this documentation has been prepared under the direction and in the presence of Murray Hodgkins, MD Electronically signed: Rosalie Doctor, Scribe.  10/27/2015 10:21am  I personally performed the services described in this documentation. All medical record entries made by the scribe were at my direction. I have reviewed the chart and agree that the record reflects my personal performance and is accurate and complete. Murray Hodgkins, MD

## 2015-10-27 NOTE — Progress Notes (Signed)
Hypoglycemic Event  CBG: 63  Treatment: Glucose gel  Symptoms: None  Follow-up CBG: [id number] CBG Result:74  Possible Reasons for Event: Unknown  Comments/MD notified: N/A   Julie Carpenter

## 2015-10-28 DIAGNOSIS — E162 Hypoglycemia, unspecified: Secondary | ICD-10-CM

## 2015-10-28 LAB — GLUCOSE, CAPILLARY
GLUCOSE-CAPILLARY: 115 mg/dL — AB (ref 65–99)
GLUCOSE-CAPILLARY: 74 mg/dL (ref 65–99)
GLUCOSE-CAPILLARY: 72 mg/dL (ref 65–99)
GLUCOSE-CAPILLARY: 86 mg/dL (ref 65–99)
GLUCOSE-CAPILLARY: 88 mg/dL (ref 65–99)
GLUCOSE-CAPILLARY: 96 mg/dL (ref 65–99)
GLUCOSE-CAPILLARY: 85 mg/dL (ref 65–99)
Glucose-Capillary: 113 mg/dL — ABNORMAL HIGH (ref 65–99)
Glucose-Capillary: 44 mg/dL — CL (ref 65–99)
Glucose-Capillary: 61 mg/dL — ABNORMAL LOW (ref 65–99)
Glucose-Capillary: 70 mg/dL (ref 65–99)
Glucose-Capillary: 70 mg/dL (ref 65–99)
Glucose-Capillary: 84 mg/dL (ref 65–99)

## 2015-10-28 LAB — PROTIME-INR
INR: 1.62 — ABNORMAL HIGH (ref 0.00–1.49)
PROTHROMBIN TIME: 19.3 s — AB (ref 11.6–15.2)

## 2015-10-28 MED ORDER — DEXTROSE 50 % IV SOLN
INTRAVENOUS | Status: AC
  Administered 2015-10-28: 50 mL
  Filled 2015-10-28: qty 50

## 2015-10-28 MED ORDER — WARFARIN SODIUM 1 MG PO TABS
1.0000 mg | ORAL_TABLET | Freq: Once | ORAL | Status: AC
  Administered 2015-10-28: 1 mg via ORAL
  Filled 2015-10-28: qty 1

## 2015-10-28 NOTE — Progress Notes (Signed)
Disposition CSW contacted the [facility] Admissions Nurse to inform her that the patient still not cleared by Attending, but if her Glucose levels remain stable she will be ready for transfer tomorrow.  [facility] Nurse stated that she would need the D/C Summary and IVC paperwork and to call first thing in the morning as she cannot hold a bed.  CSW will check bed availability tomorrow assuming patient is cleared for transfer.  [place] Disposition CSW [phone]

## 2015-10-28 NOTE — Progress Notes (Signed)
[place] for coumadin Indication: atrial fibrillation  Allergies  Allergen Reactions  . Penicillins Anaphylaxis    Has patient had a PCN reaction causing immediate rash, facial/tongue/throat swelling, SOB or lightheadedness with hypotension: Yes Has patient had a PCN reaction causing severe rash involving mucus membranes or skin necrosis: No Has patient had a PCN reaction that required hospitalization No Has patient had a PCN reaction occurring within the last 10 years: No If all of the above answers are "NO", then may proceed with Cephalosporin use.  Marland Kitchen Benicar [Olmesartan Medoxomil] Swelling    Lips swelling  . Ciprofloxacin Swelling  . Hctz [Hydrochlorothiazide] Swelling    Lips swelling  . Januvia [Sitagliptin Phosphate] Swelling    Lips swelling  . Norvasc [Amlodipine Besylate] Swelling    Lips swelling  . Ramipril Swelling    Lips swelling    Patient Measurements: Height: 5\' 4"  (162.6 cm) Weight: 162 lb 14.7 oz (73.9 kg) IBW/kg (Calculated) : 54.7  Vital Signs: Temp: 98 F (36.7 C) (02/18 0706) Temp Source: Oral (02/18 0706) BP: 136/69 mmHg (02/18 0706) Pulse Rate: 97 (02/18 0706)  Labs:  Recent Labs  10/26/15 0520 10/27/15 0514 10/28/15 0720  LABPROT 33.1* 24.0* 19.3*  INR 3.34* 2.17* 1.62*  CREATININE 2.14*  --   --     Estimated Creatinine Clearance: 18.2 mL/min (by C-G formula based on Cr of 2.14).   Medical History: Past Medical History  Diagnosis Date  . Hyperlipidemia   . Hypertension   . Diabetes mellitus without complication ([place])   . Atrial fibrillation (HCC)     Medications:  Prescriptions prior to admission  Medication Sig Dispense Refill Last Dose  . amitriptyline (ELAVIL) 50 MG tablet Take 50 mg by mouth at bedtime as needed for sleep.   10/24/2015 at Unknown time  . atorvastatin (LIPITOR) 40 MG tablet TAKE ONE (1) TABLET EACH DAY (Patient taking differently: Take 40 mg by mouth at bedtime. ) 90  tablet 1 10/23/2015 at Unknown time  . calcitRIOL (ROCALTROL) 0.25 MCG capsule Take 0.25 mcg by mouth 3 (three) times a week. Mon.,Wed.,Fri.  5 10/23/2015 at Unknown time  . carvedilol (COREG) 12.5 MG tablet Take 1 tablet (12.5 mg total) by mouth 2 (two) times daily with a meal. 60 tablet 0 10/24/2015 at 930A  . epoetin alfa (EPOGEN,PROCRIT) 2000 UNIT/ML injection Inject 2,000 Units into the vein every 21 ( twenty-one) days. Patient has procrit administered at [facility] short stay every 3 weeks   10/18/2015 at Unknown time  . fenofibrate (TRICOR) 145 MG tablet Take 1 tablet (145 mg total) by mouth daily. 90 tablet 1 10/23/2015 at Unknown time  . furosemide (LASIX) 20 MG tablet Take 1 tablet (20 mg total) by mouth daily. (Patient taking differently: Take 20-40 mg by mouth daily. ) 90 tablet 1 10/24/2015 at Unknown time  . glimepiride (AMARYL) 2 MG tablet Take 1.5 tablets (3 mg total) by mouth daily before breakfast. (Patient taking differently: Take 2 mg by mouth daily before breakfast. ) 135 tablet 1 10/24/2015 at Unknown time  . [DISCONTINUED] warfarin (COUMADIN) 2 MG tablet TAKE ONE (1) TABLET EACH DAY (Patient taking differently: TAKE ONE-HALF TABLET BY MOUTH ONCE DAILY IN THE EVENING) 30 tablet 2 10/23/2015 at 1800    Assessment: 82 yo lady to continue coumadin for afib. Admitted with elevated INR. Coumadin 1 mg given 2/17 after holding since 10/23/15. INR today 1.62   Goal of Therapy:  INR 2-3 Monitor platelets by  anticoagulation protocol: Yes   Plan:  Coumadin 1 mg po today (home regiment)  Daily PT/INR Monitor for bleeding complications  Abner Greenspan, Selah Klang Bennett 10/28/2015,11:44 AM

## 2015-10-28 NOTE — Progress Notes (Signed)
PROGRESS NOTE  Julie Carpenter [id number] DOB: February 19, 1926 DOA: 10/24/2015 PCP: Eustaquio Maize, MD  Summary: 76 yof with PMH of CKD, DM, HTN, HLD, afib and lower GI bleed presented with complaints of intentional overdose on Elavil. It is reported that she told her daughter "I dont want to live like this anymore" and proceeded to ingest 16 Elavil 50mg  tablets. In the ED she was noted to be in afib with rate controlled, QTc of 442ms, Potassium 3.2, Creatinine 2.20. Tylenol level and toxic  Screen were both negative. Poison control center recommended 12 hours of monitoring. Hospitalist was asked to admit her for monitoring of TCA overdose and for psychiatric consultation  Assessment/Plan: 1. Suicidal overdose, intentional ingestion of 16 Elavil 50mg  tablets. No apparent sequela. Case closed by poison control.  2. DM type 2. Patient has had a few episodes of hypoglycemia since admission. Improved with D5 saline however still fluctuate. No evidence of infection. 3. Atrial fibrillation, rate controlled. On warfarin 4. CKD stage IV, creatinine at baseline.  5. PMH PE 08/2015   No apparent sequela from overdose. No further investigation or monitoring indicated.  Intermittent hypoglycemia occurred. Last several checks stable off IV fluids. We'll continue to monitor today. As long as hypoglycemia does not recur, medically clear for transfer to psychiatric facility 2/19. Etiology of hypoglycemia is unclear, she is on no anti-hyperglycemic agents and is eating well. There is no evidence of infection.   Code Status: DNR DVT prophylaxis: Warfarin Family Communication: Daughter present at bedside Disposition Plan: Inpatient psychiatry bed.   Murray Hodgkins, MD  [facility]  Pager [phone] If 7PM-7AM, please contact night-coverage at [email or web address], password [facility] 10/28/2015, 7:54 AM  LOS: 1 day    Consultants:  Psychiatry  Procedures:  None   Antibiotics:  None  HPI/Subjective: Feels good. Denies any abdominal pain, nausea, or vomiting. Does not have any pain.   Objective: Filed Vitals:   10/27/15 1440 10/27/15 1715 10/27/15 2323 10/28/15 0706  BP: 110/69 115/73 101/62 136/69  Pulse: 77 92 89 97  Temp: 98.3 F (36.8 C)  98 F (36.7 C) 98 F (36.7 C)  TempSrc: Oral  Oral Oral  Resp: 18  18 18   Height:      Weight:      SpO2: 99%  99% 97%    Intake/Output Summary (Last 24 hours) at 10/28/15 0754 Last data filed at 10/27/15 0837  Gross per 24 hour  Intake    240 ml  Output    350 ml  Net   -110 ml     Filed Weights   10/25/15 0310 10/25/15 2103  Weight: 72.122 kg (159 lb) 73.9 kg (162 lb 14.7 oz)    Exam:  Afebrile, VSS, not hypoxic  General:  Appears comfortable, calm. Cardiovascular: Regular rate and rhythm, no murmur, rub or gallop. 2+ lower extremity edema. Respiratory: Clear to auscultation bilaterally, no wheezes, rales or rhonchi. Normal respiratory effort. Abdomen: soft, ntnd Psychiatric: grossly normal mood and affect, speech fluent and appropriate Neurologic: grossly non-focal.  New data reviewed:  Last episode of hypoglycemia midnight 2/18, however was on D5 until 10 AM  Scheduled Meds: . atorvastatin  40 mg Oral QHS  . fenofibrate  160 mg Oral Daily  . furosemide  20 mg Oral Daily  . sodium chloride flush  3 mL Intravenous Q12H  . Warfarin - Pharmacist Dosing Inpatient   Does not apply Q24H   Continuous Infusions: . dextrose 5 % and 0.45% NaCl 50 mL/hr at  10/28/15 0023    Principal Problem:   MDD (major depressive disorder), recurrent severe, without psychosis ([place]) Active Problems:   HTN (hypertension)   Diabetes ([place])   Atrial fibrillation ([place])   Pulmonary embolism ([place])   Suicidal overdose ([place])   Depression   Chronic kidney disease (CKD), stage IV (severe) ([place])   Dementia with behavioral disturbance    Hypoglycemia   Time spent: 25 minutes   By signing my name below, I, Rosalie Doctor attest that this documentation has been prepared under the direction and in the presence of Murray Hodgkins, MD  Electronically signed: Rosalie Doctor, Scribe.  10/28/2015  1:01pm  I personally performed the services described in this documentation. All medical record entries made by the scribe were at my direction. I have reviewed the chart and agree that the record reflects my personal performance and is accurate and complete. Murray Hodgkins, MD

## 2015-10-29 LAB — GLUCOSE, CAPILLARY
GLUCOSE-CAPILLARY: 95 mg/dL (ref 65–99)
GLUCOSE-CAPILLARY: 63 mg/dL — AB (ref 65–99)
GLUCOSE-CAPILLARY: 168 mg/dL — AB (ref 65–99)
GLUCOSE-CAPILLARY: 176 mg/dL — AB (ref 65–99)
GLUCOSE-CAPILLARY: 173 mg/dL — AB (ref 65–99)
Glucose-Capillary: 63 mg/dL — ABNORMAL LOW (ref 65–99)
Glucose-Capillary: 100 mg/dL — ABNORMAL HIGH (ref 65–99)
Glucose-Capillary: 110 mg/dL — ABNORMAL HIGH (ref 65–99)
Glucose-Capillary: 188 mg/dL — ABNORMAL HIGH (ref 65–99)

## 2015-10-29 LAB — BASIC METABOLIC PANEL
Anion gap: 10 (ref 5–15)
BUN: 55 mg/dL — ABNORMAL HIGH (ref 6–20)
CHLORIDE: 106 mmol/L (ref 101–111)
CO2: 20 mmol/L — AB (ref 22–32)
CREATININE: 2.61 mg/dL — AB (ref 0.44–1.00)
Calcium: 8.6 mg/dL — ABNORMAL LOW (ref 8.9–10.3)
GFR calc non Af Amer: 15 mL/min — ABNORMAL LOW (ref >60)
GFR, EST AFRICAN AMERICAN: 18 mL/min — AB (ref >60)
Glucose, Bld: 91 mg/dL (ref 65–99)
Potassium: 4.5 mmol/L (ref 3.5–5.1)
Sodium: 136 mmol/L (ref 135–145)

## 2015-10-29 LAB — PROTIME-INR
INR: 1.56 — ABNORMAL HIGH (ref 0.00–1.49)
Prothrombin Time: 18.7 seconds — ABNORMAL HIGH (ref 11.6–15.2)

## 2015-10-29 MED ORDER — WARFARIN SODIUM 2 MG PO TABS
2.0000 mg | ORAL_TABLET | Freq: Once | ORAL | Status: AC
  Administered 2015-10-29: 2 mg via ORAL
  Filled 2015-10-29: qty 1

## 2015-10-29 NOTE — Progress Notes (Signed)
[place] for coumadin Indication: atrial fibrillation  Allergies  Allergen Reactions  . Penicillins Anaphylaxis    Has patient had a PCN reaction causing immediate rash, facial/tongue/throat swelling, SOB or lightheadedness with hypotension: Yes Has patient had a PCN reaction causing severe rash involving mucus membranes or skin necrosis: No Has patient had a PCN reaction that required hospitalization No Has patient had a PCN reaction occurring within the last 10 years: No If all of the above answers are "NO", then may proceed with Cephalosporin use.  Marland Kitchen Benicar [Olmesartan Medoxomil] Swelling    Lips swelling  . Ciprofloxacin Swelling  . Hctz [Hydrochlorothiazide] Swelling    Lips swelling  . Januvia [Sitagliptin Phosphate] Swelling    Lips swelling  . Norvasc [Amlodipine Besylate] Swelling    Lips swelling  . Ramipril Swelling    Lips swelling    Patient Measurements: Height: 5\' 4"  (162.6 cm) Weight: 162 lb 14.7 oz (73.9 kg) IBW/kg (Calculated) : 54.7  Vital Signs: Temp: 98.8 F (37.1 C) (02/19 0605) Temp Source: Oral (02/19 0605) BP: 111/59 mmHg (02/19 0605) Pulse Rate: 84 (02/19 0605)  Labs:  Recent Labs  10/27/15 0514 10/28/15 0720 10/29/15 0630  LABPROT 24.0* 19.3* 18.7*  INR 2.17* 1.62* 1.56*  CREATININE  --   --  2.61*    Estimated Creatinine Clearance: 15 mL/min (by C-G formula based on Cr of 2.61).   Medical History: Past Medical History  Diagnosis Date  . Hyperlipidemia   . Hypertension   . Diabetes mellitus without complication ([place])   . Atrial fibrillation (HCC)     Medications:  Prescriptions prior to admission  Medication Sig Dispense Refill Last Dose  . amitriptyline (ELAVIL) 50 MG tablet Take 50 mg by mouth at bedtime as needed for sleep.   10/24/2015 at Unknown time  . atorvastatin (LIPITOR) 40 MG tablet TAKE ONE (1) TABLET EACH DAY (Patient taking differently: Take 40 mg by mouth at bedtime. ) 90  tablet 1 10/23/2015 at Unknown time  . calcitRIOL (ROCALTROL) 0.25 MCG capsule Take 0.25 mcg by mouth 3 (three) times a week. Mon.,Wed.,Fri.  5 10/23/2015 at Unknown time  . carvedilol (COREG) 12.5 MG tablet Take 1 tablet (12.5 mg total) by mouth 2 (two) times daily with a meal. 60 tablet 0 10/24/2015 at 930A  . epoetin alfa (EPOGEN,PROCRIT) 2000 UNIT/ML injection Inject 2,000 Units into the vein every 21 ( twenty-one) days. Patient has procrit administered at [facility] short stay every 3 weeks   10/18/2015 at Unknown time  . fenofibrate (TRICOR) 145 MG tablet Take 1 tablet (145 mg total) by mouth daily. 90 tablet 1 10/23/2015 at Unknown time  . furosemide (LASIX) 20 MG tablet Take 1 tablet (20 mg total) by mouth daily. (Patient taking differently: Take 20-40 mg by mouth daily. ) 90 tablet 1 10/24/2015 at Unknown time  . glimepiride (AMARYL) 2 MG tablet Take 1.5 tablets (3 mg total) by mouth daily before breakfast. (Patient taking differently: Take 2 mg by mouth daily before breakfast. ) 135 tablet 1 10/24/2015 at Unknown time  . [DISCONTINUED] warfarin (COUMADIN) 2 MG tablet TAKE ONE (1) TABLET EACH DAY (Patient taking differently: TAKE ONE-HALF TABLET BY MOUTH ONCE DAILY IN THE EVENING) 30 tablet 2 10/23/2015 at 1800    Assessment: 82 yo lady to continue coumadin for afib. Admitted with elevated INR. Coumadin 1 mg given 2/17 after holding since 10/23/15. INR today 1.56   Goal of Therapy:  INR 2-3 Monitor platelets by  anticoagulation protocol: Yes   Plan:  Coumadin 2 mg po today (increase dose to increase INR)  Daily PT/INR Monitor for bleeding complications  Abner Greenspan, Krisinda Giovanni Bennett 10/29/2015,12:34 PM

## 2015-10-29 NOTE — Progress Notes (Signed)
PROGRESS NOTE  Julie Carpenter [id number] DOB: Sep 29, 1925 DOA: 10/24/2015 PCP: Eustaquio Maize, MD  Summary: 31 yof with PMH of CKD, DM, HTN, HLD, afib and lower GI bleed presented with complaints of intentional overdose on Elavil. It is reported that she told her daughter "I dont want to live like this anymore" and proceeded to ingest 16 Elavil 50mg  tablets. In the ED she was noted to be in afib with rate controlled, QTc of 457ms, Potassium 3.2, Creatinine 2.20. Tylenol level and toxic  Screen were both negative. Poison control center recommended 12 hours of monitoring. Hospitalist was asked to admit her for monitoring of TCA overdose and for psychiatric consultation  Assessment/Plan: 1. Suicidal overdose, intentional ingestion of 16 Elavil 50mg  tablets. No apparent sequela. 2. DM type 2. Has had recurrent hypoglycemia over the last 48 hours precluding discharge. Appears to be stabilizing at this point. She has no evidence of infection, is taking no medications currently and explanation for hypoglycemia is unclear given excelent Oral intake. Not clearly related to Elavil ingestion. Continue monitor clinically if any further hypoglycemia we'll consult endocrinology 2/20. 3. Atrial fibrillation, stable, continue warfarin 4. CKD stage IV, creatinine at baseline 5. PMH PE 08/2015   Continue to trend CBGs, If remains stable, discharge to [facility].    Code Status: DNR DVT prophylaxis: Warfarin Family Communication: Daughters present at bedside. Disposition Plan: Inpatient psychiatry bed.   Murray Hodgkins, MD  [facility]  Pager [phone] If 7PM-7AM, please contact night-coverage at [email or web address], password [facility] 10/29/2015, 7:43 AM  LOS: 2 days   Consultants:  Psychiatry  Procedures:  None   Antibiotics:  None  HPI/Subjective: Able to sleep and eat without difficulty. Denies any nausea, vomiting, or pain.   Objective: Filed Vitals:   10/28/15 0706  10/28/15 1511 10/28/15 2202 10/29/15 0605  BP: 136/69 110/58 106/54 111/59  Pulse: 97 82 81 84  Temp: 98 F (36.7 C) 98.3 F (36.8 C) 97.6 F (36.4 C) 98.8 F (37.1 C)  TempSrc: Oral Oral Oral Oral  Resp: 18 18 20 18   Height:      Weight:      SpO2: 97% 96% 98% 99%    Intake/Output Summary (Last 24 hours) at 10/29/15 0743 Last data filed at 10/29/15 0600  Gross per 24 hour  Intake    720 ml  Output      2 ml  Net    718 ml     Filed Weights   10/25/15 0310 10/25/15 2103  Weight: 72.122 kg (159 lb) 73.9 kg (162 lb 14.7 oz)    Exam:  Afebrile, VSS, not hypoxic  General:  Appears calm and comfortable Cardiovascular: RRR, no m/r/g. 1-2+ BLE edema. Respiratory: CTA bilaterally, no w/r/r. Normal respiratory effort. Psychiatric: grossly normal mood and affect, speech fluent and appropriate Neurologic: grossly non-focal  New data reviewed:  CBG notable for hypoglycemia early this morning.   BMP consistent with CKD Stage IV.   INR 1.56  Scheduled Meds: . atorvastatin  40 mg Oral QHS  . fenofibrate  160 mg Oral Daily  . furosemide  20 mg Oral Daily  . sodium chloride flush  3 mL Intravenous Q12H  . Warfarin - Pharmacist Dosing Inpatient   Does not apply Q24H   Continuous Infusions:    Principal Problem:   MDD (major depressive disorder), recurrent severe, without psychosis ([place]) Active Problems:   HTN (hypertension)   Diabetes ([place])   Atrial fibrillation ([place])   Pulmonary embolism ([place])  Suicidal overdose ([place])   Depression   Chronic kidney disease (CKD), stage IV (severe) ([place])   Dementia with behavioral disturbance   Hypoglycemia   Time spent: 20 minutes    By signing my name below, I, Rennis Harding attest that this documentation has been prepared under the direction and in the presence of Murray Hodgkins, MD Electronically signed: Rennis Harding  10/29/2015 10:12am   I personally performed the services described in this documentation. All  medical record entries made by the scribe were at my direction. I have reviewed the chart and agree that the record reflects my personal performance and is accurate and complete. Murray Hodgkins, MD

## 2015-10-30 ENCOUNTER — Telehealth: Payer: Self-pay | Admitting: Pediatrics

## 2015-10-30 DIAGNOSIS — F0391 Unspecified dementia with behavioral disturbance: Secondary | ICD-10-CM

## 2015-10-30 LAB — GLUCOSE, CAPILLARY
GLUCOSE-CAPILLARY: 117 mg/dL — AB (ref 65–99)
GLUCOSE-CAPILLARY: 131 mg/dL — AB (ref 65–99)
GLUCOSE-CAPILLARY: 161 mg/dL — AB (ref 65–99)
GLUCOSE-CAPILLARY: 169 mg/dL — AB (ref 65–99)
GLUCOSE-CAPILLARY: 185 mg/dL — AB (ref 65–99)
Glucose-Capillary: 118 mg/dL — ABNORMAL HIGH (ref 65–99)

## 2015-10-30 LAB — PROTIME-INR
INR: 1.62 — AB (ref 0.00–1.49)
PROTHROMBIN TIME: 19.3 s — AB (ref 11.6–15.2)

## 2015-10-30 MED ORDER — WARFARIN SODIUM 1 MG PO TABS
1.0000 mg | ORAL_TABLET | Freq: Once | ORAL | Status: AC
  Administered 2015-10-30: 1 mg via ORAL
  Filled 2015-10-30: qty 1

## 2015-10-30 NOTE — Progress Notes (Addendum)
PROGRESS NOTE  Julie Carpenter [id number] DOB: 08/08/1926 DOA: 10/24/2015 PCP: Eustaquio Maize, MD  Summary: 38 yof with PMH of CKD, DM, HTN, HLD, afib and lower GI bleed presented with complaints of intentional overdose on Elavil. It is reported that she told her daughter "I dont want to live like this anymore" and proceeded to ingest 16 Elavil 50mg  tablets. In the ED she was noted to be in afib with rate controlled, QTc of 455ms, Potassium 3.2, Creatinine 2.20. Tylenol level and toxic  Screen were both negative. Poison control center recommended 12 hours of monitoring. Hospitalist was asked to admit her for monitoring of TCA overdose and for psychiatric consultation  Assessment/Plan: 1. Suicidal overdose, intentional ingestion of 16 Elavil 50mg  tablets. No apparent sequela. 2. DM type 2. Stable. Hypoglycemia has resolved and she is now medically stable. 3. Atrial fibrillation, stable, continue warfarin 4. CKD stage IV, creatinine at baseline 5. PMH PE 08/2015   Hypoglycemia has resolved. She is medically stable for discharge to [facility] today.    Code Status: DNR DVT prophylaxis: Warfarin Family Communication: none present Disposition Plan: Inpatient psychiatry bed.   Murray Hodgkins, MD  [facility]  Pager [phone] If 7PM-7AM, please contact night-coverage at [email or web address], password [facility] 10/30/2015, 9:15 AM  LOS: 3 days   Consultants:  Psychiatry  Procedures:  None   Antibiotics:  None  HPI/Subjective: Overall doing okay but she does feel depressed. No further hypoglycemia.  Objective: Filed Vitals:   10/29/15 0605 10/29/15 1425 10/29/15 2227 10/30/15 0507  BP: 111/59 121/68 129/61 130/62  Pulse: 84 95 98 87  Temp: 98.8 F (37.1 C) 98.6 F (37 C) 98.3 F (36.8 C) 98.2 F (36.8 C)  TempSrc: Oral Oral Oral Oral  Resp: 18 18 18 18   Height:      Weight:      SpO2: 99% 96% 100% 100%    Intake/Output Summary (Last 24 hours) at 10/30/15  0915 Last data filed at 10/30/15 0507  Gross per 24 hour  Intake    480 ml  Output   1002 ml  Net   -522 ml     Filed Weights   10/25/15 0310 10/25/15 2103  Weight: 72.122 kg (159 lb) 73.9 kg (162 lb 14.7 oz)    Exam:  Afebrile, VSS, not hypoxic  General:  Appears comfortable, calm. Cardiovascular: Regular rate and irregular rhythm, no murmur, rub or gallop. Decreased 1+ lower extremity edema. Respiratory: Clear to auscultation bilaterally, no wheezes, rales or rhonchi. Normal respiratory effort. Psychiatric: appears depressed  New data reviewed:  CBG stable   Scheduled Meds: . atorvastatin  40 mg Oral QHS  . fenofibrate  160 mg Oral Daily  . sodium chloride flush  3 mL Intravenous Q12H  . Warfarin - Pharmacist Dosing Inpatient   Does not apply Q24H   Continuous Infusions:    Principal Problem:   MDD (major depressive disorder), recurrent severe, without psychosis ([place]) Active Problems:   HTN (hypertension)   Diabetes ([place])   Atrial fibrillation ([place])   Pulmonary embolism ([place])   Suicidal overdose ([place])   Depression   Chronic kidney disease (CKD), stage IV (severe) ([place])   Dementia with behavioral disturbance   Hypoglycemia       By signing my name below, I, Rennis Harding attest that this documentation has been prepared under the direction and in the presence of Murray Hodgkins, MD Electronically signed: Rennis Harding  10/30/2015 9:16am  I personally performed the services described in this  documentation. All medical record entries made by the scribe were at my direction. I have reviewed the chart and agree that the record reflects my personal performance and is accurate and complete. Murray Hodgkins, MD

## 2015-10-30 NOTE — Progress Notes (Signed)
Reviewed chart from weekend. Pt was tentatively accepted to [facility] pending receiving IVC paperwork and documentation that pt is medically stable.  Per notes, pt not medically stable over weekend.  Spoke with intake at [facility] Caryl Asp). States that "medical team will have to re-review pt's referral at this point, as she was not medically stable when there was a bed for her yesterday, per intake."  Advised "wait to hear from [place] today, I don't have an answer re: her acceptance right now."  Sharren Bridge, MSW, [place] Work, Disposition  10/30/2015 [phone]

## 2015-10-30 NOTE — BHH Counselor (Signed)
Received Call from [place], [place], [phone]) at [facility].  Tiffany asked about placement status for pt at either [facility] or preferable, [facility].  I advised that per Shift Report, per SW, [place] waiting on IVC paperwork & [facility] waiting on pt to become medically stable regarding renal disease and hypoglycemia.  Per note [facility] would hold referral for follow-up once stable.  Tiffany stated that medical issues had been addressed.  I called [facility] and talked to Butch Penny in Intake [phone]). She advised that there is no MD to review and medical info.until tomorrow morning.  Butch Penny suggested having AP MD/RN call [facility] in the morning.  I called Tiffany back and informed of conversation with Butch Penny in Intake and gave her the [place] number for Intake Dept.  Tiffany stated that she had since been informed that pt will be getting another assessment and may be discharged in the am.  Faylene Kurtz, MS, CRC, [place] Triage Specialist [facility]

## 2015-10-30 NOTE — Progress Notes (Signed)
Spoke with Garnet Sierras, [place] intake RN. She requested MAR, vitals, CBG, and IVC paperwork copies be faxed. States hope to be able to accept pt for admission pending receiving those documents.   CSW faxed all above excepting IVC paperwork- awaiting service of IVC by [facility]. When served, please fax copy to [facility] at [phone].  Number to reach [facility] intake- [phone].   Sharren Bridge, MSW, LCSW Clinical Social Work, Disposition  10/30/2015 [phone] Or, TTS office: [phone]

## 2015-10-30 NOTE — Progress Notes (Signed)
Faxed today's MD progress note to [facility]. Spoke with Robinette with intake, she states it will be included in documentation for medical team to review today. Also note that pt would have to be under IVC for acceptance to [facility]. Will follow up upon hearing back from [place].  Sharren Bridge, MSW, LCSW Clinical Social Work, Disposition  10/30/2015 [phone]

## 2015-10-30 NOTE — Progress Notes (Signed)
Per MD- patient is medically stable for d/c today and awaiting bed at [place] has worked with Jinny Blossom at [facility] Disposition today to facilitate d/c; MD completed IVC paperwork and d/c summary completed.  IVC paperwork faxed to Magistrate's office earlier today and awaiting Findings and Custody order for commitment.  Hopefully will be able to facilitate this afternoon or first thing in the morning.  Lorie Phenix. Pauline Good, [place] (coverage)

## 2015-10-30 NOTE — Progress Notes (Signed)
[place] for coumadin Indication: atrial fibrillation  Allergies  Allergen Reactions  . Penicillins Anaphylaxis    Has patient had a PCN reaction causing immediate rash, facial/tongue/throat swelling, SOB or lightheadedness with hypotension: Yes Has patient had a PCN reaction causing severe rash involving mucus membranes or skin necrosis: No Has patient had a PCN reaction that required hospitalization No Has patient had a PCN reaction occurring within the last 10 years: No If all of the above answers are "NO", then may proceed with Cephalosporin use.  Marland Kitchen Benicar [Olmesartan Medoxomil] Swelling    Lips swelling  . Ciprofloxacin Swelling  . Hctz [Hydrochlorothiazide] Swelling    Lips swelling  . Januvia [Sitagliptin Phosphate] Swelling    Lips swelling  . Norvasc [Amlodipine Besylate] Swelling    Lips swelling  . Ramipril Swelling    Lips swelling    Patient Measurements: Height: 5\' 4"  (162.6 cm) Weight: 162 lb 14.7 oz (73.9 kg) IBW/kg (Calculated) : 54.7  Vital Signs: Temp: 98.2 F (36.8 C) (02/20 0507) Temp Source: Oral (02/20 0507) BP: 130/62 mmHg (02/20 0507) Pulse Rate: 87 (02/20 0507)  Labs:  Recent Labs  10/28/15 0720 10/29/15 0630 10/30/15 0529  LABPROT 19.3* 18.7* 19.3*  INR 1.62* 1.56* 1.62*  CREATININE  --  2.61*  --     Estimated Creatinine Clearance: 15 mL/min (by C-G formula based on Cr of 2.61).   Medical History: Past Medical History  Diagnosis Date  . Hyperlipidemia   . Hypertension   . Diabetes mellitus without complication ([place])   . Atrial fibrillation (HCC)     Medications:  Prescriptions prior to admission  Medication Sig Dispense Refill Last Dose  . amitriptyline (ELAVIL) 50 MG tablet Take 50 mg by mouth at bedtime as needed for sleep.   10/24/2015 at Unknown time  . atorvastatin (LIPITOR) 40 MG tablet TAKE ONE (1) TABLET EACH DAY (Patient taking differently: Take 40 mg by mouth at bedtime. ) 90  tablet 1 10/23/2015 at Unknown time  . calcitRIOL (ROCALTROL) 0.25 MCG capsule Take 0.25 mcg by mouth 3 (three) times a week. Mon.,Wed.,Fri.  5 10/23/2015 at Unknown time  . carvedilol (COREG) 12.5 MG tablet Take 1 tablet (12.5 mg total) by mouth 2 (two) times daily with a meal. 60 tablet 0 10/24/2015 at 930A  . epoetin alfa (EPOGEN,PROCRIT) 2000 UNIT/ML injection Inject 2,000 Units into the vein every 21 ( twenty-one) days. Patient has procrit administered at [facility] short stay every 3 weeks   10/18/2015 at Unknown time  . fenofibrate (TRICOR) 145 MG tablet Take 1 tablet (145 mg total) by mouth daily. 90 tablet 1 10/23/2015 at Unknown time  . furosemide (LASIX) 20 MG tablet Take 1 tablet (20 mg total) by mouth daily. (Patient taking differently: Take 20-40 mg by mouth daily. ) 90 tablet 1 10/24/2015 at Unknown time  . glimepiride (AMARYL) 2 MG tablet Take 1.5 tablets (3 mg total) by mouth daily before breakfast. (Patient taking differently: Take 2 mg by mouth daily before breakfast. ) 135 tablet 1 10/24/2015 at Unknown time  . [DISCONTINUED] warfarin (COUMADIN) 2 MG tablet TAKE ONE (1) TABLET EACH DAY (Patient taking differently: TAKE ONE-HALF TABLET BY MOUTH ONCE DAILY IN THE EVENING) 30 tablet 2 10/23/2015 at 1800    Assessment: 82 yo lady to continue coumadin for afib. Admitted with elevated INR. Coumadin 1 mg given 2/17 after holding since 10/23/15.  Home regimen is 1mg  daily.  INR today 1.62   Goal of  Therapy:  INR 2-3 Monitor platelets by anticoagulation protocol: Yes   Plan:  Coumadin 1 mg po today  Daily PT/INR Monitor for bleeding complications Daily PT/INR  Isac Sarna, BS Vena Austria, BCPS Clinical Pharmacist Pager [phone] 10/30/2015,12:29 PM

## 2015-10-31 ENCOUNTER — Encounter (HOSPITAL_COMMUNITY): Payer: Self-pay | Admitting: Family Medicine

## 2015-10-31 DIAGNOSIS — I2699 Other pulmonary embolism without acute cor pulmonale: Secondary | ICD-10-CM

## 2015-10-31 LAB — GLUCOSE, CAPILLARY
GLUCOSE-CAPILLARY: 143 mg/dL — AB (ref 65–99)
GLUCOSE-CAPILLARY: 166 mg/dL — AB (ref 65–99)
Glucose-Capillary: 137 mg/dL — ABNORMAL HIGH (ref 65–99)
Glucose-Capillary: 139 mg/dL — ABNORMAL HIGH (ref 65–99)
Glucose-Capillary: 246 mg/dL — ABNORMAL HIGH (ref 65–99)
Glucose-Capillary: 246 mg/dL — ABNORMAL HIGH (ref 65–99)
Glucose-Capillary: 231 mg/dL — ABNORMAL HIGH (ref 65–99)

## 2015-10-31 LAB — PROTIME-INR
INR: 1.61 — ABNORMAL HIGH (ref 0.00–1.49)
Prothrombin Time: 19.1 seconds — ABNORMAL HIGH (ref 11.6–15.2)

## 2015-10-31 MED ORDER — WARFARIN SODIUM 2 MG PO TABS
2.0000 mg | ORAL_TABLET | Freq: Once | ORAL | Status: AC
  Administered 2015-10-31: 2 mg via ORAL
  Filled 2015-10-31: qty 1

## 2015-10-31 MED ORDER — CARVEDILOL 12.5 MG PO TABS
12.5000 mg | ORAL_TABLET | Freq: Two times a day (BID) | ORAL | Status: DC
  Administered 2015-11-01: 12.5 mg via ORAL
  Filled 2015-10-31: qty 1

## 2015-10-31 MED ORDER — ENOXAPARIN SODIUM 80 MG/0.8ML ~~LOC~~ SOLN
75.0000 mg | SUBCUTANEOUS | Status: DC
  Administered 2015-10-31: 75 mg via SUBCUTANEOUS
  Filled 2015-10-31: qty 0.8

## 2015-10-31 NOTE — Progress Notes (Signed)
IVC papers received by Probation officer.  Verlon Setting, [place] Disposition staff 10/31/2015 4:40 PM

## 2015-10-31 NOTE — Progress Notes (Signed)
Spoke to Anderson at [facility]. Agreed to re-review referral. CSW faxed all updated documentation.  Nunzio Cory called back to state pt is still declined due to medical needs. States that not having nephrology within house is not optimal for this pt.  Also called Mikel Cella to inquire about referring to that geriatric unit, however per Darlene there are no beds today and do not know when one may be available.  Pt has been declined at [facility] with Garnet Sierras, intake RN at [facility]. States they are still hoping to accept pt upon receiving IVC copies and d/c summary from MD dated today. CSW requested IVC copies be faxed to [facility] at [phone] attn Aniesha Haughn- will send to [facility] upon receipt. Will send physician note when available.   Sharren Bridge, MSW, LCSW Clinical Social Work, Disposition  10/31/2015 [phone]

## 2015-10-31 NOTE — Progress Notes (Signed)
Telepsych Consultation Progress Note  Reason for Consult: Suicide attempt overdose Referring Physician: [facility] attending Patient Identification: Julie Carpenter MRN: [id number] Principal Diagnosis: MDD (major depressive disorder), recurrent severe, without psychosis ([place]) with suicide attempt overdose  On Re-Evaluation:  Julie Carpenter is awake, alert and oriented X3, seen resting in bed with  safety sitter and daughter at bedside. Patient provided permission to speak in front of family and staff.  Follow-up tele assessment: Patient is still endorsing feeling of " sadness/emptiness"   Reports the these feeling are not as bad as they were 3 days ago.  Patient reports depression/sadness for the past 3 months and is not sure why she is depressed. Reports that she lives alone. States that she plays [employer]ING[employer] sometime.  Patient denies any past or present treatment for mental health/ilness. Reports she took two too many pills to feel "happy inside." Reports her depression 3/10 today. Denies homicidal, auditory or visual hallucination. Patient  and does not appear to be responding to internal stimuli.  Support, encouragement and reassurance was provided.   Patients daughter has concerns regarding patient placement, daughter reports that she is able to care for her mother at her home.    Per Subjective Assessment 10/26/2015:  Julie Carpenter is a 82 y.o. female patient admitted with reports of suicide attempt with overdose of Elavil on 10/24/15. Pt is alert, oriented to self and situation, yet intermittently to time and place due to moderate dementia. Pt denies homicidal ideation and psychosis and does not appear to be responding to internal stimuli. Minimizing suicidal thoughts although she dose affirm suicidal intent when she overdosed.   Collateral from both daughters is that pt has been severely depressed for a year, worsening significantly over the past 3 months. The pt lives with one of her daughters who  helps her with her medication. Both daughters (on Speakerphone) voiced their concerns that they cannot keep her safe nor do they have time to watch her and are working on ALF paperwork now. See plan below.   HPI: I have reviewed and concur with the following HPI elements from [facility] TTS assessment, modified as follows.  Julie Carpenter is an 82 y.o. female that presents this date with thoughts of self harm and overdosed on Elavil yesterday 10/24/15. Patient has been residing with her daughter Weldon Inches [phone] for the last few weeks due to patient not being able to reside alone. Collateral information gathered from the daughter on admission from daughter stated patient has been receiving Elavil that had been prescribed by Ronnald Collum NP at [facility] . Daughter was unsure when the medication was prescribed but had D/Ced that medication three weeks ago from the advice of her family practitioner and Hassell Done NP to administer only on a PRN bases due to patient's other health issues. Daughter stated that her mother was "all to pieces" yesterday and asked for one of her "nerve pills." Daughter stated that when she gave her mother one of the pills and took the bottle of medication into another room, mother supposedly went in to that room and took 16 more pills. Daughter stated she returned to find patient lying on her bed unresponsive. Patient was transported to [facility] as poison control center was contacted and monitored. Patient stated during assessment, "I don't want to live like this anymore" and just wanted it "to be over." Daughter reports patient has been decompensating the last few weeks with noticeable memory loss. Patient was not time,  place oriented on intake. Patient was open for voluntary admission and admitted to needling help. Patient denies any AVH or prior thoughts of S/I before yesterday. Case was staffed with Markus Jarvis NP who agreed patient meet criteria for  inpatient gerontology admission as appropriate bed placement is explored. Disposition was rendered to [employer] RN at [facility] as patient awaits placement     Principal Problem: MDD (major depressive disorder), recurrent severe, without psychosis ([place]) Diagnosis:   Patient Active Problem List   Diagnosis Date Noted  . Hypoglycemia [E16.2] 10/27/2015  . MDD (major depressive disorder), recurrent severe, without psychosis ([place]) [F33.2] 10/26/2015  . Dementia with behavioral disturbance [F03.91] 10/26/2015  . Chronic kidney disease (CKD), stage IV (severe) ([place]) [N18.4]   . Suicidal overdose ([place]) [T50.902A] 10/24/2015  . Depression [F32.9] 10/24/2015  . Other acute pulmonary embolism without acute cor pulmonale ([place]) [I26.99] 08/31/2015  . Long term (current) use of anticoagulants [Z79.01] [Z79.01] 08/31/2015  . Acute renal failure superimposed on stage 4 chronic kidney disease ([place]) [N17.9, N18.4] 08/28/2015  . Pulmonary embolism ([place]) [I26.99] 08/23/2015  . Shortness of breath [R06.02] 08/22/2015  . AKI (acute kidney injury) ([place]) [N17.9]   . Dyspnea [R06.00] 08/17/2015  . DOE (dyspnea on exertion) [R06.09] 08/17/2015  . GERD (gastroesophageal reflux disease) [K21.9] 02/23/2014  . Peripheral edema [R60.9] 02/23/2014  . GI bleed [K92.2] 04/05/2013  . Atrial fibrillation ([place]) [I48.91] 02/24/2013  . HTN (hypertension) [I10] 12/26/2010  . Chronic kidney disease (CKD) stage G3b/A2, moderately decreased glomerular filtration rate (GFR) between 30-44 mL/min/1.73 square meter and albuminuria creatinine ratio between 30-299 mg/g [N18.3] 12/26/2010  . Hyperlipemia [E78.5] 12/26/2010  . Peripheral neuropathy ([place]) [G62.9] 12/26/2010  . Anemia [D64.9] 12/26/2010  . Diabetes ([place]) [E11.9] 12/26/2010  . Vertebral fracture [IMO0002] 12/26/2010  . Venous insufficiency [I87.2] 12/26/2010  . Osteoporosis [M81.0] 12/26/2010  . Thyromegaly [E04.9] 12/26/2010   Total Time spent with patient: 20  minutes  Past Psychiatric History: See Above  Past Medical History:  Past Medical History  Diagnosis Date  . Hyperlipidemia   . Hypertension   . Diabetes mellitus without complication ([place])   . Atrial fibrillation [facility])     Past Surgical History  Procedure Laterality Date  . Abdominal hysterectomy    . Cholecystectomy    . Bladder tack    . Esophagogastroduodenoscopy N/A 04/06/2013    Procedure: ESOPHAGOGASTRODUODENOSCOPY (EGD);  Surgeon: Danie Binder, MD;  Location: [facility] ENDO SUITE;  Service: Endoscopy;  Laterality: N/A;  8:45  . Givens capsule study  04/06/2013    Procedure: GIVENS CAPSULE STUDY;  Surgeon: Danie Binder, MD;  Location: [facility] ENDO SUITE;  Service: Endoscopy;;  . Colonoscopy N/A 05/03/2013    [id number] MOST LIKELY DUE TO ONE AVM in the ascending colon/Moderate diverticulosis hroughout the entire examined colon/Two POLYPS REMOVED/Small internal hemorrhoids   Family History:  Family History  Problem Relation Age of Onset  . Colon cancer Neg Hx    Family Psychiatric  History:Unknown Social History:  History  Alcohol Use No     History  Drug Use No    Social History   Social History  . Marital Status: Widowed    Spouse Name: N/A  . Number of Children: N/A  . Years of Education: N/A   Social History Main Topics  . Smoking status: Never Smoker   . Smokeless tobacco: None  . Alcohol Use: No  . Drug Use: No  . Sexual Activity: Not Asked   Other Topics Concern  . None  Social History Narrative   Additional Social History:    Pain Medications: See MAR Prescriptions: See MAR Over the Counter: See MAR History of alcohol / drug use?: No history of alcohol / drug abuse                      Current Medications: Current Facility-Administered Medications  Medication Dose Route Frequency Provider Last Rate Last Dose  . atorvastatin (LIPITOR) tablet 40 mg  40 mg Oral QHS Orvan Falconer, MD   40 mg at 10/30/15 2123  . fenofibrate tablet 160 mg  160  mg Oral Daily Orvan Falconer, MD   160 mg at 10/31/15 0824  . sodium chloride flush (NS) 0.9 % injection 3 mL  3 mL Intravenous Q12H Orvan Falconer, MD   3 mL at 10/31/15 0825  . warfarin (COUMADIN) tablet 2 mg  2 mg Oral Once Samuella Cota, MD      . Warfarin - Pharmacist Dosing Inpatient   Does not apply Q24H Samuella Cota, MD        Lab Results:  Results for orders placed or performed during the hospital encounter of 10/24/15 (from the past 48 hour(s))  Glucose, capillary     Status: Abnormal   Collection Time: 10/29/15  5:56 PM  Result Value Ref Range   Glucose-Capillary 176 (H) 65 - 99 mg/dL   Comment 1 Notify RN   Glucose, capillary     Status: Abnormal   Collection Time: 10/29/15  8:07 PM  Result Value Ref Range   Glucose-Capillary 173 (H) 65 - 99 mg/dL   Comment 1 Notify RN    Comment 2 Document in Chart   Glucose, capillary     Status: Abnormal   Collection Time: 10/30/15 12:15 AM  Result Value Ref Range   Glucose-Capillary 131 (H) 65 - 99 mg/dL   Comment 1 Notify RN    Comment 2 Document in Chart   Glucose, capillary     Status: Abnormal   Collection Time: 10/30/15  5:06 AM  Result Value Ref Range   Glucose-Capillary 118 (H) 65 - 99 mg/dL   Comment 1 Notify RN    Comment 2 Document in Chart   Protime-INR     Status: Abnormal   Collection Time: 10/30/15  5:29 AM  Result Value Ref Range   Prothrombin Time 19.3 (H) 11.6 - 15.2 seconds   INR 1.62 (H) 0.00 - 1.49  Glucose, capillary     Status: Abnormal   Collection Time: 10/30/15  7:39 AM  Result Value Ref Range   Glucose-Capillary 117 (H) 65 - 99 mg/dL  Glucose, capillary     Status: Abnormal   Collection Time: 10/30/15 12:04 PM  Result Value Ref Range   Glucose-Capillary 161 (H) 65 - 99 mg/dL  Glucose, capillary     Status: Abnormal   Collection Time: 10/30/15  5:16 PM  Result Value Ref Range   Glucose-Capillary 185 (H) 65 - 99 mg/dL  Glucose, capillary     Status: Abnormal   Collection Time: 10/30/15 10:16 PM   Result Value Ref Range   Glucose-Capillary 169 (H) 65 - 99 mg/dL   Comment 1 Notify RN    Comment 2 Document in Chart   Glucose, capillary     Status: Abnormal   Collection Time: 10/31/15 12:40 AM  Result Value Ref Range   Glucose-Capillary 166 (H) 65 - 99 mg/dL   Comment 1 Notify RN    Comment 2 Document  in Chart   Protime-INR     Status: Abnormal   Collection Time: 10/31/15  5:22 AM  Result Value Ref Range   Prothrombin Time 19.1 (H) 11.6 - 15.2 seconds   INR 1.61 (H) 0.00 - 1.49  Glucose, capillary     Status: Abnormal   Collection Time: 10/31/15  5:26 AM  Result Value Ref Range   Glucose-Capillary 137 (H) 65 - 99 mg/dL  Glucose, capillary     Status: Abnormal   Collection Time: 10/31/15  5:27 AM  Result Value Ref Range   Glucose-Capillary 143 (H) 65 - 99 mg/dL   Comment 1 Notify RN    Comment 2 Document in Chart   Glucose, capillary     Status: Abnormal   Collection Time: 10/31/15  7:13 AM  Result Value Ref Range   Glucose-Capillary 139 (H) 65 - 99 mg/dL   Comment 1 Notify RN    Comment 2 Document in Chart   Glucose, capillary     Status: Abnormal   Collection Time: 10/31/15 11:30 AM  Result Value Ref Range   Glucose-Capillary 246 (H) 65 - 99 mg/dL   Comment 1 Notify RN    Comment 2 Document in Chart     Blood Alcohol level:  No results found for: [facility]  Physical Findings: AIMS:  , ,  ,  ,    CIWA:    COWS:     Musculoskeletal: Strength & Muscle Tone: within normal limits Gait & Station: patient resting in bed Patient leans: N/A  Psychiatric Specialty Exam: Review of Systems  Psychiatric/Behavioral: Positive for depression and suicidal ideas. Negative for hallucinations. The patient is nervous/anxious.   All other systems reviewed and are negative.   Blood pressure 130/78, pulse 84, temperature 98.4 F (36.9 C), temperature source Oral, resp. rate 18, height 5\' 4"  (1.626 m), weight 73.9 kg (162 lb 14.7 oz), SpO2 98 %.Body mass index is 27.95 kg/(m^2).   General Appearance: Casual  Eye Contact::  Fair  Speech:  Clear and Coherent  Volume:  Normal with fluctuation  Mood:  Depressed 3/10. Patient reports depression/sadness for the past 3 months and is not sure why she is depressed.   Affect:  Depressed and Flat  Thought Process:  Ruminative-minimizing symptoms of worries.    Orientation:  Full (Time, Place, and Person)  Thought Content:  NA  Suicidal Thoughts:  No Patient denies suicidal ideation at this time  Homicidal Thoughts:  No  Memory:  Immediate;   Fair Recent;   Fair Remote;   Fair  Judgement:  Intact  Insight:  Lacking  Psychomotor Activity:  Restlessness  Concentration:  Fair  Recall:  [employer] of Knowledge:Fair  Language: Fair  Akathisia:  No  Handed:  Right  AIMS (if indicated):     Assets:  Desire for Improvement Resilience  ADL's:  Intact  Cognition: WNL  Sleep:        Patient seen tele-assessment  for psychiatric evaluation follow-up, chart reviewed and case discussed with the MD Dwyane Dee, Advanced Practice Provider, CSW and Treatment team. Reviewed the information documented and agree with current Dispoistion.  Disposition:  -Inpatient psychiatric treatment cont'd to be recommended.  -Per [facility] CSW, IVC has not been served. CSW re-faxed IVC petition to [facility]. Awaiting service so that copies can be faxed to [facility]. Pamala Hurry, intake RN at [facility], also requesting MD note from today noting pt stable to d/c, will fax when available. -Seek Geriatric Psychiatry inpatient -Family to consult with SW  regarding ALF placement upon discharge from inpatient psychiatry (filling out papers today)  Derrill [place], NP 10/31/2015, 4:32 PM

## 2015-10-31 NOTE — Progress Notes (Addendum)
Writer awaiting IVC papers from [facility]-DEPT [facility].  Writer received call from Dr. Sarajane Jews with regards to bed status at [facility].  Writer informed that [facility] has been waiting for [place] to fax them IVC papers, discharge note from MD, and teleassessment note from NP. CSW will continue to follow up.  Verlon Setting, [place] Disposition staff 10/31/2015 4:00 PM

## 2015-10-31 NOTE — Progress Notes (Signed)
Pt reassessed via telepsychiatry by T. Bobby Rumpf, NP. Inpatient psychiatric treatment cont'd to be recommended.   Per [facility] CSW, IVC has not been served. CSW re-faxed IVC petition to [facility]. Awaiting service so that copies can be faxed to [facility]. Pamala Hurry, intake RN at [facility], also requesting MD note from today noting pt stable to d/c, will fax when available.   Sharren Bridge, MSW, LCSW Clinical Social Work, Disposition  10/31/2015 [phone]

## 2015-10-31 NOTE — Progress Notes (Signed)
PROGRESS NOTE  LATRICIA VALLANCE [id number] DOB: 1926-05-16 DOA: 10/24/2015 PCP: Eustaquio Maize, MD  Summary: 27 yof with PMH of CKD, DM, HTN, HLD, afib and lower GI bleed presented with complaints of intentional overdose on Elavil. It is reported that she told her daughter "I dont want to live like this anymore" and proceeded to ingest 16 Elavil 50mg  tablets. In the ED she was noted to be in afib with rate controlled, QTc of 492ms, Potassium 3.2, Creatinine 2.20. Tylenol level and toxic  Screen were both negative. Poison control center recommended 12 hours of monitoring. Hospitalist was asked to admit her for monitoring of TCA overdose and for psychiatric consultation  Assessment/Plan: 1. Suicidal overdose, intentional ingestion of 16 Elavil 50mg  tablets. No apparent sequela. Psychiatry continues to recommend inpatient psychiatric placement. 2. DM type 2. Blood sugars continue to remain stable. No hypoglycemia greater than 48 hours 3. Atrial fibrillation, remains stable. Warfarin per pharmacy. 4. CKD stage IV, creatinine close to baseline. 5. PMH PE 08/2015   No apparent sequela status post overdose. Case discussed with psychiatry, they continue to recommend inpatient psychiatric treatment for depression and suicide attempt. IVC recommended. [facility] considering accepting patient.  Long discussion with family at bedside, 2 daughters, son-in-law.  Hypoglycemia has resolved, there is no evidence of infection.  Will check BMP, INR in AM. INR subtherapeutic will cover with Lovenox.  If labs stable in AM, will be medically clear.  Code Status: DNR DVT prophylaxis: Warfarin Family Communication: Daughters and Son-in-law bedside Disposition Plan: Inpatient psychiatry bed.   Murray Hodgkins, MD  [facility]  Pager [phone] If 7PM-7AM, please contact night-coverage at [email or web address], password [facility] 10/31/2015, 9:55 AM  LOS: 4 days    Consultants:  Psychiatry  Procedures:  None   Antibiotics:  None  HPI/Subjective: Has no complaints of nausea, vomiting, or pain. No suicidal ideation.  Objective: Filed Vitals:   10/29/15 2227 10/30/15 0507 10/30/15 2217 10/31/15 0529  BP: 129/61 130/62 115/66 142/93  Pulse: 98 87 77 104  Temp: 98.3 F (36.8 C) 98.2 F (36.8 C) 98.5 F (36.9 C) 97.6 F (36.4 C)  TempSrc: Oral Oral Oral Oral  Resp: 18 18 18 20   Height:      Weight:      SpO2: 100% 100% 100% 96%    Intake/Output Summary (Last 24 hours) at 10/31/15 0955 Last data filed at 10/30/15 2217  Gross per 24 hour  Intake    480 ml  Output    450 ml  Net     30 ml     Filed Weights   10/25/15 0310 10/25/15 2103  Weight: 72.122 kg (159 lb) 73.9 kg (162 lb 14.7 oz)    Exam:  Afebrile, VSS, not hypoxic  General:  Appears calm and comfortable. Lying in bed. Cardiovascular: Irregular otherwise normal, no m/r/g. 1+ BLE edema. Upper arm edema has markedly improved. Respiratory: CTA bilaterally, no w/r/r. Normal respiratory effort. Psychiatric: grossly normal mood and affect, speech fluent and appropriate  New data reviewed:  CBG stable   Scheduled Meds: . atorvastatin  40 mg Oral QHS  . fenofibrate  160 mg Oral Daily  . sodium chloride flush  3 mL Intravenous Q12H  . warfarin  2 mg Oral Once  . Warfarin - Pharmacist Dosing Inpatient   Does not apply Q24H   Continuous Infusions:    Principal Problem:   MDD (major depressive disorder), recurrent severe, without psychosis ([place]) Active Problems:   HTN (hypertension)  Diabetes ([place])   Atrial fibrillation ([place])   Pulmonary embolism ([place])   Suicidal overdose ([place])   Depression   Chronic kidney disease (CKD), stage IV (severe) ([place])   Dementia with behavioral disturbance   Hypoglycemia   By signing my name below, I, Rennis Harding attest that this documentation has been prepared under the direction and in the presence of Murray Hodgkins,  MD Electronically signed: Rennis Harding  10/31/2015 11:45am   I personally performed the services described in this documentation. All medical record entries made by the scribe were at my direction. I have reviewed the chart and agree that the record reflects my personal performance and is accurate and complete. Murray Hodgkins, MD

## 2015-10-31 NOTE — Progress Notes (Addendum)
Patient's IVC papers, MD note for d/c, and teleassessment note have been faxed to [facility]. Ann at [facility] confirmed that papers were received and RN will be reviewing them.   Verlon Setting, [place] Disposition staff 10/31/2015 5:21 PM

## 2015-10-31 NOTE — Telephone Encounter (Signed)
Returned Patient's daughter Ivin Booty) phone call. Daughter states that patient was taken to the hospital for taking to many pills and now the doctor's want to put patient in a "mental institution".  Patient's daughter is wanting to speak with Dr. Evette Doffing or [place].  Informed patient that Dr. Evette Doffing is out of the office today 10/31/2015 and will not be back until tomorrow 11/01/2015.  Informed Mary-Margaret Hassell Done of situation.  Per Chevis Pretty,  She was not seen in our office for this situation and needs to do what the doctor's at the hospital are recommending. Patient's daughter verbalized understanding.

## 2015-10-31 NOTE — Progress Notes (Addendum)
[place] for coumadin Indication: atrial fibrillation  Allergies  Allergen Reactions  . Penicillins Anaphylaxis    Has patient had a PCN reaction causing immediate rash, facial/tongue/throat swelling, SOB or lightheadedness with hypotension: Yes Has patient had a PCN reaction causing severe rash involving mucus membranes or skin necrosis: No Has patient had a PCN reaction that required hospitalization No Has patient had a PCN reaction occurring within the last 10 years: No If all of the above answers are "NO", then may proceed with Cephalosporin use.  Marland Kitchen Benicar [Olmesartan Medoxomil] Swelling    Lips swelling  . Ciprofloxacin Swelling  . Hctz [Hydrochlorothiazide] Swelling    Lips swelling  . Januvia [Sitagliptin Phosphate] Swelling    Lips swelling  . Norvasc [Amlodipine Besylate] Swelling    Lips swelling  . Ramipril Swelling    Lips swelling    Patient Measurements: Height: 5\' 4"  (162.6 cm) Weight: 162 lb 14.7 oz (73.9 kg) IBW/kg (Calculated) : 54.7  Vital Signs: Temp: 97.6 F (36.4 C) (02/21 0529) Temp Source: Oral (02/21 0529) BP: 142/93 mmHg (02/21 0529) Pulse Rate: 104 (02/21 0529)  Labs:  Recent Labs  10/29/15 0630 10/30/15 0529 10/31/15 0522  LABPROT 18.7* 19.3* 19.1*  INR 1.56* 1.62* 1.61*  CREATININE 2.61*  --   --     Estimated Creatinine Clearance: 15 mL/min (by C-G formula based on Cr of 2.61).   Medical History: Past Medical History  Diagnosis Date  . Hyperlipidemia   . Hypertension   . Diabetes mellitus without complication ([place])   . Atrial fibrillation (HCC)     Medications:  Prescriptions prior to admission  Medication Sig Dispense Refill Last Dose  . amitriptyline (ELAVIL) 50 MG tablet Take 50 mg by mouth at bedtime as needed for sleep.   10/24/2015 at Unknown time  . atorvastatin (LIPITOR) 40 MG tablet TAKE ONE (1) TABLET EACH DAY (Patient taking differently: Take 40 mg by mouth at bedtime. ) 90  tablet 1 10/23/2015 at Unknown time  . calcitRIOL (ROCALTROL) 0.25 MCG capsule Take 0.25 mcg by mouth 3 (three) times a week. Mon.,Wed.,Fri.  5 10/23/2015 at Unknown time  . carvedilol (COREG) 12.5 MG tablet Take 1 tablet (12.5 mg total) by mouth 2 (two) times daily with a meal. 60 tablet 0 10/24/2015 at 930A  . epoetin alfa (EPOGEN,PROCRIT) 2000 UNIT/ML injection Inject 2,000 Units into the vein every 21 ( twenty-one) days. Patient has procrit administered at [facility] short stay every 3 weeks   10/18/2015 at Unknown time  . fenofibrate (TRICOR) 145 MG tablet Take 1 tablet (145 mg total) by mouth daily. 90 tablet 1 10/23/2015 at Unknown time  . furosemide (LASIX) 20 MG tablet Take 1 tablet (20 mg total) by mouth daily. (Patient taking differently: Take 20-40 mg by mouth daily. ) 90 tablet 1 10/24/2015 at Unknown time  . glimepiride (AMARYL) 2 MG tablet Take 1.5 tablets (3 mg total) by mouth daily before breakfast. (Patient taking differently: Take 2 mg by mouth daily before breakfast. ) 135 tablet 1 10/24/2015 at Unknown time  . [DISCONTINUED] warfarin (COUMADIN) 2 MG tablet TAKE ONE (1) TABLET EACH DAY (Patient taking differently: TAKE ONE-HALF TABLET BY MOUTH ONCE DAILY IN THE EVENING) 30 tablet 2 10/23/2015 at 1800    Assessment: 82 yo lady to continue coumadin for afib. Admitted with elevated INR and INR today is subtherapeutic due to holding coumadin.  No bleeding noted  Goal of Therapy:  INR 2-3 Monitor platelets by  anticoagulation protocol: Yes   Plan:  Coumadin 2 mg po today Monitor for bleeding complications Daily PT/INR  Thanks for allowing pharmacy to be a part of this patient's care.  Excell Seltzer, PharmD Clinical Pharmacist  10/31/2015,8:16 AM  Addum:  Hh/o PE.  MD wishes to bridge with lovenox while INR is subtherapeutic.  Will give 1mg /kg (75 mg) sq q24 hours

## 2015-11-01 DIAGNOSIS — T50902D Poisoning by unspecified drugs, medicaments and biological substances, intentional self-harm, subsequent encounter: Secondary | ICD-10-CM

## 2015-11-01 LAB — GLUCOSE, CAPILLARY
GLUCOSE-CAPILLARY: 165 mg/dL — AB (ref 65–99)
GLUCOSE-CAPILLARY: 182 mg/dL — AB (ref 65–99)
Glucose-Capillary: 146 mg/dL — ABNORMAL HIGH (ref 65–99)
Glucose-Capillary: 135 mg/dL — ABNORMAL HIGH (ref 65–99)

## 2015-11-01 LAB — BASIC METABOLIC PANEL
Anion gap: 6 (ref 5–15)
BUN: 36 mg/dL — AB (ref 6–20)
CALCIUM: 8.8 mg/dL — AB (ref 8.9–10.3)
CO2: 21 mmol/L — ABNORMAL LOW (ref 22–32)
CREATININE: 1.68 mg/dL — AB (ref 0.44–1.00)
Chloride: 110 mmol/L (ref 101–111)
GFR calc Af Amer: 30 mL/min — ABNORMAL LOW (ref >60)
GFR, EST NON AFRICAN AMERICAN: 26 mL/min — AB (ref >60)
GLUCOSE: 145 mg/dL — AB (ref 65–99)
POTASSIUM: 4 mmol/L (ref 3.5–5.1)
SODIUM: 137 mmol/L (ref 135–145)

## 2015-11-01 LAB — PROTIME-INR
INR: 1.95 — AB (ref 0.00–1.49)
PROTHROMBIN TIME: 22.1 s — AB (ref 11.6–15.2)

## 2015-11-01 MED ORDER — WARFARIN SODIUM 2 MG PO TABS: 2.0000 mg | ORAL_TABLET | Freq: Once | ORAL | Status: DC

## 2015-11-01 NOTE — Care Management Important Message (Signed)
Important Message  Patient Details  Name: Julie Carpenter MRN: [id number] Date of Birth: 10-Nov-1925   Medicare Important Message Given:  Yes    Sherald Barge, RN 11/01/2015, 9:59 AM

## 2015-11-01 NOTE — Progress Notes (Signed)
Marlowe Kays, intake RN at [facility], states pt will have bed today pending receiving "Discharge Summary" dated today. When available will fax, Marlowe Kays states they will follow up accepting information once received.   Sharren Bridge, MSW, LCSW Clinical Social Work, Disposition  11/01/2015 [phone]

## 2015-11-01 NOTE — Progress Notes (Signed)
Pt transferred to: [facility] Anticipated date of transfer: 11/01/15 Transported by: [facility] Lucianne Lei) Time Tentatively Scheduled for: 2:00 PM Family notified:Daughter Ivin Booty and patient's 2 son in laws Report # given to nursing to call report to facility. DC papers completed for transport. Family, while still tearful and frustrated, are more resigned to commitment process.  Re- discussed commitment process and family provided with name, phone number and items patient would need at facility.  They were notified that the Social worker at [facility] would be in contact with them re: patient's release when stable.  No further CSW needs identified.  CSW signing off.  Kendell Bane, LCSW [phone]

## 2015-11-01 NOTE — Progress Notes (Signed)
Pt's IV catheter removed and intact. Pt's IV site clean dry and intact. Report given to Ms. Venia Minks at [facility]. Pt escorted by [facility].

## 2015-11-01 NOTE — Progress Notes (Signed)
CSW met with patient's 2 daughters and her son to discuss dc plan to [facility] when medically stable.  Family is extremely upset about this plan and do not feel that placement is justified for patient. They agree that she could benefit from psychiatric treatment but do not want her to be moved to [facility].  CSW discussed in detail the placement process for involuntary commitment and attempted throughout conversation to give reassurance and support to family. Daughters noted to be very tearful and at times angry and frustrated.  Awaiting for patient to be served by [facility] Department and then for [facility] to call re: bed offer.  Lorie Phenix. [place], [place] (coverage)

## 2015-11-01 NOTE — Progress Notes (Signed)
Spoke with intake at [facility] ([place]). States IVC copies and MD progress note were received yesterday, however "The admitting MD will be looking at it this morning, I am not sure the progress note is sufficient. The MD will make the call this morning and, if they are okay with it, she can come today." States that MD will be reviewing after 8am this morning. [place] states they will call writer if further information is needed.  Sharren Bridge, MSW, LCSW Clinical Social Work, Disposition  11/01/2015 [phone]

## 2015-11-01 NOTE — Progress Notes (Signed)
[place] for coumadin Indication: atrial fibrillation  Allergies  Allergen Reactions  . Penicillins Anaphylaxis    Has patient had a PCN reaction causing immediate rash, facial/tongue/throat swelling, SOB or lightheadedness with hypotension: Yes Has patient had a PCN reaction causing severe rash involving mucus membranes or skin necrosis: No Has patient had a PCN reaction that required hospitalization No Has patient had a PCN reaction occurring within the last 10 years: No If all of the above answers are "NO", then may proceed with Cephalosporin use.  Marland Kitchen Benicar [Olmesartan Medoxomil] Swelling    Lips swelling  . Ciprofloxacin Swelling  . Hctz [Hydrochlorothiazide] Swelling    Lips swelling  . Januvia [Sitagliptin Phosphate] Swelling    Lips swelling  . Norvasc [Amlodipine Besylate] Swelling    Lips swelling  . Ramipril Swelling    Lips swelling   Patient Measurements: Height: 5\' 4"  (162.6 cm) Weight: 162 lb 14.7 oz (73.9 kg) IBW/kg (Calculated) : 54.7  Vital Signs: Temp: 98.3 F (36.8 C) (02/22 0433) Temp Source: Oral (02/22 0433) BP: 137/88 mmHg (02/22 0743) Pulse Rate: 113 (02/22 0743)  Labs:  Recent Labs  10/30/15 0529 10/31/15 0522 11/01/15 0621  LABPROT 19.3* 19.1* 22.1*  INR 1.62* 1.61* 1.95*  CREATININE  --   --  1.68*    Estimated Creatinine Clearance: 23.2 mL/min (by C-G formula based on Cr of 1.68).   Medical History: Past Medical History  Diagnosis Date  . Hyperlipidemia   . Hypertension   . Diabetes mellitus without complication ([place])   . Atrial fibrillation ([place])   . PE (pulmonary embolism)   . DVT (deep venous thrombosis) (HCC)     Medications:  Prescriptions prior to admission  Medication Sig Dispense Refill Last Dose  . amitriptyline (ELAVIL) 50 MG tablet Take 50 mg by mouth at bedtime as needed for sleep.   10/24/2015 at Unknown time  . atorvastatin (LIPITOR) 40 MG tablet TAKE ONE (1) TABLET EACH  DAY (Patient taking differently: Take 40 mg by mouth at bedtime. ) 90 tablet 1 10/23/2015 at Unknown time  . calcitRIOL (ROCALTROL) 0.25 MCG capsule Take 0.25 mcg by mouth 3 (three) times a week. Mon.,Wed.,Fri.  5 10/23/2015 at Unknown time  . carvedilol (COREG) 12.5 MG tablet Take 1 tablet (12.5 mg total) by mouth 2 (two) times daily with a meal. 60 tablet 0 10/24/2015 at 930A  . epoetin alfa (EPOGEN,PROCRIT) 2000 UNIT/ML injection Inject 2,000 Units into the vein every 21 ( twenty-one) days. Patient has procrit administered at [facility] short stay every 3 weeks   10/18/2015 at Unknown time  . fenofibrate (TRICOR) 145 MG tablet Take 1 tablet (145 mg total) by mouth daily. 90 tablet 1 10/23/2015 at Unknown time  . furosemide (LASIX) 20 MG tablet Take 1 tablet (20 mg total) by mouth daily. (Patient taking differently: Take 20-40 mg by mouth daily. ) 90 tablet 1 10/24/2015 at Unknown time  . glimepiride (AMARYL) 2 MG tablet Take 1.5 tablets (3 mg total) by mouth daily before breakfast. (Patient taking differently: Take 2 mg by mouth daily before breakfast. ) 135 tablet 1 10/24/2015 at Unknown time  . [DISCONTINUED] warfarin (COUMADIN) 2 MG tablet TAKE ONE (1) TABLET EACH DAY (Patient taking differently: TAKE ONE-HALF TABLET BY MOUTH ONCE DAILY IN THE EVENING) 30 tablet 2 10/23/2015 at 1800   Assessment: 82 yo lady to continue coumadin for afib. No bleeding noted.  INR trending up.    Goal of Therapy:  INR  2-3 Monitor platelets by anticoagulation protocol: Yes   Plan:  Repeat Coumadin 2 mg po today Monitor for bleeding complications Daily PT/INR  Thanks for allowing pharmacy to be a part of this patient's care.  Hart Robinsons, PharmD Clinical Pharmacist  11/01/2015,12:08 PM

## 2015-11-01 NOTE — Discharge Summary (Signed)
Physician Discharge Summary  Julie Carpenter [id number] DOB: 22-Jan-1926 DOA: 10/24/2015  PCP: Eustaquio Maize, MD  Admit date: 10/24/2015 Discharge date: 11/01/2015  Time spent: 45 minutes  Recommendations for Outpatient Follow-up:  -Will be discharged today to [facility] for continued psychiatric care following Elavil overdose. -We'll need continued INR monitoring and Coumadin dose adjustment.   Discharge Diagnoses:  Principal Problem:   MDD (major depressive disorder), recurrent severe, without psychosis ([place]) Active Problems:   HTN (hypertension)   Diabetes ([place])   Atrial fibrillation ([place])   Pulmonary embolism (HCC)   Suicidal overdose ([place])   Depression   Chronic kidney disease (CKD), stage IV (severe) ([place])   Dementia with behavioral disturbance   Hypoglycemia   Discharge Condition: Stable and improved  Filed Weights   10/25/15 0310 10/25/15 2103  Weight: 72.122 kg (159 lb) 73.9 kg (162 lb 14.7 oz)    History of present illness:  As per Dr. Marin Comment on 2/14: Julie Carpenter is an 82 y.o. female whom I had taking care of previously with hx of CKD with baseline Cr about 1.5, hx of DM, HTN, HLD, afib with lower GI bleed unable to tolerate anticoagulation ( [facility] with AVM and tics--Dr Fields-2014), sent in from her home as she was recently taken from her SNF to home, and she told her daughter she didn't want to live "like this anymore" and took about 16 tablets of Elavil 50mg . In the ER, she became lethargic and confused, but is not tachycardic and able to maintain her hemodynamics and oral airway. Work up in the ER showed afib with controlled rate, and QTc of 427ms, with K of 3.2, Cr of 2.2, and Hb of 9 grams per dL, not a marked change from baseline. Her tylenol level was negative and Tox screen was negative. Poison control center recommended 12 hours of monitoring. Her daughter confirmed that she has been a DNR. Hospitalist was asked to admit her  for monitoring of TCA overdose and for psychiatric consultation.  Hospital Course:   Suicidal overdose -Intentional ingestion of 16 Elavil 50 mg tablets, she reported "I do not want to live like this anymore". -No apparent sequela. -Psychiatry continues to recommend inpatient psychiatric placement, bed available at [facility] today. -Further management by psychiatry.  Atrial fibrillation -Rate controlled. -Anticoagulated on Coumadin, INR subtherapeutic on discharge at 1.95. -Oh INR between 2-3, will need continued INR monitoring station and Coumadin dosing adjustment.  Chronic kidney disease stage IV -Creatinine is at baseline at 1.5-1.7.  Type 2 diabetes with hypoglycemia -Glimepiride has been discontinued. -CBGs have been stable for greater than 48 hours.   Procedures:  None   Consultations:  Psychiatry  Discharge Instructions  Discharge Instructions    Diet - low sodium heart healthy    Complete by:  As directed      Increase activity slowly    Complete by:  As directed             Medication List    STOP taking these medications        amitriptyline 50 MG tablet  Commonly known as:  ELAVIL     glimepiride 2 MG tablet  Commonly known as:  AMARYL      TAKE these medications        atorvastatin 40 MG tablet  Commonly known as:  LIPITOR  TAKE ONE (1) TABLET EACH DAY     calcitRIOL 0.25 MCG capsule  Commonly known as:  ROCALTROL  Take 0.25 mcg by mouth 3 (three) times a week. Mon.,Wed.,Fri.     carvedilol 12.5 MG tablet  Commonly known as:  COREG  Take 1 tablet (12.5 mg total) by mouth 2 (two) times daily with a meal.     epoetin alfa 2000 UNIT/ML injection  Commonly known as:  EPOGEN,PROCRIT  Inject 2,000 Units into the vein every 21 ( twenty-one) days. Patient has procrit administered at [facility] short stay every 3 weeks     fenofibrate 145 MG tablet  Commonly known as:  TRICOR  Take 1 tablet (145 mg total) by mouth daily.       furosemide 20 MG tablet  Commonly known as:  LASIX  Take 1 tablet (20 mg total) by mouth daily.     warfarin 2 MG tablet  Commonly known as:  COUMADIN  TAKE ONE-HALF TABLET BY MOUTH ONCE DAILY IN THE EVENING       Allergies  Allergen Reactions  . Penicillins Anaphylaxis    Has patient had a PCN reaction causing immediate rash, facial/tongue/throat swelling, SOB or lightheadedness with hypotension: Yes Has patient had a PCN reaction causing severe rash involving mucus membranes or skin necrosis: No Has patient had a PCN reaction that required hospitalization No Has patient had a PCN reaction occurring within the last 10 years: No If all of the above answers are "NO", then may proceed with Cephalosporin use.  Marland Kitchen Benicar [Olmesartan Medoxomil] Swelling    Lips swelling  . Ciprofloxacin Swelling  . Hctz [Hydrochlorothiazide] Swelling    Lips swelling  . Januvia [Sitagliptin Phosphate] Swelling    Lips swelling  . Norvasc [Amlodipine Besylate] Swelling    Lips swelling  . Ramipril Swelling    Lips swelling       Follow-up Information    Follow up with Eustaquio Maize, MD On 12/04/2015.   Specialty:  Pediatrics   Why:  at  10:00 am   Contact information:   [place] [place] [id number] [phone]        The results of significant diagnostics from this hospitalization (including imaging, microbiology, ancillary and laboratory) are listed below for reference.    Significant Diagnostic Studies: No results found.  Microbiology: No results found for this or any previous visit (from the past 240 hour(s)).   Labs: Basic Metabolic Panel:  Recent Labs Lab 10/26/15 0520 10/29/15 0630 11/01/15 0621  NA 142 136 137  K 4.2 4.5 4.0  CL 110 106 110  CO2 26 20* 21*  GLUCOSE 60* 91 145*  BUN 35* 55* 36*  CREATININE 2.14* 2.61* 1.68*  CALCIUM 8.9 8.6* 8.8*   Liver Function Tests:  Recent Labs Lab 10/26/15 0520  AST 82*  ALT 41  ALKPHOS 46  BILITOT 0.7   PROT 5.7*  ALBUMIN 2.8*   No results for input(s): LIPASE, AMYLASE in the last 168 hours. No results for input(s): AMMONIA in the last 168 hours. CBC: No results for input(s): WBC, NEUTROABS, HGB, HCT, MCV, PLT in the last 168 hours. Cardiac Enzymes: No results for input(s): CKTOTAL, CKMB, CKMBINDEX, TROPONINI in the last 168 hours. BNP: BNP (last 3 results)  Recent Labs  08/17/15 1636 08/22/15 1324  BNP 774.0* 1382.0*    ProBNP (last 3 results) No results for input(s): PROBNP in the last 8760 hours.  CBG:  Recent Labs Lab 10/31/15 1659 10/31/15 2026 11/01/15 0013 11/01/15 0322 11/01/15 0717  GLUCAP 246* 231* 165* 146* 135*  SignedLelon Frohlich  [facility] Pager: [phone] 11/01/2015, 9:43 AM

## 2015-11-01 NOTE — Progress Notes (Signed)
CSW spoke to [place] at [facility] this morning.  [facility] will have a bed for patient today after review of current DC summary. Notified Dr. Jerilee Hoh and dc summary updated; Jinny Blossom will send to [facility] for review and CSW will follow up. Lorie Phenix. Gean Larose, LCSW [phone](coverage)

## 2015-11-01 NOTE — Progress Notes (Signed)
Garnet Sierras, RN has accepted pt to [facility] [phone] [place], [place] [address]). Can arrive anytime today per [facility]. States that, for EMTALA purposes, her name should be used as receiving provider, and RN report can be called at [phone].   Sharren Bridge, MSW, LCSW Clinical Social Work, Disposition  11/01/2015 [phone]

## 2015-11-01 NOTE — Progress Notes (Signed)
Pt's CBG = 135. 

## 2015-11-01 NOTE — Plan of Care (Signed)
Report received by night shift nurse. Assuming care of pt. [place], [place].

## 2015-11-01 NOTE — Care Management Note (Signed)
Case Management Note  Patient Details  Name: ELAYSHA LUCIANO MRN: [id number] Date of Birth: 05-01-1926  Expected Discharge Date:     11/01/2015             Expected Discharge Plan:  [facility]  In-House Referral:  NA  Discharge planning Services  CM Consult  Post Acute Care Choice:  NA Choice offered to:  NA  DME Arranged:    DME Agency:     HH Arranged:    HH Agency:     Status of Service:  Completed, signed off  Medicare Important Message Given:  Yes Date Medicare IM Given:    Medicare IM give by:    Date Additional Medicare IM Given:    Additional Medicare Important Message give by:     If discussed at [place] of Stay Meetings, dates discussed:    Additional Comments: Pt discharged today. Pt discharged to inpt psych bed at [facility]. No CM needs at this time.    Sherald Barge, RN 11/01/2015, 1:39 PM

## 2015-11-01 NOTE — Progress Notes (Signed)
Faxed pt's physician d/c summary to [facility]. Spoke with Levada Dy in intake who states they will have it reviewed.   Sharren Bridge, MSW, LCSW Clinical Social Work, Disposition  11/01/2015 [phone]

## 2015-11-03 ENCOUNTER — Other Ambulatory Visit: Payer: Self-pay | Admitting: Nurse Practitioner

## 2015-11-06 ENCOUNTER — Ambulatory Visit: Payer: Medicare Other | Admitting: Pediatrics

## 2015-11-08 ENCOUNTER — Ambulatory Visit (INDEPENDENT_AMBULATORY_CARE_PROVIDER_SITE_OTHER): Payer: Medicare Other | Admitting: Pediatrics

## 2015-11-08 ENCOUNTER — Encounter (HOSPITAL_COMMUNITY)
Admission: RE | Admit: 2015-11-08 | Discharge: 2015-11-08 | Disposition: A | Discharge status: Home / Self Care | Payer: Medicare Other | Source: Ambulatory Visit | Attending: Nephrology | Admitting: Nephrology

## 2015-11-08 ENCOUNTER — Encounter: Payer: Self-pay | Admitting: Pediatrics

## 2015-11-08 VITALS — BP 120/81 | HR 97 | Temp 97.5°F | Ht 64.0 in | Wt 165.6 lb

## 2015-11-08 DIAGNOSIS — F332 Major depressive disorder, recurrent severe without psychotic features: Secondary | ICD-10-CM

## 2015-11-08 DIAGNOSIS — T50902A Poisoning by unspecified drugs, medicaments and biological substances, intentional self-harm, initial encounter: Secondary | ICD-10-CM | POA: Diagnosis not present

## 2015-11-08 DIAGNOSIS — D638 Anemia in other chronic diseases classified elsewhere: Secondary | ICD-10-CM | POA: Diagnosis not present

## 2015-11-08 DIAGNOSIS — I10 Essential (primary) hypertension: Secondary | ICD-10-CM

## 2015-11-08 DIAGNOSIS — F0391 Unspecified dementia with behavioral disturbance: Secondary | ICD-10-CM | POA: Diagnosis not present

## 2015-11-08 DIAGNOSIS — I481 Persistent atrial fibrillation: Secondary | ICD-10-CM

## 2015-11-08 DIAGNOSIS — N184 Chronic kidney disease, stage 4 (severe): Secondary | ICD-10-CM | POA: Diagnosis not present

## 2015-11-08 DIAGNOSIS — I4819 Other persistent atrial fibrillation: Secondary | ICD-10-CM

## 2015-11-08 DIAGNOSIS — E1122 Type 2 diabetes mellitus with diabetic chronic kidney disease: Secondary | ICD-10-CM

## 2015-11-08 LAB — HEMOGLOBIN AND HEMATOCRIT, BLOOD
HCT: 30.1 % — ABNORMAL LOW (ref 36.0–46.0)
Hemoglobin: 9.6 g/dL — ABNORMAL LOW (ref 12.0–15.0)

## 2015-11-08 LAB — FERRITIN: FERRITIN: 249 ng/mL (ref 11–307)

## 2015-11-08 LAB — IRON AND TIBC
IRON: 96 ug/dL (ref 28–170)
SATURATION RATIOS: 28 % (ref 10.4–31.8)
TIBC: 344 ug/dL (ref 250–450)
UIBC: 248 ug/dL

## 2015-11-08 LAB — POCT INR: INR: 2.9

## 2015-11-08 LAB — POCT GLYCOSYLATED HEMOGLOBIN (HGB A1C): Hemoglobin A1C: 6.9

## 2015-11-08 MED ORDER — EPOETIN ALFA 20000 UNIT/ML IJ SOLN
INTRAMUSCULAR | Status: AC
  Filled 2015-11-08: qty 1

## 2015-11-08 MED ORDER — EPOETIN ALFA 20000 UNIT/ML IJ SOLN
20000.0000 [IU] | Freq: Once | INTRAMUSCULAR | Status: AC
  Administered 2015-11-08: 20000 [IU] via SUBCUTANEOUS

## 2015-11-08 NOTE — Progress Notes (Signed)
Subjective:    Patient ID: Julie Carpenter, female    DOB: Nov 18, 1925, 82 y.o.   MRN: [id number]  CC: Hospitalization Follow-up   HPI: Julie Carpenter is a 82 y.o. female presenting for Hospitalization Follow-up  Discharged from [facility] two days ago There for a week Seen for overdose Started bactrim 4 days ago for UTI  Living at home with daughter Julie Carpenter  Not noticed any dark colored stool Going to get Hg checked today and may get epo shot at [facility] denies thoughts of wanting to harm herself Living with different daughter now Says she feels safe at home Denies ever trying to take too much medicine Happy to be out of the hospital Has no complaints   Depression screen [facility] 2/9 11/08/2015 10/04/2015 09/06/2015 08/17/2015 07/24/2015  Decreased Interest 0 0 0 0 0  Down, Depressed, Hopeless 0 0 0 0 0  PHQ - 2 Score 0 0 0 0 0     Relevant past medical, surgical, family and social history reviewed and updated as indicated. Interim medical history since our last visit reviewed. Allergies and medications reviewed and updated.    ROS: All systems negative other than what is in HPI  History  Smoking status  . Never Smoker   Smokeless tobacco  . Not on file    Past Medical History Patient Active Problem List   Diagnosis Date Noted  . Hypoglycemia 10/27/2015  . MDD (major depressive disorder), recurrent severe, without psychosis ([place]) 10/26/2015  . Dementia with behavioral disturbance 10/26/2015  . Chronic kidney disease (CKD), stage IV (severe) ([place])   . Suicidal overdose ([place]) 10/24/2015  . Depression 10/24/2015  . Other acute pulmonary embolism without acute cor pulmonale ([place]) 08/31/2015  . Long term (current) use of anticoagulants [Z79.01] 08/31/2015  . Acute renal failure superimposed on stage 4 chronic kidney disease ([place]) 08/28/2015  . Pulmonary embolism ([place]) 08/23/2015  . Shortness of breath 08/22/2015  . AKI (acute kidney injury) ([place])   . Dyspnea  08/17/2015  . DOE (dyspnea on exertion) 08/17/2015  . GERD (gastroesophageal reflux disease) 02/23/2014  . Peripheral edema 02/23/2014  . GI bleed 04/05/2013  . Atrial fibrillation ([place]) 02/24/2013  . HTN (hypertension) 12/26/2010  . Chronic kidney disease (CKD) stage G3b/A2, moderately decreased glomerular filtration rate (GFR) between 30-44 mL/min/1.73 square meter and albuminuria creatinine ratio between 30-299 mg/g 12/26/2010  . Hyperlipemia 12/26/2010  . Peripheral neuropathy ([place]) 12/26/2010  . Anemia 12/26/2010  . Diabetes ([place]) 12/26/2010  . Vertebral fracture 12/26/2010  . Venous insufficiency 12/26/2010  . Osteoporosis 12/26/2010  . Thyromegaly 12/26/2010    Current Outpatient Prescriptions  Medication Sig Dispense Refill  . calcitRIOL (ROCALTROL) 0.25 MCG capsule Take 0.25 mcg by mouth 3 (three) times a week. Mon.,Wed.,Fri.  5  . carvedilol (COREG) 12.5 MG tablet Take 1 tablet (12.5 mg total) by mouth 2 (two) times daily with a meal. 60 tablet 0  . ferrous sulfate 325 (65 FE) MG tablet Take 325 mg by mouth daily with breakfast.    . senna (SENOKOT) 8.6 MG TABS tablet Take 1 tablet by mouth.    . sodium bicarbonate 325 MG tablet Take 325 mg by mouth 4 (four) times daily.    Marland Kitchen sulfamethoxazole-trimethoprim (BACTRIM DS,SEPTRA DS) 800-160 MG tablet Take 1 tablet by mouth 2 (two) times daily.    Marland Kitchen warfarin (COUMADIN) 2 MG tablet TAKE ONE-HALF TABLET BY MOUTH ONCE DAILY IN THE EVENING (Patient taking differently: 3 mg. TAKE ONE-HALF  TABLET BY MOUTH ONCE DAILY IN THE EVENING)    . epoetin alfa (EPOGEN,PROCRIT) 2000 UNIT/ML injection Inject 2,000 Units into the vein every 21 ( twenty-one) days. Reported on 11/08/2015    . furosemide (LASIX) 20 MG tablet Take 1 tablet (20 mg total) by mouth daily. (Patient not taking: Reported on 11/08/2015) 90 tablet 1   No current facility-administered medications for this visit.       Objective:    BP 120/81 mmHg  Pulse 97  Temp(Src) 97.5 F  (36.4 C) (Oral)  Ht 5\' 4"  (1.626 m)  Wt 165 lb 9.6 oz (75.116 kg)  BMI 28.41 kg/m2  Wt Readings from Last 3 Encounters:  11/08/15 165 lb 9.6 oz (75.116 kg)  10/25/15 162 lb 14.7 oz (73.9 kg)  10/04/15 170 lb 3.2 oz (77.202 kg)     Gen: NAD, alert, cooperative with exam, NCAT EYES: EOMI, no scleral injection or icterus ENT:  OP without erythema CV: NRRR, normal S1/S2, no murmur, distal pulses 2+ b/l Resp: CTABL, no wheezes, normal WOB Abd: +BS, soft, NTND.  Ext: No edema, warm  Neuro: Alert  Psych: normal affect, well groomed     Assessment & Plan:    Julie Carpenter was seen today for hospitalization follow-up. I have reviewed the charts available to me. Overall is doing well Reviewed med list Pt feels safe at home  Diagnoses and all orders for this visit:  Essential hypertension Well controlled today. Cont current meds.  Persistent atrial fibrillation (HCC) Pt asking if she needs to stay on warfarin, at some point in hospital she was told maybe woul dbe able to come off of it for her PE. Pt with two indications for anticoagulation, possible PE and also with afib. CHA2DS2VASC score of at least 7, not able to eval LV dysfunction due to afib most recent ECHO, does have concentric LV hypertrophy, has a 11.2% yearly risk of CVA. Should stay on anticoagulation for her afib. -     POCT INR  Type 2 diabetes mellitus with chronic kidney disease, without long-term current use of insulin, unspecified CKD stage (HCC) -     POCT glycosylated hemoglobin (Hb A1C)   Follow up plan: Return for 2 weeks to see Tammy for INR, 4 weeks to see Dr. Evette Doffing or other provider.  Assunta Found, MD [facility] 11/08/2015, 9:49 AM

## 2015-11-16 ENCOUNTER — Telehealth: Payer: Self-pay | Admitting: Pediatrics

## 2015-11-16 NOTE — Telephone Encounter (Signed)
lmovm to try OTC Coricidin HBP or Mucinex sinus

## 2015-11-21 ENCOUNTER — Ambulatory Visit (INDEPENDENT_AMBULATORY_CARE_PROVIDER_SITE_OTHER): Payer: Medicare Other | Admitting: Pharmacist Clinician (PhC)/ Clinical Pharmacy Specialist

## 2015-11-21 DIAGNOSIS — Z7901 Long term (current) use of anticoagulants: Secondary | ICD-10-CM

## 2015-11-21 DIAGNOSIS — I2699 Other pulmonary embolism without acute cor pulmonale: Secondary | ICD-10-CM

## 2015-11-21 DIAGNOSIS — I482 Chronic atrial fibrillation, unspecified: Secondary | ICD-10-CM

## 2015-11-21 LAB — COAGUCHEK XS/INR WAIVED
INR: 1.1 (ref 0.9–1.1)
Prothrombin Time: 13.7 s

## 2015-11-21 NOTE — Patient Instructions (Signed)
Anticoagulation Dose Instructions as of 11/21/2015      Julie Carpenter Tue Wed Thu Fri Sat   New Dose 3 mg 3 mg 4 mg 4 mg 3 mg 3 mg 3 mg    Description        Take 2 tablets of warfarin 2mg  today and tomorrow and then take 1 1/2 tablets on Thursday.  INR will be re-checked on Friday.  INR 1.2 today

## 2015-11-22 ENCOUNTER — Encounter: Payer: Medicare Other | Admitting: Pharmacist

## 2015-11-24 ENCOUNTER — Ambulatory Visit (HOSPITAL_COMMUNITY)
Admission: RE | Admit: 2015-11-24 | Discharge: 2015-11-24 | Disposition: A | Discharge status: Home / Self Care | Payer: Medicare Other | Source: Ambulatory Visit | Attending: Pediatrics | Admitting: Pediatrics

## 2015-11-24 ENCOUNTER — Ambulatory Visit (INDEPENDENT_AMBULATORY_CARE_PROVIDER_SITE_OTHER): Payer: Medicare Other | Admitting: Pharmacist

## 2015-11-24 DIAGNOSIS — I82531 Chronic embolism and thrombosis of right popliteal vein: Secondary | ICD-10-CM | POA: Insufficient documentation

## 2015-11-24 DIAGNOSIS — I482 Chronic atrial fibrillation, unspecified: Secondary | ICD-10-CM

## 2015-11-24 DIAGNOSIS — M7121 Synovial cyst of popliteal space [Baker], right knee: Secondary | ICD-10-CM | POA: Diagnosis not present

## 2015-11-24 DIAGNOSIS — M7989 Other specified soft tissue disorders: Secondary | ICD-10-CM

## 2015-11-24 DIAGNOSIS — Z86711 Personal history of pulmonary embolism: Secondary | ICD-10-CM

## 2015-11-24 DIAGNOSIS — I2699 Other pulmonary embolism without acute cor pulmonale: Secondary | ICD-10-CM

## 2015-11-24 DIAGNOSIS — Z7901 Long term (current) use of anticoagulants: Secondary | ICD-10-CM | POA: Diagnosis not present

## 2015-11-24 LAB — COAGUCHEK XS/INR WAIVED
INR: 1.4 — AB (ref 0.9–1.1)
Prothrombin Time: 16.3 s

## 2015-11-24 NOTE — Progress Notes (Signed)
Subjective:     Indication: atrial fibrillation and PE Bleeding signs/symptoms: None Thromboembolic signs/symptoms: Yes - patient's right lower extremity continues to be swollen greater than right.  She has taken extra furosemide over last 3 days with 2# weight loss but no change in edema in LLE.   Missed Coumadin doses: None Medication changes: yes - warfarin increased 11/21/15 patient has taken 2 tablets daily for last 2 days. Dietary changes: no Bacterial/viral infection: no  The following portions of the patient's history were reviewed and updated as appropriate: allergies, current medications, past medical history and problem list.  Review of Systems Cardiovascular: positive for lower extremity edema   Objective:    INR Today: 1.4 (increased from 1.1 2 days ago   Assessment:    Subtherapeutic INR for goal of 2-3  Continued LEE with history of PE Plan:    1. New dose: increase warfarin to 2 tablets for next 3 days then increase to 1.5 tablet MWF and 2 tablets all other days.   2. Next INR: Monday 11/27/2015 - will look into getting MDINR for a patient to check at home since she is now living with daughter who is 1 hour away from our office.  3.  Referral for [place] of left leg.   Cherre Robins, PharmD, CPP

## 2015-11-24 NOTE — Patient Instructions (Addendum)
Anticoagulation Dose Instructions as of 11/24/2015      Julie Carpenter Tue Wed Thu Fri Sat   New Dose 4 mg 3 mg 4 mg 3 mg 4 mg 4 mg 4 mg   Alt Week 4 mg 3 mg 4 mg 3 mg 4 mg 3 mg 4 mg    Description        Take 2 tablets of warfarin for next 3 days (3/17 through 3/19).  Then start new dose of warfarin 1 and 1/2 tablets on mondays, Wednesday and fridays.  Take 2 tablets all other days.     INR was 1.4 today

## 2015-11-27 ENCOUNTER — Ambulatory Visit (INDEPENDENT_AMBULATORY_CARE_PROVIDER_SITE_OTHER): Payer: Medicare Other | Admitting: Pharmacist

## 2015-11-27 DIAGNOSIS — I482 Chronic atrial fibrillation, unspecified: Secondary | ICD-10-CM

## 2015-11-27 DIAGNOSIS — I2699 Other pulmonary embolism without acute cor pulmonale: Secondary | ICD-10-CM | POA: Diagnosis not present

## 2015-11-27 DIAGNOSIS — Z7901 Long term (current) use of anticoagulants: Secondary | ICD-10-CM | POA: Diagnosis not present

## 2015-11-27 LAB — COAGUCHEK XS/INR WAIVED
INR: 2.1 — ABNORMAL HIGH (ref 0.9–1.1)
PROTHROMBIN TIME: 24.7 s

## 2015-11-27 NOTE — Patient Instructions (Signed)
Anticoagulation Dose Instructions as of 11/27/2015      Julie Carpenter Tue Wed Thu Fri Sat   New Dose 4 mg 3 mg 4 mg 3 mg 4 mg 3 mg 4 mg    Description        Continue current dose of warfarin 1 and 1/2 tablets on mondays, Wednesday and fridays.  Take 2 tablets all other days.     INR was 2.1 today

## 2015-11-29 ENCOUNTER — Encounter (HOSPITAL_COMMUNITY)
Admission: RE | Admit: 2015-11-29 | Discharge: 2015-11-29 | Disposition: A | Discharge status: Home / Self Care | Payer: Medicare Other | Source: Ambulatory Visit | Attending: Nephrology | Admitting: Nephrology

## 2015-11-29 ENCOUNTER — Ambulatory Visit: Payer: Self-pay | Admitting: Pharmacist

## 2015-11-29 ENCOUNTER — Ambulatory Visit (HOSPITAL_COMMUNITY): Payer: Self-pay

## 2015-11-29 ENCOUNTER — Other Ambulatory Visit (HOSPITAL_COMMUNITY): Payer: Self-pay

## 2015-11-29 DIAGNOSIS — N184 Chronic kidney disease, stage 4 (severe): Secondary | ICD-10-CM | POA: Diagnosis not present

## 2015-11-29 LAB — HEMOGLOBIN AND HEMATOCRIT, BLOOD
HCT: 36.7 % (ref 36.0–46.0)
HEMOGLOBIN: 11.4 g/dL — AB (ref 12.0–15.0)

## 2015-11-29 MED ORDER — EPOETIN ALFA 20000 UNIT/ML IJ SOLN
INTRAMUSCULAR | Status: AC
  Filled 2015-11-29: qty 1

## 2015-11-29 MED ORDER — EPOETIN ALFA 20000 UNIT/ML IJ SOLN
20000.0000 [IU] | INTRAMUSCULAR | Status: DC
  Administered 2015-11-29: 20000 [IU] via SUBCUTANEOUS

## 2015-12-06 ENCOUNTER — Encounter: Payer: Self-pay | Admitting: Pediatrics

## 2015-12-06 ENCOUNTER — Ambulatory Visit (INDEPENDENT_AMBULATORY_CARE_PROVIDER_SITE_OTHER): Payer: Medicare Other | Admitting: Pediatrics

## 2015-12-06 VITALS — BP 137/83 | HR 91 | Temp 97.6°F | Ht 64.0 in | Wt 161.6 lb

## 2015-12-06 DIAGNOSIS — I10 Essential (primary) hypertension: Secondary | ICD-10-CM | POA: Diagnosis not present

## 2015-12-06 DIAGNOSIS — N183 Chronic kidney disease, stage 3 (moderate): Secondary | ICD-10-CM

## 2015-12-06 DIAGNOSIS — F332 Major depressive disorder, recurrent severe without psychotic features: Secondary | ICD-10-CM

## 2015-12-06 DIAGNOSIS — E1122 Type 2 diabetes mellitus with diabetic chronic kidney disease: Secondary | ICD-10-CM | POA: Diagnosis not present

## 2015-12-06 DIAGNOSIS — I482 Chronic atrial fibrillation, unspecified: Secondary | ICD-10-CM

## 2015-12-06 DIAGNOSIS — Z7901 Long term (current) use of anticoagulants: Secondary | ICD-10-CM | POA: Diagnosis not present

## 2015-12-06 DIAGNOSIS — D649 Anemia, unspecified: Secondary | ICD-10-CM

## 2015-12-06 DIAGNOSIS — N1832 Chronic kidney disease, stage 3b: Secondary | ICD-10-CM

## 2015-12-06 NOTE — Progress Notes (Signed)
    Subjective:    Patient ID: Julie Carpenter, female    DOB: 1926/05/26, 82 y.o.   MRN: [id number]  CC: Follow-up multiple med problems  HPI: Julie Carpenter is a 82 y.o. female presenting for Follow-up  Recently moved into [facility] Still within minutes of daughter Julie Carpenter's house and work Moved in last week, going well so far No heart racing BGLs in the morning mid 200s Not currently on anything for her DM2 given chronic renal disease Daughter thinks these are likely from breakfast, not before eating anything Minimal swelling in legs, 40mg  lasix has helped a lot Has appt with kidney doctor in June. Gets epo shots q3 weeks.  Trying to get INR machine for home as now lives an hour away from clinic. Appetite has been good    Depression screen [facility] 2/9 12/06/2015 11/08/2015 10/04/2015 09/06/2015 08/17/2015  Decreased Interest 0 0 0 0 0  Down, Depressed, Hopeless 0 0 0 0 0  PHQ - 2 Score 0 0 0 0 0     Relevant past medical, surgical, family and social history reviewed and updated as indicated. Interim medical history since our last visit reviewed. Allergies and medications reviewed and updated.    ROS: Per HPI unless specifically indicated above  History  Smoking status  . Never Smoker   Smokeless tobacco  . Not on file        Objective:    BP 137/83 mmHg  Pulse 91  Temp(Src) 97.6 F (36.4 C) (Oral)  Ht 5\' 4"  (1.626 m)  Wt 161 lb 9.6 oz (73.301 kg)  BMI 27.72 kg/m2  Wt Readings from Last 3 Encounters:  12/06/15 161 lb 9.6 oz (73.301 kg)  11/29/15 165 lb (74.844 kg)  11/08/15 165 lb 9.6 oz (75.116 kg)    Gen: NAD, alert, cooperative with exam, NCAT EYES: EOMI, no scleral injection or icterus ENT:  OP without erythema LYMPH: no cervical LAD CV: irregular rhythm, nl rate, normal S1/S2, no murmur, distal pulses 2+ b/l Resp: CTABL, no wheezes, normal WOB Abd: +BS, soft, NTND.  Ext: No edema, warm Neuro: Alert and oriented, strength equal b/l UE and  LE, coordination grossly normal MSK: normal muscle bulk Psych: normal affect     Assessment & Plan:    [name] was seen today for follow-up.  Diagnoses and all orders for this visit:  Chronic atrial fibrillation (HCC) Stable, cont carvedilol, warfarin  Essential hypertension Adequate control, continue meds  Chronic kidney disease (CKD) stage G3b/A2, moderately decreased glomerular filtration rate (GFR) between 30-44 mL/min/1.73 square meter and albuminuria creatinine ratio between 30-299 mg/g Has follow up with nephrologist in a couple of months.  Continue sodium bicarbonate  MDD (major depressive disorder), recurrent severe, without psychosis ([place]) Mood good today Seems to be transitioning well to new ALF Feels supported  Anemia, unspecified anemia type Due to chronic kidney disease, on epo  Long term current use of anticoagulant therapy INR recently checked, was normal Trying to get home INR machine  Diabetes  Continue fasting AM BGLs Let me know if regularly elevated Not clear if checking fasting BGLs at this time Most recent HgA1c 6.9 on glimeperide Now off of all diabetes meds, limited options due to kidney disease.  Follow up plan: 2 months   Assunta Found, MD [facility] 12/06/2015, 11:42 AM

## 2015-12-07 NOTE — Progress Notes (Signed)
Results for Julie Carpenter, Julie Carpenter (MRN [id number]) as of 12/07/2015 07:42  Results as of 11/29/2015   Ref. Range 11/29/2015 14:16  Hemoglobin Latest Ref Range: 12.0-15.0 g/dL 11.4 (L)  HCT Latest Ref Range: 36.0-46.0 % 36.7

## 2015-12-18 ENCOUNTER — Telehealth: Payer: Self-pay | Admitting: Pediatrics

## 2015-12-18 NOTE — Telephone Encounter (Signed)
Julie Carpenter now resides at [facility]  She is not a patient of the doctor who cares for the other residents, therefore he will not monitor her INR levels.  She prefers to keep her chronic health visits with [facility]  Her daughter would like for her granddaughter who is a Equities trader to be able to moniter her levels and call them in. Could she please have an order for a machine and an allow her grand-daughter to call in results?  Neither Tammy or Dr. Evette Doffing are available.  There is a small note concerning ordering a machine in the last office visit.  Please advise and send back to our [place].

## 2015-12-19 NOTE — Telephone Encounter (Signed)
I am not sure how to order this

## 2015-12-20 ENCOUNTER — Other Ambulatory Visit (HOSPITAL_COMMUNITY): Payer: Self-pay

## 2015-12-20 ENCOUNTER — Encounter (HOSPITAL_COMMUNITY)
Admission: RE | Admit: 2015-12-20 | Discharge: 2015-12-20 | Disposition: A | Discharge status: Home / Self Care | Payer: Medicare Other | Source: Ambulatory Visit | Attending: Nephrology | Admitting: Nephrology

## 2015-12-20 ENCOUNTER — Encounter (HOSPITAL_COMMUNITY): Payer: Self-pay

## 2015-12-20 ENCOUNTER — Ambulatory Visit (HOSPITAL_COMMUNITY): Payer: Self-pay

## 2015-12-20 DIAGNOSIS — D638 Anemia in other chronic diseases classified elsewhere: Secondary | ICD-10-CM | POA: Insufficient documentation

## 2015-12-20 DIAGNOSIS — N184 Chronic kidney disease, stage 4 (severe): Secondary | ICD-10-CM | POA: Insufficient documentation

## 2015-12-20 LAB — HEMOGLOBIN AND HEMATOCRIT, BLOOD
HEMATOCRIT: 41.3 % (ref 36.0–46.0)
Hemoglobin: 13.1 g/dL (ref 12.0–15.0)

## 2015-12-20 NOTE — Progress Notes (Signed)
Results for Julie Carpenter, Julie Carpenter (MRN [id number]) as of 12/20/2015 14:50  Ref. Range 12/20/2015 13:50  Hemoglobin Latest Ref Range: 12.0-15.0 g/dL 13.1  HCT Latest Ref Range: 36.0-46.0 % 41.3

## 2015-12-20 NOTE — Telephone Encounter (Signed)
Please  write and I will sign. Thanks, WS 

## 2015-12-20 NOTE — Telephone Encounter (Signed)
Spoke with pt's daughter regarding INR  She stated pt had INR checked at [facility] today and resulted 4.1 Also spoke with Kendrick Fries at the [facility] to hold Warfarin dose for today Kendrick Fries informed pt had already taken Warfarin 3mg  at 5 pm Per MMM pt needs INR rechecked on Thurs 4 /08/2016 Pt's daughter verbalizes understanding Also informed daughter that in order for [facility] provider to manage INR the resulting agency will have to fax results to [place]

## 2015-12-21 ENCOUNTER — Encounter (INDEPENDENT_AMBULATORY_CARE_PROVIDER_SITE_OTHER): Payer: Self-pay

## 2015-12-21 ENCOUNTER — Other Ambulatory Visit (INDEPENDENT_AMBULATORY_CARE_PROVIDER_SITE_OTHER): Payer: Medicare Other | Admitting: Nurse Practitioner

## 2015-12-21 ENCOUNTER — Telehealth: Payer: Self-pay | Admitting: Pediatrics

## 2015-12-21 VITALS — BP 139/83 | HR 87 | Temp 97.1°F | Wt 159.0 lb

## 2015-12-21 DIAGNOSIS — Z7901 Long term (current) use of anticoagulants: Secondary | ICD-10-CM | POA: Diagnosis not present

## 2015-12-21 DIAGNOSIS — I482 Chronic atrial fibrillation, unspecified: Secondary | ICD-10-CM

## 2015-12-21 DIAGNOSIS — I2699 Other pulmonary embolism without acute cor pulmonale: Secondary | ICD-10-CM

## 2015-12-21 LAB — COAGUCHEK XS/INR WAIVED
INR: 4.3 — AB (ref 0.9–1.1)
Prothrombin Time: 51.7 s

## 2015-12-21 LAB — PROTIME-INR: INR: 4.3 — AB (ref <=1.1)

## 2015-12-21 NOTE — Patient Instructions (Signed)
Anticoagulation Dose Instructions as of 12/21/2015      Dorene Grebe Tue Wed Thu Fri Sat   New Dose 4 mg 3 mg 4 mg 3 mg 4 mg 3 mg 4 mg    Description        Hold today then continue current dose- recheck INR at health department next Monday.

## 2015-12-21 NOTE — Telephone Encounter (Signed)
Notified that forms were faxed Julie Carpenter will call back if not recieved

## 2015-12-21 NOTE — Progress Notes (Signed)
   Subjective:    Patient ID: Julie Carpenter, female    DOB: 11-07-1925, 82 y.o.   MRN: [id number]  HPI Patient in today for INR- she has a long history of atrial fib and has been on coumadin for several years. Denies palpitations or chest pain.  See anticoagulation documentation from 12/21/15  Review of Systems  Constitutional: Negative.   HENT: Negative.   Respiratory: Negative.   Cardiovascular: Negative.   Genitourinary: Negative.   Neurological: Negative.   Psychiatric/Behavioral: Negative.   All other systems reviewed and are negative.      Objective:   Physical Exam  No physical assessment done      Assessment & Plan:   1. Other acute pulmonary embolism without acute cor pulmonale ([place])   2. Chronic atrial fibrillation (HCC)   3. Long term (current) use of anticoagulants    Hold coumadin dose today then back on regular dose tomorrow Recheck INR at health department Monday and have results faxed to office  [place], FNP

## 2015-12-25 ENCOUNTER — Ambulatory Visit (INDEPENDENT_AMBULATORY_CARE_PROVIDER_SITE_OTHER): Payer: Medicare Other | Admitting: Pharmacist

## 2015-12-25 ENCOUNTER — Telehealth: Payer: Self-pay | Admitting: *Deleted

## 2015-12-25 DIAGNOSIS — I482 Chronic atrial fibrillation, unspecified: Secondary | ICD-10-CM

## 2015-12-25 DIAGNOSIS — Z7901 Long term (current) use of anticoagulants: Secondary | ICD-10-CM

## 2015-12-25 DIAGNOSIS — I2699 Other pulmonary embolism without acute cor pulmonale: Secondary | ICD-10-CM

## 2015-12-25 LAB — POCT INR: INR: 2.9

## 2015-12-25 NOTE — Telephone Encounter (Signed)
INR 2.9

## 2015-12-25 NOTE — Telephone Encounter (Signed)
Spoke with patient's daughter and advised of INR results. Also we are going to retry getting MDINR / home monitor so that patient's granddaughter who is a Marine scientist can check INR at [facility].  Also spoke with Evonne at [facility]. I am faxing [phone]) over INR results and change in warfarin directions - patient to start warfain 3mg  daily.

## 2015-12-25 NOTE — Patient Instructions (Signed)
Anticoagulation Dose Instructions as of 12/25/2015      Dorene Grebe Tue Wed Thu Fri Sat   New Dose 3 mg 3 mg 3 mg 3 mg 3 mg 3 mg 3 mg    Description        Change warfarin dose to 3mg  daily      INR was 2.9 today (checked at [facility])  Recommend recheck INR in 1 week around 01/01/2016

## 2016-01-03 ENCOUNTER — Telehealth: Payer: Self-pay | Admitting: *Deleted

## 2016-01-03 ENCOUNTER — Encounter (HOSPITAL_COMMUNITY)
Admission: RE | Admit: 2016-01-03 | Discharge: 2016-01-03 | Disposition: A | Discharge status: Home / Self Care | Payer: Medicare Other | Source: Ambulatory Visit | Attending: Nephrology | Admitting: Nephrology

## 2016-01-03 DIAGNOSIS — N184 Chronic kidney disease, stage 4 (severe): Secondary | ICD-10-CM | POA: Diagnosis not present

## 2016-01-03 LAB — HEMOGLOBIN AND HEMATOCRIT, BLOOD
HCT: 38.9 % (ref 36.0–46.0)
HEMOGLOBIN: 12.7 g/dL (ref 12.0–15.0)

## 2016-01-03 NOTE — Telephone Encounter (Signed)
Mandy from [facility] called back - they received shipment of Julie Carpenter's INR monitor and they are going out to [employer] to teach granddaughter how to check patient's INR.

## 2016-01-03 NOTE — Telephone Encounter (Signed)
Tried to call - no ans / LM on VM 

## 2016-01-03 NOTE — Progress Notes (Signed)
Results for MELMA, LAZOR (MRN [id number]) as of 01/03/2016 11:45  Ref. Range 01/03/2016 11:14  Hemoglobin Latest Ref Range: 12.0-15.0 g/dL 12.7  HCT Latest Ref Range: 36.0-46.0 % 38.9

## 2016-01-03 NOTE — Telephone Encounter (Signed)
Please call, she has questions on patient

## 2016-01-09 ENCOUNTER — Ambulatory Visit (INDEPENDENT_AMBULATORY_CARE_PROVIDER_SITE_OTHER): Payer: Medicare Other | Admitting: Nurse Practitioner

## 2016-01-09 ENCOUNTER — Telehealth: Payer: Self-pay | Admitting: Pediatrics

## 2016-01-09 DIAGNOSIS — I482 Chronic atrial fibrillation, unspecified: Secondary | ICD-10-CM

## 2016-01-09 DIAGNOSIS — Z7901 Long term (current) use of anticoagulants: Secondary | ICD-10-CM

## 2016-01-09 DIAGNOSIS — I2699 Other pulmonary embolism without acute cor pulmonale: Secondary | ICD-10-CM

## 2016-01-09 LAB — PROTIME-INR: INR: 4 — AB (ref <=1.1)

## 2016-01-09 NOTE — Progress Notes (Signed)
   Subjective:    Patient ID: Julie Carpenter, female    DOB: 1926/08/30, 82 y.o.   MRN: [id number]  HPI  See anticoag documentation on 01/09/16   Review of Systems     Objective:   Physical Exam        Assessment & Plan:

## 2016-01-09 NOTE — Patient Instructions (Signed)
Anticoagulation Dose Instructions as of 01/09/2016      Dorene Grebe Tue Wed Thu Fri Sat   New Dose 2 mg 3 mg 3 mg 3 mg 2 mg 3 mg 3 mg    Description        Hold today dose then 1 tablet on Thursday and sundays and 1.5 tablets all other days

## 2016-01-11 NOTE — Telephone Encounter (Signed)
Gave message to schedule patient for a visit with provider if blood sugars are remaining elevated.

## 2016-01-11 NOTE — Telephone Encounter (Signed)
Needs to be seen please

## 2016-01-15 ENCOUNTER — Ambulatory Visit (INDEPENDENT_AMBULATORY_CARE_PROVIDER_SITE_OTHER): Payer: Medicare Other | Admitting: Pharmacist

## 2016-01-15 ENCOUNTER — Encounter: Payer: Self-pay | Admitting: *Deleted

## 2016-01-15 DIAGNOSIS — I2699 Other pulmonary embolism without acute cor pulmonale: Secondary | ICD-10-CM

## 2016-01-15 DIAGNOSIS — I482 Chronic atrial fibrillation, unspecified: Secondary | ICD-10-CM

## 2016-01-15 DIAGNOSIS — Z7901 Long term (current) use of anticoagulants: Secondary | ICD-10-CM

## 2016-01-15 LAB — POCT INR: INR: 2.7

## 2016-01-16 ENCOUNTER — Encounter: Payer: Self-pay | Admitting: Nurse Practitioner

## 2016-01-17 ENCOUNTER — Other Ambulatory Visit (HOSPITAL_COMMUNITY): Payer: Self-pay

## 2016-01-17 ENCOUNTER — Ambulatory Visit (HOSPITAL_COMMUNITY): Payer: Self-pay

## 2016-01-21 ENCOUNTER — Encounter (HOSPITAL_COMMUNITY): Payer: Self-pay | Admitting: Emergency Medicine

## 2016-01-21 ENCOUNTER — Emergency Department (HOSPITAL_COMMUNITY)
Admission: EM | Admit: 2016-01-21 | Discharge: 2016-01-21 | Disposition: A | Discharge status: Home / Self Care | Payer: Medicare Other | Attending: Emergency Medicine | Admitting: Emergency Medicine

## 2016-01-21 DIAGNOSIS — I129 Hypertensive chronic kidney disease with stage 1 through stage 4 chronic kidney disease, or unspecified chronic kidney disease: Secondary | ICD-10-CM | POA: Diagnosis not present

## 2016-01-21 DIAGNOSIS — Z79899 Other long term (current) drug therapy: Secondary | ICD-10-CM | POA: Diagnosis not present

## 2016-01-21 DIAGNOSIS — N184 Chronic kidney disease, stage 4 (severe): Secondary | ICD-10-CM | POA: Diagnosis not present

## 2016-01-21 DIAGNOSIS — I4891 Unspecified atrial fibrillation: Secondary | ICD-10-CM | POA: Insufficient documentation

## 2016-01-21 DIAGNOSIS — E785 Hyperlipidemia, unspecified: Secondary | ICD-10-CM | POA: Insufficient documentation

## 2016-01-21 DIAGNOSIS — E1165 Type 2 diabetes mellitus with hyperglycemia: Secondary | ICD-10-CM | POA: Insufficient documentation

## 2016-01-21 LAB — URINALYSIS, ROUTINE W REFLEX MICROSCOPIC
Bilirubin Urine: NEGATIVE
KETONES UR: NEGATIVE mg/dL
LEUKOCYTES UA: NEGATIVE
Nitrite: NEGATIVE
PROTEIN: NEGATIVE mg/dL
Specific Gravity, Urine: <1.005 — ABNORMAL LOW (ref 1.005–1.030)
pH: 6.5 (ref 5.0–8.0)

## 2016-01-21 LAB — CBG MONITORING, ED
GLUCOSE-CAPILLARY: 483 mg/dL — AB (ref 65–99)
GLUCOSE-CAPILLARY: 486 mg/dL — AB (ref 65–99)
GLUCOSE-CAPILLARY: 271 mg/dL — AB (ref 65–99)
Glucose-Capillary: 468 mg/dL — ABNORMAL HIGH (ref 65–99)

## 2016-01-21 LAB — PROTIME-INR
INR: 2.2 — AB (ref 0.00–1.49)
Prothrombin Time: 24.2 seconds — ABNORMAL HIGH (ref 11.6–15.2)

## 2016-01-21 LAB — URINE MICROSCOPIC-ADD ON

## 2016-01-21 LAB — COMPREHENSIVE METABOLIC PANEL
ALT: 14 U/L (ref 14–54)
AST: 26 U/L (ref 15–41)
Albumin: 3.3 g/dL — ABNORMAL LOW (ref 3.5–5.0)
Alkaline Phosphatase: 107 U/L (ref 38–126)
Anion gap: 7 (ref 5–15)
BUN: 32 mg/dL — AB (ref 6–20)
CALCIUM: 8.7 mg/dL — AB (ref 8.9–10.3)
CO2: 25 mmol/L (ref 22–32)
CREATININE: 1.9 mg/dL — AB (ref 0.44–1.00)
Chloride: 94 mmol/L — ABNORMAL LOW (ref 101–111)
GFR, EST AFRICAN AMERICAN: 26 mL/min — AB (ref >60)
GFR, EST NON AFRICAN AMERICAN: 23 mL/min — AB (ref >60)
GLUCOSE: 505 mg/dL — AB (ref 65–99)
POTASSIUM: 3.6 mmol/L (ref 3.5–5.1)
Sodium: 126 mmol/L — ABNORMAL LOW (ref 135–145)
TOTAL PROTEIN: 6.5 g/dL (ref 6.5–8.1)
Total Bilirubin: 0.5 mg/dL (ref 0.3–1.2)

## 2016-01-21 LAB — CBC WITH DIFFERENTIAL/PLATELET
BASOS ABS: 0.1 10*3/uL (ref 0.0–0.1)
BASOS PCT: 1 %
EOS ABS: 0.2 10*3/uL (ref 0.0–0.7)
EOS PCT: 3 %
HCT: 36.4 % (ref 36.0–46.0)
Hemoglobin: 11.8 g/dL — ABNORMAL LOW (ref 12.0–15.0)
Lymphocytes Relative: 26 %
Lymphs Abs: 1.8 10*3/uL (ref 0.7–4.0)
MCH: 27.2 pg (ref 26.0–34.0)
MCHC: 32.4 g/dL (ref 30.0–36.0)
MCV: 83.9 fL (ref 78.0–100.0)
MONO ABS: 0.7 10*3/uL (ref 0.1–1.0)
Monocytes Relative: 11 %
Neutro Abs: 3.9 10*3/uL (ref 1.7–7.7)
Neutrophils Relative %: 59 %
PLATELETS: 192 10*3/uL (ref 150–400)
RBC: 4.34 MIL/uL (ref 3.87–5.11)
RDW: 16.4 % — AB (ref 11.5–15.5)
WBC: 6.7 10*3/uL (ref 4.0–10.5)

## 2016-01-21 MED ORDER — INSULIN ASPART 100 UNIT/ML ~~LOC~~ SOLN
10.0000 [IU] | Freq: Once | SUBCUTANEOUS | Status: AC
  Administered 2016-01-21: 10 [IU] via INTRAVENOUS
  Filled 2016-01-21: qty 1

## 2016-01-21 MED ORDER — SODIUM CHLORIDE 0.9 % IV SOLN
INTRAVENOUS | Status: DC
  Administered 2016-01-21: 19:00:00 via INTRAVENOUS

## 2016-01-21 MED ORDER — GLIMEPIRIDE 1 MG PO TABS: 1.0000 mg | ORAL_TABLET | Freq: Every day | ORAL | Status: AC

## 2016-01-21 MED ORDER — INSULIN REGULAR BOLUS VIA INFUSION: 10.0000 [IU] | Freq: Once | INTRAVENOUS | Status: DC

## 2016-01-21 MED ORDER — DEXTROSE-NACL 5-0.45 % IV SOLN: INTRAVENOUS | Status: DC

## 2016-01-21 NOTE — ED Provider Notes (Signed)
CSN: [id number]     Arrival date & time 01/21/16  1826 History   First MD Initiated Contact with Patient 01/21/16 [place]     Chief Complaint  Patient presents with  . Hyperglycemia   PT WAS ADMITTED IN February FOR AN INTENTIONAL ELAVIL OVERDOSE.  WHILE ADMITTED, SHE WAS HYPOGLYCEMIC.  HER AMARYL WAS HELD DURING HER ADMISSION AND IT WAS NEVER RESTARTED.  PT'S BLOOD SUGAR HAS BEEN INCREASING SINCE THEN.  TODAY, IT WAS 576 AT THE SNF AND THEY CALLED HER DAUGHTER TO BRING HER HERE.  PT SAID THAT SHE FEELS FINE.  (Consider location/radiation/quality/duration/timing/severity/associated sxs/prior Treatment) Patient is a 82 y.o. female presenting with hyperglycemia. The history is provided by the patient and a relative.  Hyperglycemia Blood sugar level PTA:  576 Severity:  Moderate Onset quality:  Gradual Timing:  Constant Progression:  Worsening Chronicity:  Chronic Current diabetic therapy:  NO MEDS   Past Medical History  Diagnosis Date  . Hyperlipidemia   . Hypertension   . Diabetes mellitus without complication ([place])   . Atrial fibrillation ([place])   . PE (pulmonary embolism)   . DVT (deep venous thrombosis) [facility])    Past Surgical History  Procedure Laterality Date  . Abdominal hysterectomy    . Cholecystectomy    . Bladder tack    . Esophagogastroduodenoscopy N/A 04/06/2013    Procedure: ESOPHAGOGASTRODUODENOSCOPY (EGD);  Surgeon: Danie Binder, MD;  [facility];  Service: Endoscopy;  Laterality: N/A;  8:45  . Givens capsule study  04/06/2013    Procedure: GIVENS CAPSULE STUDY;  Surgeon: Danie Binder, MD;  Location: [facility];  Service: Endoscopy;;  . Colonoscopy N/A 05/03/2013    [id number] MOST LIKELY DUE TO ONE AVM in the ascending colon/Moderate diverticulosis hroughout the entire examined colon/Two POLYPS REMOVED/Small internal hemorrhoids   Family History  Problem Relation Age of Onset  . Colon cancer Neg Hx    Social History  Substance Use Topics  .  Smoking status: Never Smoker   . Smokeless tobacco: Never Used  . Alcohol Use: No   OB History    Gravida Para Term Preterm AB TAB SAB Ectopic Multiple Living   2 2 2       2      Review of Systems  All other systems reviewed and are negative.     Allergies  Penicillins; Benicar; Ciprofloxacin; Hctz; Januvia; Norvasc; and Ramipril  Home Medications   Prior to Admission medications   Medication Sig Start Date End Date Taking? Authorizing Provider  calcitRIOL (ROCALTROL) 0.25 MCG capsule Take 0.25 mcg by mouth 3 (three) times a week. Tuesdays,THursdays, and Saturdays 07/11/15  Yes Historical Provider, MD  carvedilol (COREG) 12.5 MG tablet Take 1 tablet (12.5 mg total) by mouth 2 (two) times daily with a meal. 08/18/15  Yes Albertine Patricia, MD  epoetin alfa (EPOGEN,PROCRIT) 2000 UNIT/ML injection Inject 2,000 Units into the vein every 21 ( twenty-one) days. Reported on 11/08/2015   Yes Historical Provider, MD  furosemide (LASIX) 40 MG tablet Take 40 mg by mouth every morning.   Yes Historical Provider, MD  senna (SENOKOT) 8.6 MG TABS tablet Take 1 tablet by mouth daily as needed for mild constipation or moderate constipation.    Yes Historical Provider, MD  sodium bicarbonate 325 MG tablet Take 325 mg by mouth 2 (two) times daily.    Yes Historical Provider, MD  warfarin (COUMADIN) 2 MG tablet TAKE ONE-HALF TABLET BY MOUTH ONCE DAILY IN THE EVENING Patient  taking differently: Take 3 mg by mouth daily. Take 2mg  on Sundays and Thursdays. Take 3mg  on all other days 10/26/15  Yes Samuella Cota, MD  glimepiride (AMARYL) 1 MG tablet Take 1 tablet (1 mg total) by mouth daily with breakfast. 01/21/16   Isla Pence, MD   BP 109/72 mmHg  Pulse 70  Temp(Src) 97.9 F (36.6 C) (Oral)  Resp 19  SpO2 98% Physical Exam  Constitutional: She is oriented to person, place, and time. She appears well-developed and well-nourished.  HENT:  Head: Normocephalic and atraumatic.  Right Ear: External  ear normal.  Left Ear: External ear normal.  Nose: Nose normal.  Mouth/Throat: Oropharynx is clear and moist.  Eyes: Conjunctivae and EOM are normal. Pupils are equal, round, and reactive to light.  Neck: Normal range of motion. Neck supple.  Cardiovascular: Normal rate, normal heart sounds and intact distal pulses.  An irregularly irregular rhythm present.  Pulmonary/Chest: Effort normal and breath sounds normal.  Abdominal: Soft. Bowel sounds are normal.  Musculoskeletal: Normal range of motion.  Neurological: She is alert and oriented to person, place, and time.  Skin: Skin is warm and dry.  Psychiatric: She has a normal mood and affect. Her behavior is normal. Judgment and thought content normal.  Nursing note and vitals reviewed.   ED Course  Procedures (including critical care time) Labs Review Labs Reviewed  COMPREHENSIVE METABOLIC PANEL - Abnormal; Notable for the following:    Sodium 126 (*)    Chloride 94 (*)    Glucose, Bld 505 (*)    BUN 32 (*)    Creatinine, Ser 1.90 (*)    Calcium 8.7 (*)    Albumin 3.3 (*)    GFR calc non Af Amer 23 (*)    GFR calc Af Amer 26 (*)    All other components within normal limits  CBC WITH DIFFERENTIAL/PLATELET - Abnormal; Notable for the following:    Hemoglobin 11.8 (*)    RDW 16.4 (*)    All other components within normal limits  URINALYSIS, ROUTINE W REFLEX MICROSCOPIC (NOT AT [facility]) - Abnormal; Notable for the following:    Specific Gravity, Urine <1.005 (*)    Glucose, UA >1000 (*)    Hgb urine dipstick TRACE (*)    All other components within normal limits  PROTIME-INR - Abnormal; Notable for the following:    Prothrombin Time 24.2 (*)    INR 2.20 (*)    All other components within normal limits  URINE MICROSCOPIC-ADD ON - Abnormal; Notable for the following:    Squamous Epithelial / LPF 0-5 (*)    Bacteria, UA FEW (*)    All other components within normal limits  CBG MONITORING, ED - Abnormal; Notable for the following:     Glucose-Capillary 486 (*)    All other components within normal limits  CBG MONITORING, ED - Abnormal; Notable for the following:    Glucose-Capillary 483 (*)    All other components within normal limits  CBG MONITORING, ED - Abnormal; Notable for the following:    Glucose-Capillary 468 (*)    All other components within normal limits  CBG MONITORING, ED - Abnormal; Notable for the following:    Glucose-Capillary 271 (*)    All other components within normal limits    Imaging Review No results found. I have personally reviewed and evaluated these images and lab results as part of my medical decision-making.   EKG Interpretation   Date/Time:  Sunday Jan 21 2016  18:58:18 EDT Ventricular Rate:  80 PR Interval:    QRS Duration: 91 QT Interval:  402 QTC Calculation: 464 R Axis:   62 Text Interpretation:  Atrial fibrillation Low voltage, precordial leads  Confirmed by Lindell Renfrew MD, Kolter Reaver ([id number]) on 01/21/2016 7:07:16 PM      MDM  SINCE PT'S BS WAS TOO LOW WITH 2 MG OF AMARYL AND HER GFR IS 23, I WILL START HER ON 1 MG OF AMARYL.  HER PCP CAN START OR CHANGE AS WANTED, BUT THAT WILL GET PT THROUGH UNTIL SHE CAN SEE HER PCP. PT'S BS HAS IMPROVED.  PT AND DAUGHTER OK WITH PLAN.  PT KNOWS TO RETURN IF WORSE. Final diagnoses:  Poorly controlled type 2 diabetes mellitus ([place])  Chronic kidney disease (CKD), stage IV (severe) (HCC)       Isla Pence, MD 01/21/16 2106

## 2016-01-21 NOTE — ED Notes (Signed)
Report given to Evion at [facility], discharge prescription for 1mg  Amaryl reviewed with nurse. Patients daughter has a full prescription bottle of Amaryl 2mg  instructed nurse the tablets were scored and could be cut in half, per Evion they can not do that its "against their rules" she asked myself if we could send her home with the new dosage, I informed her that we were sending a paper prescription. Pts daughter states they use a mail order company to fill prescription and it always take "a while", she stated before she has had to go give patient her medications everyday while they send the prescription off to the mail order company and wait for the medication to arrive. Daughter states "I do it this time too".

## 2016-01-21 NOTE — ED Notes (Signed)
Patient c/o hyperglycemia. Per family member, patient was admitted in February for hypoglycemia and she was taken off her glimepiride. Patient recently seen by new physician and blood sugar noted to be increasing. Per family member blood sugar 576 today.

## 2016-01-21 NOTE — ED Notes (Signed)
Pt up to restroom.

## 2016-01-22 LAB — POCT INR: INR: 2

## 2016-01-24 ENCOUNTER — Ambulatory Visit: Payer: Self-pay | Admitting: Pharmacist

## 2016-01-24 DIAGNOSIS — I2699 Other pulmonary embolism without acute cor pulmonale: Secondary | ICD-10-CM

## 2016-01-24 DIAGNOSIS — Z7901 Long term (current) use of anticoagulants: Secondary | ICD-10-CM

## 2016-01-24 DIAGNOSIS — I482 Chronic atrial fibrillation, unspecified: Secondary | ICD-10-CM

## 2016-01-24 MED ORDER — WARFARIN SODIUM 2 MG PO TABS: ORAL_TABLET | ORAL | Status: DC

## 2016-01-31 ENCOUNTER — Encounter (HOSPITAL_COMMUNITY)
Admission: RE | Admit: 2016-01-31 | Discharge: 2016-01-31 | Disposition: A | Discharge status: Home / Self Care | Payer: Medicare Other | Source: Ambulatory Visit | Attending: Nephrology | Admitting: Nephrology

## 2016-01-31 DIAGNOSIS — N184 Chronic kidney disease, stage 4 (severe): Secondary | ICD-10-CM | POA: Diagnosis not present

## 2016-01-31 DIAGNOSIS — D638 Anemia in other chronic diseases classified elsewhere: Secondary | ICD-10-CM | POA: Insufficient documentation

## 2016-01-31 LAB — IRON AND TIBC
Iron: 85 ug/dL (ref 28–170)
Saturation Ratios: 25 % (ref 10.4–31.8)
TIBC: 344 ug/dL (ref 250–450)
UIBC: 259 ug/dL

## 2016-01-31 LAB — FERRITIN: FERRITIN: 16 ng/mL (ref 11–307)

## 2016-01-31 LAB — HEMOGLOBIN AND HEMATOCRIT, BLOOD
HEMATOCRIT: 39.9 % (ref 36.0–46.0)
HEMOGLOBIN: 12.8 g/dL (ref 12.0–15.0)

## 2016-02-01 NOTE — Progress Notes (Signed)
Results for Julie Carpenter, Julie Carpenter (MRN [id number]) as of 02/01/2016 09:29  Ref. Range 01/31/2016 15:35  Hemoglobin Latest Ref Range: 12.0-15.0 g/dL 12.8  HCT Latest Ref Range: 36.0-46.0 % 39.9  Results for Julie Carpenter, Julie Carpenter (MRN [id number]) as of 02/01/2016 09:29  Ref. Range 01/31/2016 15:36  Iron Latest Ref Range: 28-170 ug/dL 85  UIBC Latest Units: ug/dL 259  TIBC Latest Ref Range: 250-450 ug/dL 344  Saturation Ratios Latest Ref Range: 10.4-31.8 % 25  Ferritin Latest Ref Range: 11-307 ng/mL 16

## 2016-02-05 LAB — POCT INR: INR: 2.7

## 2016-02-06 ENCOUNTER — Telehealth: Payer: Self-pay | Admitting: Pharmacist Clinician (PhC)/ Clinical Pharmacy Specialist

## 2016-02-06 ENCOUNTER — Ambulatory Visit: Payer: Self-pay | Admitting: Pharmacist Clinician (PhC)/ Clinical Pharmacy Specialist

## 2016-02-06 DIAGNOSIS — I2699 Other pulmonary embolism without acute cor pulmonale: Secondary | ICD-10-CM

## 2016-02-06 DIAGNOSIS — I482 Chronic atrial fibrillation, unspecified: Secondary | ICD-10-CM

## 2016-02-06 DIAGNOSIS — Z7901 Long term (current) use of anticoagulants: Secondary | ICD-10-CM

## 2016-02-06 NOTE — Telephone Encounter (Signed)
Called Julie Carpenter's nurse in assisted living with INR result of 2.7 and instructed her to have patient continue taking warfarin the same way and re-check INR in 1 week.  Nurse requested faxed order be sent over for next check.

## 2016-02-12 LAB — POCT INR: INR: 2.5

## 2016-02-13 ENCOUNTER — Encounter: Payer: Medicare Other | Admitting: Pharmacist

## 2016-02-13 ENCOUNTER — Ambulatory Visit (INDEPENDENT_AMBULATORY_CARE_PROVIDER_SITE_OTHER): Payer: Self-pay | Admitting: Pharmacist

## 2016-02-13 DIAGNOSIS — I482 Chronic atrial fibrillation, unspecified: Secondary | ICD-10-CM

## 2016-02-13 DIAGNOSIS — I2699 Other pulmonary embolism without acute cor pulmonale: Secondary | ICD-10-CM

## 2016-02-13 DIAGNOSIS — Z7901 Long term (current) use of anticoagulants: Secondary | ICD-10-CM

## 2016-02-13 NOTE — Progress Notes (Signed)
Subjective:     Indication: atrial fibrillation Bleeding signs/symptoms: None Thromboembolic signs/symptoms: None  Missed Coumadin doses: None Medication changes: no Dietary changes: no Bacterial/viral infection: no Other concerns: no    Objective:    INR Today: 2.5 Current dose: 3mg  qd except 2mg  on Thrusdays  Assessment:    Therapeutic INR for goal of 2-3   Plan:    1. New dose: no change   2. Next INR: 1 week     Patient checks INR at home with Home INR monitoring.  Billing once per month interupertation fee.  Patient diagnosis - chronic atrial fibrillation Procedure code if G0250

## 2016-02-19 LAB — POCT INR: INR: 2

## 2016-02-20 ENCOUNTER — Ambulatory Visit (INDEPENDENT_AMBULATORY_CARE_PROVIDER_SITE_OTHER): Payer: Medicare Other | Admitting: Pharmacist

## 2016-02-20 DIAGNOSIS — I2699 Other pulmonary embolism without acute cor pulmonale: Secondary | ICD-10-CM

## 2016-02-20 DIAGNOSIS — Z7901 Long term (current) use of anticoagulants: Secondary | ICD-10-CM

## 2016-02-20 DIAGNOSIS — I482 Chronic atrial fibrillation, unspecified: Secondary | ICD-10-CM

## 2016-02-20 NOTE — Progress Notes (Signed)
No charge for labs or visit - INR check through home monitoring system  

## 2016-02-23 ENCOUNTER — Telehealth: Payer: Self-pay | Admitting: Emergency Medicine

## 2016-02-23 ENCOUNTER — Ambulatory Visit: Payer: Self-pay | Admitting: Pharmacist

## 2016-02-23 DIAGNOSIS — Z7901 Long term (current) use of anticoagulants: Secondary | ICD-10-CM

## 2016-02-23 DIAGNOSIS — I482 Chronic atrial fibrillation, unspecified: Secondary | ICD-10-CM

## 2016-02-23 DIAGNOSIS — I2699 Other pulmonary embolism without acute cor pulmonale: Secondary | ICD-10-CM

## 2016-02-23 NOTE — Telephone Encounter (Signed)
Noted and patient removed from our anticoagulation clinic list

## 2016-02-26 LAB — POCT INR: INR: 2.3

## 2016-02-27 ENCOUNTER — Ambulatory Visit: Payer: Self-pay | Admitting: Pharmacist

## 2016-02-28 ENCOUNTER — Encounter (HOSPITAL_COMMUNITY)
Admission: RE | Admit: 2016-02-28 | Discharge: 2016-02-28 | Disposition: A | Discharge status: Home / Self Care | Payer: Medicare Other | Source: Ambulatory Visit | Attending: Nephrology | Admitting: Nephrology

## 2016-02-28 DIAGNOSIS — D638 Anemia in other chronic diseases classified elsewhere: Secondary | ICD-10-CM | POA: Insufficient documentation

## 2016-02-28 DIAGNOSIS — N184 Chronic kidney disease, stage 4 (severe): Secondary | ICD-10-CM | POA: Insufficient documentation

## 2016-02-28 LAB — HEMOGLOBIN AND HEMATOCRIT, BLOOD
HCT: 38.9 % (ref 36.0–46.0)
HEMOGLOBIN: 12.5 g/dL (ref 12.0–15.0)

## 2016-02-29 LAB — IRON AND TIBC
IRON: 60 ug/dL (ref 28–170)
SATURATION RATIOS: 18 % (ref 10.4–31.8)
TIBC: 335 ug/dL (ref 250–450)
UIBC: 275 ug/dL

## 2016-02-29 LAB — FERRITIN: FERRITIN: 20 ng/mL (ref 11–307)

## 2016-02-29 NOTE — Progress Notes (Signed)
Results for Julie Carpenter, Julie Carpenter (MRN [id number]) as of 02/29/2016 07:22  Ref. Range 02/28/2016 14:07  Iron Latest Ref Range: 28-170 ug/dL 60  UIBC Latest Units: ug/dL 275  TIBC Latest Ref Range: 250-450 ug/dL 335  Saturation Ratios Latest Ref Range: 10.4-31.8 % 18  Ferritin Latest Ref Range: 11-307 ng/mL 20  Hemoglobin Latest Ref Range: 12.0-15.0 g/dL 12.5  HCT Latest Ref Range: 36.0-46.0 % 38.9

## 2016-02-29 NOTE — Progress Notes (Signed)
Called Dr Mattingly's office and left voice mail with [place]. Lab was not able to perform Ferritin level due to insufficient specimen. Called to see if patient can have level drawn with next appointment or return for additional lab draw.

## 2016-03-05 LAB — POCT INR: INR: 2.5

## 2016-03-13 LAB — POCT INR: INR: 2.5

## 2016-03-15 ENCOUNTER — Ambulatory Visit: Payer: Medicare Other | Admitting: Pediatrics

## 2016-03-22 ENCOUNTER — Encounter: Payer: Self-pay | Admitting: Nurse Practitioner

## 2016-03-27 ENCOUNTER — Ambulatory Visit (HOSPITAL_COMMUNITY): Payer: Self-pay

## 2016-03-27 ENCOUNTER — Other Ambulatory Visit (HOSPITAL_COMMUNITY): Payer: Self-pay

## 2016-04-03 ENCOUNTER — Encounter (HOSPITAL_COMMUNITY)
Admission: RE | Admit: 2016-04-03 | Discharge: 2016-04-03 | Disposition: A | Discharge status: Home / Self Care | Payer: Medicare Other | Source: Ambulatory Visit | Attending: Nephrology | Admitting: Nephrology

## 2016-04-03 ENCOUNTER — Encounter (HOSPITAL_COMMUNITY): Payer: Self-pay

## 2016-04-03 DIAGNOSIS — D638 Anemia in other chronic diseases classified elsewhere: Secondary | ICD-10-CM | POA: Diagnosis not present

## 2016-04-03 DIAGNOSIS — N184 Chronic kidney disease, stage 4 (severe): Secondary | ICD-10-CM | POA: Insufficient documentation

## 2016-04-03 LAB — IRON AND TIBC
Iron: 80 ug/dL (ref 28–170)
SATURATION RATIOS: 26 % (ref 10.4–31.8)
TIBC: 312 ug/dL (ref 250–450)
UIBC: 232 ug/dL

## 2016-04-03 LAB — HEMOGLOBIN AND HEMATOCRIT, BLOOD
HEMATOCRIT: 40.7 % (ref 36.0–46.0)
Hemoglobin: 13.4 g/dL (ref 12.0–15.0)

## 2016-04-03 LAB — FERRITIN: Ferritin: 39 ng/mL (ref 11–307)

## 2016-04-03 MED ORDER — EPOETIN ALFA 20000 UNIT/ML IJ SOLN: 20000.0000 [IU] | Freq: Once | INTRAMUSCULAR | Status: DC

## 2016-04-04 NOTE — Progress Notes (Signed)
Results for AMIELA, RENNER (MRN [id number]) as of 04/04/2016 10:08  Ref. Range 04/03/2016 14:00  Iron Latest Ref Range: 28 - 170 ug/dL 80  UIBC Latest Units: ug/dL 232  TIBC Latest Ref Range: 250 - 450 ug/dL 312  Saturation Ratios Latest Ref Range: 10.4 - 31.8 % 26  Ferritin Latest Ref Range: 11 - 307 ng/mL 39  Hemoglobin Latest Ref Range: 12.0 - 15.0 g/dL 13.4  HCT Latest Ref Range: 36.0 - 46.0 % 40.7

## 2016-04-11 DIAGNOSIS — Z7901 Long term (current) use of anticoagulants: Secondary | ICD-10-CM | POA: Diagnosis not present

## 2016-04-15 DIAGNOSIS — Z7901 Long term (current) use of anticoagulants: Secondary | ICD-10-CM | POA: Diagnosis not present

## 2016-04-22 DIAGNOSIS — Z7901 Long term (current) use of anticoagulants: Secondary | ICD-10-CM | POA: Diagnosis not present

## 2016-04-25 DIAGNOSIS — Z7901 Long term (current) use of anticoagulants: Secondary | ICD-10-CM | POA: Diagnosis not present

## 2016-05-01 DIAGNOSIS — Z7901 Long term (current) use of anticoagulants: Secondary | ICD-10-CM | POA: Diagnosis not present

## 2016-05-07 DIAGNOSIS — I482 Chronic atrial fibrillation: Secondary | ICD-10-CM | POA: Diagnosis not present

## 2016-05-07 DIAGNOSIS — Z7901 Long term (current) use of anticoagulants: Secondary | ICD-10-CM | POA: Diagnosis not present

## 2016-05-08 ENCOUNTER — Other Ambulatory Visit (HOSPITAL_COMMUNITY): Payer: Self-pay

## 2016-05-08 ENCOUNTER — Encounter (HOSPITAL_COMMUNITY)
Admission: RE | Admit: 2016-05-08 | Discharge: 2016-05-08 | Disposition: A | Discharge status: Home / Self Care | Payer: Medicare Other | Source: Ambulatory Visit | Attending: Nephrology | Admitting: Nephrology

## 2016-05-08 ENCOUNTER — Ambulatory Visit (HOSPITAL_COMMUNITY): Payer: Self-pay

## 2016-05-08 DIAGNOSIS — N184 Chronic kidney disease, stage 4 (severe): Secondary | ICD-10-CM | POA: Insufficient documentation

## 2016-05-08 DIAGNOSIS — D638 Anemia in other chronic diseases classified elsewhere: Secondary | ICD-10-CM | POA: Insufficient documentation

## 2016-05-08 LAB — IRON AND TIBC
IRON: 46 ug/dL (ref 28–170)
SATURATION RATIOS: 15 % (ref 10.4–31.8)
TIBC: 309 ug/dL (ref 250–450)
UIBC: 263 ug/dL

## 2016-05-08 LAB — HEMOGLOBIN AND HEMATOCRIT, BLOOD
HCT: 38.9 % (ref 36.0–46.0)
Hemoglobin: 12.7 g/dL (ref 12.0–15.0)

## 2016-05-08 LAB — FERRITIN: Ferritin: 22 ng/mL (ref 11–307)

## 2016-05-08 MED ORDER — EPOETIN ALFA 20000 UNIT/ML IJ SOLN: 20000.0000 [IU] | Freq: Once | INTRAMUSCULAR | Status: DC

## 2016-05-08 NOTE — Progress Notes (Addendum)
Results for Julie Carpenter, Julie Carpenter (MRN [id number]) as of 05/08/2016 15:44  Ref. Range 05/08/2016 12:55  Hemoglobin Latest Ref Range: 12.0 - 15.0 g/dL 12.7  HCT Latest Ref Range: 36.0 - 46.0 % 38.9    No injection indicated per MD order parameters.

## 2016-05-22 ENCOUNTER — Encounter (HOSPITAL_COMMUNITY): Admission: RE | Admit: 2016-05-22 | Payer: Medicare Other | Source: Ambulatory Visit

## 2016-05-22 ENCOUNTER — Other Ambulatory Visit (HOSPITAL_COMMUNITY): Payer: Self-pay

## 2016-06-03 DIAGNOSIS — Z7901 Long term (current) use of anticoagulants: Secondary | ICD-10-CM | POA: Diagnosis not present

## 2016-06-03 DIAGNOSIS — I482 Chronic atrial fibrillation: Secondary | ICD-10-CM | POA: Diagnosis not present

## 2016-06-05 DIAGNOSIS — Z7901 Long term (current) use of anticoagulants: Secondary | ICD-10-CM | POA: Diagnosis not present

## 2016-06-05 DIAGNOSIS — Z23 Encounter for immunization: Secondary | ICD-10-CM | POA: Diagnosis not present

## 2016-06-24 DIAGNOSIS — E119 Type 2 diabetes mellitus without complications: Secondary | ICD-10-CM | POA: Diagnosis not present

## 2016-07-01 DIAGNOSIS — I482 Chronic atrial fibrillation: Secondary | ICD-10-CM | POA: Diagnosis not present

## 2016-07-01 DIAGNOSIS — Z7901 Long term (current) use of anticoagulants: Secondary | ICD-10-CM | POA: Diagnosis not present

## 2016-07-15 DIAGNOSIS — N189 Chronic kidney disease, unspecified: Secondary | ICD-10-CM | POA: Diagnosis not present

## 2016-07-22 DIAGNOSIS — I10 Essential (primary) hypertension: Secondary | ICD-10-CM | POA: Diagnosis not present

## 2016-07-22 DIAGNOSIS — I482 Chronic atrial fibrillation: Secondary | ICD-10-CM | POA: Diagnosis not present

## 2016-07-22 DIAGNOSIS — E119 Type 2 diabetes mellitus without complications: Secondary | ICD-10-CM | POA: Diagnosis not present

## 2016-07-22 DIAGNOSIS — Z7901 Long term (current) use of anticoagulants: Secondary | ICD-10-CM | POA: Diagnosis not present

## 2016-07-22 DIAGNOSIS — N189 Chronic kidney disease, unspecified: Secondary | ICD-10-CM | POA: Diagnosis not present

## 2016-07-30 DIAGNOSIS — Z7901 Long term (current) use of anticoagulants: Secondary | ICD-10-CM | POA: Diagnosis not present

## 2016-07-30 DIAGNOSIS — I482 Chronic atrial fibrillation: Secondary | ICD-10-CM | POA: Diagnosis not present

## 2016-08-05 DIAGNOSIS — Z7901 Long term (current) use of anticoagulants: Secondary | ICD-10-CM | POA: Diagnosis not present

## 2016-08-05 DIAGNOSIS — N184 Chronic kidney disease, stage 4 (severe): Secondary | ICD-10-CM | POA: Diagnosis not present

## 2016-08-26 DIAGNOSIS — I482 Chronic atrial fibrillation: Secondary | ICD-10-CM | POA: Diagnosis not present

## 2016-08-26 DIAGNOSIS — Z7901 Long term (current) use of anticoagulants: Secondary | ICD-10-CM | POA: Diagnosis not present

## 2016-08-27 DIAGNOSIS — E872 Acidosis: Secondary | ICD-10-CM | POA: Diagnosis not present

## 2016-08-27 DIAGNOSIS — N184 Chronic kidney disease, stage 4 (severe): Secondary | ICD-10-CM | POA: Diagnosis not present

## 2016-08-27 DIAGNOSIS — E1122 Type 2 diabetes mellitus with diabetic chronic kidney disease: Secondary | ICD-10-CM | POA: Diagnosis not present

## 2016-08-27 DIAGNOSIS — N2581 Secondary hyperparathyroidism of renal origin: Secondary | ICD-10-CM | POA: Diagnosis not present

## 2016-08-27 DIAGNOSIS — I129 Hypertensive chronic kidney disease with stage 1 through stage 4 chronic kidney disease, or unspecified chronic kidney disease: Secondary | ICD-10-CM | POA: Diagnosis not present

## 2016-09-16 DIAGNOSIS — I482 Chronic atrial fibrillation: Secondary | ICD-10-CM | POA: Diagnosis not present

## 2016-09-16 DIAGNOSIS — N189 Chronic kidney disease, unspecified: Secondary | ICD-10-CM | POA: Diagnosis not present

## 2016-09-24 DIAGNOSIS — Z7901 Long term (current) use of anticoagulants: Secondary | ICD-10-CM | POA: Diagnosis not present

## 2016-09-24 DIAGNOSIS — I482 Chronic atrial fibrillation: Secondary | ICD-10-CM | POA: Diagnosis not present

## 2016-10-22 DIAGNOSIS — Z7901 Long term (current) use of anticoagulants: Secondary | ICD-10-CM | POA: Diagnosis not present

## 2016-10-22 DIAGNOSIS — D638 Anemia in other chronic diseases classified elsewhere: Secondary | ICD-10-CM | POA: Diagnosis not present

## 2016-10-22 DIAGNOSIS — N2581 Secondary hyperparathyroidism of renal origin: Secondary | ICD-10-CM | POA: Diagnosis not present

## 2016-10-22 DIAGNOSIS — N184 Chronic kidney disease, stage 4 (severe): Secondary | ICD-10-CM | POA: Diagnosis not present

## 2016-10-22 DIAGNOSIS — E1122 Type 2 diabetes mellitus with diabetic chronic kidney disease: Secondary | ICD-10-CM | POA: Diagnosis not present

## 2016-10-22 DIAGNOSIS — I482 Chronic atrial fibrillation: Secondary | ICD-10-CM | POA: Diagnosis not present

## 2016-10-22 DIAGNOSIS — D631 Anemia in chronic kidney disease: Secondary | ICD-10-CM | POA: Diagnosis not present

## 2016-10-22 DIAGNOSIS — I129 Hypertensive chronic kidney disease with stage 1 through stage 4 chronic kidney disease, or unspecified chronic kidney disease: Secondary | ICD-10-CM | POA: Diagnosis not present

## 2016-11-19 DIAGNOSIS — I482 Chronic atrial fibrillation: Secondary | ICD-10-CM | POA: Diagnosis not present

## 2016-11-19 DIAGNOSIS — Z7901 Long term (current) use of anticoagulants: Secondary | ICD-10-CM | POA: Diagnosis not present

## 2016-12-16 DIAGNOSIS — Z7901 Long term (current) use of anticoagulants: Secondary | ICD-10-CM | POA: Diagnosis not present

## 2016-12-16 DIAGNOSIS — I482 Chronic atrial fibrillation: Secondary | ICD-10-CM | POA: Diagnosis not present

## 2017-01-13 DIAGNOSIS — Z7901 Long term (current) use of anticoagulants: Secondary | ICD-10-CM | POA: Diagnosis not present

## 2017-01-13 DIAGNOSIS — I482 Chronic atrial fibrillation: Secondary | ICD-10-CM | POA: Diagnosis not present

## 2017-01-27 DIAGNOSIS — Z7901 Long term (current) use of anticoagulants: Secondary | ICD-10-CM | POA: Diagnosis not present

## 2017-01-27 DIAGNOSIS — I10 Essential (primary) hypertension: Secondary | ICD-10-CM | POA: Diagnosis not present

## 2017-01-27 DIAGNOSIS — N184 Chronic kidney disease, stage 4 (severe): Secondary | ICD-10-CM | POA: Diagnosis not present

## 2017-01-27 DIAGNOSIS — E119 Type 2 diabetes mellitus without complications: Secondary | ICD-10-CM | POA: Diagnosis not present

## 2017-01-27 DIAGNOSIS — I482 Chronic atrial fibrillation: Secondary | ICD-10-CM | POA: Diagnosis not present

## 2017-01-27 DIAGNOSIS — D649 Anemia, unspecified: Secondary | ICD-10-CM | POA: Diagnosis not present

## 2017-02-04 DIAGNOSIS — E872 Acidosis: Secondary | ICD-10-CM | POA: Diagnosis not present

## 2017-02-04 DIAGNOSIS — N2581 Secondary hyperparathyroidism of renal origin: Secondary | ICD-10-CM | POA: Diagnosis not present

## 2017-02-04 DIAGNOSIS — N184 Chronic kidney disease, stage 4 (severe): Secondary | ICD-10-CM | POA: Diagnosis not present

## 2017-02-04 DIAGNOSIS — I129 Hypertensive chronic kidney disease with stage 1 through stage 4 chronic kidney disease, or unspecified chronic kidney disease: Secondary | ICD-10-CM | POA: Diagnosis not present

## 2017-02-04 DIAGNOSIS — E1122 Type 2 diabetes mellitus with diabetic chronic kidney disease: Secondary | ICD-10-CM | POA: Diagnosis not present

## 2017-02-10 DIAGNOSIS — Z7901 Long term (current) use of anticoagulants: Secondary | ICD-10-CM | POA: Diagnosis not present

## 2017-02-10 DIAGNOSIS — I482 Chronic atrial fibrillation: Secondary | ICD-10-CM | POA: Diagnosis not present

## 2017-03-10 DIAGNOSIS — I482 Chronic atrial fibrillation: Secondary | ICD-10-CM | POA: Diagnosis not present

## 2017-03-10 DIAGNOSIS — Z7901 Long term (current) use of anticoagulants: Secondary | ICD-10-CM | POA: Diagnosis not present

## 2017-04-07 DIAGNOSIS — Z7901 Long term (current) use of anticoagulants: Secondary | ICD-10-CM | POA: Diagnosis not present

## 2017-04-07 DIAGNOSIS — I482 Chronic atrial fibrillation: Secondary | ICD-10-CM | POA: Diagnosis not present

## 2017-05-01 DIAGNOSIS — D631 Anemia in chronic kidney disease: Secondary | ICD-10-CM | POA: Diagnosis not present

## 2017-05-01 DIAGNOSIS — E559 Vitamin D deficiency, unspecified: Secondary | ICD-10-CM | POA: Diagnosis not present

## 2017-05-01 DIAGNOSIS — E872 Acidosis: Secondary | ICD-10-CM | POA: Diagnosis not present

## 2017-05-01 DIAGNOSIS — E1122 Type 2 diabetes mellitus with diabetic chronic kidney disease: Secondary | ICD-10-CM | POA: Diagnosis not present

## 2017-05-01 DIAGNOSIS — I129 Hypertensive chronic kidney disease with stage 1 through stage 4 chronic kidney disease, or unspecified chronic kidney disease: Secondary | ICD-10-CM | POA: Diagnosis not present

## 2017-05-01 DIAGNOSIS — R809 Proteinuria, unspecified: Secondary | ICD-10-CM | POA: Diagnosis not present

## 2017-05-01 DIAGNOSIS — N184 Chronic kidney disease, stage 4 (severe): Secondary | ICD-10-CM | POA: Diagnosis not present

## 2017-05-01 DIAGNOSIS — N2581 Secondary hyperparathyroidism of renal origin: Secondary | ICD-10-CM | POA: Diagnosis not present

## 2017-05-05 DIAGNOSIS — I482 Chronic atrial fibrillation: Secondary | ICD-10-CM | POA: Diagnosis not present

## 2017-05-05 DIAGNOSIS — Z7901 Long term (current) use of anticoagulants: Secondary | ICD-10-CM | POA: Diagnosis not present

## 2017-06-02 DIAGNOSIS — Z7901 Long term (current) use of anticoagulants: Secondary | ICD-10-CM | POA: Diagnosis not present

## 2017-06-02 DIAGNOSIS — I482 Chronic atrial fibrillation: Secondary | ICD-10-CM | POA: Diagnosis not present

## 2017-07-04 DIAGNOSIS — N390 Urinary tract infection, site not specified: Secondary | ICD-10-CM | POA: Diagnosis not present

## 2017-07-29 DIAGNOSIS — N184 Chronic kidney disease, stage 4 (severe): Secondary | ICD-10-CM | POA: Diagnosis not present

## 2017-07-29 DIAGNOSIS — E872 Acidosis: Secondary | ICD-10-CM | POA: Diagnosis not present

## 2017-07-29 DIAGNOSIS — I129 Hypertensive chronic kidney disease with stage 1 through stage 4 chronic kidney disease, or unspecified chronic kidney disease: Secondary | ICD-10-CM | POA: Diagnosis not present

## 2017-07-29 DIAGNOSIS — E1122 Type 2 diabetes mellitus with diabetic chronic kidney disease: Secondary | ICD-10-CM | POA: Diagnosis not present

## 2017-07-29 DIAGNOSIS — E559 Vitamin D deficiency, unspecified: Secondary | ICD-10-CM | POA: Diagnosis not present

## 2017-07-29 DIAGNOSIS — N2581 Secondary hyperparathyroidism of renal origin: Secondary | ICD-10-CM | POA: Diagnosis not present

## 2017-07-31 DIAGNOSIS — I482 Chronic atrial fibrillation: Secondary | ICD-10-CM | POA: Diagnosis not present

## 2017-07-31 DIAGNOSIS — Z7901 Long term (current) use of anticoagulants: Secondary | ICD-10-CM | POA: Diagnosis not present

## 2017-08-25 DIAGNOSIS — I482 Chronic atrial fibrillation: Secondary | ICD-10-CM | POA: Diagnosis not present

## 2017-08-25 DIAGNOSIS — Z7901 Long term (current) use of anticoagulants: Secondary | ICD-10-CM | POA: Diagnosis not present

## 2017-09-22 DIAGNOSIS — I482 Chronic atrial fibrillation: Secondary | ICD-10-CM | POA: Diagnosis not present

## 2017-09-22 DIAGNOSIS — Z7901 Long term (current) use of anticoagulants: Secondary | ICD-10-CM | POA: Diagnosis not present

## 2017-10-14 DIAGNOSIS — E875 Hyperkalemia: Secondary | ICD-10-CM | POA: Diagnosis not present

## 2017-10-14 DIAGNOSIS — E872 Acidosis: Secondary | ICD-10-CM | POA: Diagnosis not present

## 2017-10-14 DIAGNOSIS — E1122 Type 2 diabetes mellitus with diabetic chronic kidney disease: Secondary | ICD-10-CM | POA: Diagnosis not present

## 2017-10-14 DIAGNOSIS — D631 Anemia in chronic kidney disease: Secondary | ICD-10-CM | POA: Diagnosis not present

## 2017-10-14 DIAGNOSIS — R809 Proteinuria, unspecified: Secondary | ICD-10-CM | POA: Diagnosis not present

## 2017-10-14 DIAGNOSIS — N184 Chronic kidney disease, stage 4 (severe): Secondary | ICD-10-CM | POA: Diagnosis not present

## 2017-10-14 DIAGNOSIS — I129 Hypertensive chronic kidney disease with stage 1 through stage 4 chronic kidney disease, or unspecified chronic kidney disease: Secondary | ICD-10-CM | POA: Diagnosis not present

## 2017-10-14 DIAGNOSIS — N2581 Secondary hyperparathyroidism of renal origin: Secondary | ICD-10-CM | POA: Diagnosis not present

## 2017-10-21 DIAGNOSIS — I482 Chronic atrial fibrillation: Secondary | ICD-10-CM | POA: Diagnosis not present

## 2017-10-21 DIAGNOSIS — Z7901 Long term (current) use of anticoagulants: Secondary | ICD-10-CM | POA: Diagnosis not present

## 2017-11-17 DIAGNOSIS — I482 Chronic atrial fibrillation: Secondary | ICD-10-CM | POA: Diagnosis not present

## 2017-11-17 DIAGNOSIS — Z7901 Long term (current) use of anticoagulants: Secondary | ICD-10-CM | POA: Diagnosis not present

## 2017-11-26 DIAGNOSIS — Z7901 Long term (current) use of anticoagulants: Secondary | ICD-10-CM | POA: Diagnosis not present

## 2017-11-26 DIAGNOSIS — Z13220 Encounter for screening for lipoid disorders: Secondary | ICD-10-CM | POA: Diagnosis not present

## 2017-11-26 DIAGNOSIS — E119 Type 2 diabetes mellitus without complications: Secondary | ICD-10-CM | POA: Diagnosis not present

## 2017-11-26 DIAGNOSIS — E559 Vitamin D deficiency, unspecified: Secondary | ICD-10-CM | POA: Diagnosis not present

## 2017-11-26 DIAGNOSIS — L84 Corns and callosities: Secondary | ICD-10-CM | POA: Diagnosis not present

## 2017-11-26 DIAGNOSIS — D649 Anemia, unspecified: Secondary | ICD-10-CM | POA: Diagnosis not present

## 2017-11-26 DIAGNOSIS — E782 Mixed hyperlipidemia: Secondary | ICD-10-CM | POA: Diagnosis not present

## 2017-11-26 DIAGNOSIS — I10 Essential (primary) hypertension: Secondary | ICD-10-CM | POA: Diagnosis not present

## 2017-11-26 DIAGNOSIS — I482 Chronic atrial fibrillation: Secondary | ICD-10-CM | POA: Diagnosis not present

## 2017-11-26 DIAGNOSIS — B351 Tinea unguium: Secondary | ICD-10-CM | POA: Diagnosis not present

## 2017-11-26 DIAGNOSIS — N184 Chronic kidney disease, stage 4 (severe): Secondary | ICD-10-CM | POA: Diagnosis not present

## 2017-12-02 ENCOUNTER — Encounter (HOSPITAL_COMMUNITY): Payer: Self-pay | Admitting: *Deleted

## 2017-12-02 ENCOUNTER — Emergency Department (HOSPITAL_COMMUNITY)
Admission: EM | Admit: 2017-12-02 | Discharge: 2017-12-02 | Disposition: A | Discharge status: Home / Self Care | Payer: Medicare Other | Attending: Emergency Medicine | Admitting: Emergency Medicine

## 2017-12-02 ENCOUNTER — Other Ambulatory Visit: Payer: Self-pay

## 2017-12-02 DIAGNOSIS — N3000 Acute cystitis without hematuria: Secondary | ICD-10-CM | POA: Diagnosis not present

## 2017-12-02 DIAGNOSIS — I4891 Unspecified atrial fibrillation: Secondary | ICD-10-CM | POA: Diagnosis not present

## 2017-12-02 DIAGNOSIS — Z7901 Long term (current) use of anticoagulants: Secondary | ICD-10-CM | POA: Diagnosis not present

## 2017-12-02 DIAGNOSIS — E1165 Type 2 diabetes mellitus with hyperglycemia: Secondary | ICD-10-CM | POA: Insufficient documentation

## 2017-12-02 DIAGNOSIS — R402441 Other coma, without documented Glasgow coma scale score, or with partial score reported, in the field [EMT or ambulance]: Secondary | ICD-10-CM | POA: Diagnosis not present

## 2017-12-02 DIAGNOSIS — Z79899 Other long term (current) drug therapy: Secondary | ICD-10-CM | POA: Diagnosis not present

## 2017-12-02 DIAGNOSIS — N3090 Cystitis, unspecified without hematuria: Secondary | ICD-10-CM | POA: Insufficient documentation

## 2017-12-02 DIAGNOSIS — N184 Chronic kidney disease, stage 4 (severe): Secondary | ICD-10-CM | POA: Diagnosis not present

## 2017-12-02 DIAGNOSIS — R739 Hyperglycemia, unspecified: Secondary | ICD-10-CM

## 2017-12-02 DIAGNOSIS — I129 Hypertensive chronic kidney disease with stage 1 through stage 4 chronic kidney disease, or unspecified chronic kidney disease: Secondary | ICD-10-CM | POA: Diagnosis not present

## 2017-12-02 DIAGNOSIS — F0390 Unspecified dementia without behavioral disturbance: Secondary | ICD-10-CM | POA: Diagnosis not present

## 2017-12-02 HISTORY — DX: Anemia, unspecified: D64.9

## 2017-12-02 HISTORY — DX: Chronic kidney disease, unspecified: N18.9

## 2017-12-02 LAB — COMPREHENSIVE METABOLIC PANEL
ALBUMIN: 3.3 g/dL — AB (ref 3.5–5.0)
ALT: 12 U/L — ABNORMAL LOW (ref 14–54)
AST: 18 U/L (ref 15–41)
Alkaline Phosphatase: 104 U/L (ref 38–126)
Anion gap: 11 (ref 5–15)
BUN: 30 mg/dL — AB (ref 6–20)
CHLORIDE: 99 mmol/L — AB (ref 101–111)
CO2: 24 mmol/L (ref 22–32)
Calcium: 9.6 mg/dL (ref 8.9–10.3)
Creatinine, Ser: 1.88 mg/dL — ABNORMAL HIGH (ref 0.44–1.00)
GFR calc Af Amer: 26 mL/min — ABNORMAL LOW (ref >60)
GFR, EST NON AFRICAN AMERICAN: 23 mL/min — AB (ref >60)
GLUCOSE: 408 mg/dL — AB (ref 65–99)
POTASSIUM: 3.6 mmol/L (ref 3.5–5.1)
SODIUM: 134 mmol/L — AB (ref 135–145)
Total Bilirubin: 1.1 mg/dL (ref 0.3–1.2)
Total Protein: 7.1 g/dL (ref 6.5–8.1)

## 2017-12-02 LAB — PROTIME-INR
INR: 2.33
Prothrombin Time: 25.4 seconds — ABNORMAL HIGH (ref 11.4–15.2)

## 2017-12-02 LAB — CBC WITH DIFFERENTIAL/PLATELET
Basophils Absolute: 0 10*3/uL (ref 0.0–0.1)
Basophils Relative: 1 %
EOS PCT: 3 %
Eosinophils Absolute: 0.2 10*3/uL (ref 0.0–0.7)
HCT: 36.6 % (ref 36.0–46.0)
Hemoglobin: 11.5 g/dL — ABNORMAL LOW (ref 12.0–15.0)
LYMPHS ABS: 1.1 10*3/uL (ref 0.7–4.0)
LYMPHS PCT: 18 %
MCH: 26.1 pg (ref 26.0–34.0)
MCHC: 31.4 g/dL (ref 30.0–36.0)
MCV: 83.2 fL (ref 78.0–100.0)
MONOS PCT: 13 %
Monocytes Absolute: 0.7 10*3/uL (ref 0.1–1.0)
Neutro Abs: 3.8 10*3/uL (ref 1.7–7.7)
Neutrophils Relative %: 65 %
PLATELETS: 229 10*3/uL (ref 150–400)
RBC: 4.4 MIL/uL (ref 3.87–5.11)
RDW: 15.2 % (ref 11.5–15.5)
WBC: 5.8 10*3/uL (ref 4.0–10.5)

## 2017-12-02 LAB — URINALYSIS, ROUTINE W REFLEX MICROSCOPIC
Bilirubin Urine: NEGATIVE
KETONES UR: NEGATIVE mg/dL
NITRITE: NEGATIVE
PROTEIN: 100 mg/dL — AB
Specific Gravity, Urine: 1.013 (ref 1.005–1.030)
pH: 7 (ref 5.0–8.0)

## 2017-12-02 LAB — CBG MONITORING, ED
GLUCOSE-CAPILLARY: 404 mg/dL — AB (ref 65–99)
Glucose-Capillary: 323 mg/dL — ABNORMAL HIGH (ref 65–99)

## 2017-12-02 MED ORDER — CIPROFLOXACIN IN D5W 400 MG/200ML IV SOLN
400.0000 mg | Freq: Once | INTRAVENOUS | Status: AC
  Administered 2017-12-02: 400 mg via INTRAVENOUS
  Filled 2017-12-02: qty 200

## 2017-12-02 MED ORDER — SODIUM CHLORIDE 0.9 % IV SOLN
INTRAVENOUS | Status: DC
  Administered 2017-12-02: 09:00:00 via INTRAVENOUS

## 2017-12-02 MED ORDER — CIPROFLOXACIN HCL 500 MG PO TABS: 500.0000 mg | ORAL_TABLET | Freq: Two times a day (BID) | ORAL | 0 refills | Status: DC

## 2017-12-02 NOTE — ED Notes (Signed)
Report given to [place] (med tech) at [facility]. PT's daughter taking pt back to facility.

## 2017-12-02 NOTE — ED Notes (Signed)
Sharyn Lull from [facility] called and stated pt was prescribed Cipro, pt is allergic to cipro and the pharmacy refuses to have it filled; spoke with Dr. Nat Christen and he gave verbal order for Septra DS BID (#14); called [place] at [phone] and gave new order

## 2017-12-02 NOTE — Discharge Instructions (Addendum)
Take the antibiotic Cipro for the urinary tract infection.  Urine culture sent and should be rechecked in 2days to make sure that the antibiotic she started on is sensitive.  Patient started on Cipro because she has a severe penicillin allergy.  Blood sugars improved here some but may require some adjustment of her blood sugar medicines.  We will not do that here today.  No evidence of diabetic ketoacidosis blood sugars in the low 300s at discharge.  Will need to be followed carefully particularly as the urinary tract infection gets better it will be interesting to see if the blood sugars improve if not will definitely need adjustment of her diabetic medications.  Patient stable for discharge back to facility.

## 2017-12-02 NOTE — ED Notes (Addendum)
Awaiting iv abx to finish before d/c pt

## 2017-12-02 NOTE — ED Triage Notes (Signed)
Pt brought in by [facility] with c/o hyperglycemia. CBG 486 for EMS. BP 160/100 manually, A. Fib on monitor, mucous membranes dry per EMS. [place] told EMS that pt's blood glucose normally runs in the 300's but since yesterday her blood glucose has been up in the 500's.

## 2017-12-02 NOTE — ED Provider Notes (Signed)
[facility] EMERGENCY DEPARTMENT Provider Note   CSN: [id number] Arrival date & time: 12/02/17  0803     History   Chief Complaint Chief Complaint  Patient presents with  . Hyperglycemia    HPI Julie Carpenter is a 84 y.o. female.  Patient brought in by [facility].  Patient from [place].  Sent in for blood sugars being high.  Her blood sugar normally runs in the 300s but the last few days running in the 500s.  Patient's daughter with her.  Patient states that the food is not policed very well and that her mother has memory deficit so she eats think she is not supposed to eat.  Her daughter noted that she did have some increased thirst and that she has been urinating a lot lately.     Past Medical History:  Diagnosis Date  . Anemia   . Atrial fibrillation ([place])   . Chronic kidney disease   . Diabetes mellitus without complication ([place])   . DVT (deep venous thrombosis) ([place])   . Hyperlipidemia   . Hypertension   . PE (pulmonary embolism)     Patient Active Problem List   Diagnosis Date Noted  . Long term current use of anticoagulant therapy 12/06/2015  . Hypoglycemia 10/27/2015  . MDD (major depressive disorder), recurrent severe, without psychosis ([place]) 10/26/2015  . Dementia with behavioral disturbance 10/26/2015  . Chronic kidney disease (CKD), stage IV (severe) ([place])   . Suicidal overdose ([place]) 10/24/2015  . Depression 10/24/2015  . Other acute pulmonary embolism without acute cor pulmonale ([place]) 08/31/2015  . Long term (current) use of anticoagulants [Z79.01] 08/31/2015  . Pulmonary embolism ([place]) 08/23/2015  . Shortness of breath 08/22/2015  . Dyspnea 08/17/2015  . DOE (dyspnea on exertion) 08/17/2015  . GERD (gastroesophageal reflux disease) 02/23/2014  . Peripheral edema 02/23/2014  . GI bleed 04/05/2013  . Atrial fibrillation ([place]) 02/24/2013  . HTN (hypertension) 12/26/2010  . Chronic kidney disease (CKD) stage G3b/A2, moderately decreased  glomerular filtration rate (GFR) between 30-44 mL/min/1.73 square meter and albuminuria creatinine ratio between 30-299 mg/g (HCC) 12/26/2010  . Hyperlipemia 12/26/2010  . Peripheral neuropathy 12/26/2010  . Anemia 12/26/2010  . Diabetes ([place]) 12/26/2010  . Vertebral fracture 12/26/2010  . Venous insufficiency 12/26/2010  . Osteoporosis 12/26/2010  . Thyromegaly 12/26/2010    Past Surgical History:  Procedure Laterality Date  . ABDOMINAL HYSTERECTOMY    . bladder tack    . CHOLECYSTECTOMY    . COLONOSCOPY N/A 05/03/2013   [id number] MOST LIKELY DUE TO ONE AVM in the ascending colon/Moderate diverticulosis hroughout the entire examined colon/Two POLYPS REMOVED/Small internal hemorrhoids  . ESOPHAGOGASTRODUODENOSCOPY N/A 04/06/2013   Procedure: ESOPHAGOGASTRODUODENOSCOPY (EGD);  Surgeon: Danie Binder, MD;  [facility];  Service: Endoscopy;  Laterality: N/A;  8:45  . GIVENS CAPSULE STUDY  04/06/2013   Procedure: GIVENS CAPSULE STUDY;  Surgeon: Danie Binder, MD;  Location: [facility];  Service: Endoscopy;;     OB History    Gravida  2   Para  2   Term  2   Preterm      AB      Living  2     SAB      TAB      Ectopic      Multiple      Live Births               Home Medications    Prior to Admission  medications   Medication Sig Start Date End Date Taking? Authorizing Provider  calcitRIOL (ROCALTROL) 0.25 MCG capsule Take 0.25 mcg by mouth 3 (three) times a week. Tuesdays,THursdays, and Saturdays 07/11/15   [provider]  carvedilol (COREG) 12.5 MG tablet Take 1 tablet (12.5 mg total) by mouth 2 (two) times daily with a meal. 08/18/15   Elgergawy, Silver Huguenin, MD  ciprofloxacin (CIPRO) 500 MG tablet Take 1 tablet (500 mg total) by mouth 2 (two) times daily. 12/02/17   Fredia Sorrow, MD  epoetin alfa (EPOGEN,PROCRIT) 2000 UNIT/ML injection Inject 2,000 Units into the vein every 21 ( twenty-one) days. Reported on 11/08/2015    [provider]  furosemide (LASIX) 40 MG tablet Take 40 mg by mouth every morning.    [provider]  glimepiride (AMARYL) 1 MG tablet Take 1 tablet (1 mg total) by mouth daily with breakfast. 01/21/16   Isla Pence, MD  senna (SENOKOT) 8.6 MG TABS tablet Take 1 tablet by mouth daily as needed for mild constipation or moderate constipation.     [provider]  sodium bicarbonate 325 MG tablet Take 325 mg by mouth 2 (two) times daily.     [provider]  warfarin (COUMADIN) 2 MG tablet Take 2mg  on thursdays.  Take 3mg  all other days. 01/24/16   Cherre Robins, PharmD    Family History Family History  Problem Relation Age of Onset  . Colon cancer Neg Hx     Social History Social History   Tobacco Use  . Smoking status: Never Smoker  . Smokeless tobacco: Never Used  Substance Use Topics  . Alcohol use: No  . Drug use: No     Allergies   Penicillins; Benicar [olmesartan medoxomil]; Ciprofloxacin; Hctz [hydrochlorothiazide]; Januvia [sitagliptin phosphate]; Norvasc [amlodipine besylate]; and Ramipril   Review of Systems Review of Systems  Unable to perform ROS: Dementia     Physical Exam Updated Vital Signs BP (!) 168/92 (BP Location: Right Arm)   Pulse 74   Temp 98.1 F (36.7 C) (Oral)   Resp 16   Ht 1.626 m (5\' 4" )   Wt 81.2 kg (179 lb)   SpO2 99%   BMI 30.73 kg/m   Physical Exam  Constitutional: She appears well-developed and well-nourished. No distress.  HENT:  Head: Normocephalic and atraumatic.  Mouth/Throat: Oropharynx is clear and moist.  Eyes: Pupils are equal, round, and reactive to light. EOM are normal.  Neck: Normal range of motion. Neck supple.  Cardiovascular: Normal rate, regular rhythm and normal heart sounds.  Pulmonary/Chest: Effort normal and breath sounds normal. No respiratory distress.  Abdominal: Soft. Bowel sounds are normal. There is no tenderness.  Musculoskeletal: Normal range of motion.  Neurological:  She is alert. No cranial nerve deficit.  Patient clearly with short-term memory deficit.  Skin: Skin is warm.  Nursing note and vitals reviewed.    ED Treatments / Results  Labs (all labs ordered are listed, but only abnormal results are displayed) Labs Reviewed  COMPREHENSIVE METABOLIC PANEL - Abnormal; Notable for the following components:      Result Value   Sodium 134 (*)    Chloride 99 (*)    Glucose, Bld 408 (*)    BUN 30 (*)    Creatinine, Ser 1.88 (*)    Albumin 3.3 (*)    ALT 12 (*)    GFR calc non Af Amer 23 (*)    GFR calc Af Amer 26 (*)    All other  components within normal limits  CBC WITH DIFFERENTIAL/PLATELET - Abnormal; Notable for the following components:   Hemoglobin 11.5 (*)    All other components within normal limits  PROTIME-INR - Abnormal; Notable for the following components:   Prothrombin Time 25.4 (*)    All other components within normal limits  URINALYSIS, ROUTINE W REFLEX MICROSCOPIC - Abnormal; Notable for the following components:   APPearance CLOUDY (*)    Glucose, UA >=500 (*)    Hgb urine dipstick SMALL (*)    Protein, ur 100 (*)    Leukocytes, UA LARGE (*)    Bacteria, UA FEW (*)    Squamous Epithelial / LPF 0-5 (*)    Non Squamous Epithelial 0-5 (*)    All other components within normal limits  CBG MONITORING, ED - Abnormal; Notable for the following components:   Glucose-Capillary 404 (*)    All other components within normal limits  CBG MONITORING, ED - Abnormal; Notable for the following components:   Glucose-Capillary 323 (*)    All other components within normal limits  URINE CULTURE    EKG EKG Interpretation  Date/Time:  Tuesday December 02 2017 09:30:20 EDT Ventricular Rate:  78 PR Interval:    QRS Duration: 88 QT Interval:  395 QTC Calculation: 450 R Axis:   57 Text Interpretation:  Atrial fibrillation Low voltage, precordial leads Confirmed by Fredia Sorrow [phone]) on 12/02/2017 2:31:01 PM   Radiology No  results found.  Procedures Procedures (including critical care time)  Medications Ordered in ED Medications  0.9 %  sodium chloride infusion ( Intravenous New Bag/Given 12/02/17 0923)  ciprofloxacin (CIPRO) IVPB 400 mg (400 mg Intravenous New Bag/Given 12/02/17 1521)     Initial Impression / Assessment and Plan / ED Course  I have reviewed the triage vital signs and the nursing notes.  Pertinent labs & imaging results that were available during my care of the patient were reviewed by me and considered in my medical decision making (see chart for details).     Patient with elevated blood sugar here in the 400 range.  No evidence of any metabolic acidosis.  No significant electrolyte abnormalities.  Patient not received any insulin here.  She is only on one oral hypoglycemic.  Workup here without any acute findings other than the finding probable of urinary tract infection.  Urine culture sent to back this up.  Patient has a severe penicillin allergy she did not want to give cephalosporins also supposedly has an allergy of Cipro but it appeared that that may just be some swelling so patient received IV Cipro without any difficulties.  Blood sugars improved here just with fluids down into the 300s.  Urine culture sent.  Will be continued on Cipro.  Blood sugars do not improve with treatment of the urinary tract infection and patient may need adjustment to her oral hypoglycemic medicines because is probably going to be extremely difficult to police her food intake.  Discussed with daughter and she is okay with plan.  Final Clinical Impressions(s) / ED Diagnoses   Final diagnoses:  Hyperglycemia  Acute cystitis without hematuria    ED Discharge Orders        Ordered    ciprofloxacin (CIPRO) 500 MG tablet  2 times daily     12/02/17 1535       Fredia Sorrow, MD 12/02/17 1639

## 2017-12-04 LAB — URINE CULTURE

## 2017-12-05 ENCOUNTER — Telehealth: Payer: Self-pay

## 2017-12-05 NOTE — Telephone Encounter (Signed)
Post ED Visit - Positive Culture Follow-up  Culture report reviewed by antimicrobial stewardship pharmacist:  []  Elenor Quinones, Pharm.D. []  Heide Guile, Pharm.D., BCPS AQ-ID []  Parks Neptune, Pharm.D., BCPS [x]  Alycia Rossetti, Pharm.D., BCPS []  [place], [place].D., BCPS, AAHIVP []  Legrand Como, Pharm.D., BCPS, AAHIVP []  Salome Arnt, PharmD, BCPS []  Jalene Mullet, PharmD []  Vincenza Hews, PharmD, BCPS  Positive urine culture Treated with Ciprofloxacin, organism sensitive to the same and no further patient follow-up is required at this time.  Genia Del 12/05/2017, 10:54 AM

## 2017-12-09 DIAGNOSIS — B351 Tinea unguium: Secondary | ICD-10-CM | POA: Diagnosis not present

## 2017-12-09 DIAGNOSIS — M79674 Pain in right toe(s): Secondary | ICD-10-CM | POA: Diagnosis not present

## 2017-12-09 DIAGNOSIS — M79675 Pain in left toe(s): Secondary | ICD-10-CM | POA: Diagnosis not present

## 2017-12-15 DIAGNOSIS — I482 Chronic atrial fibrillation: Secondary | ICD-10-CM | POA: Diagnosis not present

## 2017-12-15 DIAGNOSIS — Z7901 Long term (current) use of anticoagulants: Secondary | ICD-10-CM | POA: Diagnosis not present

## 2017-12-15 DIAGNOSIS — M79674 Pain in right toe(s): Secondary | ICD-10-CM | POA: Diagnosis not present

## 2017-12-15 DIAGNOSIS — E1151 Type 2 diabetes mellitus with diabetic peripheral angiopathy without gangrene: Secondary | ICD-10-CM | POA: Diagnosis not present

## 2017-12-15 DIAGNOSIS — B351 Tinea unguium: Secondary | ICD-10-CM | POA: Diagnosis not present

## 2017-12-15 DIAGNOSIS — M79675 Pain in left toe(s): Secondary | ICD-10-CM | POA: Diagnosis not present

## 2018-01-13 DIAGNOSIS — I482 Chronic atrial fibrillation: Secondary | ICD-10-CM | POA: Diagnosis not present

## 2018-01-13 DIAGNOSIS — Z7901 Long term (current) use of anticoagulants: Secondary | ICD-10-CM | POA: Diagnosis not present

## 2018-01-13 IMAGING — US US EXTREM LOW VENOUS*R*
1 series · 13 of 24 positions shown · non-contrast
Comparison: None.

CLINICAL DATA: History of pulmonary emboli, right lower extremity
edema for 1 week. Prior DVT.



[Series 1: us extrem low venous*right* · 0.08mm/px · 13 of 38 slices shown]
[im 1/38]
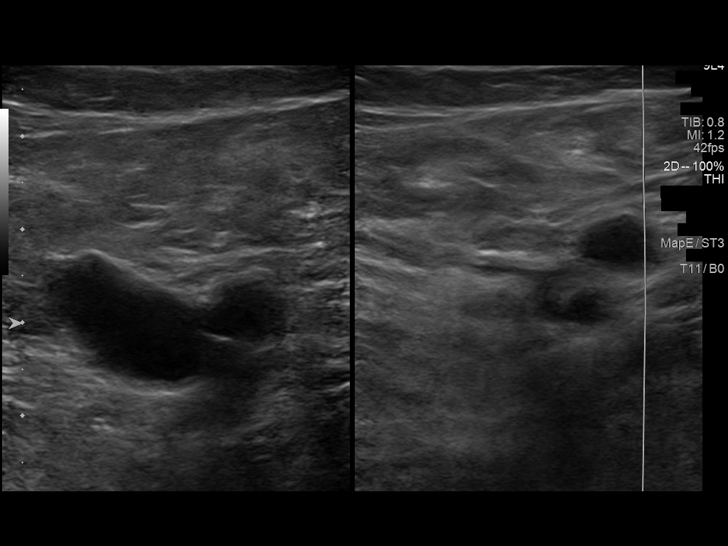
[im 4/38]
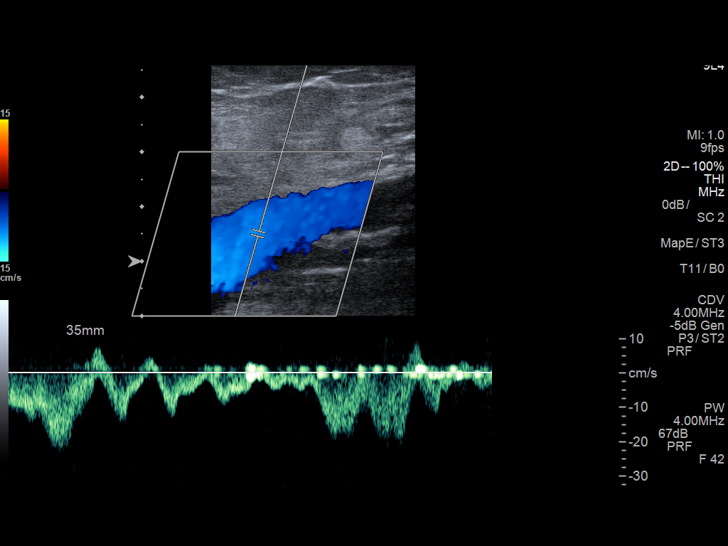
[im 7/38]
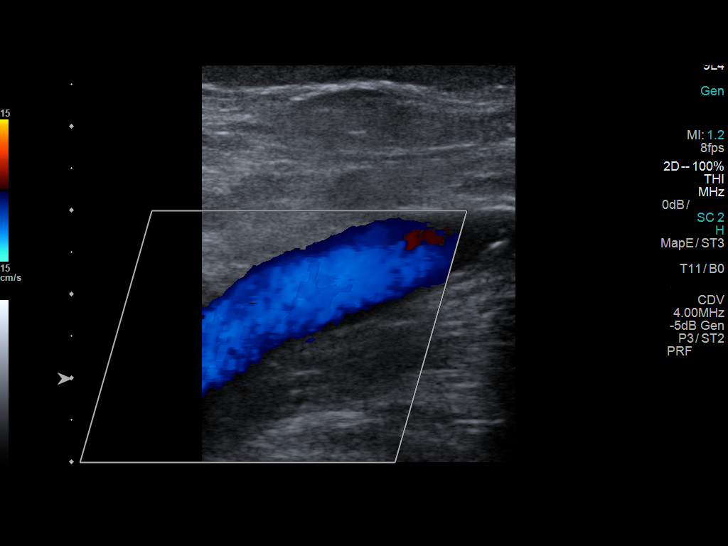
[im 10/38]
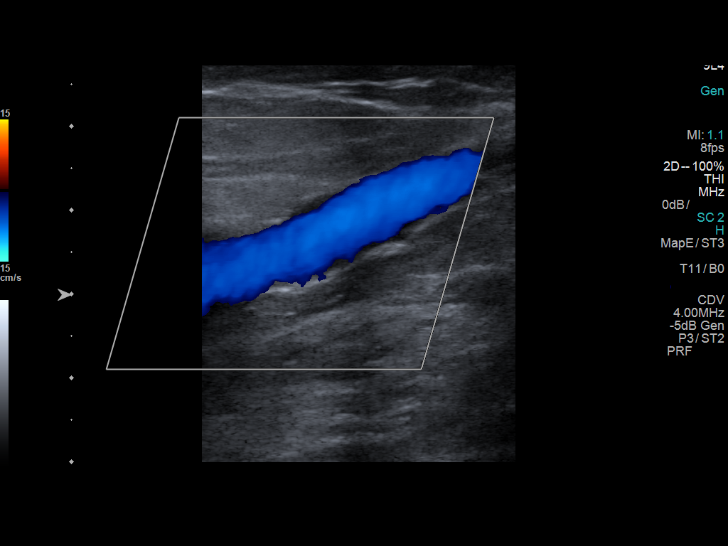
[im 13/38]
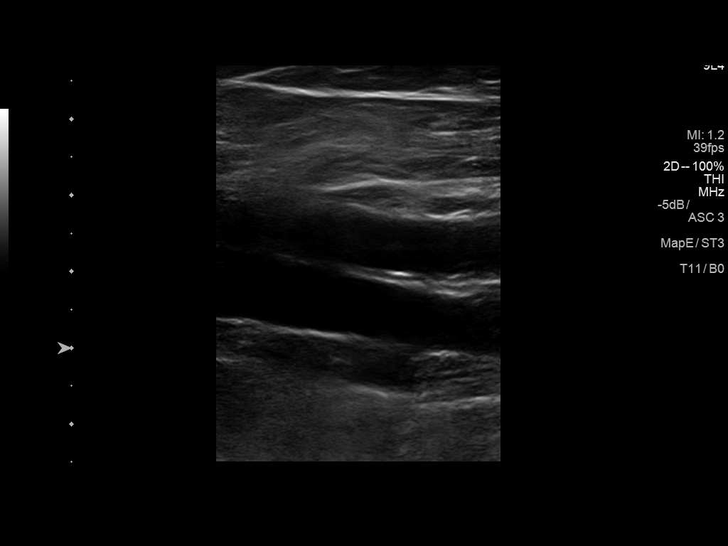
[im 17/38]
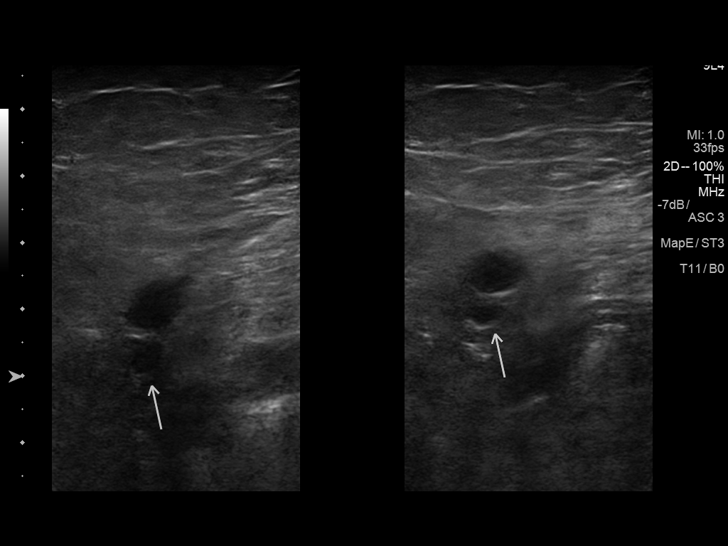
[im 20/38]
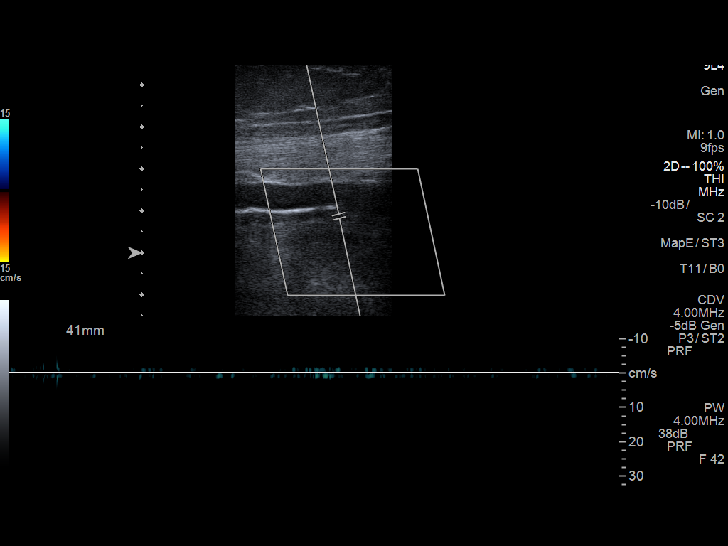
[im 21/38]
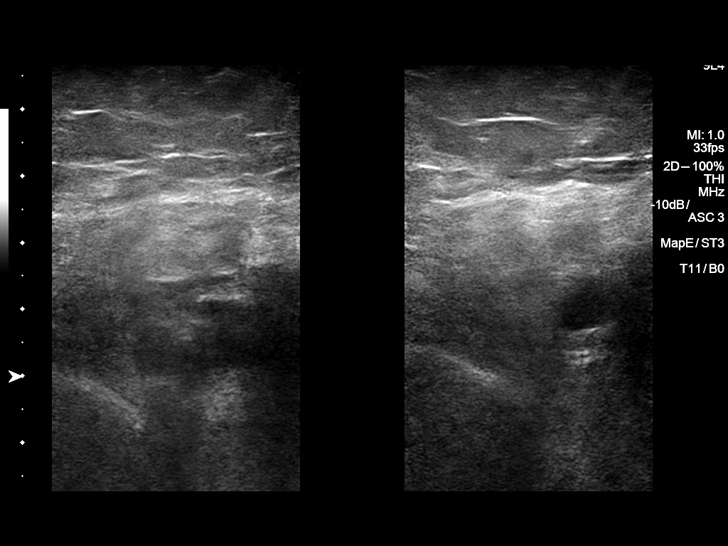
[im 25/38]
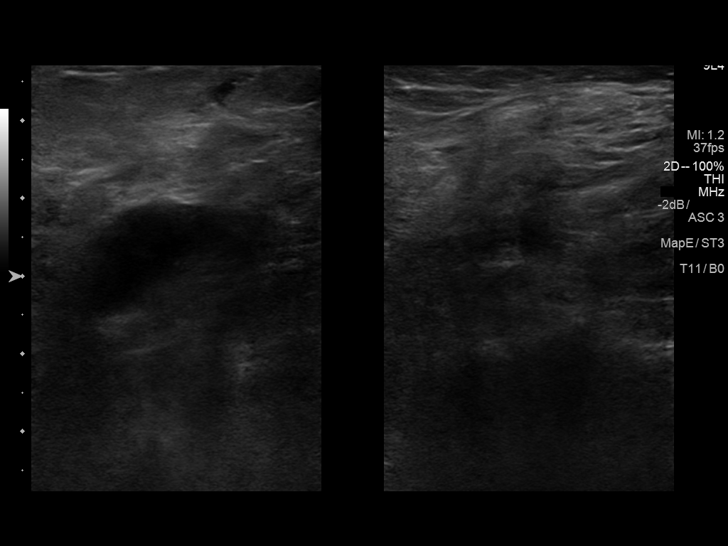
[im 28/38]
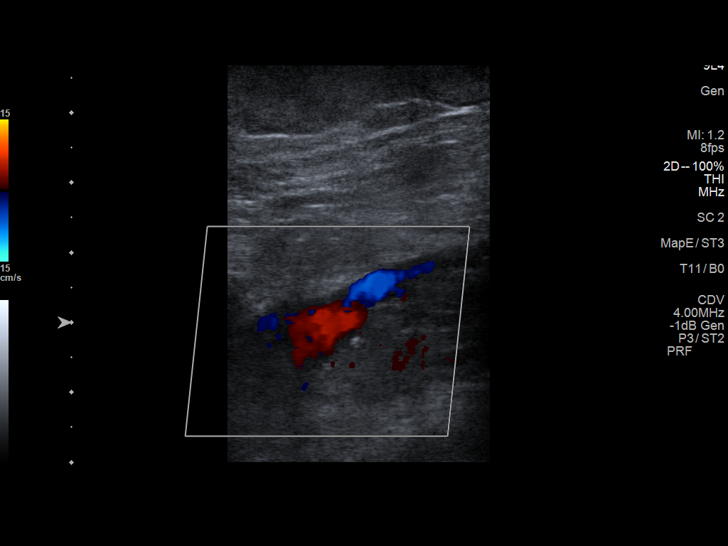
[im 31/38]
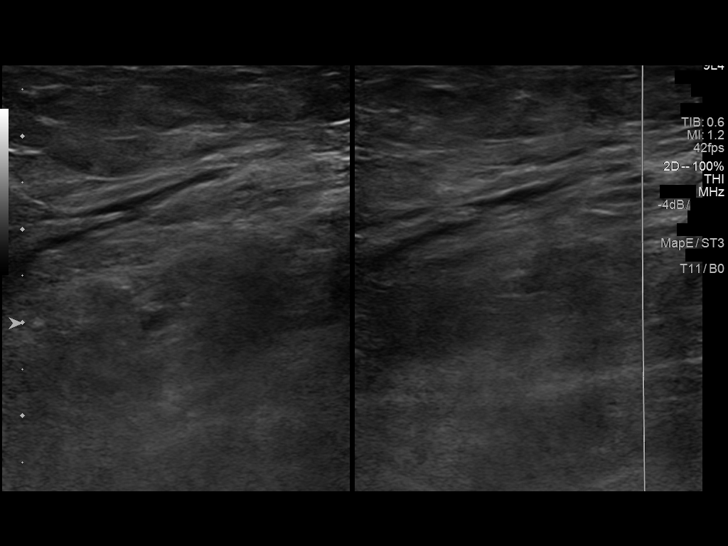
[im 34/38]
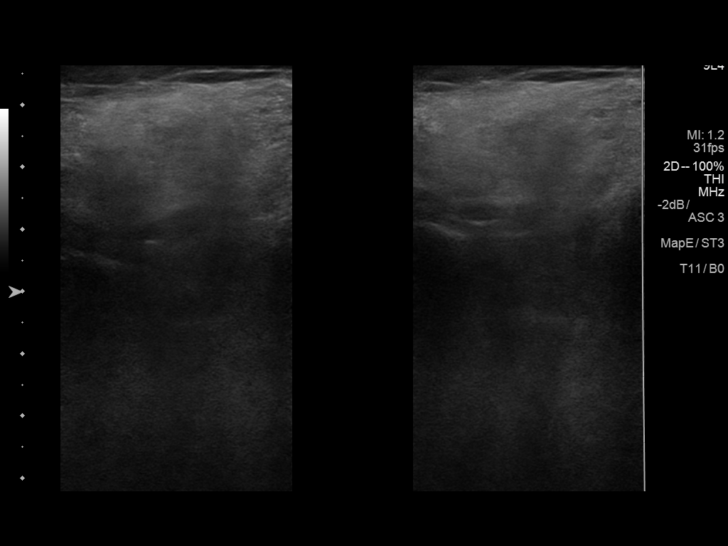
[im 38/38]
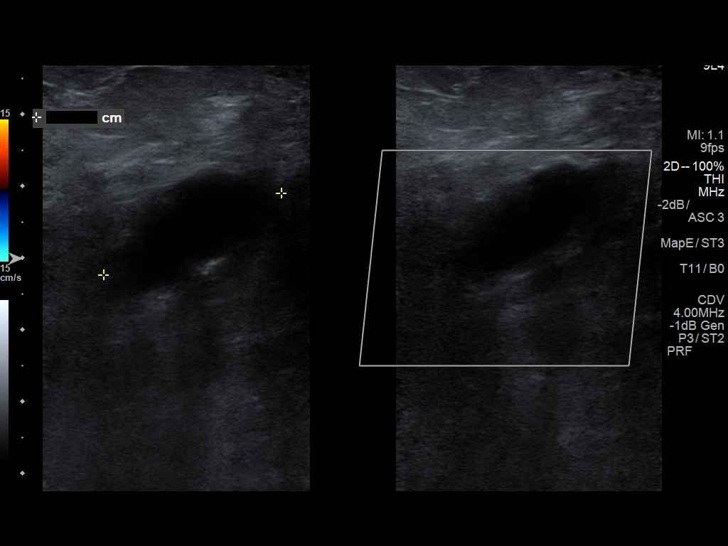

[13 of 24 positions shown; findings below may reference images not displayed]

FINDINGS: Contralateral Common Femoral Vein: Respiratory phasicity is normal
and symmetric with the symptomatic side. No evidence of thrombus.
Normal compressibility.

Common Femoral Vein: No evidence of thrombus. Normal
compressibility, respiratory phasicity and response to augmentation.

Saphenofemoral Junction: No evidence of thrombus. Normal
compressibility and flow on color Doppler imaging.

Profunda Femoral Vein: No evidence of thrombus. Normal
compressibility and flow on color Doppler imaging.

Femoral Vein: Right femoral vein is noncompressible with hypoechoic
intraluminal thrombus appearing nearly occlusive. Phasic flow not
demonstrated. This has more of a chronic and acute appearance.

Popliteal Vein: Right popliteal vein is also noncompressible with
intraluminal hypoechoic thrombus appearing nearly occlusive. DVT
appears more chronic than acute.

Calf Veins: Limited visualization of the calf veins.

Superficial Great Saphenous Vein: No evidence of thrombus. Normal
compressibility and flow on color Doppler imaging.

Venous Reflux:  None.

Other Findings: Limited exam because of body habitus. Popliteal
fossa Baker cyst also evident measuring 4.9 x 2.0 x 2.7 cm
IMPRESSION: Chronic appearing right lower extremity femoral popliteal DVT.
Limited exam because of body habitus.

4.9 cm right bakers cyst.

## 2018-02-09 DIAGNOSIS — I482 Chronic atrial fibrillation: Secondary | ICD-10-CM | POA: Diagnosis not present

## 2018-02-09 DIAGNOSIS — Z7901 Long term (current) use of anticoagulants: Secondary | ICD-10-CM | POA: Diagnosis not present

## 2018-02-11 DIAGNOSIS — N390 Urinary tract infection, site not specified: Secondary | ICD-10-CM | POA: Diagnosis not present

## 2018-02-11 DIAGNOSIS — M545 Low back pain: Secondary | ICD-10-CM | POA: Diagnosis not present

## 2018-02-18 DIAGNOSIS — E119 Type 2 diabetes mellitus without complications: Secondary | ICD-10-CM | POA: Diagnosis not present

## 2018-02-18 DIAGNOSIS — N184 Chronic kidney disease, stage 4 (severe): Secondary | ICD-10-CM | POA: Diagnosis not present

## 2018-02-18 DIAGNOSIS — R233 Spontaneous ecchymoses: Secondary | ICD-10-CM | POA: Diagnosis not present

## 2018-02-18 DIAGNOSIS — Z7901 Long term (current) use of anticoagulants: Secondary | ICD-10-CM | POA: Diagnosis not present

## 2018-02-18 DIAGNOSIS — D649 Anemia, unspecified: Secondary | ICD-10-CM | POA: Diagnosis not present

## 2018-02-24 DIAGNOSIS — M79675 Pain in left toe(s): Secondary | ICD-10-CM | POA: Diagnosis not present

## 2018-02-24 DIAGNOSIS — M79674 Pain in right toe(s): Secondary | ICD-10-CM | POA: Diagnosis not present

## 2018-02-24 DIAGNOSIS — B351 Tinea unguium: Secondary | ICD-10-CM | POA: Diagnosis not present

## 2018-03-02 DIAGNOSIS — E119 Type 2 diabetes mellitus without complications: Secondary | ICD-10-CM | POA: Diagnosis not present

## 2018-03-02 DIAGNOSIS — B351 Tinea unguium: Secondary | ICD-10-CM | POA: Diagnosis not present

## 2018-03-02 DIAGNOSIS — N390 Urinary tract infection, site not specified: Secondary | ICD-10-CM | POA: Diagnosis not present

## 2018-03-02 DIAGNOSIS — I482 Chronic atrial fibrillation: Secondary | ICD-10-CM | POA: Diagnosis not present

## 2018-03-02 DIAGNOSIS — Z7901 Long term (current) use of anticoagulants: Secondary | ICD-10-CM | POA: Diagnosis not present

## 2018-03-02 DIAGNOSIS — E559 Vitamin D deficiency, unspecified: Secondary | ICD-10-CM | POA: Diagnosis not present

## 2018-03-02 DIAGNOSIS — R609 Edema, unspecified: Secondary | ICD-10-CM | POA: Diagnosis not present

## 2018-03-02 DIAGNOSIS — I10 Essential (primary) hypertension: Secondary | ICD-10-CM | POA: Diagnosis not present

## 2018-03-02 DIAGNOSIS — N184 Chronic kidney disease, stage 4 (severe): Secondary | ICD-10-CM | POA: Diagnosis not present

## 2018-03-02 DIAGNOSIS — D649 Anemia, unspecified: Secondary | ICD-10-CM | POA: Diagnosis not present

## 2018-03-02 DIAGNOSIS — L84 Corns and callosities: Secondary | ICD-10-CM | POA: Diagnosis not present

## 2018-03-02 DIAGNOSIS — E782 Mixed hyperlipidemia: Secondary | ICD-10-CM | POA: Diagnosis not present

## 2018-03-10 DIAGNOSIS — I482 Chronic atrial fibrillation: Secondary | ICD-10-CM | POA: Diagnosis not present

## 2018-03-10 DIAGNOSIS — Z7901 Long term (current) use of anticoagulants: Secondary | ICD-10-CM | POA: Diagnosis not present

## 2018-04-06 DIAGNOSIS — I482 Chronic atrial fibrillation: Secondary | ICD-10-CM | POA: Diagnosis not present

## 2018-04-06 DIAGNOSIS — Z7901 Long term (current) use of anticoagulants: Secondary | ICD-10-CM | POA: Diagnosis not present

## 2018-04-13 ENCOUNTER — Other Ambulatory Visit: Payer: Self-pay

## 2018-04-28 DIAGNOSIS — R809 Proteinuria, unspecified: Secondary | ICD-10-CM | POA: Diagnosis not present

## 2018-04-28 DIAGNOSIS — E872 Acidosis: Secondary | ICD-10-CM | POA: Diagnosis not present

## 2018-04-28 DIAGNOSIS — E875 Hyperkalemia: Secondary | ICD-10-CM | POA: Diagnosis not present

## 2018-04-28 DIAGNOSIS — E1122 Type 2 diabetes mellitus with diabetic chronic kidney disease: Secondary | ICD-10-CM | POA: Diagnosis not present

## 2018-04-28 DIAGNOSIS — N184 Chronic kidney disease, stage 4 (severe): Secondary | ICD-10-CM | POA: Diagnosis not present

## 2018-04-28 DIAGNOSIS — D631 Anemia in chronic kidney disease: Secondary | ICD-10-CM | POA: Diagnosis not present

## 2018-04-28 DIAGNOSIS — I129 Hypertensive chronic kidney disease with stage 1 through stage 4 chronic kidney disease, or unspecified chronic kidney disease: Secondary | ICD-10-CM | POA: Diagnosis not present

## 2018-05-04 DIAGNOSIS — Z7901 Long term (current) use of anticoagulants: Secondary | ICD-10-CM | POA: Diagnosis not present

## 2018-05-04 DIAGNOSIS — I482 Chronic atrial fibrillation: Secondary | ICD-10-CM | POA: Diagnosis not present

## 2018-05-14 DIAGNOSIS — N390 Urinary tract infection, site not specified: Secondary | ICD-10-CM | POA: Diagnosis not present

## 2018-05-19 DIAGNOSIS — M79674 Pain in right toe(s): Secondary | ICD-10-CM | POA: Diagnosis not present

## 2018-05-19 DIAGNOSIS — M79675 Pain in left toe(s): Secondary | ICD-10-CM | POA: Diagnosis not present

## 2018-05-19 DIAGNOSIS — B351 Tinea unguium: Secondary | ICD-10-CM | POA: Diagnosis not present

## 2018-06-02 DIAGNOSIS — I482 Chronic atrial fibrillation: Secondary | ICD-10-CM | POA: Diagnosis not present

## 2018-06-02 DIAGNOSIS — Z7901 Long term (current) use of anticoagulants: Secondary | ICD-10-CM | POA: Diagnosis not present

## 2018-06-04 DIAGNOSIS — Z23 Encounter for immunization: Secondary | ICD-10-CM | POA: Diagnosis not present

## 2018-06-08 DIAGNOSIS — D649 Anemia, unspecified: Secondary | ICD-10-CM | POA: Diagnosis not present

## 2018-06-08 DIAGNOSIS — I482 Chronic atrial fibrillation: Secondary | ICD-10-CM | POA: Diagnosis not present

## 2018-06-08 DIAGNOSIS — Z7901 Long term (current) use of anticoagulants: Secondary | ICD-10-CM | POA: Diagnosis not present

## 2018-06-08 DIAGNOSIS — E782 Mixed hyperlipidemia: Secondary | ICD-10-CM | POA: Diagnosis not present

## 2018-06-08 DIAGNOSIS — R51 Headache: Secondary | ICD-10-CM | POA: Diagnosis not present

## 2018-06-08 DIAGNOSIS — N184 Chronic kidney disease, stage 4 (severe): Secondary | ICD-10-CM | POA: Diagnosis not present

## 2018-06-08 DIAGNOSIS — I10 Essential (primary) hypertension: Secondary | ICD-10-CM | POA: Diagnosis not present

## 2018-06-08 DIAGNOSIS — E119 Type 2 diabetes mellitus without complications: Secondary | ICD-10-CM | POA: Diagnosis not present

## 2018-06-10 DIAGNOSIS — L209 Atopic dermatitis, unspecified: Secondary | ICD-10-CM | POA: Diagnosis not present

## 2018-06-16 DIAGNOSIS — B029 Zoster without complications: Secondary | ICD-10-CM | POA: Diagnosis not present

## 2018-06-18 ENCOUNTER — Encounter (HOSPITAL_COMMUNITY): Payer: Self-pay | Admitting: Emergency Medicine

## 2018-06-18 ENCOUNTER — Other Ambulatory Visit: Payer: Self-pay

## 2018-06-18 ENCOUNTER — Emergency Department (HOSPITAL_COMMUNITY): Payer: Medicare Other

## 2018-06-18 ENCOUNTER — Emergency Department (HOSPITAL_COMMUNITY)
Admission: EM | Admit: 2018-06-18 | Discharge: 2018-06-18 | Disposition: A | Discharge status: Home / Self Care | Payer: Medicare Other | Attending: Emergency Medicine | Admitting: Emergency Medicine

## 2018-06-18 DIAGNOSIS — S52602A Unspecified fracture of lower end of left ulna, initial encounter for closed fracture: Secondary | ICD-10-CM | POA: Diagnosis not present

## 2018-06-18 DIAGNOSIS — Z7901 Long term (current) use of anticoagulants: Secondary | ICD-10-CM | POA: Insufficient documentation

## 2018-06-18 DIAGNOSIS — S0081XA Abrasion of other part of head, initial encounter: Secondary | ICD-10-CM | POA: Insufficient documentation

## 2018-06-18 DIAGNOSIS — M25552 Pain in left hip: Secondary | ICD-10-CM | POA: Diagnosis not present

## 2018-06-18 DIAGNOSIS — S299XXA Unspecified injury of thorax, initial encounter: Secondary | ICD-10-CM | POA: Diagnosis not present

## 2018-06-18 DIAGNOSIS — N184 Chronic kidney disease, stage 4 (severe): Secondary | ICD-10-CM | POA: Insufficient documentation

## 2018-06-18 DIAGNOSIS — M25539 Pain in unspecified wrist: Secondary | ICD-10-CM | POA: Diagnosis not present

## 2018-06-18 DIAGNOSIS — Y929 Unspecified place or not applicable: Secondary | ICD-10-CM | POA: Diagnosis not present

## 2018-06-18 DIAGNOSIS — F0390 Unspecified dementia without behavioral disturbance: Secondary | ICD-10-CM | POA: Insufficient documentation

## 2018-06-18 DIAGNOSIS — Y939 Activity, unspecified: Secondary | ICD-10-CM | POA: Insufficient documentation

## 2018-06-18 DIAGNOSIS — S52502A Unspecified fracture of the lower end of left radius, initial encounter for closed fracture: Secondary | ICD-10-CM | POA: Diagnosis not present

## 2018-06-18 DIAGNOSIS — R600 Localized edema: Secondary | ICD-10-CM | POA: Insufficient documentation

## 2018-06-18 DIAGNOSIS — Z79899 Other long term (current) drug therapy: Secondary | ICD-10-CM | POA: Insufficient documentation

## 2018-06-18 DIAGNOSIS — M25532 Pain in left wrist: Secondary | ICD-10-CM | POA: Diagnosis not present

## 2018-06-18 DIAGNOSIS — Z23 Encounter for immunization: Secondary | ICD-10-CM | POA: Insufficient documentation

## 2018-06-18 DIAGNOSIS — W19XXXA Unspecified fall, initial encounter: Secondary | ICD-10-CM | POA: Diagnosis not present

## 2018-06-18 DIAGNOSIS — I129 Hypertensive chronic kidney disease with stage 1 through stage 4 chronic kidney disease, or unspecified chronic kidney disease: Secondary | ICD-10-CM | POA: Diagnosis not present

## 2018-06-18 DIAGNOSIS — Y999 Unspecified external cause status: Secondary | ICD-10-CM | POA: Insufficient documentation

## 2018-06-18 DIAGNOSIS — E119 Type 2 diabetes mellitus without complications: Secondary | ICD-10-CM | POA: Insufficient documentation

## 2018-06-18 DIAGNOSIS — S199XXA Unspecified injury of neck, initial encounter: Secondary | ICD-10-CM | POA: Diagnosis not present

## 2018-06-18 DIAGNOSIS — R52 Pain, unspecified: Secondary | ICD-10-CM | POA: Diagnosis not present

## 2018-06-18 DIAGNOSIS — S0121XA Laceration without foreign body of nose, initial encounter: Secondary | ICD-10-CM | POA: Diagnosis not present

## 2018-06-18 DIAGNOSIS — I4891 Unspecified atrial fibrillation: Secondary | ICD-10-CM | POA: Diagnosis not present

## 2018-06-18 DIAGNOSIS — R221 Localized swelling, mass and lump, neck: Secondary | ICD-10-CM | POA: Diagnosis not present

## 2018-06-18 DIAGNOSIS — S79912A Unspecified injury of left hip, initial encounter: Secondary | ICD-10-CM | POA: Diagnosis not present

## 2018-06-18 DIAGNOSIS — S0990XA Unspecified injury of head, initial encounter: Secondary | ICD-10-CM | POA: Diagnosis not present

## 2018-06-18 DIAGNOSIS — I10 Essential (primary) hypertension: Secondary | ICD-10-CM | POA: Diagnosis not present

## 2018-06-18 LAB — COMPREHENSIVE METABOLIC PANEL
ALBUMIN: 3.7 g/dL (ref 3.5–5.0)
ALT: 11 U/L (ref 0–44)
ANION GAP: 11 (ref 5–15)
AST: 25 U/L (ref 15–41)
Alkaline Phosphatase: 82 U/L (ref 38–126)
BUN: 28 mg/dL — ABNORMAL HIGH (ref 8–23)
CO2: 21 mmol/L — ABNORMAL LOW (ref 22–32)
Calcium: 9.6 mg/dL (ref 8.9–10.3)
Chloride: 106 mmol/L (ref 98–111)
Creatinine, Ser: 1.68 mg/dL — ABNORMAL HIGH (ref 0.44–1.00)
GFR calc non Af Amer: 26 mL/min — ABNORMAL LOW (ref >60)
GFR, EST AFRICAN AMERICAN: 30 mL/min — AB (ref >60)
GLUCOSE: 246 mg/dL — AB (ref 70–99)
POTASSIUM: 4 mmol/L (ref 3.5–5.1)
Sodium: 138 mmol/L (ref 135–145)
TOTAL PROTEIN: 8.1 g/dL (ref 6.5–8.1)
Total Bilirubin: 1.5 mg/dL — ABNORMAL HIGH (ref 0.3–1.2)

## 2018-06-18 LAB — CBC WITH DIFFERENTIAL/PLATELET
Abs Immature Granulocytes: 0.07 10*3/uL (ref 0.00–0.07)
Basophils Absolute: 0.1 10*3/uL (ref 0.0–0.1)
Basophils Relative: 1 %
EOS ABS: 0.2 10*3/uL (ref 0.0–0.5)
Eosinophils Relative: 2 %
HEMATOCRIT: 38.5 % (ref 36.0–46.0)
Hemoglobin: 12.2 g/dL (ref 12.0–15.0)
Immature Granulocytes: 1 %
LYMPHS ABS: 0.7 10*3/uL (ref 0.7–4.0)
Lymphocytes Relative: 7 %
MCH: 27.8 pg (ref 26.0–34.0)
MCHC: 31.7 g/dL (ref 30.0–36.0)
MCV: 87.7 fL (ref 80.0–100.0)
Monocytes Absolute: 0.5 10*3/uL (ref 0.1–1.0)
Monocytes Relative: 5 %
NEUTROS PCT: 84 %
Neutro Abs: 8.7 10*3/uL — ABNORMAL HIGH (ref 1.7–7.7)
Platelets: 260 10*3/uL (ref 150–400)
RBC: 4.39 MIL/uL (ref 3.87–5.11)
RDW: 14.8 % (ref 11.5–15.5)
WBC: 10.3 10*3/uL (ref 4.0–10.5)
nRBC: 0 % (ref 0.0–0.2)

## 2018-06-18 LAB — PROTIME-INR
INR: 1.68
PROTHROMBIN TIME: 19.6 s — AB (ref 11.4–15.2)

## 2018-06-18 MED ORDER — TRAMADOL HCL 50 MG PO TABS: 50.0000 mg | ORAL_TABLET | Freq: Four times a day (QID) | ORAL | 0 refills | Status: DC | PRN

## 2018-06-18 MED ORDER — TETANUS-DIPHTH-ACELL PERTUSSIS 5-2.5-18.5 LF-MCG/0.5 IM SUSP
0.5000 mL | Freq: Once | INTRAMUSCULAR | Status: AC
  Administered 2018-06-18: 0.5 mL via INTRAMUSCULAR
  Filled 2018-06-18: qty 0.5

## 2018-06-18 NOTE — Discharge Instructions (Signed)
He need to follow-up with either Dr. Aline Brochure in [place] or Dr. Fredna Dow in [place] or what ever orthopedic surgeon he would like to see next week.  Keep your arm elevated.  Take the tramadol for pain if the pain is not relieved by Tylenol alone

## 2018-06-18 NOTE — ED Notes (Signed)
Med list of prescribed meds sent but no MAR stating when the pt last had meds

## 2018-06-18 NOTE — ED Notes (Signed)
Pt has a skin tear to nose. Is complaining of left wrist pain. Slight deformity noted.

## 2018-06-18 NOTE — ED Provider Notes (Signed)
[facility] EMERGENCY DEPARTMENT Provider Note   CSN: [id number] Arrival date & time: 06/18/18  2706     History   Chief Complaint Chief Complaint  Patient presents with  . Fall    HPI  JOHNS is a 84 y.o. female.  Patient complains of pain in her left wrist.  Patient has history of dementia and fell today.  Her daughter states that her mental status is back to her normal.  The history is provided by the patient, a relative and the nursing home.  Fall  This is a new problem. The current episode started 1 to 2 hours ago. The problem occurs rarely. The problem has been resolved. Pertinent negatives include no chest pain. Nothing aggravates the symptoms. Nothing relieves the symptoms. She has tried nothing for the symptoms. The treatment provided no relief.    Past Medical History:  Diagnosis Date  . Anemia   . Atrial fibrillation ([place])   . Chronic kidney disease   . Diabetes mellitus without complication ([place])   . DVT (deep venous thrombosis) ([place])   . Hyperlipidemia   . Hypertension   . PE (pulmonary embolism)     Patient Active Problem List   Diagnosis Date Noted  . Long term current use of anticoagulant therapy 12/06/2015  . Hypoglycemia 10/27/2015  . MDD (major depressive disorder), recurrent severe, without psychosis ([place]) 10/26/2015  . Dementia with behavioral disturbance ([place]) 10/26/2015  . Chronic kidney disease (CKD), stage IV (severe) ([place])   . Suicidal overdose ([place]) 10/24/2015  . Depression 10/24/2015  . Other acute pulmonary embolism without acute cor pulmonale ([place]) 08/31/2015  . Long term (current) use of anticoagulants [Z79.01] 08/31/2015  . Pulmonary embolism ([place]) 08/23/2015  . Shortness of breath 08/22/2015  . Dyspnea 08/17/2015  . DOE (dyspnea on exertion) 08/17/2015  . GERD (gastroesophageal reflux disease) 02/23/2014  . Peripheral edema 02/23/2014  . GI bleed 04/05/2013  . Atrial fibrillation ([place]) 02/24/2013  . HTN (hypertension)  12/26/2010  . Chronic kidney disease (CKD) stage G3b/A2, moderately decreased glomerular filtration rate (GFR) between 30-44 mL/min/1.73 square meter and albuminuria creatinine ratio between 30-299 mg/g ([facility]) 12/26/2010  . Hyperlipemia 12/26/2010  . Peripheral neuropathy 12/26/2010  . Anemia 12/26/2010  . Diabetes ([place]) 12/26/2010  . Vertebral fracture 12/26/2010  . Venous insufficiency 12/26/2010  . Osteoporosis 12/26/2010  . Thyromegaly 12/26/2010    Past Surgical History:  Procedure Laterality Date  . ABDOMINAL HYSTERECTOMY    . bladder tack    . CHOLECYSTECTOMY    . COLONOSCOPY N/A 05/03/2013   CBJ:SEGBTD MOST LIKELY DUE TO ONE AVM in the ascending colon/Moderate diverticulosis hroughout the entire examined colon/Two POLYPS REMOVED/Small internal hemorrhoids  . ESOPHAGOGASTRODUODENOSCOPY N/A 04/06/2013   Procedure: ESOPHAGOGASTRODUODENOSCOPY (EGD);  Surgeon: Danie Binder, MD;  Location: [facility] SU[facility];  Service: Endoscopy;  Laterality: N/A;  8:45  . GIVENS CAPSULE STUDY  04/06/2013   Procedure: GIVENS CAPSULE STUDY;  Surgeon: Danie Binder, MD;  Location: [facility] ENDO SUITE;  Service: Endoscopy;;     OB History    Gravida  2   Para  2   Term  2   Preterm      AB      Living  2     SAB      TAB      Ectopic      Multiple      Live Births               Home Medications  Prior to Admission medications   Medication Sig Start Date End Date Taking? Authorizing Provider  azithromycin (ZITHROMAX) 500 MG tablet Take by mouth daily.   Yes [provider]  calcitRIOL (ROCALTROL) 0.25 MCG capsule Take 0.25 mcg by mouth 3 (three) times a week. Tuesdays,THursdays, and Saturdays 07/11/15  Yes [provider]  carvedilol (COREG) 12.5 MG tablet Take 1 tablet (12.5 mg total) by mouth 2 (two) times daily with a meal. 08/18/15  Yes Elgergawy, Silver Huguenin, MD  Cholecalciferol 2000 units TABS Take 2 tablets by mouth daily.   Yes [provider]    fluticasone (FLONASE) 50 MCG/ACT nasal spray Place 1 spray into both nostrils daily.   Yes [provider]  furosemide (LASIX) 40 MG tablet Take 40 mg by mouth every morning.   Yes [provider]  glimepiride (AMARYL) 1 MG tablet Take 1 tablet (1 mg total) by mouth daily with breakfast. Patient taking differently: Take 2 mg by mouth daily with breakfast.  01/21/16  Yes Isla Pence, MD  montelukast (SINGULAIR) 10 MG tablet Take 10 mg by mouth daily.   Yes [provider]  Psyllium 400 MG CAPS Take 400 mg by mouth daily.   Yes [provider]  senna (SENOKOT) 8.6 MG TABS tablet Take 1 tablet by mouth daily as needed for mild constipation or moderate constipation.    Yes [provider]  sodium bicarbonate 325 MG tablet Take 325 mg by mouth 2 (two) times daily.    Yes [provider]  valACYclovir (VALTREX) 1000 MG tablet Take 1,000 mg by mouth 3 (three) times daily. 06/16/18 06/22/18 Yes [provider]  warfarin (COUMADIN) 2 MG tablet Take 2mg  on thursdays.  Take 3mg  all other days. 01/24/16  Yes Cherre Robins, PharmD  ciprofloxacin (CIPRO) 500 MG tablet Take 1 tablet (500 mg total) by mouth 2 (two) times daily. Patient not taking: Reported on 06/18/2018 12/02/17   Fredia Sorrow, MD  epoetin alfa (EPOGEN,PROCRIT) 2000 UNIT/ML injection Inject 2,000 Units into the vein every 21 ( twenty-one) days. Reported on 11/08/2015    [provider]  traMADol (ULTRAM) 50 MG tablet Take 1 tablet (50 mg total) by mouth every 6 (six) hours as needed. 06/18/18   Milton Ferguson, MD    Family History Family History  Problem Relation Age of Onset  . Colon cancer Neg Hx     Social History Social History   Tobacco Use  . Smoking status: Never Smoker  . Smokeless tobacco: Never Used  Substance Use Topics  . Alcohol use: No  . Drug use: No     Allergies   Penicillins; Benicar [olmesartan medoxomil]; Ciprofloxacin; Hctz  [hydrochlorothiazide]; Januvia [sitagliptin phosphate]; Norvasc [amlodipine besylate]; and Ramipril   Review of Systems Review of Systems  Unable to perform ROS: Dementia  Cardiovascular: Negative for chest pain.     Physical Exam Updated Vital Signs BP (!) 161/121   Pulse 97   Temp 98.6 F (37 C) (Oral)   Resp 20   Ht 5\' 5"  (1.651 m)   Wt 75.8 kg   SpO2 95%   BMI 27.79 kg/m   Physical Exam  Constitutional: She is oriented to person, place, and time. She appears well-developed.  HENT:  Head: Normocephalic.  Abrasion to face  Eyes: Conjunctivae and EOM are normal. No scleral icterus.  Neck: Neck supple. No thyromegaly present.  Cardiovascular: Normal rate and regular rhythm. Exam reveals no gallop and no friction rub.  No murmur heard. Pulmonary/Chest:  No stridor. She has no wheezes. She has no rales. She exhibits no tenderness.  Abdominal: She exhibits no distension. There is no tenderness. There is no rebound.  Musculoskeletal: Normal range of motion. She exhibits no edema.  Tender swollen left wrist  Lymphadenopathy:    She has no cervical adenopathy.  Neurological: She is oriented to person, place, and time. She exhibits normal muscle tone. Coordination normal.  Skin: No rash noted. No erythema.  Psychiatric: She has a normal mood and affect. Her behavior is normal.     ED Treatments / Results  Labs (all labs ordered are listed, but only abnormal results are displayed) Labs Reviewed  CBC WITH DIFFERENTIAL/PLATELET - Abnormal; Notable for the following components:      Result Value   Neutro Abs 8.7 (*)    All other components within normal limits  COMPREHENSIVE METABOLIC PANEL - Abnormal; Notable for the following components:   CO2 21 (*)    Glucose, Bld 246 (*)    BUN 28 (*)    Creatinine, Ser 1.68 (*)    Total Bilirubin 1.5 (*)    GFR calc non Af Amer 26 (*)    GFR calc Af Amer 30 (*)    All other components within normal limits  PROTIME-INR -  Abnormal; Notable for the following components:   Prothrombin Time 19.6 (*)    All other components within normal limits    EKG EKG Interpretation  Date/Time:  Thursday June 18 2018 06:20:12 EDT Ventricular Rate:  95 PR Interval:    QRS Duration: 109 QT Interval:  373 QTC Calculation: 469 R Axis:   47 Text Interpretation:  Atrial fibrillation Low voltage, precordial leads Borderline repolarization abnormality No significant change since last tracing Confirmed by Ripley Fraise [phone]) on 06/18/2018 6:34:39 AM   Radiology Dg Chest 1 View  Result Date: 06/18/2018 CLINICAL DATA:  Unwitnessed falls today. EXAM: CHEST  1 VIEW COMPARISON:  Two-view chest x-ray 08/22/2015 FINDINGS: The heart size is exaggerate by low lung volumes. There is mild increase in a diffuse interstitial pattern. Atherosclerotic calcifications are present at the aorta. No focal airspace disease is present. No significant effusions are present. Visualized bony thorax is stable. No acute fractures are present. There is no pneumothorax. IMPRESSION: 1. Low lung volumes. 2. Increased interstitial pattern.  Question mild edema. 3. No acute trauma. 4. Aortic atherosclerosis. Electronically Signed   By: San Morelle M.D.   On: 06/18/2018 08:38   Dg Wrist Complete Left  Result Date: 06/18/2018 CLINICAL DATA:  Unwitnessed fall. Left wrist pain. Initial encounter. EXAM: LEFT WRIST - COMPLETE 3+ VIEW COMPARISON:  None. FINDINGS: The bones are diffusely osteopenic. There is a comminuted, impacted fracture of the distal radius with extension to the distal articular surface and with dorsal angulation. There is also a mildly displaced ulnar styloid fracture. There is no dislocation. Mild-to-moderate degenerative changes are noted at the first [facility] joint. Soft tissue swelling is noted about the wrist. IMPRESSION: Distal radius and ulnar fractures. Electronically Signed   By: Logan Bores M.D.   On: 06/18/2018 08:38   Ct Head  Wo Contrast  Result Date: 06/18/2018 CLINICAL DATA:  Unwitnessed fall. Nasal laceration and right orbital swelling. Initial encounter. EXAM: CT HEAD WITHOUT CONTRAST CT MAXILLOFACIAL WITHOUT CONTRAST CT CERVICAL SPINE WITHOUT CONTRAST TECHNIQUE: Multidetector CT imaging of the head, cervical spine, and maxillofacial structures were performed using the standard protocol without intravenous contrast. Multiplanar CT image reconstructions of the cervical spine and maxillofacial structures were  also generated. COMPARISON:  None. FINDINGS: CT HEAD FINDINGS Brain: There is a small chronic infarct in the left frontal lobe at the anterior aspect of the operculum. Bilateral cerebral white matter hypodensities are nonspecific but compatible with mild chronic small vessel ischemic disease. There is no evidence of acute infarct, intracranial hemorrhage, mass, midline shift, or extra-axial fluid collection. Generalized cerebral atrophy is within normal limits for age. Vascular: Calcified atherosclerosis at the skull base. No hyperdense vessel. Skull: No fracture or suspicious osseous lesion. Other: None. CT MAXILLOFACIAL FINDINGS Osseous: No displaced nasal bone fracture is identified, however motion artifact limits assessment for a nondisplaced fracture. No maxillofacial fracture is identified elsewhere. The temporomandibular joints are located. Orbits: Bilateral cataract extraction. No acute traumatic finding. Sinuses: Complete left sphenoid sinus opacification with complex, partially calcified material centrally and diffuse sclerosis of the sinus walls. Mild right maxillary sinus mucosal thickening. Clear mastoid air cells. Soft tissues: Mild nasal soft tissue swelling. CT CERVICAL SPINE FINDINGS Alignment: Cervical spine straightening. 2 mm anterolisthesis of C3 on C4, likely degenerative and facet mediated. Skull base and vertebrae: No acute fracture or suspicious osseous lesion. Mild C1-2 arthropathy. Soft tissues and  spinal canal: No prevertebral fluid or swelling. No visible canal hematoma. Disc levels: Advanced disc space narrowing from C4-5 to C6-7 with degenerative endplate sclerosis and spurring. Mild to moderate left neural foraminal stenosis at C5-6 due to uncovertebral spurring. Facet arthrosis most advanced on the left at C3-4 and on the right at C7-T1. Upper chest: Ground-glass opacities with interlobular septal thickening in the lung apices. Other: 1.8 cm hypoattenuating right thyroid nodule. Aortic and carotid artery atherosclerosis. IMPRESSION: 1. No evidence of acute intracranial abnormality. 2. Mild chronic small vessel ischemic disease. 3. Chronic left frontal lobe infarct. 4. No acute maxillofacial fracture identified within limitations of motion artifact. 5. Chronic left sphenoid sinusitis. 6. No evidence of acute fracture in the cervical spine. 7. Ground-glass opacities in the lung apices suspicious for edema. 8. 1.8 cm right thyroid nodule. This could be further evaluated with thyroid ultrasound if clinically desired. Aortic Atherosclerosis (ICD10-I70.0). Electronically Signed   By: Logan Bores M.D.   On: 06/18/2018 08:53   Ct Cervical Spine Wo Contrast  Result Date: 06/18/2018 CLINICAL DATA:  Unwitnessed fall. Nasal laceration and right orbital swelling. Initial encounter. EXAM: CT HEAD WITHOUT CONTRAST CT MAXILLOFACIAL WITHOUT CONTRAST CT CERVICAL SPINE WITHOUT CONTRAST TECHNIQUE: Multidetector CT imaging of the head, cervical spine, and maxillofacial structures were performed using the standard protocol without intravenous contrast. Multiplanar CT image reconstructions of the cervical spine and maxillofacial structures were also generated. COMPARISON:  None. FINDINGS: CT HEAD FINDINGS Brain: There is a small chronic infarct in the left frontal lobe at the anterior aspect of the operculum. Bilateral cerebral white matter hypodensities are nonspecific but compatible with mild chronic small vessel  ischemic disease. There is no evidence of acute infarct, intracranial hemorrhage, mass, midline shift, or extra-axial fluid collection. Generalized cerebral atrophy is within normal limits for age. Vascular: Calcified atherosclerosis at the skull base. No hyperdense vessel. Skull: No fracture or suspicious osseous lesion. Other: None. CT MAXILLOFACIAL FINDINGS Osseous: No displaced nasal bone fracture is identified, however motion artifact limits assessment for a nondisplaced fracture. No maxillofacial fracture is identified elsewhere. The temporomandibular joints are located. Orbits: Bilateral cataract extraction. No acute traumatic finding. Sinuses: Complete left sphenoid sinus opacification with complex, partially calcified material centrally and diffuse sclerosis of the sinus walls. Mild right maxillary sinus mucosal thickening. Clear mastoid air cells. Soft  tissues: Mild nasal soft tissue swelling. CT CERVICAL SPINE FINDINGS Alignment: Cervical spine straightening. 2 mm anterolisthesis of C3 on C4, likely degenerative and facet mediated. Skull base and vertebrae: No acute fracture or suspicious osseous lesion. Mild C1-2 arthropathy. Soft tissues and spinal canal: No prevertebral fluid or swelling. No visible canal hematoma. Disc levels: Advanced disc space narrowing from C4-5 to C6-7 with degenerative endplate sclerosis and spurring. Mild to moderate left neural foraminal stenosis at C5-6 due to uncovertebral spurring. Facet arthrosis most advanced on the left at C3-4 and on the right at C7-T1. Upper chest: Ground-glass opacities with interlobular septal thickening in the lung apices. Other: 1.8 cm hypoattenuating right thyroid nodule. Aortic and carotid artery atherosclerosis. IMPRESSION: 1. No evidence of acute intracranial abnormality. 2. Mild chronic small vessel ischemic disease. 3. Chronic left frontal lobe infarct. 4. No acute maxillofacial fracture identified within limitations of motion artifact. 5.  Chronic left sphenoid sinusitis. 6. No evidence of acute fracture in the cervical spine. 7. Ground-glass opacities in the lung apices suspicious for edema. 8. 1.8 cm right thyroid nodule. This could be further evaluated with thyroid ultrasound if clinically desired. Aortic Atherosclerosis (ICD10-I70.0). Electronically Signed   By: Logan Bores M.D.   On: 06/18/2018 08:53   Dg Hip Unilat With Pelvis 2-3 Views Left  Result Date: 06/18/2018 CLINICAL DATA:  Left hip pain due to a fall this morning. Initial encounter. EXAM: DG HIP (WITH OR WITHOUT PELVIS) 2-3V LEFT COMPARISON:  None. FINDINGS: There is no acute bony or joint abnormality. Mild degenerative change is present about the hips. IMPRESSION: No acute abnormality. Electronically Signed   By: Inge Rise M.D.   On: 06/18/2018 08:38   Ct Maxillofacial Wo Contrast  Result Date: 06/18/2018 CLINICAL DATA:  Unwitnessed fall. Nasal laceration and right orbital swelling. Initial encounter. EXAM: CT HEAD WITHOUT CONTRAST CT MAXILLOFACIAL WITHOUT CONTRAST CT CERVICAL SPINE WITHOUT CONTRAST TECHNIQUE: Multidetector CT imaging of the head, cervical spine, and maxillofacial structures were performed using the standard protocol without intravenous contrast. Multiplanar CT image reconstructions of the cervical spine and maxillofacial structures were also generated. COMPARISON:  None. FINDINGS: CT HEAD FINDINGS Brain: There is a small chronic infarct in the left frontal lobe at the anterior aspect of the operculum. Bilateral cerebral white matter hypodensities are nonspecific but compatible with mild chronic small vessel ischemic disease. There is no evidence of acute infarct, intracranial hemorrhage, mass, midline shift, or extra-axial fluid collection. Generalized cerebral atrophy is within normal limits for age. Vascular: Calcified atherosclerosis at the skull base. No hyperdense vessel. Skull: No fracture or suspicious osseous lesion. Other: None. CT  MAXILLOFACIAL FINDINGS Osseous: No displaced nasal bone fracture is identified, however motion artifact limits assessment for a nondisplaced fracture. No maxillofacial fracture is identified elsewhere. The temporomandibular joints are located. Orbits: Bilateral cataract extraction. No acute traumatic finding. Sinuses: Complete left sphenoid sinus opacification with complex, partially calcified material centrally and diffuse sclerosis of the sinus walls. Mild right maxillary sinus mucosal thickening. Clear mastoid air cells. Soft tissues: Mild nasal soft tissue swelling. CT CERVICAL SPINE FINDINGS Alignment: Cervical spine straightening. 2 mm anterolisthesis of C3 on C4, likely degenerative and facet mediated. Skull base and vertebrae: No acute fracture or suspicious osseous lesion. Mild C1-2 arthropathy. Soft tissues and spinal canal: No prevertebral fluid or swelling. No visible canal hematoma. Disc levels: Advanced disc space narrowing from C4-5 to C6-7 with degenerative endplate sclerosis and spurring. Mild to moderate left neural foraminal stenosis at C5-6 due to uncovertebral spurring.  Facet arthrosis most advanced on the left at C3-4 and on the right at C7-T1. Upper chest: Ground-glass opacities with interlobular septal thickening in the lung apices. Other: 1.8 cm hypoattenuating right thyroid nodule. Aortic and carotid artery atherosclerosis. IMPRESSION: 1. No evidence of acute intracranial abnormality. 2. Mild chronic small vessel ischemic disease. 3. Chronic left frontal lobe infarct. 4. No acute maxillofacial fracture identified within limitations of motion artifact. 5. Chronic left sphenoid sinusitis. 6. No evidence of acute fracture in the cervical spine. 7. Ground-glass opacities in the lung apices suspicious for edema. 8. 1.8 cm right thyroid nodule. This could be further evaluated with thyroid ultrasound if clinically desired. Aortic Atherosclerosis (ICD10-I70.0). Electronically Signed   By: Logan Bores M.D.   On: 06/18/2018 08:53    Procedures Procedures (including critical care time)  Medications Ordered in ED Medications  Tdap (BOOSTRIX) injection 0.5 mL (has no administration in time range)     Initial Impression / Assessment and Plan / ED Course  I have reviewed the triage vital signs and the nursing notes.  Pertinent labs & imaging results that were available during my care of the patient were reviewed by me and considered in my medical decision making (see chart for details).   Labs unremarkable except for elevated glucose.  CT scan of the head neck and face shows no acute disease.  Plain films of the pelvis negative and chest x-ray negative but the left wrist is fractured.  No significant displacement to rest.  She will be put in a splint and referred to orthopedics.    Final Clinical Impressions(s) / ED Diagnoses   Final diagnoses:  Fall, initial encounter    ED Discharge Orders         Ordered    traMADol (ULTRAM) 50 MG tablet  Every 6 hours PRN     06/18/18 1049           Milton Ferguson, MD 06/18/18 1052

## 2018-06-18 NOTE — ED Triage Notes (Signed)
Pt from [facility] via EMS, pt was seen in bed last night around 2300 and this morning was found in a different room that is under construction face down on the floor, pt had changed out of her pj's into regular clothing, pt c/o left arm/forearm and wrist pain, has skin tear to nose, hx of htn with BP upon arrival of 190/125, pt is also being treated for UTI and is on day 2 of 7 abx, pt confusion is normal baseline per EMS

## 2018-06-18 NOTE — ED Notes (Signed)
Patient wrist splint placed on left wrist as ordered instructions given to daughter at bedside patient stable.discharged back to [place] by daughter

## 2018-06-18 NOTE — ED Notes (Signed)
Pt to xray

## 2018-06-19 ENCOUNTER — Encounter (INDEPENDENT_AMBULATORY_CARE_PROVIDER_SITE_OTHER): Payer: Self-pay | Admitting: Orthopaedic Surgery

## 2018-06-19 ENCOUNTER — Ambulatory Visit (INDEPENDENT_AMBULATORY_CARE_PROVIDER_SITE_OTHER): Payer: Medicare Other | Admitting: Orthopaedic Surgery

## 2018-06-19 VITALS — BP 163/95 | HR 70 | Ht 66.0 in | Wt 164.0 lb

## 2018-06-19 DIAGNOSIS — S52602A Unspecified fracture of lower end of left ulna, initial encounter for closed fracture: Secondary | ICD-10-CM

## 2018-06-19 DIAGNOSIS — S52502A Unspecified fracture of the lower end of left radius, initial encounter for closed fracture: Secondary | ICD-10-CM | POA: Diagnosis not present

## 2018-06-19 NOTE — Progress Notes (Signed)
Office Visit Note   Patient: Julie Carpenter           Date of Birth: Apr 05, 1926           MRN: [id number] Visit Date: 06/19/2018              Requested by: Vidal Schwalbe, MD [address] [place], [place] [address] PCP: Vidal Schwalbe, MD   Assessment & Plan: Visit Diagnoses:  1. Closed fracture of distal ends of left radius and ulna, initial encounter     Plan: Appropriate splint applied should keep this on until she returns in 4 weeks.  Prescription written for skilled facility for ice elevation.  Follow-Up Instructions: No follow-ups on file.   Orders:  No orders of the defined types were placed in this encounter.  No orders of the defined types were placed in this encounter.     Procedures: No procedures performed   Clinical Data: No additional findings.   Subjective: Chief Complaint  Patient presents with  . Left Wrist - Fracture    HPI 84 year old female is here with her daughters and seen with a fall on 06/18/2018 where she was seen [facility]  ER.  Patient has dementia, peripheral neuropathy, history of vertebral fractures, osteoporosis.  She skinned up her nose with a fall and x-rays demonstrated mildly angulated distal radius and ulnar fracture with ulnar styloid fracture.  No significant displacement of the distal radius but some dorsal angulation.  She had a left distal radius fracture and was placed in a right wrist splint.  Review of Systems patient staying in a skilled facility.  Positive for vertebral fracture, osteoporosis, major depressive disorder, chronic kidney disease, hyperlipidemia, peripheral neuropathy, diabetes, venous insufficiency, dementia, history of falls, left distal radius and ulnar styloid fracture 06/18/2018 otherwise negative as it pertains to HPI.   Objective: Vital Signs: BP (!) 163/95   Pulse 70   Ht 5\' 6"  (1.676 m)   Wt 164 lb (74.4 kg)   BMI 26.47 kg/m   Physical Exam  Constitutional: She is oriented to person,  place, and time. She appears well-developed.  HENT:  Head: Normocephalic.  Right Ear: External ear normal.  Left Ear: External ear normal.  Eyes: Pupils are equal, round, and reactive to light.  Neck: No tracheal deviation present. No thyromegaly present.  Cardiovascular: Normal rate.  Pulmonary/Chest: Effort normal.  Abdominal: Soft.  Neurological: She is alert and oriented to person, place, and time.  Skin: Skin is warm and dry.  Psychiatric:  Patient had a wrist splint on and was sleeping with chin to chest.  Minimally conversant due to dementia.    Ortho Exam mild hand swelling this is a closed fracture.  Splint was removed and she had a right wrist splint applied to the left hand put on in the reverse.  Appropriate splint was applied.  I reviewed the x-rays with her daughters.  She will not require operative intervention and I plan to recheck her in 4 weeks.  Scription written to go to the skilled nursing facility to leave the splint on.  Ice and elevation for several days to help with pain.  She has tramadol ordered for pain.  Return 4 weeks for 2 view x-rays distal radius out of her splint.  Specialty Comments:  No specialty comments available.  Imaging:CLINICAL DATA:  Unwitnessed fall. Left wrist pain. Initial encounter.  EXAM: LEFT WRIST - COMPLETE 3+ VIEW  COMPARISON:  None.  FINDINGS: The bones are diffusely  osteopenic. There is a comminuted, impacted fracture of the distal radius with extension to the distal articular surface and with dorsal angulation. There is also a mildly displaced ulnar styloid fracture. There is no dislocation. Mild-to-moderate degenerative changes are noted at the first [facility] joint. Soft tissue swelling is noted about the wrist.  IMPRESSION: Distal radius and ulnar fractures.   Electronically Signed   By: Logan Bores M.D.   On: 06/18/2018 08:38     PMFS History: Patient Active Problem List   Diagnosis Date Noted  . Long term  current use of anticoagulant therapy 12/06/2015  . Hypoglycemia 10/27/2015  . MDD (major depressive disorder), recurrent severe, without psychosis ([place]) 10/26/2015  . Dementia with behavioral disturbance ([place]) 10/26/2015  . Chronic kidney disease (CKD), stage IV (severe) ([place])   . Suicidal overdose ([place]) 10/24/2015  . Depression 10/24/2015  . Other acute pulmonary embolism without acute cor pulmonale ([place]) 08/31/2015  . Long term (current) use of anticoagulants [Z79.01] 08/31/2015  . Pulmonary embolism ([place]) 08/23/2015  . Shortness of breath 08/22/2015  . Dyspnea 08/17/2015  . DOE (dyspnea on exertion) 08/17/2015  . GERD (gastroesophageal reflux disease) 02/23/2014  . Peripheral edema 02/23/2014  . GI bleed 04/05/2013  . Atrial fibrillation ([place]) 02/24/2013  . HTN (hypertension) 12/26/2010  . Chronic kidney disease (CKD) stage G3b/A2, moderately decreased glomerular filtration rate (GFR) between 30-44 mL/min/1.73 square meter and albuminuria creatinine ratio between 30-299 mg/g ([facility]) 12/26/2010  . Hyperlipemia 12/26/2010  . Peripheral neuropathy 12/26/2010  . Anemia 12/26/2010  . Diabetes ([place]) 12/26/2010  . Vertebral fracture 12/26/2010  . Venous insufficiency 12/26/2010  . Osteoporosis 12/26/2010  . Thyromegaly 12/26/2010   Past Medical History:  Diagnosis Date  . Anemia   . Atrial fibrillation ([place])   . Chronic kidney disease   . Diabetes mellitus without complication ([place])   . DVT (deep venous thrombosis) ([place])   . Hyperlipidemia   . Hypertension   . PE (pulmonary embolism)     Family History  Problem Relation Age of Onset  . Colon cancer Neg Hx     Past Surgical History:  Procedure Laterality Date  . ABDOMINAL HYSTERECTOMY    . bladder tack    . CHOLECYSTECTOMY    . COLONOSCOPY N/A 05/03/2013   [id number] MOST LIKELY DUE TO ONE AVM in the ascending colon/Moderate diverticulosis hroughout the entire examined colon/Two POLYPS REMOVED/Small internal hemorrhoids  .  ESOPHAGOGASTRODUODENOSCOPY N/A 04/06/2013   Procedure: ESOPHAGOGASTRODUODENOSCOPY (EGD);  Surgeon: Danie Binder, MD;  Location: [facility];  Service: Endoscopy;  Laterality: N/A;  8:45  . GIVENS CAPSULE STUDY  04/06/2013   Procedure: GIVENS CAPSULE STUDY;  Surgeon: Danie Binder, MD;  Location: [facility];  Service: Endoscopy;;   Social History   Occupational History  . Not on file  Tobacco Use  . Smoking status: Never Smoker  . Smokeless tobacco: Never Used  Substance and Sexual Activity  . Alcohol use: No  . Drug use: No  . Sexual activity: Not on file

## 2018-06-29 DIAGNOSIS — Z7901 Long term (current) use of anticoagulants: Secondary | ICD-10-CM | POA: Diagnosis not present

## 2018-07-06 DIAGNOSIS — I482 Chronic atrial fibrillation, unspecified: Secondary | ICD-10-CM | POA: Diagnosis not present

## 2018-07-06 DIAGNOSIS — Z7901 Long term (current) use of anticoagulants: Secondary | ICD-10-CM | POA: Diagnosis not present

## 2018-07-08 DIAGNOSIS — E785 Hyperlipidemia, unspecified: Secondary | ICD-10-CM | POA: Diagnosis not present

## 2018-07-08 DIAGNOSIS — I82409 Acute embolism and thrombosis of unspecified deep veins of unspecified lower extremity: Secondary | ICD-10-CM | POA: Diagnosis not present

## 2018-07-08 DIAGNOSIS — K219 Gastro-esophageal reflux disease without esophagitis: Secondary | ICD-10-CM | POA: Diagnosis not present

## 2018-07-08 DIAGNOSIS — N189 Chronic kidney disease, unspecified: Secondary | ICD-10-CM | POA: Diagnosis not present

## 2018-07-08 DIAGNOSIS — I2690 Septic pulmonary embolism without acute cor pulmonale: Secondary | ICD-10-CM | POA: Diagnosis not present

## 2018-07-08 DIAGNOSIS — I10 Essential (primary) hypertension: Secondary | ICD-10-CM | POA: Diagnosis not present

## 2018-07-08 DIAGNOSIS — I4891 Unspecified atrial fibrillation: Secondary | ICD-10-CM | POA: Diagnosis not present

## 2018-07-08 DIAGNOSIS — E119 Type 2 diabetes mellitus without complications: Secondary | ICD-10-CM | POA: Diagnosis not present

## 2018-07-08 DIAGNOSIS — D649 Anemia, unspecified: Secondary | ICD-10-CM | POA: Diagnosis not present

## 2018-07-08 DIAGNOSIS — F0390 Unspecified dementia without behavioral disturbance: Secondary | ICD-10-CM | POA: Diagnosis not present

## 2018-07-08 DIAGNOSIS — K922 Gastrointestinal hemorrhage, unspecified: Secondary | ICD-10-CM | POA: Diagnosis not present

## 2018-07-08 DIAGNOSIS — M810 Age-related osteoporosis without current pathological fracture: Secondary | ICD-10-CM | POA: Diagnosis not present

## 2018-07-08 DIAGNOSIS — F330 Major depressive disorder, recurrent, mild: Secondary | ICD-10-CM | POA: Diagnosis not present

## 2018-07-10 ENCOUNTER — Emergency Department (HOSPITAL_COMMUNITY): Payer: Medicare Other

## 2018-07-10 ENCOUNTER — Inpatient Hospital Stay (HOSPITAL_COMMUNITY): Payer: Medicare Other

## 2018-07-10 ENCOUNTER — Other Ambulatory Visit: Payer: Self-pay

## 2018-07-10 ENCOUNTER — Inpatient Hospital Stay (HOSPITAL_COMMUNITY)
Admission: EM | Admit: 2018-07-10 | Discharge: 2018-07-15 | DRG: 871 | Disposition: A | Discharge status: Skilled Nursing Facility | Payer: Medicare Other | Source: Skilled Nursing Facility | Attending: Internal Medicine | Admitting: Internal Medicine

## 2018-07-10 ENCOUNTER — Encounter (HOSPITAL_COMMUNITY): Payer: Self-pay

## 2018-07-10 DIAGNOSIS — Z9049 Acquired absence of other specified parts of digestive tract: Secondary | ICD-10-CM

## 2018-07-10 DIAGNOSIS — E785 Hyperlipidemia, unspecified: Secondary | ICD-10-CM | POA: Diagnosis present

## 2018-07-10 DIAGNOSIS — F0390 Unspecified dementia without behavioral disturbance: Secondary | ICD-10-CM | POA: Diagnosis not present

## 2018-07-10 DIAGNOSIS — N189 Chronic kidney disease, unspecified: Secondary | ICD-10-CM | POA: Diagnosis not present

## 2018-07-10 DIAGNOSIS — R159 Full incontinence of feces: Secondary | ICD-10-CM | POA: Diagnosis not present

## 2018-07-10 DIAGNOSIS — Z7984 Long term (current) use of oral hypoglycemic drugs: Secondary | ICD-10-CM

## 2018-07-10 DIAGNOSIS — I82409 Acute embolism and thrombosis of unspecified deep veins of unspecified lower extremity: Secondary | ICD-10-CM | POA: Diagnosis not present

## 2018-07-10 DIAGNOSIS — E1165 Type 2 diabetes mellitus with hyperglycemia: Secondary | ICD-10-CM | POA: Diagnosis present

## 2018-07-10 DIAGNOSIS — Z515 Encounter for palliative care: Secondary | ICD-10-CM | POA: Diagnosis not present

## 2018-07-10 DIAGNOSIS — Z880 Allergy status to penicillin: Secondary | ICD-10-CM | POA: Diagnosis not present

## 2018-07-10 DIAGNOSIS — R251 Tremor, unspecified: Secondary | ICD-10-CM | POA: Diagnosis present

## 2018-07-10 DIAGNOSIS — Z90710 Acquired absence of both cervix and uterus: Secondary | ICD-10-CM | POA: Diagnosis not present

## 2018-07-10 DIAGNOSIS — R918 Other nonspecific abnormal finding of lung field: Secondary | ICD-10-CM | POA: Diagnosis not present

## 2018-07-10 DIAGNOSIS — L89152 Pressure ulcer of sacral region, stage 2: Secondary | ICD-10-CM | POA: Diagnosis present

## 2018-07-10 DIAGNOSIS — D631 Anemia in chronic kidney disease: Secondary | ICD-10-CM | POA: Diagnosis present

## 2018-07-10 DIAGNOSIS — Z87898 Personal history of other specified conditions: Secondary | ICD-10-CM

## 2018-07-10 DIAGNOSIS — N3942 Incontinence without sensory awareness: Secondary | ICD-10-CM | POA: Diagnosis not present

## 2018-07-10 DIAGNOSIS — I4891 Unspecified atrial fibrillation: Secondary | ICD-10-CM | POA: Diagnosis present

## 2018-07-10 DIAGNOSIS — Z86711 Personal history of pulmonary embolism: Secondary | ICD-10-CM | POA: Diagnosis not present

## 2018-07-10 DIAGNOSIS — E1140 Type 2 diabetes mellitus with diabetic neuropathy, unspecified: Secondary | ICD-10-CM | POA: Diagnosis not present

## 2018-07-10 DIAGNOSIS — I129 Hypertensive chronic kidney disease with stage 1 through stage 4 chronic kidney disease, or unspecified chronic kidney disease: Secondary | ICD-10-CM | POA: Diagnosis present

## 2018-07-10 DIAGNOSIS — R1312 Dysphagia, oropharyngeal phase: Secondary | ICD-10-CM | POA: Diagnosis present

## 2018-07-10 DIAGNOSIS — Z881 Allergy status to other antibiotic agents status: Secondary | ICD-10-CM

## 2018-07-10 DIAGNOSIS — D649 Anemia, unspecified: Secondary | ICD-10-CM | POA: Diagnosis present

## 2018-07-10 DIAGNOSIS — R29810 Facial weakness: Secondary | ICD-10-CM | POA: Diagnosis present

## 2018-07-10 DIAGNOSIS — M2550 Pain in unspecified joint: Secondary | ICD-10-CM | POA: Diagnosis not present

## 2018-07-10 DIAGNOSIS — R29712 NIHSS score 12: Secondary | ICD-10-CM | POA: Diagnosis present

## 2018-07-10 DIAGNOSIS — Z86718 Personal history of other venous thrombosis and embolism: Secondary | ICD-10-CM

## 2018-07-10 DIAGNOSIS — R791 Abnormal coagulation profile: Secondary | ICD-10-CM | POA: Diagnosis present

## 2018-07-10 DIAGNOSIS — L8990 Pressure ulcer of unspecified site, unspecified stage: Secondary | ICD-10-CM

## 2018-07-10 DIAGNOSIS — Z888 Allergy status to other drugs, medicaments and biological substances status: Secondary | ICD-10-CM

## 2018-07-10 DIAGNOSIS — I10 Essential (primary) hypertension: Secondary | ICD-10-CM | POA: Diagnosis not present

## 2018-07-10 DIAGNOSIS — F330 Major depressive disorder, recurrent, mild: Secondary | ICD-10-CM | POA: Diagnosis not present

## 2018-07-10 DIAGNOSIS — M810 Age-related osteoporosis without current pathological fracture: Secondary | ICD-10-CM | POA: Diagnosis not present

## 2018-07-10 DIAGNOSIS — J690 Pneumonitis due to inhalation of food and vomit: Secondary | ICD-10-CM | POA: Diagnosis present

## 2018-07-10 DIAGNOSIS — I619 Nontraumatic intracerebral hemorrhage, unspecified: Secondary | ICD-10-CM

## 2018-07-10 DIAGNOSIS — Z9181 History of falling: Secondary | ICD-10-CM

## 2018-07-10 DIAGNOSIS — I2690 Septic pulmonary embolism without acute cor pulmonale: Secondary | ICD-10-CM | POA: Diagnosis not present

## 2018-07-10 DIAGNOSIS — R011 Cardiac murmur, unspecified: Secondary | ICD-10-CM | POA: Diagnosis present

## 2018-07-10 DIAGNOSIS — E1122 Type 2 diabetes mellitus with diabetic chronic kidney disease: Secondary | ICD-10-CM | POA: Diagnosis present

## 2018-07-10 DIAGNOSIS — K922 Gastrointestinal hemorrhage, unspecified: Secondary | ICD-10-CM | POA: Diagnosis not present

## 2018-07-10 DIAGNOSIS — A419 Sepsis, unspecified organism: Principal | ICD-10-CM | POA: Diagnosis present

## 2018-07-10 DIAGNOSIS — I63411 Cerebral infarction due to embolism of right middle cerebral artery: Secondary | ICD-10-CM | POA: Diagnosis present

## 2018-07-10 DIAGNOSIS — Z79899 Other long term (current) drug therapy: Secondary | ICD-10-CM

## 2018-07-10 DIAGNOSIS — I482 Chronic atrial fibrillation, unspecified: Secondary | ICD-10-CM | POA: Diagnosis present

## 2018-07-10 DIAGNOSIS — K219 Gastro-esophageal reflux disease without esophagitis: Secondary | ICD-10-CM | POA: Diagnosis not present

## 2018-07-10 DIAGNOSIS — E876 Hypokalemia: Secondary | ICD-10-CM | POA: Diagnosis present

## 2018-07-10 DIAGNOSIS — G9341 Metabolic encephalopathy: Secondary | ICD-10-CM | POA: Diagnosis present

## 2018-07-10 DIAGNOSIS — E440 Moderate protein-calorie malnutrition: Secondary | ICD-10-CM

## 2018-07-10 DIAGNOSIS — R296 Repeated falls: Secondary | ICD-10-CM | POA: Diagnosis not present

## 2018-07-10 DIAGNOSIS — N184 Chronic kidney disease, stage 4 (severe): Secondary | ICD-10-CM | POA: Diagnosis present

## 2018-07-10 DIAGNOSIS — Z792 Long term (current) use of antibiotics: Secondary | ICD-10-CM

## 2018-07-10 DIAGNOSIS — D518 Other vitamin B12 deficiency anemias: Secondary | ICD-10-CM | POA: Diagnosis not present

## 2018-07-10 DIAGNOSIS — D513 Other dietary vitamin B12 deficiency anemia: Secondary | ICD-10-CM | POA: Diagnosis present

## 2018-07-10 DIAGNOSIS — Z7401 Bed confinement status: Secondary | ICD-10-CM | POA: Diagnosis not present

## 2018-07-10 DIAGNOSIS — R4781 Slurred speech: Secondary | ICD-10-CM | POA: Diagnosis not present

## 2018-07-10 DIAGNOSIS — E119 Type 2 diabetes mellitus without complications: Secondary | ICD-10-CM

## 2018-07-10 DIAGNOSIS — R1084 Generalized abdominal pain: Secondary | ICD-10-CM | POA: Diagnosis not present

## 2018-07-10 DIAGNOSIS — I639 Cerebral infarction, unspecified: Secondary | ICD-10-CM | POA: Diagnosis not present

## 2018-07-10 DIAGNOSIS — I4821 Permanent atrial fibrillation: Secondary | ICD-10-CM | POA: Diagnosis not present

## 2018-07-10 DIAGNOSIS — I081 Rheumatic disorders of both mitral and tricuspid valves: Secondary | ICD-10-CM | POA: Diagnosis present

## 2018-07-10 DIAGNOSIS — I63512 Cerebral infarction due to unspecified occlusion or stenosis of left middle cerebral artery: Secondary | ICD-10-CM | POA: Diagnosis not present

## 2018-07-10 DIAGNOSIS — E872 Acidosis: Secondary | ICD-10-CM | POA: Diagnosis present

## 2018-07-10 DIAGNOSIS — I6523 Occlusion and stenosis of bilateral carotid arteries: Secondary | ICD-10-CM | POA: Diagnosis present

## 2018-07-10 DIAGNOSIS — Z741 Need for assistance with personal care: Secondary | ICD-10-CM | POA: Diagnosis not present

## 2018-07-10 DIAGNOSIS — I69391 Dysphagia following cerebral infarction: Secondary | ICD-10-CM | POA: Diagnosis not present

## 2018-07-10 DIAGNOSIS — Z66 Do not resuscitate: Secondary | ICD-10-CM | POA: Diagnosis present

## 2018-07-10 DIAGNOSIS — Z7901 Long term (current) use of anticoagulants: Secondary | ICD-10-CM

## 2018-07-10 DIAGNOSIS — I63511 Cerebral infarction due to unspecified occlusion or stenosis of right middle cerebral artery: Secondary | ICD-10-CM | POA: Diagnosis not present

## 2018-07-10 DIAGNOSIS — Z79891 Long term (current) use of opiate analgesic: Secondary | ICD-10-CM

## 2018-07-10 LAB — I-STAT CHEM 8, ED
BUN: 24 mg/dL — ABNORMAL HIGH (ref 8–23)
CALCIUM ION: 1.23 mmol/L (ref 1.15–1.40)
CHLORIDE: 102 mmol/L (ref 98–111)
CREATININE: 1.5 mg/dL — AB (ref 0.44–1.00)
GLUCOSE: 263 mg/dL — AB (ref 70–99)
HCT: 33 % — ABNORMAL LOW (ref 36.0–46.0)
Hemoglobin: 11.2 g/dL — ABNORMAL LOW (ref 12.0–15.0)
POTASSIUM: 4 mmol/L (ref 3.5–5.1)
Sodium: 137 mmol/L (ref 135–145)
TCO2: 23 mmol/L (ref 22–32)

## 2018-07-10 LAB — COMPREHENSIVE METABOLIC PANEL
ALK PHOS: 139 U/L — AB (ref 38–126)
ALT: 15 U/L (ref 0–44)
ANION GAP: 9 (ref 5–15)
AST: 25 U/L (ref 15–41)
Albumin: 3.4 g/dL — ABNORMAL LOW (ref 3.5–5.0)
BUN: 24 mg/dL — ABNORMAL HIGH (ref 8–23)
CALCIUM: 9.4 mg/dL (ref 8.9–10.3)
CO2: 23 mmol/L (ref 22–32)
Chloride: 103 mmol/L (ref 98–111)
Creatinine, Ser: 1.49 mg/dL — ABNORMAL HIGH (ref 0.44–1.00)
GFR calc non Af Amer: 30 mL/min — ABNORMAL LOW (ref >60)
GFR, EST AFRICAN AMERICAN: 34 mL/min — AB (ref >60)
Glucose, Bld: 279 mg/dL — ABNORMAL HIGH (ref 70–99)
Potassium: 4 mmol/L (ref 3.5–5.1)
SODIUM: 135 mmol/L (ref 135–145)
Total Bilirubin: 1.5 mg/dL — ABNORMAL HIGH (ref 0.3–1.2)
Total Protein: 7.7 g/dL (ref 6.5–8.1)

## 2018-07-10 LAB — URINALYSIS, ROUTINE W REFLEX MICROSCOPIC
BACTERIA UA: NONE SEEN
Bilirubin Urine: NEGATIVE
Glucose, UA: 50 mg/dL — AB
KETONES UR: NEGATIVE mg/dL
LEUKOCYTES UA: NEGATIVE
NITRITE: NEGATIVE
Protein, ur: NEGATIVE mg/dL
Specific Gravity, Urine: 1.008 (ref 1.005–1.030)
pH: 6 (ref 5.0–8.0)

## 2018-07-10 LAB — CBC
HCT: 34.6 % — ABNORMAL LOW (ref 36.0–46.0)
HEMOGLOBIN: 10.9 g/dL — AB (ref 12.0–15.0)
MCH: 27.1 pg (ref 26.0–34.0)
MCHC: 31.5 g/dL (ref 30.0–36.0)
MCV: 86.1 fL (ref 80.0–100.0)
NRBC: 0 % (ref 0.0–0.2)
PLATELETS: 340 10*3/uL (ref 150–400)
RBC: 4.02 MIL/uL (ref 3.87–5.11)
RDW: 15.3 % (ref 11.5–15.5)
WBC: 11.2 10*3/uL — AB (ref 4.0–10.5)

## 2018-07-10 LAB — CBG MONITORING, ED
GLUCOSE-CAPILLARY: 256 mg/dL — AB (ref 70–99)
Glucose-Capillary: 243 mg/dL — ABNORMAL HIGH (ref 70–99)

## 2018-07-10 LAB — PROTIME-INR
INR: 1.23
Prothrombin Time: 15.4 seconds — ABNORMAL HIGH (ref 11.4–15.2)

## 2018-07-10 LAB — RAPID URINE DRUG SCREEN, HOSP PERFORMED
AMPHETAMINES: NOT DETECTED
Barbiturates: NOT DETECTED
Benzodiazepines: NOT DETECTED
Cocaine: NOT DETECTED
OPIATES: NOT DETECTED
Tetrahydrocannabinol: NOT DETECTED

## 2018-07-10 LAB — BRAIN NATRIURETIC PEPTIDE: B Natriuretic Peptide: 604 pg/mL — ABNORMAL HIGH (ref 0.0–100.0)

## 2018-07-10 LAB — LACTIC ACID, PLASMA: LACTIC ACID, VENOUS: 1.9 mmol/L (ref 0.5–1.9)

## 2018-07-10 LAB — PROCALCITONIN: Procalcitonin: <0.1 ng/mL

## 2018-07-10 LAB — DIFFERENTIAL
Abs Immature Granulocytes: 0.06 10*3/uL (ref 0.00–0.07)
BASOS ABS: 0.1 10*3/uL (ref 0.0–0.1)
Basophils Relative: 1 %
EOS PCT: 0 %
Eosinophils Absolute: 0 10*3/uL (ref 0.0–0.5)
Immature Granulocytes: 1 %
LYMPHS PCT: 7 %
Lymphs Abs: 0.8 10*3/uL (ref 0.7–4.0)
MONO ABS: 0.9 10*3/uL (ref 0.1–1.0)
Monocytes Relative: 8 %
NEUTROS PCT: 83 %
Neutro Abs: 9.4 10*3/uL — ABNORMAL HIGH (ref 1.7–7.7)

## 2018-07-10 LAB — ETHANOL

## 2018-07-10 LAB — I-STAT TROPONIN, ED: Troponin i, poc: 0.03 ng/mL (ref 0.00–0.08)

## 2018-07-10 LAB — LIPASE, BLOOD: Lipase: 32 U/L (ref 11–51)

## 2018-07-10 LAB — APTT: aPTT: 26 seconds (ref 24–36)

## 2018-07-10 MED ORDER — STROKE: EARLY STAGES OF RECOVERY BOOK
Freq: Once | Status: AC
  Administered 2018-07-11: 01:00:00
  Filled 2018-07-10: qty 1

## 2018-07-10 MED ORDER — ACETAMINOPHEN 650 MG RE SUPP
650.0000 mg | RECTAL | Status: DC | PRN
  Administered 2018-07-13: 650 mg via RECTAL
  Filled 2018-07-10: qty 1

## 2018-07-10 MED ORDER — VANCOMYCIN HCL 10 G IV SOLR
1500.0000 mg | Freq: Once | INTRAVENOUS | Status: AC
  Administered 2018-07-10: 1500 mg via INTRAVENOUS
  Filled 2018-07-10 (×2): qty 1500

## 2018-07-10 MED ORDER — VANCOMYCIN HCL IN DEXTROSE 750-5 MG/150ML-% IV SOLN
750.0000 mg | INTRAVENOUS | Status: DC
  Filled 2018-07-10 (×3): qty 150

## 2018-07-10 MED ORDER — SODIUM CHLORIDE 0.9 % IV BOLUS
500.0000 mL | Freq: Once | INTRAVENOUS | Status: AC
  Administered 2018-07-10: 500 mL via INTRAVENOUS

## 2018-07-10 MED ORDER — ACETAMINOPHEN 160 MG/5ML PO SOLN: 650.0000 mg | ORAL | Status: DC | PRN

## 2018-07-10 MED ORDER — SODIUM CHLORIDE 0.9 % IV SOLN
1.0000 g | Freq: Three times a day (TID) | INTRAVENOUS | Status: DC
  Administered 2018-07-11 – 2018-07-12 (×3): 1 g via INTRAVENOUS
  Filled 2018-07-10 (×11): qty 1

## 2018-07-10 MED ORDER — SODIUM CHLORIDE 0.9 % IV SOLN
2.0000 g | Freq: Once | INTRAVENOUS | Status: AC
  Administered 2018-07-10: 2 g via INTRAVENOUS
  Filled 2018-07-10: qty 2

## 2018-07-10 MED ORDER — LABETALOL HCL 5 MG/ML IV SOLN
10.0000 mg | Freq: Once | INTRAVENOUS | Status: AC
  Administered 2018-07-10: 10 mg via INTRAVENOUS
  Filled 2018-07-10: qty 4

## 2018-07-10 MED ORDER — FENTANYL CITRATE (PF) 100 MCG/2ML IJ SOLN
50.0000 ug | Freq: Once | INTRAMUSCULAR | Status: AC
  Administered 2018-07-10: 50 ug via INTRAVENOUS
  Filled 2018-07-10: qty 2

## 2018-07-10 MED ORDER — LABETALOL HCL 5 MG/ML IV SOLN
5.0000 mg | INTRAVENOUS | Status: DC | PRN
  Administered 2018-07-11 – 2018-07-15 (×20): 5 mg via INTRAVENOUS
  Filled 2018-07-10 (×16): qty 4

## 2018-07-10 MED ORDER — GADOBUTROL 1 MMOL/ML IV SOLN
7.0000 mL | Freq: Once | INTRAVENOUS | Status: AC | PRN
  Administered 2018-07-10: 7 mL via INTRAVENOUS

## 2018-07-10 MED ORDER — INSULIN ASPART 100 UNIT/ML ~~LOC~~ SOLN
0.0000 [IU] | Freq: Three times a day (TID) | SUBCUTANEOUS | Status: DC
  Administered 2018-07-12: 3 [IU] via SUBCUTANEOUS
  Administered 2018-07-12: 2 [IU] via SUBCUTANEOUS
  Administered 2018-07-12 – 2018-07-13 (×3): 1 [IU] via SUBCUTANEOUS
  Administered 2018-07-14: 3 [IU] via SUBCUTANEOUS
  Administered 2018-07-15 (×3): 5 [IU] via SUBCUTANEOUS

## 2018-07-10 MED ORDER — LABETALOL HCL 5 MG/ML IV SOLN
20.0000 mg | Freq: Once | INTRAVENOUS | Status: AC
  Administered 2018-07-10: 20 mg via INTRAVENOUS
  Filled 2018-07-10: qty 4

## 2018-07-10 MED ORDER — ACETAMINOPHEN 325 MG PO TABS: 650.0000 mg | ORAL_TABLET | ORAL | Status: DC | PRN

## 2018-07-10 MED ORDER — ACETAMINOPHEN 325 MG PO TABS: 650.0000 mg | ORAL_TABLET | Freq: Once | ORAL | Status: DC

## 2018-07-10 MED ORDER — VANCOMYCIN HCL IN DEXTROSE 1-5 GM/200ML-% IV SOLN: 1000.0000 mg | Freq: Once | INTRAVENOUS | Status: DC

## 2018-07-10 MED ORDER — METRONIDAZOLE IN NACL 5-0.79 MG/ML-% IV SOLN
500.0000 mg | Freq: Three times a day (TID) | INTRAVENOUS | Status: DC
  Administered 2018-07-10 – 2018-07-14 (×11): 500 mg via INTRAVENOUS
  Filled 2018-07-10 (×12): qty 100

## 2018-07-10 MED ORDER — ACETAMINOPHEN 650 MG RE SUPP
650.0000 mg | Freq: Once | RECTAL | Status: AC
  Administered 2018-07-10: 650 mg via RECTAL
  Filled 2018-07-10: qty 1

## 2018-07-10 NOTE — ED Notes (Signed)
AC notified for vancomycin.

## 2018-07-10 NOTE — ED Notes (Signed)
Family arrived and is at bedside.  

## 2018-07-10 NOTE — ED Notes (Signed)
Patient transported to MRI 

## 2018-07-10 NOTE — ED Notes (Signed)
MRI requested pt be unhooked from IV. Phlebotomy only able to obtain one set of blood cultures. Will notify phlebotomy upon return of pt to draw second set. Will start abx when second set is obtained.

## 2018-07-10 NOTE — ED Notes (Signed)
Patient transported to CT 

## 2018-07-10 NOTE — ED Triage Notes (Addendum)
Pt brought by EMS from [place]. EMS reports that pt experienced left sided facial droop and slurred speech yesterday. Per EMS approx 22 hrs ago. Daughter refused transport at that time. Today pt began having tremors in extremities and daughter consent to transport . Pt noted to have left sided facial droop and slurred speech. Left sided weakness noted

## 2018-07-10 NOTE — H&P (Addendum)
History and Physical    Julie Carpenter JQB:[id number] DOB: Jul 14, 1926 DOA: 07/10/2018  PCP: Vidal Schwalbe, MD Patient coming from: Nursing home  Chief Complaint: Tremors  HPI: Julie Carpenter is a 84 y.o. female with medical history significant of atrial fibrillation, CKD, type 2 diabetes, hypertension, hyperlipidemia, prior VTE presenting to the hospital from her nursing home via EMS for evaluation of tremors.  Patient was very somnolent and no history could be obtained from her.  Daughter at bedside states patient is awake, alert, and oriented to person and place at baseline.  States she was talking to family in the emergency room earlier but became somnolent after receiving a pain medication.  Daughter states that she went to the patient's nursing home this morning and became concerned after she noticed that she had left-sided facial droop and was not able to walk to the bathroom.  Daughter is not aware of the patient having any fevers, chills, pain, nausea, vomiting, or diarrhea.  States she had a normal bowel movement yesterday.  ED Course: Temperature 101.2 F.  Slightly tachycardic and tachypneic.  Blood pressure 165/109 on arrival.  White count 40.9 with neutrophilic predominance.  Lactic acid normal.  BNP 604.  UA not suggestive of infection.  CBG 256.  Blood ethanol level negative.  UDS negative.  I-STAT troponin negative.  Chest x-ray showing diffusely increased reticular opacities which may represent interstitial edema or atypical pneumonia.  MRI brain showing acute infarct in the right MCA territory, hemorrhagic infarct in the right frontal operculum, and nonhemorrhagic infarct in the right parietal operculum.  MRI negative for abscess.  Patient received IV labetalol total 30 mg in the ED and was started on vancomycin, metronidazole, and aztreonam.  Received a total of 1 L normal saline bolus.  ED physician discussed the case with Dr. Leonel Ramsay who recommended keeping the systolic blood  pressure below 160 and transferring the patient to [facility].  Neurology will consult once the patient is there.  Review of Systems: As per HPI otherwise 10 point review of systems negative.  Past Medical History:  Diagnosis Date  . Anemia   . Atrial fibrillation ([place])   . Chronic kidney disease   . Diabetes mellitus without complication ([place])   . DVT (deep venous thrombosis) ([place])   . Hyperlipidemia   . Hypertension   . PE (pulmonary embolism)     Past Surgical History:  Procedure Laterality Date  . ABDOMINAL HYSTERECTOMY    . bladder tack    . CHOLECYSTECTOMY    . COLONOSCOPY N/A 05/03/2013   BDZ:HGDJME MOST LIKELY DUE TO ONE AVM in the ascending colon/Moderate diverticulosis hroughout the entire examined colon/Two POLYPS REMOVED/Small internal hemorrhoids  . ESOPHAGOGASTRODUODENOSCOPY N/A 04/06/2013   Procedure: ESOPHAGOGASTRODUODENOSCOPY (EGD);  Surgeon: Danie Binder, MD;  Location: [facility];  Service: Endoscopy;  Laterality: N/A;  8:45  . GIVENS CAPSULE STUDY  04/06/2013   Procedure: GIVENS CAPSULE STUDY;  Surgeon: Danie Binder, MD;  Location: [facility];  Service: Endoscopy;;     reports that she has never smoked. She has never used smokeless tobacco. She reports that she does not drink alcohol or use drugs.  Allergies  Allergen Reactions  . Penicillins Anaphylaxis    Has patient had a PCN reaction causing immediate rash, facial/tongue/throat swelling, SOB or lightheadedness with hypotension: Yes Has patient had a PCN reaction causing severe rash involving mucus membranes or skin necrosis: No Has patient had a PCN reaction that required hospitalization No  Has patient had a PCN reaction occurring within the last 10 years: No If all of the above answers are "NO", then may proceed with Cephalosporin use.  Marland Kitchen Benicar [Olmesartan Medoxomil] Swelling    Lips swelling  . Ciprofloxacin Swelling  . Hctz [Hydrochlorothiazide] Swelling    Lips swelling  . Januvia  [Sitagliptin Phosphate] Swelling    Lips swelling  . Norvasc [Amlodipine Besylate] Swelling    Lips swelling  . Ramipril Swelling    Lips swelling    Family History  Problem Relation Age of Onset  . Colon cancer Neg Hx     Prior to Admission medications   Medication Sig Start Date End Date Taking? Authorizing Provider  azithromycin (ZITHROMAX) 500 MG tablet Take by mouth daily.    [provider]  calcitRIOL (ROCALTROL) 0.25 MCG capsule Take 0.25 mcg by mouth 3 (three) times a week. Tuesdays,THursdays, and Saturdays 07/11/15   [provider]  carvedilol (COREG) 12.5 MG tablet Take 1 tablet (12.5 mg total) by mouth 2 (two) times daily with a meal. 08/18/15   Elgergawy, Silver Huguenin, MD  Cholecalciferol 2000 units TABS Take 2 tablets by mouth daily.    [provider]  ciprofloxacin (CIPRO) 500 MG tablet Take 1 tablet (500 mg total) by mouth 2 (two) times daily. Patient not taking: Reported on 06/18/2018 12/02/17   Fredia Sorrow, MD  epoetin alfa (EPOGEN,PROCRIT) 2000 UNIT/ML injection Inject 2,000 Units into the vein every 21 ( twenty-one) days. Reported on 11/08/2015    [provider]  fluticasone (FLONASE) 50 MCG/ACT nasal spray Place 1 spray into both nostrils daily.    [provider]  furosemide (LASIX) 40 MG tablet Take 40 mg by mouth every morning.    [provider]  glimepiride (AMARYL) 1 MG tablet Take 1 tablet (1 mg total) by mouth daily with breakfast. Patient taking differently: Take 2 mg by mouth daily with breakfast.  01/21/16   Isla Pence, MD  montelukast (SINGULAIR) 10 MG tablet Take 10 mg by mouth daily.    [provider]  Psyllium 400 MG CAPS Take 400 mg by mouth daily.    [provider]  senna (SENOKOT) 8.6 MG TABS tablet Take 1 tablet by mouth daily as needed for mild constipation or moderate constipation.     [provider]  sodium bicarbonate 325 MG tablet Take 325 mg by mouth 2  (two) times daily.     [provider]  traMADol (ULTRAM) 50 MG tablet Take 1 tablet (50 mg total) by mouth every 6 (six) hours as needed. 06/18/18   Milton Ferguson, MD  warfarin (COUMADIN) 2 MG tablet Take 17m on thursdays.  Take 350mall other days. 01/24/16   EcCherre RobinsPharmD    Physical Exam: Vitals:   07/10/18 1730 07/10/18 1745 07/10/18 1800 07/10/18 1815  BP: (!) 147/79 (!) 146/88 137/86 (!) 156/103  Pulse: 88 81 88 (!) 101  Resp: (!) 21 17 (!) 25 18  Temp:      TempSrc:      SpO2: 95% 96% 96% 96%  Weight:       Physical Exam  Constitutional:  Frail elderly female  HENT:  Dry mucous membranes  Eyes: Right eye exhibits no discharge. Left eye exhibits no discharge.  Neck: Neck supple. No tracheal deviation present.  Cardiovascular: Normal rate, regular rhythm and intact distal pulses.  Pulmonary/Chest: Effort normal and breath sounds normal. No respiratory distress. She has no wheezes. She has no rales.  Abdominal: Soft. Bowel sounds are normal. She exhibits no distension. There is tenderness. There is guarding. There is no rebound.  Generalized tenderness and guarding on exam  Musculoskeletal: She exhibits no edema.  Neurological:  Very somnolent Opens eyes on command Oriented to self only. Moving right upper and lower extremities on command.  Not able to move left upper and lower extremities.  Skin: Skin is warm and dry. She is not diaphoretic.     Labs on Admission: I have personally reviewed following labs and imaging studies  CBC: Recent Labs  Lab 07/10/18 1352 07/10/18 1403  WBC 11.2*  --   NEUTROABS 9.4*  --   HGB 10.9* 11.2*  HCT 34.6* 33.0*  MCV 86.1  --   PLT 340  --    Basic Metabolic Panel: Recent Labs  Lab 07/10/18 1352 07/10/18 1403  NA 135 137  K 4.0 4.0  CL 103 102  CO2 23  --   GLUCOSE 279* 263*  BUN 24* 24*  CREATININE 1.49* 1.50*  CALCIUM 9.4  --    GFR: Estimated Creatinine Clearance: 25.7 mL/min (A) (by C-G  formula based on SCr of 1.5 mg/dL (H)). Liver Function Tests: Recent Labs  Lab 07/10/18 1352  AST 25  ALT 15  ALKPHOS 139*  BILITOT 1.5*  PROT 7.7  ALBUMIN 3.4*   Recent Labs  Lab 07/10/18 1515  LIPASE 32   No results for input(s): AMMONIA in the last 168 hours. Coagulation Profile: Recent Labs  Lab 07/10/18 1352  INR 1.23   Cardiac Enzymes: No results for input(s): CKTOTAL, CKMB, CKMBINDEX, TROPONINI in the last 168 hours. BNP (last 3 results) No results for input(s): PROBNP in the last 8760 hours. HbA1C: No results for input(s): HGBA1C in the last 72 hours. CBG: Recent Labs  Lab 07/10/18 1341  GLUCAP 256*   Lipid Profile: No results for input(s): CHOL, HDL, LDLCALC, TRIG, CHOLHDL, LDLDIRECT in the last 72 hours. Thyroid Function Tests: No results for input(s): TSH, T4TOTAL, FREET4, T3FREE, THYROIDAB in the last 72 hours. Anemia Panel: No results for input(s): VITAMINB12, FOLATE, FERRITIN, TIBC, IRON, RETICCTPCT in the last 72 hours. Urine analysis:    Component Value Date/Time   COLORURINE YELLOW 07/10/2018 1331   APPEARANCEUR CLOUDY (A) 07/10/2018 1331   LABSPEC 1.008 07/10/2018 1331   PHURINE 6.0 07/10/2018 1331   GLUCOSEU 50 (A) 07/10/2018 1331   HGBUR SMALL (A) 07/10/2018 1331   BILIRUBINUR NEGATIVE 07/10/2018 1331   KETONESUR NEGATIVE 07/10/2018 1331   PROTEINUR NEGATIVE 07/10/2018 1331   NITRITE NEGATIVE 07/10/2018 1331   LEUKOCYTESUR NEGATIVE 07/10/2018 1331    Radiological Exams on Admission: Ct Head Wo Contrast  Result Date: 07/10/2018 CLINICAL DATA:  Left-sided weakness and slurred speech EXAM: CT HEAD WITHOUT CONTRAST TECHNIQUE: Contiguous axial images were obtained from the base of the skull through the vertex without intravenous contrast. COMPARISON:  June 18, 2018 FINDINGS: Brain: There is mild diffuse atrophy stable. There is no intracranial mass, hemorrhage, extra-axial fluid collection, or midline shift. There is new cytotoxic edema  involving the right frontal-temporal junction which includes portions of the lateral anterior right lentiform nucleus, claustrum, anterior aspect of the right extreme capsule, and a portion of the right insular cortex. Brain parenchyma in the nearby periphery of the right posterior frontal and anterior temporal lobe regions involved as well with this apparent acute infarct. Elsewhere, there is evidence of a prior infarct in the mid left frontal lobe. There is evidence of a prior infarct in the  medial right occipital lobe. There is also evidence of a prior infarct involving a portion of the anteromedial left occipital lobe. There is periventricular small vessel disease in the centra semiovale bilaterally. Vascular: No evident hyperdense vessel. There is calcification in each carotid siphon region. Skull: Bony calvarium appears intact. Sinuses/Orbits: There is opacification of the left sphenoid sinus with mild calcification in this area, stable. There is mucosal thickening in several ethmoid air cells. There is incomplete visualization of a retention cyst in the posterior right maxillary antrum. Orbits appear symmetric bilaterally. Other: Mastoid air cells are clear. IMPRESSION: 1. Apparent acute infarct with cytotoxic edema at the right posterior frontal-anterior temporal junction with involvement of portions of the anterolateral right basal ganglia, claustrum, extreme capsule, and insular cortex as well as brain parenchyma in posterior right frontal and anterior right temporal lobes. 2.  No mass or hemorrhage evident.  Underlying atrophy is stable. 3. Prior bilateral occipital lobe and left frontal lobe infarcts noted. There is periventricular small vessel disease. 4.  Foci of arterial vascular calcification noted. 5.  Multifocal paranasal sinus disease. Electronically Signed   By: Lowella Grip III M.D.   On: 07/10/2018 14:29   Mr Jeri Cos And Wo Contrast  Result Date: 07/10/2018 CLINICAL DATA:  Rule out  brain abscess. Left-sided facial droop and slurred speech. EXAM: MRI HEAD WITHOUT AND WITH CONTRAST TECHNIQUE: Multiplanar, multiecho pulse sequences of the brain and surrounding structures were obtained without and with intravenous contrast. CONTRAST:  7 mL Gadovist IV COMPARISON:  CT head 07/10/2018 FINDINGS: Brain: Image quality degraded by motion Acute hemorrhagic infarct in the right frontal operculum extending into the anterior insula. Acute infarct also present in the parietal operculum extending into the frontal parietal white matter. Generalized atrophy. Chronic infarct left frontal lobe. Mild chronic microvascular ischemia. Normal enhancement.  Negative for abscess or mass. Vascular: Normal arterial flow voids Skull and upper cervical spine: Negative Sinuses/Orbits: Mild mucosal edema paranasal sinuses. Bilateral cataract surgery Other: None IMPRESSION: Acute infarct right MCA territory. Hemorrhagic infarct right frontal operculum. Nonhemorrhagic infarct in the right parietal operculum. Negative for abscess. Electronically Signed   By: Franchot Gallo M.D.   On: 07/10/2018 16:14   Dg Chest Portable 1 View  Result Date: 07/10/2018 CLINICAL DATA:  84 y/o  F; fever. EXAM: PORTABLE CHEST 1 VIEW COMPARISON:  06/18/2018 chest radiograph FINDINGS: Normal cardiac silhouette. Aortic atherosclerosis with calcification. Diffusely increased reticular opacities. No focal consolidation. No pleural effusion or pneumothorax. No acute osseous abnormality is evident. IMPRESSION: Diffusely increased reticular opacities may represent interstitial edema or atypical pneumonia. No consolidation. Electronically Signed   By: Kristine Garbe M.D.   On: 07/10/2018 15:05    EKG: Independently reviewed.  Atrial fibrillation with heart rate 96.  Very poor quality EKG with a lot of artifact making it difficult to read.  Assessment/Plan Principal Problem:   Hemorrhagic stroke [facility]) Active Problems:   HTN  (hypertension)   Hyperlipemia   Anemia   Atrial fibrillation (H[facility])   CKD (chronic kidney disease) stage 4, GFR 15-29 ml/min ([facility])   Sepsis (H[facility])   Aspiration pneumonia ([facility])   Type 2 diabetes mellitus ([facility])   Pressure injury of skin   Acute hemorrhagic stroke Per EMS, nursing home staff reported that patient had left-sided facial droop and left-sided weakness since yesterday.  MRI brain showing acute infarct in the right MCA territory, hemorrhagic infarct in the right frontal operculum, and nonhemorrhagic infarct in the right parietal operculum.  Noted to have left-sided  deficits on exam.  ED physician discussed the case with Dr. Leonel Ramsay who recommended keeping the systolic blood pressure below 160 and transferring the patient to [facility].  Neurology will consult once the patient is there. -Monitor in stepdown -Hold aspirin in the setting of hemorrhagic infarct -A1c, lipid panel -Carotid Dopplers -Echo -IV labetalol 5 mg prn SBP >160 -PT/OT/SLP  -Keep n.p.o. -Aspiration precautions; seizure precautions  Sepsis secondary to suspected aspiration pneumonia Temperature 101.2 F.  Slightly tachycardic and tachypneic.  Not hypotensive. White count 69.6 with neutrophilic predominance.  Lactic acid normal.  UA not suggestive of infection. Chest x-ray showing diffusely increased reticular opacities which may represent interstitial edema or atypical pneumonia.  MRI brain negative for abscess.  Patient noted to have generalized abdominal tenderness and guarding on exam. Alk phos 139, T bili 1.5.  AST and ALT normal. -Continue broad-spectrum antibiotics: Vancomycin, aztreonam, metronidazole.  Patient is allergic to penicillins. -Received total 1 L normal saline bolus in the ED.  Blood pressure currently stable. -Check procalcitonin -Check lipase -CT abdomen pelvis without contrast to look for possible GI source of infection -CBC in a.m. -Blood culture x2 pending -Aspiration  precautions -Tylenol PRN  Atrial fibrillation -Currently rate controlled. CHA2DS2VASc 7.  IV Labetalol 5 mg prn SBP >160.  Hold home Coreg at this time.  Hold home Coumadin in the setting of hemorrhagic stroke. -Repeat EKG in a.m.  CKD 4 -Creatinine 1.4, at baseline. -Resume home renal meds when patient passes swallow eval.  Type 2 diabetes CBG 256 on admission. -Check A1c -Sliding scale insulin sensitive -CBG checks  Hypertension -Hold home Coreg at this time.  Swallow eval pending. IV Labetalol 58m prn SBP >160.  Hyperlipidemia -Not currently on any home medication.  Check lipid panel.  Mild Anemia In the setting of hemorrhagic stroke.  Hemoglobin 11.2.   -Repeat CBC in a.m. -Continue to monitor  Sacral pressure ulcer -Wound care consult   DVT prophylaxis: SCDs Code Status: DNR.  Signed form present at bedside. Family Communication: Daughter and son-in-law updated at bedside. Disposition Plan: Anticipate discharge to nursing home in 2 to 3 days. Consults called: Neurology (Dr. KLeonel Ramsay Admission status: It is my clinical opinion that admission to INPATIENT is reasonable and necessary in this 84y.o. female . presenting with symptoms of fever, tachycardia, tachypnea, left-sided neuro deficits, concerning for sepsis and acute CVA . in the context of PMH including: Hypertension, hyperlipidemia, type 2 diabetes . with pertinent positives on physical exam including: Left-sided neuro deficits, very somnolent . and pertinent positives on radiographic and laboratory data including: Chest x-ray with evidence of pneumonia.  MRI brain with evidence of acute hemorrhagic infarct. . Workup and treatment include stroke work-up, evaluation by neurology, sepsis work-up.  IV broad-spectrum antibiotics.  Given the aforementioned, the predictability of an adverse outcome is felt to be significant. I expect that the patient will require at least 2 midnights in the hospital to treat this  condition.    VShela LeffMD [facility] Pager [phone] If 7PM-7AM, please contact night-coverage [email or web address] Password [email or web address] 07/10/2018, 6:42 PM

## 2018-07-10 NOTE — ED Notes (Signed)
Hospitalist at bedside 

## 2018-07-10 NOTE — ED Notes (Signed)
Pt returned from MRI. Phlebotomy at bedside for second blood culture draw.

## 2018-07-10 NOTE — ED Provider Notes (Addendum)
[facility] EMERGENCY DEPARTMENT Provider Note   CSN: [id number] Arrival date & time: 07/10/18  1315   LEVEL 5 CAVEAT - ALTERED MENTAL STATUS   History   Chief Complaint Chief Complaint  Patient presents with  . Tremors    HPI ERRIKA Carpenter is a 84 y.o. female.  HPI  84 year old female brought in by EMS for tremors.  History is very limited as EMS is not currently present and the patient seems confused.  Unknown mental baseline at this time.  According to the nurse to talk to EMS, the patient was noted to have a left-sided facial droop and some left-sided weakness yesterday.  Unknown time.  Daughter refused transport at that time.  Today, patient was noted to have some tremors and so daughter was called again and consented to transport to the emergency department.  The patient answers some questions but currently is confused and further history is limited.  Past Medical History:  Diagnosis Date  . Anemia   . Atrial fibrillation ([place])   . Chronic kidney disease   . Diabetes mellitus without complication ([place])   . DVT (deep venous thrombosis) ([place])   . Hyperlipidemia   . Hypertension   . PE (pulmonary embolism)     Patient Active Problem List   Diagnosis Date Noted  . Acute CVA (cerebrovascular accident) ([place]) 07/10/2018  . Long term current use of anticoagulant therapy 12/06/2015  . Hypoglycemia 10/27/2015  . MDD (major depressive disorder), recurrent severe, without psychosis ([place]) 10/26/2015  . Dementia with behavioral disturbance ([place]) 10/26/2015  . Chronic kidney disease (CKD), stage IV (severe) ([place])   . Suicidal overdose ([place]) 10/24/2015  . Depression 10/24/2015  . Other acute pulmonary embolism without acute cor pulmonale ([place]) 08/31/2015  . Long term (current) use of anticoagulants [Z79.01] 08/31/2015  . Pulmonary embolism ([place]) 08/23/2015  . Shortness of breath 08/22/2015  . Dyspnea 08/17/2015  . DOE (dyspnea on exertion) 08/17/2015  . GERD  (gastroesophageal reflux disease) 02/23/2014  . Peripheral edema 02/23/2014  . GI bleed 04/05/2013  . Atrial fibrillation ([place]) 02/24/2013  . HTN (hypertension) 12/26/2010  . Chronic kidney disease (CKD) stage G3b/A2, moderately decreased glomerular filtration rate (GFR) between 30-44 mL/min/1.73 square meter and albuminuria creatinine ratio between 30-299 mg/g (H[facility]) 12/26/2010  . Hyperlipemia 12/26/2010  . Peripheral neuropathy 12/26/2010  . Anemia 12/26/2010  . Diabetes ([place]) 12/26/2010  . Vertebral fracture 12/26/2010  . Venous insufficiency 12/26/2010  . Osteoporosis 12/26/2010  . Thyromegaly 12/26/2010    Past Surgical History:  Procedure Laterality Date  . ABDOMINAL HYSTERECTOMY    . bladder tack    . CHOLECYSTECTOMY    . COLONOSCOPY N/A 05/03/2013   [id number] MOST LIKELY DUE TO ONE AVM in the ascending colon/Moderate diverticulosis hroughout the entire examined colon/Two POLYPS REMOVED/Small internal hemorrhoids  . ESOPHAGOGASTRODUODENOSCOPY N/A 04/06/2013   Procedure: ESOPHAGOGASTRODUODENOSCOPY (EGD);  Surgeon: Danie Binder, MD;  Location: [facility] ENDO SUITE;  Service: Endoscopy;  Laterality: N/A;  8:45  . GIVENS CAPSULE STUDY  04/06/2013   Procedure: GIVENS CAPSULE STUDY;  Surgeon: Danie Binder, MD;  Location: [facility] ENDO SUITE;  Service: Endoscopy;;     OB History    Gravida  2   Para  2   Term  2   Preterm      AB      Living  2     SAB      TAB      Ectopic      Multiple  Live Births               Home Medications    Prior to Admission medications   Medication Sig Start Date End Date Taking? Authorizing Provider  acetaminophen (TYLENOL) 500 MG tablet Take 500 mg by mouth every 4 (four) hours as needed for mild pain or moderate pain.   Yes [provider]  calcitRIOL (ROCALTROL) 0.25 MCG capsule Take 0.25 mcg by mouth Every Tuesday,Thursday,and Saturday with dialysis.  07/11/15  Yes [provider]  carvedilol (COREG) 12.5 MG  tablet Take 1 tablet (12.5 mg total) by mouth 2 (two) times daily with a meal. 08/18/15  Yes Elgergawy, Silver Huguenin, MD  Cholecalciferol 2000 units CAPS Take 4,000 Units by mouth daily.    Yes [provider]  fluticasone (FLONASE) 50 MCG/ACT nasal spray Place 1 spray into both nostrils daily.   Yes [provider]  furosemide (LASIX) 40 MG tablet Take 40 mg by mouth every morning.   Yes [provider]  glimepiride (AMARYL) 1 MG tablet Take 1 tablet (1 mg total) by mouth daily with breakfast. Patient taking differently: Take 2 mg by mouth daily with breakfast.  01/21/16  Yes Isla Pence, MD  magnesium hydroxide (MILK OF MAGNESIA) 400 MG/5ML suspension Take 30 mLs by mouth at bedtime as needed for mild constipation.   Yes [provider]  montelukast (SINGULAIR) 10 MG tablet Take 10 mg by mouth every evening.    Yes [provider]  Psyllium 400 MG CAPS Take 400 mg by mouth every morning.    Yes [provider]  senna (SENOKOT) 8.6 MG TABS tablet Take 1 tablet by mouth daily as needed for mild constipation or moderate constipation.    Yes [provider]  sodium bicarbonate 325 MG tablet Take 325 mg by mouth 2 (two) times daily.    Yes [provider]  traMADol (ULTRAM) 50 MG tablet Take 1 tablet (50 mg total) by mouth every 6 (six) hours as needed. Patient taking differently: Take 50 mg by mouth every 6 (six) hours as needed for moderate pain or severe pain.  06/18/18  Yes Milton Ferguson, MD  warfarin (COUMADIN) 2 MG tablet Take 2mg  on thursdays.  Take 3mg  all other days. Patient taking differently: Take 2 mg by mouth daily.  01/24/16  Yes Cherre Robins, PharmD    Family History Family History  Problem Relation Age of Onset  . Colon cancer Neg Hx     Social History Social History   Tobacco Use  . Smoking status: Never Smoker  . Smokeless tobacco: Never Used  Substance Use Topics  . Alcohol use: No  . Drug use: No      Allergies   Penicillins; Benicar [olmesartan medoxomil]; Ciprofloxacin; Hctz [hydrochlorothiazide]; Januvia [sitagliptin phosphate]; Norvasc [amlodipine besylate]; and Ramipril   Review of Systems Review of Systems  Unable to perform ROS: Mental status change     Physical Exam Updated Vital Signs BP (!) 156/103   Pulse (!) 101   Temp (!) 101.2 F (38.4 C) (Rectal)   Resp 18   Wt 74.4 kg   SpO2 96%   BMI 26.47 kg/m   Physical Exam  Constitutional: She appears well-developed and well-nourished.  HENT:  Head: Normocephalic and atraumatic.  Right Ear: External ear normal.  Left Ear: External ear normal.  Nose: Nose normal.  Mouth/Throat: Mucous membranes are dry.  Eyes: Pupils are equal, round, and reactive to light. EOM are normal. Right eye exhibits  no discharge. Left eye exhibits no discharge.  Cardiovascular: Normal rate, regular rhythm and normal heart sounds.  Pulmonary/Chest: Effort normal and breath sounds normal.  Abdominal: Soft. There is no tenderness.  Neurological: She is alert. She is disoriented.  Awake, alert. Oriented to self and place, disoriented to time. Left sided facial droop. Slurred speech. RUE, RLE with 5/5 strength LLE 5/5 strength. Unable to grip left hand but seems to attempt to move fingers when asked. Left arm is able to be lifted by patient but then droops.  Skin: Skin is warm and dry.  Psychiatric: Her mood appears not anxious.  Nursing note and vitals reviewed.    ED Treatments / Results  Labs (all labs ordered are listed, but only abnormal results are displayed) Labs Reviewed  PROTIME-INR - Abnormal; Notable for the following components:      Result Value   Prothrombin Time 15.4 (*)    All other components within normal limits  CBC - Abnormal; Notable for the following components:   WBC 11.2 (*)    Hemoglobin 10.9 (*)    HCT 34.6 (*)    All other components within normal limits  DIFFERENTIAL - Abnormal; Notable for the  following components:   Neutro Abs 9.4 (*)    All other components within normal limits  COMPREHENSIVE METABOLIC PANEL - Abnormal; Notable for the following components:   Glucose, Bld 279 (*)    BUN 24 (*)    Creatinine, Ser 1.49 (*)    Albumin 3.4 (*)    Alkaline Phosphatase 139 (*)    Total Bilirubin 1.5 (*)    GFR calc non Af Amer 30 (*)    GFR calc Af Amer 34 (*)    All other components within normal limits  URINALYSIS, ROUTINE W REFLEX MICROSCOPIC - Abnormal; Notable for the following components:   APPearance CLOUDY (*)    Glucose, UA 50 (*)    Hgb urine dipstick SMALL (*)    All other components within normal limits  BRAIN NATRIURETIC PEPTIDE - Abnormal; Notable for the following components:   B Natriuretic Peptide 604.0 (*)    All other components within normal limits  I-STAT CHEM 8, ED - Abnormal; Notable for the following components:   BUN 24 (*)    Creatinine, Ser 1.50 (*)    Glucose, Bld 263 (*)    Hemoglobin 11.2 (*)    HCT 33.0 (*)    All other components within normal limits  CBG MONITORING, ED - Abnormal; Notable for the following components:   Glucose-Capillary 256 (*)    All other components within normal limits  CULTURE, BLOOD (ROUTINE X 2)  CULTURE, BLOOD (ROUTINE X 2)  ETHANOL  APTT  RAPID URINE DRUG SCREEN, HOSP PERFORMED  LACTIC ACID, PLASMA  PROCALCITONIN  LIPASE, BLOOD  HEMOGLOBIN A1C  LIPID PANEL  CBC  I-STAT TROPONIN, ED    EKG EKG Interpretation  Date/Time:  Friday July 10 2018 16:55:21 EDT Ventricular Rate:  96 PR Interval:    QRS Duration: 135 QT Interval:  386 QTC Calculation: 488 R Axis:   103 Text Interpretation:  Atrial fibrillation Nonspecific intraventricular conduction delay Minimal ST depression, inferior leads Artifact in lead(s) I II III aVR aVL aVF V1 V2 V3 V4 V5 V6 Interpretation limited secondary to artifact Confirmed by Sherwood Gambler [phone]) on 07/10/2018 7:10:57 PM   Radiology Ct Head Wo Contrast  Result  Date: 07/10/2018 CLINICAL DATA:  Left-sided weakness and slurred speech EXAM: CT HEAD WITHOUT CONTRAST TECHNIQUE:  Contiguous axial images were obtained from the base of the skull through the vertex without intravenous contrast. COMPARISON:  June 18, 2018 FINDINGS: Brain: There is mild diffuse atrophy stable. There is no intracranial mass, hemorrhage, extra-axial fluid collection, or midline shift. There is new cytotoxic edema involving the right frontal-temporal junction which includes portions of the lateral anterior right lentiform nucleus, claustrum, anterior aspect of the right extreme capsule, and a portion of the right insular cortex. Brain parenchyma in the nearby periphery of the right posterior frontal and anterior temporal lobe regions involved as well with this apparent acute infarct. Elsewhere, there is evidence of a prior infarct in the mid left frontal lobe. There is evidence of a prior infarct in the medial right occipital lobe. There is also evidence of a prior infarct involving a portion of the anteromedial left occipital lobe. There is periventricular small vessel disease in the centra semiovale bilaterally. Vascular: No evident hyperdense vessel. There is calcification in each carotid siphon region. Skull: Bony calvarium appears intact. Sinuses/Orbits: There is opacification of the left sphenoid sinus with mild calcification in this area, stable. There is mucosal thickening in several ethmoid air cells. There is incomplete visualization of a retention cyst in the posterior right maxillary antrum. Orbits appear symmetric bilaterally. Other: Mastoid air cells are clear. IMPRESSION: 1. Apparent acute infarct with cytotoxic edema at the right posterior frontal-anterior temporal junction with involvement of portions of the anterolateral right basal ganglia, claustrum, extreme capsule, and insular cortex as well as brain parenchyma in posterior right frontal and anterior right temporal lobes. 2.  No  mass or hemorrhage evident.  Underlying atrophy is stable. 3. Prior bilateral occipital lobe and left frontal lobe infarcts noted. There is periventricular small vessel disease. 4.  Foci of arterial vascular calcification noted. 5.  Multifocal paranasal sinus disease. Electronically Signed   By: Lowella Grip III M.D.   On: 07/10/2018 14:29   Mr Jeri Cos And Wo Contrast  Result Date: 07/10/2018 CLINICAL DATA:  Rule out brain abscess. Left-sided facial droop and slurred speech. EXAM: MRI HEAD WITHOUT AND WITH CONTRAST TECHNIQUE: Multiplanar, multiecho pulse sequences of the brain and surrounding structures were obtained without and with intravenous contrast. CONTRAST:  7 mL Gadovist IV COMPARISON:  CT head 07/10/2018 FINDINGS: Brain: Image quality degraded by motion Acute hemorrhagic infarct in the right frontal operculum extending into the anterior insula. Acute infarct also present in the parietal operculum extending into the frontal parietal white matter. Generalized atrophy. Chronic infarct left frontal lobe. Mild chronic microvascular ischemia. Normal enhancement.  Negative for abscess or mass. Vascular: Normal arterial flow voids Skull and upper cervical spine: Negative Sinuses/Orbits: Mild mucosal edema paranasal sinuses. Bilateral cataract surgery Other: None IMPRESSION: Acute infarct right MCA territory. Hemorrhagic infarct right frontal operculum. Nonhemorrhagic infarct in the right parietal operculum. Negative for abscess. Electronically Signed   By: Franchot Gallo M.D.   On: 07/10/2018 16:14   Dg Chest Portable 1 View  Result Date: 07/10/2018 CLINICAL DATA:  84 y/o  F; fever. EXAM: PORTABLE CHEST 1 VIEW COMPARISON:  06/18/2018 chest radiograph FINDINGS: Normal cardiac silhouette. Aortic atherosclerosis with calcification. Diffusely increased reticular opacities. No focal consolidation. No pleural effusion or pneumothorax. No acute osseous abnormality is evident. IMPRESSION: Diffusely increased  reticular opacities may represent interstitial edema or atypical pneumonia. No consolidation. Electronically Signed   By: Kristine Garbe M.D.   On: 07/10/2018 15:05    Procedures .Critical Care Performed by: Sherwood Gambler, MD Authorized by: Sherwood Gambler, MD  Critical care provider statement:    Critical care time (minutes):  35   Critical care time was exclusive of:  Separately billable procedures and treating other patients   Critical care was necessary to treat or prevent imminent or life-threatening deterioration of the following conditions:  CNS failure or compromise and sepsis   Critical care was time spent personally by me on the following activities:  Development of treatment plan with patient or surrogate, discussions with consultants, evaluation of patient's response to treatment, examination of patient, obtaining history from patient or surrogate, ordering and performing treatments and interventions, ordering and review of laboratory studies, ordering and review of radiographic studies, pulse oximetry, re-evaluation of patient's condition and review of old charts   (including critical care time)  Medications Ordered in ED Medications  metroNIDAZOLE (FLAGYL) IVPB 500 mg ( Intravenous Stopped 07/10/18 1808)  vancomycin (VANCOCIN) 1,500 mg in sodium chloride 0.9 % 500 mL IVPB (has no administration in time range)  labetalol (NORMODYNE,TRANDATE) injection 5 mg (has no administration in time range)   stroke: mapping our early stages of recovery book (has no administration in time range)  acetaminophen (TYLENOL) tablet 650 mg (has no administration in time range)    Or  acetaminophen (TYLENOL) solution 650 mg (has no administration in time range)    Or  acetaminophen (TYLENOL) suppository 650 mg (has no administration in time range)  insulin aspart (novoLOG) injection 0-9 Units (has no administration in time range)  sodium chloride 0.9 % bolus 500 mL ( Intravenous  Stopped 07/10/18 1522)  acetaminophen (TYLENOL) suppository 650 mg (650 mg Rectal Given 07/10/18 1501)  aztreonam (AZACTAM) 2 g in sodium chloride 0.9 % 100 mL IVPB ( Intravenous Stopped 07/10/18 1700)  sodium chloride 0.9 % bolus 500 mL (0 mLs Intravenous Stopped 07/10/18 1738)  gadobutrol (GADAVIST) 1 MMOL/ML injection 7 mL (7 mLs Intravenous Contrast Given 07/10/18 1608)  labetalol (NORMODYNE,TRANDATE) injection 10 mg (10 mg Intravenous Given 07/10/18 1636)  labetalol (NORMODYNE,TRANDATE) injection 20 mg (20 mg Intravenous Given 07/10/18 1704)  fentaNYL (SUBLIMAZE) injection 50 mcg (50 mcg Intravenous Given 07/10/18 1704)     Initial Impression / Assessment and Plan / ED Course  I have reviewed the triage vital signs and the nursing notes.  Pertinent labs & imaging results that were available during my care of the patient were reviewed by me and considered in my medical decision making (see chart for details).  Clinical Course as of Jul 10 1822  Fri Jul 10, 2018  1500 Discussed with Dr. Leonel Ramsay.  Given the atypical appearance on CT with the edema in addition to the fever, he would like to rule out abscess first.  Recommends MRI with and without.  If this is negative, treat fever and look for source, which is probably respiratory given increased aspiration risk and abnormal chest x-ray findings.   [SG]    Clinical Course User Index [SG] Sherwood Gambler, MD    Patient appears to have had a CVA. MRI shows stroke with some hemorrhagic infarct as well. Dr Leonel Ramsay recommends IV prn meds to help get BP under 017 systolic. BP has improved with labetalol. She also has some pain in her wrist, given IV fentanyl, and pain may be contributing to BP. Found to be febrile, likely cause of her tremors. I think it's unlikely she has a CNS infection. UA clear. Probably aspiration pneumonia/pneumonitis given acute trouble swallowing and acute stroke. Given broad antibiotics. Will admit to hospitalist service  at [facility].   Final  Clinical Impressions(s) / ED Diagnoses   Final diagnoses:  Stroke [facility])  Acute CVA (cerebrovascular accident) ([place])  Aspiration pneumonia, unspecified aspiration pneumonia type, unspecified laterality, unspecified part of lung [facility])    ED Discharge Orders    None       Sherwood Gambler, MD 07/10/18 [place]    Sherwood Gambler, MD 07/10/18 1911

## 2018-07-10 NOTE — ED Notes (Signed)
Phlebotomy at bedside.

## 2018-07-10 NOTE — Progress Notes (Signed)
Pharmacy Antibiotic Note  Julie Carpenter is a 84 y.o. female admitted on 07/10/2018 with pneumonia.  Pharmacy has been consulted for Vancomycin and Aztreonam dosing.  Plan: Vancomycin 1500mg  loading dose, then 750mg  IV every 24 hours.  Goal trough 15-20 mcg/mL.  Aztreonam 2gm Loading dose, then 1gm IV q8h F/U cxs and clinical progress Monitor V/S, labs and levels as indicated  Weight: 164 lb 0.4 oz (74.4 kg)  Temp (24hrs), Avg:99.9 F (37.7 C), Min:98.6 F (37 C), Max:101.2 F (38.4 C)  Recent Labs  Lab 07/10/18 1352 07/10/18 1403 07/10/18 1517  WBC 11.2*  --   --   CREATININE 1.49* 1.50*  --   LATICACIDVEN  --   --  1.9    Estimated Creatinine Clearance: 25.7 mL/min (A) (by C-G formula based on SCr of 1.5 mg/dL (H)).    Allergies  Allergen Reactions  . Penicillins Anaphylaxis    Has patient had a PCN reaction causing immediate rash, facial/tongue/throat swelling, SOB or lightheadedness with hypotension: Yes Has patient had a PCN reaction causing severe rash involving mucus membranes or skin necrosis: No Has patient had a PCN reaction that required hospitalization No Has patient had a PCN reaction occurring within the last 10 years: No If all of the above answers are "NO", then may proceed with Cephalosporin use.  Marland Kitchen Benicar [Olmesartan Medoxomil] Swelling    Lips swelling  . Ciprofloxacin Swelling  . Hctz [Hydrochlorothiazide] Swelling    Lips swelling  . Januvia [Sitagliptin Phosphate] Swelling    Lips swelling  . Norvasc [Amlodipine Besylate] Swelling    Lips swelling  . Ramipril Swelling    Lips swelling    Antimicrobials this admission: Vancomycin 11/1 >>  Aztreonam 11/1 >>  Metronidazole 11/1>>  Dose adjustments this admission: N/A  Microbiology results: 11/1 BCx: pending  MRSA PCR:   Thank you for allowing pharmacy to be a part of this patient's care.  Isac Sarna, BS [place], [place] Clinical Pharmacist Pager [phone] 07/10/2018 6:49 PM

## 2018-07-11 ENCOUNTER — Inpatient Hospital Stay (HOSPITAL_COMMUNITY): Payer: Medicare Other

## 2018-07-11 ENCOUNTER — Encounter (HOSPITAL_COMMUNITY): Payer: Self-pay

## 2018-07-11 DIAGNOSIS — D649 Anemia, unspecified: Secondary | ICD-10-CM

## 2018-07-11 DIAGNOSIS — N184 Chronic kidney disease, stage 4 (severe): Secondary | ICD-10-CM

## 2018-07-11 DIAGNOSIS — I639 Cerebral infarction, unspecified: Secondary | ICD-10-CM

## 2018-07-11 DIAGNOSIS — J690 Pneumonitis due to inhalation of food and vomit: Secondary | ICD-10-CM

## 2018-07-11 DIAGNOSIS — I4821 Permanent atrial fibrillation: Secondary | ICD-10-CM

## 2018-07-11 LAB — GLUCOSE, CAPILLARY
GLUCOSE-CAPILLARY: 169 mg/dL — AB (ref 70–99)
GLUCOSE-CAPILLARY: 154 mg/dL — AB (ref 70–99)
Glucose-Capillary: 181 mg/dL — ABNORMAL HIGH (ref 70–99)
Glucose-Capillary: 187 mg/dL — ABNORMAL HIGH (ref 70–99)
Glucose-Capillary: 192 mg/dL — ABNORMAL HIGH (ref 70–99)

## 2018-07-11 MED ORDER — ASPIRIN EC 81 MG PO TBEC
81.0000 mg | DELAYED_RELEASE_TABLET | Freq: Every day | ORAL | Status: DC
  Administered 2018-07-15: 81 mg via ORAL
  Filled 2018-07-11 (×2): qty 1

## 2018-07-11 MED ORDER — ASPIRIN 300 MG RE SUPP
300.0000 mg | Freq: Every day | RECTAL | Status: DC
  Administered 2018-07-11 – 2018-07-14 (×3): 300 mg via RECTAL
  Filled 2018-07-11 (×4): qty 1

## 2018-07-11 MED ORDER — SODIUM CHLORIDE 0.9 % IV SOLN
INTRAVENOUS | Status: AC
  Administered 2018-07-11: 03:00:00 via INTRAVENOUS

## 2018-07-11 NOTE — Progress Notes (Signed)
  Echocardiogram 2D Echocardiogram has been attempted. Patient not in room.  Rolanda Campa G Eaton Folmar 07/11/2018, 2:18 PM

## 2018-07-11 NOTE — Progress Notes (Signed)
[facility] PROGRESS NOTE  Julie Carpenter INO:[id number] DOB: 06/04/1926 DOA: 07/10/2018  PCP: Vidal Schwalbe, MD  Brief History/Interval Summary:  84 y.o. female with medical history significant of atrial fibrillation on anticoagulation with warfarin, CKD, type 2 diabetes, hypertension, hyperlipidemia, prior VTE presented to [facility] from her nursing home via EMS for evaluation of tremors.    Patient was very somnolent so history was limited.  There was also concern for left-sided facial droop and left-sided weakness.  Patient also was noted to have a fever with a temperature of 101.2 F.  MRI was done and reviewed an acute infarct in the right MCA territory.  Hemorrhagic infarct was noted in the right frontal operculum and a nonhemorrhagic infarct in the right parietal operculum.  Patient was transferred to [facility] for further evaluation.    Reason for Visit: Acute stroke  Consultants: Neurology  Procedures:  Transthoracic echocardiogram is pending Carotid Dopplers pending  Antibiotics: Vancomycin, aztreonam and metronidazole  Subjective/Interval History: Patient somnolent this morning but easily arousable.  Follows commands.  Her granddaughter is at the bedside.  Patient denies any pain.  ROS: Denies any headaches.  No nausea or vomiting.  Objective:  Vital Signs  Vitals:   07/11/18 0600 07/11/18 0630 07/11/18 0804 07/11/18 0926  BP: 134/83 (!) 159/91 131/89 (!) 150/80  Pulse:   86 90  Resp: 17 17 20    Temp:   99 F (37.2 C)   TempSrc:   Axillary   SpO2:   100% 96%  Weight:        Intake/Output Summary (Last 24 hours) at 07/11/2018 1058 Last data filed at 07/11/2018 0556 Gross per 24 hour  Intake 2374.24 ml  Output -  Net 2374.24 ml   Filed Weights   07/10/18 1326 07/10/18 2301  Weight: 74.4 kg 76.1 kg    General appearance: cooperative, appears stated age, distracted and slowed mentation Head: Normocephalic, without obvious  abnormality, atraumatic Resp: Normal effort noted at rest.  Diminished air entry at the bases without any definite crackles wheezing or rhonchi. Cardio: S1-S2 is irregularly irregular.  No S3-S4.  No rubs or bruit.  Cardiac murmur appreciated over the precordium. GI: soft, non-tender; bowel sounds normal; no masses,  no organomegaly Extremities: extremities normal, atraumatic, no cyanosis or edema Pulses: 2+ and symmetric Neurologic: She is somnolent but arousable.  No clear-cut facial droop identified although exam is limited as the patient did not smile for me.  She is noted to have weakness in the left arm and leg compared to the right.  Lab Results:  Data Reviewed: I have personally reviewed following labs and imaging studies  CBC: Recent Labs  Lab 07/10/18 1352 07/10/18 1403  WBC 11.2*  --   NEUTROABS 9.4*  --   HGB 10.9* 11.2*  HCT 34.6* 33.0*  MCV 86.1  --   PLT 340  --     Basic Metabolic Panel: Recent Labs  Lab 07/10/18 1352 07/10/18 1403  NA 135 137  K 4.0 4.0  CL 103 102  CO2 23  --   GLUCOSE 279* 263*  BUN 24* 24*  CREATININE 1.49* 1.50*  CALCIUM 9.4  --     GFR: Estimated Creatinine Clearance: 26 mL/min (A) (by C-G formula based on SCr of 1.5 mg/dL (H)).  Liver Function Tests: Recent Labs  Lab 07/10/18 1352  AST 25  ALT 15  ALKPHOS 139*  BILITOT 1.5*  PROT 7.7  ALBUMIN 3.4*    Recent  Labs  Lab 07/10/18 1515  LIPASE 32   Coagulation Profile: Recent Labs  Lab 07/10/18 1352  INR 1.23    CBG: Recent Labs  Lab 07/10/18 1341 07/10/18 2128 07/11/18 0008 07/11/18 0924  GLUCAP 256* 243* 187* 192*     Recent Results (from the past 240 hour(s))  Culture, blood (routine x 2)     Status: None (Preliminary result)   Collection Time: 07/10/18  3:15 PM  Result Value Ref Range Status   Specimen Description BLOOD LEFT ANTECUBITAL  Final   Special Requests   Final    BOTTLES DRAWN AEROBIC AND ANAEROBIC Blood Culture adequate volume    Culture   Final    NO GROWTH < 24 HOURS Performed at [facility], [address] [place], [place] [address]    Report Status PENDING  Incomplete  Culture, blood (routine x 2)     Status: None (Preliminary result)   Collection Time: 07/10/18  4:11 PM  Result Value Ref Range Status   Specimen Description BLOOD  Final   Special Requests NONE  Final   Culture   Final    NO GROWTH < 24 HOURS Performed at [facility], [address] [place], [place] [address]    Report Status PENDING  Incomplete      Radiology Studies: Ct Abdomen Pelvis Wo Contrast  Result Date: 07/10/2018 CLINICAL DATA:  Generalized abdominal pain and tenderness EXAM: CT ABDOMEN AND PELVIS WITHOUT CONTRAST TECHNIQUE: Multidetector CT imaging of the abdomen and pelvis was performed following the standard protocol without IV contrast. COMPARISON:  Renal ultrasound from 2012 by report FINDINGS: Lower chest: No focal infiltrate is seen. Minimal atelectatic changes are noted. Coronary calcifications are seen. Hepatobiliary: No focal liver abnormality is seen. Status post cholecystectomy. No biliary dilatation. Common bile duct is prominent consistent with the post cholecystectomy state. Pancreas: Unremarkable. No pancreatic ductal dilatation or surrounding inflammatory changes. Spleen: Normal in size without focal abnormality. Adrenals/Urinary Tract: Adrenal glands are within normal limits bilaterally. The kidneys are well visualized without renal calculi. Rounded hypodensity is noted within the left kidney consistent with a cyst. This is stable by report from 2012. the bladder is well distended. A small focus of air is noted within the bladder likely related to instrumentation. Clinical correlation is recommended. Stomach/Bowel: Scattered diverticular change of the colon is noted without evidence of diverticulitis. The appendix is not well visualized although no inflammatory changes to suggest appendicitis are seen. Small bowel and  stomach are unremarkable. Vascular/Lymphatic: Aortic atherosclerosis. No enlarged abdominal or pelvic lymph nodes. Reproductive: Status post hysterectomy. No adnexal masses. Other: No abdominal wall hernia or abnormality. No abdominopelvic ascites. Musculoskeletal: Degenerative changes of the lumbar spine are seen. Old compression deformities are seen at L2 and L4. No acute bony abnormality is seen. IMPRESSION: Chronic changes as described above. No acute abnormality is identified. Electronically Signed   By: Inez Catalina M.D.   On: 07/10/2018 19:15   Ct Head Wo Contrast  Result Date: 07/10/2018 CLINICAL DATA:  Left-sided weakness and slurred speech EXAM: CT HEAD WITHOUT CONTRAST TECHNIQUE: Contiguous axial images were obtained from the base of the skull through the vertex without intravenous contrast. COMPARISON:  June 18, 2018 FINDINGS: Brain: There is mild diffuse atrophy stable. There is no intracranial mass, hemorrhage, extra-axial fluid collection, or midline shift. There is new cytotoxic edema involving the right frontal-temporal junction which includes portions of the lateral anterior right lentiform nucleus, claustrum, anterior aspect of the right extreme capsule, and a  portion of the right insular cortex. Brain parenchyma in the nearby periphery of the right posterior frontal and anterior temporal lobe regions involved as well with this apparent acute infarct. Elsewhere, there is evidence of a prior infarct in the mid left frontal lobe. There is evidence of a prior infarct in the medial right occipital lobe. There is also evidence of a prior infarct involving a portion of the anteromedial left occipital lobe. There is periventricular small vessel disease in the centra semiovale bilaterally. Vascular: No evident hyperdense vessel. There is calcification in each carotid siphon region. Skull: Bony calvarium appears intact. Sinuses/Orbits: There is opacification of the left sphenoid sinus with mild  calcification in this area, stable. There is mucosal thickening in several ethmoid air cells. There is incomplete visualization of a retention cyst in the posterior right maxillary antrum. Orbits appear symmetric bilaterally. Other: Mastoid air cells are clear. IMPRESSION: 1. Apparent acute infarct with cytotoxic edema at the right posterior frontal-anterior temporal junction with involvement of portions of the anterolateral right basal ganglia, claustrum, extreme capsule, and insular cortex as well as brain parenchyma in posterior right frontal and anterior right temporal lobes. 2.  No mass or hemorrhage evident.  Underlying atrophy is stable. 3. Prior bilateral occipital lobe and left frontal lobe infarcts noted. There is periventricular small vessel disease. 4.  Foci of arterial vascular calcification noted. 5.  Multifocal paranasal sinus disease. Electronically Signed   By: Lowella Grip III M.D.   On: 07/10/2018 14:29   Mr Jeri Cos And Wo Contrast  Result Date: 07/10/2018 CLINICAL DATA:  Rule out brain abscess. Left-sided facial droop and slurred speech. EXAM: MRI HEAD WITHOUT AND WITH CONTRAST TECHNIQUE: Multiplanar, multiecho pulse sequences of the brain and surrounding structures were obtained without and with intravenous contrast. CONTRAST:  7 mL Gadovist IV COMPARISON:  CT head 07/10/2018 FINDINGS: Brain: Image quality degraded by motion Acute hemorrhagic infarct in the right frontal operculum extending into the anterior insula. Acute infarct also present in the parietal operculum extending into the frontal parietal white matter. Generalized atrophy. Chronic infarct left frontal lobe. Mild chronic microvascular ischemia. Normal enhancement.  Negative for abscess or mass. Vascular: Normal arterial flow voids Skull and upper cervical spine: Negative Sinuses/Orbits: Mild mucosal edema paranasal sinuses. Bilateral cataract surgery Other: None IMPRESSION: Acute infarct right MCA territory. Hemorrhagic  infarct right frontal operculum. Nonhemorrhagic infarct in the right parietal operculum. Negative for abscess. Electronically Signed   By: Franchot Gallo M.D.   On: 07/10/2018 16:14   Dg Chest Portable 1 View  Result Date: 07/10/2018 CLINICAL DATA:  84 y/o  F; fever. EXAM: PORTABLE CHEST 1 VIEW COMPARISON:  06/18/2018 chest radiograph FINDINGS: Normal cardiac silhouette. Aortic atherosclerosis with calcification. Diffusely increased reticular opacities. No focal consolidation. No pleural effusion or pneumothorax. No acute osseous abnormality is evident. IMPRESSION: Diffusely increased reticular opacities may represent interstitial edema or atypical pneumonia. No consolidation. Electronically Signed   By: Kristine Garbe M.D.   On: 07/10/2018 15:05     Medications:  Scheduled: . insulin aspart  0-9 Units Subcutaneous TID WC   Continuous: . sodium chloride 75 mL/hr at 07/11/18 0306  . aztreonam 1 g (07/11/18 0308)  . metronidazole 500 mg (07/11/18 0957)  . vancomycin     YSA:[id number] **OR** acetaminophen (TYLENOL) oral liquid 160 mg/5 mL **OR** acetaminophen, labetalol  Assessment/Plan:    Acute stroke Multiple infarct seen on MRI.  1 of the infarcts has an hemorrhagic component.  Patient transferred to [facility]  for higher level of care.  Neurology consulted and has seen the patient.  Echocardiogram and carotid Doppler pending.  Blood pressure control.  PT OT speech therapy evaluation.  Aspiration precautions.  Holding aspirin due to hemorrhagic component.  Patient was on warfarin for history of atrial fibrillation which is also on hold.  Acute metabolic encephalopathy Likely multifactorial including stroke, fever and medications.  Apparently she was given fentanyl at the outside facility.  Avoid narcotic agents.  Mentation appears to have improved.  Continue to monitor closely.  Sepsis secondary to aspiration pneumonia CT scan of the abdomen and pelvis was done  as the patient was noted to be tender.  No acute findings noted.  UA does not suggest UTI.  Chest x-ray did suggest aspiration.  Patient was started on broad-spectrum coverage with vancomycin and aztreonam and metronidazole.  She was given fluid boluses in the ED.  Continue to monitor closely.  Lactic acid level was 1.9.  Procalcitonin was less than 0.1.  Blood cultures are pending.  Lipase level was normal.  History of chronic atrial fibrillation Rate is controlled.  Continue to monitor on telemetry.  Patient was on carvedilol at home which is held due to risk for aspiration.  Use IV beta-blocker as needed.  Chads 2 vascular score is 7.  She was on warfarin which is being held due to the hemorrhagic component noted on MRI brain.  Chronic kidney disease stage IV Creatinine has been at baseline.  Continue to monitor closely.  Diabetes mellitus type 2 with renal complications Monitor CBGs.  SSI.  No recent HbA1c in our system.  One is pending.  Essential hypertension Using IV labetalol as needed for blood pressure control.  Hyperlipidemia Lipid panel pending.  Normocytic anemia No evidence for overt bleeding.  Monitor hemoglobin periodically.  Sacral decubitus stage II Wound care.  DVT Prophylaxis: SCDs    Code Status: DNR Family Communication: Discussed with the patient's granddaughter Disposition Plan: Management as outlined above.    LOS: 1 day   [facility] Pager [phone] 07/11/2018, 10:58 AM  If 7PM-7AM, please contact night-coverage at [email or web address], password [email or web address]

## 2018-07-11 NOTE — Progress Notes (Signed)
STROKE TEAM PROGRESS NOTE   SUBJECTIVE (INTERVAL HISTORY) Her two daughters are at the bedside.  Pt initially sleeping in bed and then arouse during exam. Daughter stated that pt was on Xarelto but stopped due to GIB. She then had colonoscopy and found to have polyp which was cauterized. Since then, she was put on coumadin, no more GIB, however, INR was up and down. One week ago, her INR 1.40 but her coumadin dose was not changed. She was on "antibiotics" for shingles and then felt dizziness with the medication and caused her fell and broken her left wrist not long ago.   OBJECTIVE Vitals:   07/11/18 0630 07/11/18 0804 07/11/18 0926 07/11/18 1100  BP: (!) 159/91 131/89 (!) 150/80   Pulse:  86 90   Resp: 17 20    Temp:  99 F (37.2 C)  98.9 F (37.2 C)  TempSrc:  Axillary  Axillary  SpO2:  100% 96%   Weight:        CBC:  Recent Labs  Lab 07/10/18 1352 07/10/18 1403  WBC 11.2*  --   NEUTROABS 9.4*  --   HGB 10.9* 11.2*  HCT 34.6* 33.0*  MCV 86.1  --   PLT 340  --     Basic Metabolic Panel:  Recent Labs  Lab 07/10/18 1352 07/10/18 1403  NA 135 137  K 4.0 4.0  CL 103 102  CO2 23  --   GLUCOSE 279* 263*  BUN 24* 24*  CREATININE 1.49* 1.50*  CALCIUM 9.4  --     Lipid Panel:     Component Value Date/Time   CHOL 177 07/24/2015 1429   CHOL 141 02/24/2013 1209   TRIG 215 (H) 07/24/2015 1429   TRIG 215 (H) 12/26/2014 0839   TRIG 113 02/24/2013 1209   HDL 34 (L) 07/24/2015 1429   HDL 34 (L) 12/26/2014 0839   HDL 34 (L) 02/24/2013 1209   CHOLHDL 5.2 (H) 07/24/2015 1429   LDLCALC 100 (H) 07/24/2015 1429   LDLCALC 77 05/30/2014 0934   LDLCALC 84 02/24/2013 1209   HgbA1c:  Lab Results  Component Value Date   HGBA1C 6.9 11/08/2015   Urine Drug Screen:     Component Value Date/Time   LABOPIA NONE DETECTED 07/10/2018 1331   COCAINSCRNUR NONE DETECTED 07/10/2018 1331   LABBENZ NONE DETECTED 07/10/2018 1331   AMPHETMU NONE DETECTED 07/10/2018 1331   THCU NONE  DETECTED 07/10/2018 1331   LABBARB NONE DETECTED 07/10/2018 1331    Alcohol Level     Component Value Date/Time   ETH <10 07/10/2018 1352    IMAGING  Ct Head Wo Contrast 07/10/2018 IMPRESSION:  1. Apparent acute infarct with cytotoxic edema at the right posterior frontal-anterior temporal junction with involvement of portions of the anterolateral right basal ganglia, claustrum, extreme capsule, and insular cortex as well as brain parenchyma in posterior right frontal and anterior right temporal lobes.  2.  No mass or hemorrhage evident.  Underlying atrophy is stable.  3. Prior bilateral occipital lobe and left frontal lobe infarcts noted. There is periventricular small vessel disease.  4.  Foci of arterial vascular calcification noted.  5.  Multifocal paranasal sinus disease.    Mr Jeri Cos And Wo Contrast 07/10/2018 IMPRESSION:  Acute infarct right MCA territory. Hemorrhagic infarct right frontal operculum. Nonhemorrhagic infarct in the right parietal operculum. Negative for abscess.    Ct Abdomen Pelvis Wo Contrast 07/10/2018 IMPRESSION:  Chronic changes as described above. No acute abnormality is identified.  Dg Chest Portable 1 View 07/10/2018 IMPRESSION:  Diffusely increased reticular opacities may represent interstitial edema or atypical pneumonia. No consolidation.    MRA head In this patient with acute right MCA infarct, the right middle cerebral arteries widely patent. Mild stenosis left posterior cerebral artery.   Transthoracic Echocardiogram - pending 00/00/00   Bilateral Carotid Dopplers - pending 00/00/00    PHYSICAL EXAM  Temp:  [98 F (36.7 C)-99 F (37.2 C)] 98.5 F (36.9 C) (11/02 1620) Pulse Rate:  [78-101] 90 (11/02 1620) Resp:  [14-34] 20 (11/02 1620) BP: (127-180)/(73-151) 134/74 (11/02 1620) SpO2:  [92 %-100 %] 93 % (11/02 1620) Weight:  [76.1 kg] 76.1 kg (11/01 2301)  General - thin built, well developed, sleepy.  Ophthalmologic  - fundi not visualized due to noncooperation.  Cardiovascular - irregularly irregular heart rate and rhythm.  Neuro - initially sleeping, but later arouse, awake, alert and following simple commands.  Limited language output, paucity of speech, moderate dysarthria.  Orientated to people, but not orientated to time or place.  Naming 2/3, and able to repeat simple sentences but moderate dysarthria.  PERRL, EOMI, blinking to visual threat bilaterally.  No significant facial asymmetry, tongue midline.  Right upper extremity 4/5, left upper extremity 3/5 due to pain on the left wrist fracture, bilateral lower extremity 3/5.  DTR 1+, no Babinski.  Sensation symmetrical, coordination not cooperative, bilateral resting tremor, right more than left.  Gait not tested.   ASSESSMENT/PLAN Ms. SUHEY RADFORD is a 84 y.o. female with history of atrial fibrillation on coumadin, DVT, PE, DM, CKD, anemia, dementia, previous strokes, HTN, and HLD presenting with left facial droop, somnolence, elevated BP, AMS, fever, tachycardia, tachypnea, mild leukocytosis, and an abnormal CXR. She did not receive IV t-PA due to anticoagulation and petechial hemorrhagic transformation on MRI.  Stroke:  Acute infarct right MCA territory likely embolic from atrial fibrillation on coumadin but subtherapeutic INR.  CT head  -right frontal subacute infarct, old left frontal infarct  MRI head - Acute infarct right MCA territory. Hemorrhagic infarct right frontal operculum. Nonhemorrhagic infarct in the right parietal operculum.  MRA head - unremarkable  Carotid Doppler - pending  2D Echo - pending  LDL - pending  HgbA1c - pending  UDS neg  VTE prophylaxis - SCDs  Diet - NPO  warfarin daily prior to admission, now on aspirin.  Although MRI showed petechial hemorrhagic conversion, CT did not show evident hyperdense, therefore it is okay to start anticoagulation in 3-5 days for stroke prevention.  Patient counseled to be  compliant with her antithrombotic medications  Ongoing aggressive stroke risk factor management  Therapy recommendations:  SNF  Disposition:  Pending  Chronic AF  As per daughter, she was on Coumadin at the nursing home, INR last week 1.4, did not change Coumadin dose  She was on Xarelto in the past, stopped due to GI bleeding which was cured with colonoscopy and cauterization  Currently on coumadin without recurrent GI bleeding  However subtherapeutic INR on admission 1.23  Recommend to start eliquis in 3-5 days, given the lower GIB rate of Eliquis than Xarelto or Coumadin.  Hypertension  Stable  SBP goal < 160 mmHg due to hemorrhagic transformation . Long-term BP goal normotensive  Hyperlipidemia  Lipid lowering medication PTA:  none  LDL - pending, goal < 70  Diabetes  HgbA1c - pending, goal < 7.0  Mild hyperglycemia  Dysphagia  Did not pass swallow  NPO now  On IVF  Speech  to follow  Fever and possible pneumonia  Tmax 101.2 -> afebrile  UA neg  CXR concerning for pneumonia  Blood culture pending  On Azactam and flagyl and vanco  CT abdomen pelvis - no acute finding  Other Stroke Risk Factors  Advanced age  Hx stroke/TIA  Hx of PE/DVT - on coumadin PTA  Other Active Problems  CKD III - Creatinine - 1.50  Mild anemia likely due to CKD Hb 11.2  Dementia at baseline living in facility  B/l hand tremor at baseline  Hospital day # 1  Rosalin Hawking, MD PhD Stroke Neurology 07/11/2018 6:21 PM    To contact Stroke Continuity provider, please refer to [email or web address] After hours, contact General Neurology

## 2018-07-11 NOTE — Progress Notes (Signed)
Evaluated pt at bedside at the request of Dr Marlowe Sax. On assessment, pt was sleeping. Arousable with physical stimuli but slow to respond.. VSS. Spoke with family at bedside and this is apparently pt mental status since receiving fentanyl at [facility]. Discussed current treatment plan.Continue to monitor.  Hypertension -Maintain SBP less than 160  With IV labetolol as requested by attending.  Altered Mental Status - Continue with mNIHSS for another 12 hours - Neurology consulted  Lovey Newcomer, NP [facility] 7p-7a [phone]

## 2018-07-11 NOTE — Consult Note (Signed)
Referring Physician: Dr. Rathore    Chief Complaint: Confusion. Stroke seen on MRI   HPI: Julie Carpenter is an 84 y.o. female with atrial fibrillation who presented via EMS to the  ED from her assisted living facility for evaluation of AMS and tremors. Daughter had also noted a left facial droop on Friday morning. The patient was also not able to walk to the bathroom.   She was somnolent at the OSH and could not provide history. Per her daughter, she is demented, but at baseline is awake, alert, and oriented to person and place. She was conversant initially in the [facility] ED, but became somnolent after receiving a pain medication.   She was febrile at the OSH ED with temp of 101.2. Also was tachycardic and tachypneinc. BP was 169/109 on arrival. Mild leukocytosis on OSH labs. CXR was suggestive of atypical pneumonia versus interstitial edema.   MRI brain at OSH revealed a subacute infarct in the right MCA territory involving the right frontal operculum, with petechial hemorrhagic transformation. Also noted was a nonhemorrhagic infarct in the right parietal operculum. An old left frontal infarct, an old right occipital lobe infarct and diffuse cerebral atrophy were also seen.   She takes warfarin as an outpatient. This was stopped at OSH due to the petechial hemorrhagic transformation seen on MRI.    Past Medical History:  Diagnosis Date  . Anemia   . Atrial fibrillation (HCC)   . Chronic kidney disease   . Diabetes mellitus without complication (HCC)   . DVT (deep venous thrombosis) (HCC)   . Hyperlipidemia   . Hypertension   . PE (pulmonary embolism)     Past Surgical History:  Procedure Laterality Date  . ABDOMINAL HYSTERECTOMY    . bladder tack    . CHOLECYSTECTOMY    . COLONOSCOPY N/A 05/03/2013   SLF:[facility] MOST LIKELY DUE TO ONE AVM in the ascending colon/Moderate diverticulosis hroughout the entire examined colon/Two POLYPS REMOVED/Small internal hemorrhoids  .  ESOPHAGOGASTRODUODENOSCOPY N/A 04/06/2013   Procedure: ESOPHAGOGASTRODUODENOSCOPY (EGD);  Surgeon: Sandi L Fields, MD;  Location[facility];  Service: Endoscopy;  Laterality: N/A;  8:45  . GIVENS CAPSULE STUDY  04/06/2013   Procedure: GIVENS CAPSULE STUDY;  Surgeon: Sandi L Fields, MD;  Location: [facility];  Service: Endoscopy;;    Family History  Problem Relation Age of Onset  . Colon cancer Neg Hx    Social History:  reports that she has never smoked. She has never used smokeless tobacco. She reports that she does not drink alcohol or use drugs.  Allergies:  Allergies  Allergen Reactions  . Penicillins Anaphylaxis    Has patient had a PCN reaction causing immediate rash, facial/tongue/throat swelling, SOB or lightheadedness with hypotension: Yes Has patient had a PCN reaction causing severe rash involving mucus membranes or skin necrosis: No Has patient had a PCN reaction that required hospitalization No Has patient had a PCN reaction occurring within the last 10 years: No If all of the above answers are "NO", then may proceed with Cephalosporin use.  . Benicar [Olmesartan Medoxomil] Swelling    Lips swelling  . Ciprofloxacin Swelling  . Hctz [Hydrochlorothiazide] Swelling    Lips swelling  . Januvia [Sitagliptin Phosphate] Swelling    Lips swelling  . Norvasc [Amlodipine Besylate] Swelling    Lips swelling  . Ramipril Swelling    Lips swelling    Medications:  Prior to Admission:  Medications Prior to Admission  Medication Sig Dispense Refill   Last Dose  . acetaminophen (TYLENOL) 500 MG tablet Take 500 mg by mouth every 4 (four) hours as needed for mild pain or moderate pain.   Past Month at Unknown time  . calcitRIOL (ROCALTROL) 0.25 MCG capsule Take 0.25 mcg by mouth Every Tuesday,Thursday,and Saturday with dialysis.   5 07/09/2018 at Unknown time  . carvedilol (COREG) 12.5 MG tablet Take 1 tablet (12.5 mg total) by mouth 2 (two) times daily with a meal. 60 tablet 0  07/10/2018 at 746a  . Cholecalciferol 2000 units CAPS Take 4,000 Units by mouth daily.    07/10/2018 at Unknown time  . fluticasone (FLONASE) 50 MCG/ACT nasal spray Place 1 spray into both nostrils daily.   07/10/2018 at Unknown time  . furosemide (LASIX) 40 MG tablet Take 40 mg by mouth every morning.   07/10/2018 at Unknown time  . glimepiride (AMARYL) 1 MG tablet Take 1 tablet (1 mg total) by mouth daily with breakfast. (Patient taking differently: Take 2 mg by mouth daily with breakfast. ) 30 tablet 30 07/10/2018 at Unknown time  . magnesium hydroxide (MILK OF MAGNESIA) 400 MG/5ML suspension Take 30 mLs by mouth at bedtime as needed for mild constipation.   unknown  . montelukast (SINGULAIR) 10 MG tablet Take 10 mg by mouth every evening.    07/09/2018 at Unknown time  . Psyllium 400 MG CAPS Take 400 mg by mouth every morning.    07/10/2018 at Unknown time  . senna (SENOKOT) 8.6 MG TABS tablet Take 1 tablet by mouth daily as needed for mild constipation or moderate constipation.    Past Month at Unknown time  . sodium bicarbonate 325 MG tablet Take 325 mg by mouth 2 (two) times daily.    07/10/2018 at Unknown time  . traMADol (ULTRAM) 50 MG tablet Take 1 tablet (50 mg total) by mouth every 6 (six) hours as needed. (Patient taking differently: Take 50 mg by mouth every 6 (six) hours as needed for moderate pain or severe pain. ) 20 tablet 0 Past Week at Unknown time  . warfarin (COUMADIN) 2 MG tablet Take 2mg on thursdays.  Take 3mg all other days. (Patient taking differently: Take 2 mg by mouth daily. ) 50 tablet 1 07/09/2018 at 1600   Scheduled: . insulin aspart  0-9 Units Subcutaneous TID WC   Continuous: . sodium chloride    . aztreonam    . metronidazole 500 mg (07/11/18 0002)  . vancomycin      ROS: No recent illness per daughter. Unable to obtain a detailed ROS due to patient somnolence.   Physical Examination: Blood pressure (!) 180/151, pulse 80, temperature 98 F (36.7 C), temperature  source Oral, resp. rate 17, weight 76.1 kg, SpO2 98 %.  HEENT: [place]/AT. Edentulous. No neck stiffness. Oral mucosa with decreased hydration.  Lungs: Respirations unlabored Ext: Warm and well perfused.   Neurologic Examination: Mental Status: Somnolent. When awake, her speech output is minimal but without gross expressive aphasia. Has difficulty naming objects. Has difficulty following simple motor commands, requiring frequent repetition. Poor eye contact and does not attend well to external stimuli. Disoriented and confused, but not agitated.  Cranial Nerves: II:  Does not cooperate with visual field testing but will fixate and track examiner's face briefly. PERRL.  III,IV, VI: No ptosis. Horizontal versions intact with no nystagmus noted.  V,VII: Mild weakness LLQ of face. Reacts to tactile stimuli bilaterally.  VIII: hearing intact to some questions.  IX,X: Hypophonic speech XI: No definite asymmetry XII:   Does not protrude tongue to command.  Motor: Does not follow commands for motor testing.  When passively raised, LUE slowly drops to bed before RUE Will withdraw BLE to noxious plantar stimulation but does not raise antigravity. No definite asymmetry to BLE.  Increased tone bilateral lower extremities.  Sensory: Moans to arm pinch bilaterally.  Withdraws BLE to noxious weakly.  Deep Tendon Reflexes:  1+ BUE and patellae. 0 achilles. No asymmetry Plantars: Tonically upgoing toes bilaterally.  Cerebellar/Gait: Unable to assess   Results for orders placed or performed during the hospital encounter of 07/10/18 (from the past 48 hour(s))  Urine rapid drug screen (hosp performed)     Status: None   Collection Time: 07/10/18  1:31 PM  Result Value Ref Range   Opiates NONE DETECTED NONE DETECTED   Cocaine NONE DETECTED NONE DETECTED   Benzodiazepines NONE DETECTED NONE DETECTED   Amphetamines NONE DETECTED NONE DETECTED   Tetrahydrocannabinol NONE DETECTED NONE DETECTED   Barbiturates  NONE DETECTED NONE DETECTED    Comment: (NOTE) DRUG SCREEN FOR MEDICAL PURPOSES ONLY.  IF CONFIRMATION IS NEEDED FOR ANY PURPOSE, NOTIFY LAB WITHIN 5 DAYS. LOWEST DETECTABLE LIMITS FOR URINE DRUG SCREEN Drug Class                     Cutoff (ng/mL) Amphetamine and metabolites    1000 Barbiturate and metabolites    200 Benzodiazepine                 950 Tricyclics and metabolites     300 Opiates and metabolites        300 Cocaine and metabolites        300 THC                            50 Performed at [facility], [address] [place], [place] [address]   Urinalysis, Routine w reflex microscopic     Status: Abnormal   Collection Time: 07/10/18  1:31 PM  Result Value Ref Range   Color, Urine YELLOW YELLOW   APPearance CLOUDY (A) CLEAR   Specific Gravity, Urine 1.008 1.005 - 1.030   pH 6.0 5.0 - 8.0   Glucose, UA 50 (A) NEGATIVE mg/dL   Hgb urine dipstick SMALL (A) NEGATIVE   Bilirubin Urine NEGATIVE NEGATIVE   Ketones, ur NEGATIVE NEGATIVE mg/dL   Protein, ur NEGATIVE NEGATIVE mg/dL   Nitrite NEGATIVE NEGATIVE   Leukocytes, UA NEGATIVE NEGATIVE   WBC, UA 0-5 0 - 5 WBC/hpf   Bacteria, UA NONE SEEN NONE SEEN   Mucus PRESENT     Comment: Performed at [facility], [address] [place], [place] [address]  CBG monitoring, ED     Status: Abnormal   Collection Time: 07/10/18  1:41 PM  Result Value Ref Range   Glucose-Capillary 256 (H) 70 - 99 mg/dL  Ethanol     Status: None   Collection Time: 07/10/18  1:52 PM  Result Value Ref Range   Alcohol, Ethyl (B) <10 <10 mg/dL    Comment: (NOTE) Lowest detectable limit for serum alcohol is 10 mg/dL. For medical purposes only. Performed at [facility], [address], [place], [place] [address]   Protime-INR     Status: Abnormal   Collection Time: 07/10/18  1:52 PM  Result Value Ref Range   Prothrombin Time 15.4 (H) 11.4 - 15.2 seconds   INR 1.23     Comment: Performed  at [facility], [address] [place], [place]  [address]  APTT     Status: None   Collection Time: 07/10/18  1:52 PM  Result Value Ref Range   aPTT 26 24 - 36 seconds    Comment: Performed at [facility], [address] [place], [place] [address]  CBC     Status: Abnormal   Collection Time: 07/10/18  1:52 PM  Result Value Ref Range   WBC 11.2 (H) 4.0 - 10.5 K/uL   RBC 4.02 3.87 - 5.11 MIL/uL   Hemoglobin 10.9 (L) 12.0 - 15.0 g/dL   HCT 34.6 (L) 36.0 - 46.0 %   MCV 86.1 80.0 - 100.0 fL   MCH 27.1 26.0 - 34.0 pg   MCHC 31.5 30.0 - 36.0 g/dL   RDW 15.3 11.5 - 15.5 %   Platelets 340 150 - 400 K/uL   nRBC 0.0 0.0 - 0.2 %    Comment: Performed at [facility], [address] [place], [place] [address]  Differential     Status: Abnormal   Collection Time: 07/10/18  1:52 PM  Result Value Ref Range   Neutrophils Relative % 83 %   Neutro Abs 9.4 (H) 1.7 - 7.7 K/uL   Lymphocytes Relative 7 %   Lymphs Abs 0.8 0.7 - 4.0 K/uL   Monocytes Relative 8 %   Monocytes Absolute 0.9 0.1 - 1.0 K/uL   Eosinophils Relative 0 %   Eosinophils Absolute 0.0 0.0 - 0.5 K/uL   Basophils Relative 1 %   Basophils Absolute 0.1 0.0 - 0.1 K/uL   Immature Granulocytes 1 %   Abs Immature Granulocytes 0.06 0.00 - 0.07 K/uL    Comment: Performed at [facility], [address] [place], [place] [address]  Comprehensive metabolic panel     Status: Abnormal   Collection Time: 07/10/18  1:52 PM  Result Value Ref Range   Sodium 135 135 - 145 mmol/L   Potassium 4.0 3.5 - 5.1 mmol/L   Chloride 103 98 - 111 mmol/L   CO2 23 22 - 32 mmol/L   Glucose, Bld 279 (H) 70 - 99 mg/dL   BUN 24 (H) 8 - 23 mg/dL   Creatinine, Ser 1.49 (H) 0.44 - 1.00 mg/dL   Calcium 9.4 8.9 - 10.3 mg/dL   Total Protein 7.7 6.5 - 8.1 g/dL   Albumin 3.4 (L) 3.5 - 5.0 g/dL   AST 25 15 - 41 U/L   ALT 15 0 - 44 U/L   Alkaline Phosphatase 139 (H) 38 - 126 U/L   Total Bilirubin 1.5 (H) 0.3 - 1.2 mg/dL   GFR calc non Af Amer 30 (L) >60 mL/min   GFR calc Af Amer 34 (L) >60 mL/min    Comment:  (NOTE) The eGFR has been calculated using the CKD EPI equation. This calculation has not been validated in all clinical situations. eGFR's persistently <60 mL/min signify possible Chronic Kidney Disease.    Anion gap 9 5 - 15    Comment: Performed at [facility], [address] [place], [place] [address]  Brain natriuretic peptide     Status: Abnormal   Collection Time: 07/10/18  1:52 PM  Result Value Ref Range   B Natriuretic Peptide 604.0 (H) 0.0 - 100.0 pg/mL    Comment: Performed at [facility], [address] [place], [place] [address]  I-stat troponin, ED     Status: None   Collection Time: 07/10/18  2:01 PM  Result Value Ref   Range   Troponin i, poc 0.03 0.00 - 0.08 ng/mL   Comment 3            Comment: Due to the release kinetics of cTnI, a negative result within the first hours of the onset of symptoms does not rule out myocardial infarction with certainty. If myocardial infarction is still suspected, repeat the test at appropriate intervals.   I-Stat Chem 8, ED     Status: Abnormal   Collection Time: 07/10/18  2:03 PM  Result Value Ref Range   Sodium 137 135 - 145 mmol/L   Potassium 4.0 3.5 - 5.1 mmol/L   Chloride 102 98 - 111 mmol/L   BUN 24 (H) 8 - 23 mg/dL   Creatinine, Ser 1.50 (H) 0.44 - 1.00 mg/dL   Glucose, Bld 263 (H) 70 - 99 mg/dL   Calcium, Ion 1.23 1.15 - 1.40 mmol/L   TCO2 23 22 - 32 mmol/L   Hemoglobin 11.2 (L) 12.0 - 15.0 g/dL   HCT 33.0 (L) 36.0 - 46.0 %  Procalcitonin - Baseline     Status: None   Collection Time: 07/10/18  3:15 PM  Result Value Ref Range   Procalcitonin <0.10 ng/mL    Comment:        Interpretation: PCT (Procalcitonin) <= 0.5 ng/mL: Systemic infection (sepsis) is not likely. Local bacterial infection is possible. (NOTE)       Sepsis PCT Algorithm           Lower Respiratory Tract                                      Infection PCT Algorithm    ----------------------------     ----------------------------         PCT < 0.25  ng/mL                PCT < 0.10 ng/mL         Strongly encourage             Strongly discourage   discontinuation of antibiotics    initiation of antibiotics    ----------------------------     -----------------------------       PCT 0.25 - 0.50 ng/mL            PCT 0.10 - 0.25 ng/mL               OR       >80% decrease in PCT            Discourage initiation of                                            antibiotics      Encourage discontinuation           of antibiotics    ----------------------------     -----------------------------         PCT >= 0.50 ng/mL              PCT 0.26 - 0.50 ng/mL               AND        <80% decrease in PCT             Encourage initiation of  antibiotics       Encourage continuation           of antibiotics    ----------------------------     -----------------------------        PCT >= 0.50 ng/mL                  PCT > 0.50 ng/mL               AND         increase in PCT                  Strongly encourage                                      initiation of antibiotics    Strongly encourage escalation           of antibiotics                                     -----------------------------                                           PCT <= 0.25 ng/mL                                                 OR                                        > 80% decrease in PCT                                     Discontinue / Do not initiate                                             antibiotics Performed at [facility], [address] [place], [place] [address]   Lipase, blood     Status: None   Collection Time: 07/10/18  3:15 PM  Result Value Ref Range   Lipase 32 11 - 51 U/L    Comment: Performed at [facility], [address] [place], [place] [address]  Lactic acid, plasma     Status: None   Collection Time: 07/10/18  3:17 PM  Result Value Ref Range   Lactic Acid, Venous 1.9 0.5 - 1.9 mmol/L    Comment:  Performed at [facility], [address] [place], [place] [address]  CBG monitoring, ED     Status: Abnormal   Collection Time: 07/10/18  9:28 PM  Result Value Ref Range   Glucose-Capillary 243 (H) 70 - 99 mg/dL  Glucose, capillary     Status: Abnormal   Collection Time: 07/11/18 12:08 AM  Result Value Ref Range   Glucose-Capillary 187 (H) 70 - 99 mg/dL   Ct Abdomen Pelvis   Wo Contrast  Result Date: 07/10/2018 CLINICAL DATA:  Generalized abdominal pain and tenderness EXAM: CT ABDOMEN AND PELVIS WITHOUT CONTRAST TECHNIQUE: Multidetector CT imaging of the abdomen and pelvis was performed following the standard protocol without IV contrast. COMPARISON:  Renal ultrasound from 2012 by report FINDINGS: Lower chest: No focal infiltrate is seen. Minimal atelectatic changes are noted. Coronary calcifications are seen. Hepatobiliary: No focal liver abnormality is seen. Status post cholecystectomy. No biliary dilatation. Common bile duct is prominent consistent with the post cholecystectomy state. Pancreas: Unremarkable. No pancreatic ductal dilatation or surrounding inflammatory changes. Spleen: Normal in size without focal abnormality. Adrenals/Urinary Tract: Adrenal glands are within normal limits bilaterally. The kidneys are well visualized without renal calculi. Rounded hypodensity is noted within the left kidney consistent with a cyst. This is stable by report from 2012. the bladder is well distended. A small focus of air is noted within the bladder likely related to instrumentation. Clinical correlation is recommended. Stomach/Bowel: Scattered diverticular change of the colon is noted without evidence of diverticulitis. The appendix is not well visualized although no inflammatory changes to suggest appendicitis are seen. Small bowel and stomach are unremarkable. Vascular/Lymphatic: Aortic atherosclerosis. No enlarged abdominal or pelvic lymph nodes. Reproductive: Status post hysterectomy. No adnexal masses.  Other: No abdominal wall hernia or abnormality. No abdominopelvic ascites. Musculoskeletal: Degenerative changes of the lumbar spine are seen. Old compression deformities are seen at L2 and L4. No acute bony abnormality is seen. IMPRESSION: Chronic changes as described above. No acute abnormality is identified. Electronically Signed   By: Mark  Lukens M.D.   On: 07/10/2018 19:15   Ct Head Wo Contrast  Result Date: 07/10/2018 CLINICAL DATA:  Left-sided weakness and slurred speech EXAM: CT HEAD WITHOUT CONTRAST TECHNIQUE: Contiguous axial images were obtained from the base of the skull through the vertex without intravenous contrast. COMPARISON:  June 18, 2018 FINDINGS: Brain: There is mild diffuse atrophy stable. There is no intracranial mass, hemorrhage, extra-axial fluid collection, or midline shift. There is new cytotoxic edema involving the right frontal-temporal junction which includes portions of the lateral anterior right lentiform nucleus, claustrum, anterior aspect of the right extreme capsule, and a portion of the right insular cortex. Brain parenchyma in the nearby periphery of the right posterior frontal and anterior temporal lobe regions involved as well with this apparent acute infarct. Elsewhere, there is evidence of a prior infarct in the mid left frontal lobe. There is evidence of a prior infarct in the medial right occipital lobe. There is also evidence of a prior infarct involving a portion of the anteromedial left occipital lobe. There is periventricular small vessel disease in the centra semiovale bilaterally. Vascular: No evident hyperdense vessel. There is calcification in each carotid siphon region. Skull: Bony calvarium appears intact. Sinuses/Orbits: There is opacification of the left sphenoid sinus with mild calcification in this area, stable. There is mucosal thickening in several ethmoid air cells. There is incomplete visualization of a retention cyst in the posterior right  maxillary antrum. Orbits appear symmetric bilaterally. Other: Mastoid air cells are clear. IMPRESSION: 1. Apparent acute infarct with cytotoxic edema at the right posterior frontal-anterior temporal junction with involvement of portions of the anterolateral right basal ganglia, claustrum, extreme capsule, and insular cortex as well as brain parenchyma in posterior right frontal and anterior right temporal lobes. 2.  No mass or hemorrhage evident.  Underlying atrophy is stable. 3. Prior bilateral occipital lobe and left frontal lobe infarcts noted. There is periventricular small vessel disease. 4.    Foci of arterial vascular calcification noted. 5.  Multifocal paranasal sinus disease. Electronically Signed   By: William  Woodruff III M.D.   On: 07/10/2018 14:29   Mr Brain W And Wo Contrast  Result Date: 07/10/2018 CLINICAL DATA:  Rule out brain abscess. Left-sided facial droop and slurred speech. EXAM: MRI HEAD WITHOUT AND WITH CONTRAST TECHNIQUE: Multiplanar, multiecho pulse sequences of the brain and surrounding structures were obtained without and with intravenous contrast. CONTRAST:  7 mL Gadovist IV COMPARISON:  CT head 07/10/2018 FINDINGS: Brain: Image quality degraded by motion Acute hemorrhagic infarct in the right frontal operculum extending into the anterior insula. Acute infarct also present in the parietal operculum extending into the frontal parietal white matter. Generalized atrophy. Chronic infarct left frontal lobe. Mild chronic microvascular ischemia. Normal enhancement.  Negative for abscess or mass. Vascular: Normal arterial flow voids Skull and upper cervical spine: Negative Sinuses/Orbits: Mild mucosal edema paranasal sinuses. Bilateral cataract surgery Other: None IMPRESSION: Acute infarct right MCA territory. Hemorrhagic infarct right frontal operculum. Nonhemorrhagic infarct in the right parietal operculum. Negative for abscess. Electronically Signed   By: Charles  Clark M.D.   On:  07/10/2018 16:14   Dg Chest Portable 1 View  Result Date: 07/10/2018 CLINICAL DATA:  84 y/o  F; fever. EXAM: PORTABLE CHEST 1 VIEW COMPARISON:  06/18/2018 chest radiograph FINDINGS: Normal cardiac silhouette. Aortic atherosclerosis with calcification. Diffusely increased reticular opacities. No focal consolidation. No pleural effusion or pneumothorax. No acute osseous abnormality is evident. IMPRESSION: Diffusely increased reticular opacities may represent interstitial edema or atypical pneumonia. No consolidation. Electronically Signed   By: Lance  Furusawa-Stratton M.D.   On: 07/10/2018 15:05    Assessment: 84 y.o. female presenting with acute confusion and tremor. MRI reveals an acute right frontal operculum ischemic infarction with petechial hemorrhagic transformation.  1. MRI brain at OSH revealed a subacute infarct in the right MCA territory involving the right frontal operculum, with petechial hemorrhagic transformation. Also noted was a nonhemorrhagic infarct in the right parietal operculum. An old left frontal infarct, an old right occipital lobe infarct and diffuse cerebral atrophy were also seen.  2. Exam reveals a somnolent, demented patient with LUE weakness and subtle left facial droop.  3. Stroke Risk Factors - atrial fibrillation, DM, HTN and HLD  Plan: 1. HgbA1c, fasting lipid panel 2. MRA of the brain without contrast 3. PT consult, OT consult, Speech consult 4. Echocardiogram 5. Carotid dopplers 6. If not a falls risk, would restart anticoagulation after 7-14 days to allow petechial hemorrhagic transformation of the right frontal lobe stroke to stabilize.  7. DVT prophylaxis with SCDs 8. Telemetry monitoring 9. Frequent neuro checks 10. Out of permissive HTN time window based upon estimated age of the infarction on MRI and also not a candidate due to the petechial hemorrhage. SBP goal of 120-140.     @Electronically signed: Dr.  @  07/11/2018, 2:21 AM    

## 2018-07-11 NOTE — Progress Notes (Signed)
OT Cancellation Note  Patient Details Name: Julie Carpenter MRN: [id number] DOB: Feb 16, 1926   Cancelled Treatment:    Reason Eval/Treat Not Completed: Patient at procedure or test/ unavailable(CT)    Jeri Modena, OTR/L  Acute Rehabilitation Services Pager: [phone] Office: [phone] .   Parke Poisson B 07/11/2018, 2:29 PM

## 2018-07-11 NOTE — Evaluation (Signed)
Physical Therapy Evaluation Patient Details Name: Julie Carpenter MRN: [id number] DOB: [birth year] Today's Date: 07/11/2018   History of Present Illness  Pt is a 84 y/o female admitted from her ALF secondary to confusion. MRI revealed an acute R MCA infarct. PMH including but not limited to a-fib, DM and HTN.    Clinical Impression  Pt presented supine in bed with HOB elevated, awake and willing to participate in therapy session. Prior to admission, since her recent falls she has been needing a w/c and requiring assistance with transfers and ADLs. Pt currently requires mod A x2 for bed mobility and transfers. Pt would continue to benefit from skilled physical therapy services at this time while admitted and after d/c to address the below listed limitations in order to improve overall safety and independence with functional mobility.     Follow Up Recommendations SNF    Equipment Recommendations  None recommended by PT    Recommendations for Other Services       Precautions / Restrictions Precautions Precautions: Fall Restrictions Weight Bearing Restrictions: No Other Position/Activity Restrictions: per family - pt with a recent L wrist fx ~2 weeks ago, assumed NWB      Mobility  Bed Mobility Overal bed mobility: Needs Assistance Bed Mobility: Supine to Sit     Supine to sit: Mod assist;+2 for physical assistance     General bed mobility comments: increased time and effort, pt able to initiate bilateral LE movement towards EOB, assistance for trunk elevation with use of bed pads to position pt's hips  Transfers Overall transfer level: Needs assistance Equipment used: 2 person hand held assist Transfers: Sit to/from [facility] Sit to Stand: Mod assist;+2 physical assistance Stand pivot transfers: Mod assist;+2 physical assistance       General transfer comment: assist for rise into standing from EOB and for pivot to chair towards pt's L  side  Ambulation/Gait             General Gait Details: deferred  Stairs            Wheelchair Mobility    Modified Rankin (Stroke Patients Only) Modified Rankin (Stroke Patients Only) Pre-Morbid Rankin Score: Moderately severe disability Modified Rankin: Severe disability     Balance Overall balance assessment: Needs assistance Sitting-balance support: Feet supported Sitting balance-Leahy Scale: Fair     Standing balance support: Bilateral upper extremity supported Standing balance-Leahy Scale: Poor                               Pertinent Vitals/Pain Pain Assessment: No/denies pain    Home Living Family/patient expects to be discharged to:: Assisted living               Home Equipment: Wheelchair - manual      Prior Function Level of Independence: Needs assistance   Gait / Transfers Assistance Needed: since her recent falls, pt has been using a w/c for mobility, requires assistance with transfers  ADL's / Homemaking Assistance Needed: requires assistance        Hand Dominance        Extremity/Trunk Assessment   Upper Extremity Assessment Upper Extremity Assessment: Generalized weakness    Lower Extremity Assessment Lower Extremity Assessment: Generalized weakness    Cervical / Trunk Assessment Cervical / Trunk Assessment: Kyphotic  Communication   Communication: No difficulties  Cognition Arousal/Alertness: Awake/alert Behavior During Therapy: WFL for tasks assessed/performed Overall Cognitive Status: Impaired/Different from  baseline Area of Impairment: Following commands;Safety/judgement;Problem solving;Orientation                 Orientation Level: Disoriented to;Situation     Following Commands: Follows one step commands with increased time Safety/Judgement: Decreased awareness of deficits;Decreased awareness of safety   Problem Solving: Slow processing;Difficulty sequencing;Requires verbal cues         General Comments      Exercises     Assessment/Plan    PT Assessment Patient needs continued PT services  PT Problem List Decreased strength;Decreased balance;Decreased mobility;Decreased coordination;Decreased cognition;Decreased knowledge of use of DME;Decreased safety awareness;Decreased knowledge of precautions       PT Treatment Interventions DME instruction;Gait training;Therapeutic exercise;Balance training;Functional mobility training;Therapeutic activities;Cognitive remediation;Neuromuscular re-education;Patient/family education    PT Goals (Current goals can be found in the Care Plan section)  Acute Rehab PT Goals Patient Stated Goal: to get better PT Goal Formulation: With patient/family Time For Goal Achievement: 07/25/18 Potential to Achieve Goals: Good    Frequency Min 3X/week   Barriers to discharge        Co-evaluation               AM-PAC PT "6 Clicks" Daily Activity  Outcome Measure Difficulty turning over in bed (including adjusting bedclothes, sheets and blankets)?: Unable Difficulty moving from lying on back to sitting on the side of the bed? : Unable Difficulty sitting down on and standing up from a chair with arms (e.g., wheelchair, bedside commode, etc,.)?: Unable Help needed moving to and from a bed to chair (including a wheelchair)?: A Lot Help needed walking in hospital room?: A Lot Help needed climbing 3-5 steps with a railing? : Total 6 Click Score: 8    End of Session Equipment Utilized During Treatment: Gait belt Activity Tolerance: Patient tolerated treatment well Patient left: in chair;with call bell/phone within reach;with chair alarm set;with family/visitor present Nurse Communication: Mobility status PT Visit Diagnosis: Other abnormalities of gait and mobility (R26.89)    Time: 6606-3016 PT Time Calculation (min) (ACUTE ONLY): 27 min   Charges:   PT Evaluation $PT Eval Moderate Complexity: 1 Mod PT  Treatments $Therapeutic Activity: 8-22 mins        Sherie Don, PT, DPT  Acute Rehabilitation Services Pager [phone] Office [place] 07/11/2018, 12:43 PM

## 2018-07-12 ENCOUNTER — Inpatient Hospital Stay (HOSPITAL_COMMUNITY): Payer: Medicare Other

## 2018-07-12 DIAGNOSIS — I4891 Unspecified atrial fibrillation: Secondary | ICD-10-CM

## 2018-07-12 DIAGNOSIS — I639 Cerebral infarction, unspecified: Secondary | ICD-10-CM

## 2018-07-12 LAB — TSH: TSH: 0.889 u[IU]/mL (ref 0.350–4.500)

## 2018-07-12 LAB — BASIC METABOLIC PANEL
ANION GAP: 4 — AB (ref 5–15)
BUN: 24 mg/dL — ABNORMAL HIGH (ref 8–23)
CO2: 21 mmol/L — ABNORMAL LOW (ref 22–32)
CREATININE: 1.53 mg/dL — AB (ref 0.44–1.00)
Calcium: 9 mg/dL (ref 8.9–10.3)
Chloride: 115 mmol/L — ABNORMAL HIGH (ref 98–111)
GFR, EST AFRICAN AMERICAN: 33 mL/min — AB (ref >60)
GFR, EST NON AFRICAN AMERICAN: 29 mL/min — AB (ref >60)
Glucose, Bld: 153 mg/dL — ABNORMAL HIGH (ref 70–99)
Potassium: 3.4 mmol/L — ABNORMAL LOW (ref 3.5–5.1)
Sodium: 140 mmol/L (ref 135–145)

## 2018-07-12 LAB — GLUCOSE, CAPILLARY
GLUCOSE-CAPILLARY: 137 mg/dL — AB (ref 70–99)
GLUCOSE-CAPILLARY: 208 mg/dL — AB (ref 70–99)
Glucose-Capillary: 179 mg/dL — ABNORMAL HIGH (ref 70–99)
Glucose-Capillary: 137 mg/dL — ABNORMAL HIGH (ref 70–99)

## 2018-07-12 LAB — CBC
HCT: 32.6 % — ABNORMAL LOW (ref 36.0–46.0)
Hemoglobin: 9.8 g/dL — ABNORMAL LOW (ref 12.0–15.0)
MCH: 26.8 pg (ref 26.0–34.0)
MCHC: 30.1 g/dL (ref 30.0–36.0)
MCV: 89.1 fL (ref 80.0–100.0)
Platelets: 280 10*3/uL (ref 150–400)
RBC: 3.66 MIL/uL — ABNORMAL LOW (ref 3.87–5.11)
RDW: 15.6 % — AB (ref 11.5–15.5)
WBC: 8.6 10*3/uL (ref 4.0–10.5)
nRBC: 0 % (ref 0.0–0.2)

## 2018-07-12 LAB — ECHOCARDIOGRAM COMPLETE: Weight: 2684.32 oz

## 2018-07-12 LAB — VITAMIN B12: VITAMIN B 12: 250 pg/mL (ref 180–914)

## 2018-07-12 LAB — LIPID PANEL
Cholesterol: 173 mg/dL (ref 0–200)
HDL: 38 mg/dL — ABNORMAL LOW (ref >40)
LDL Cholesterol: 123 mg/dL — ABNORMAL HIGH (ref 0–99)
Total CHOL/HDL Ratio: 4.6 RATIO
Triglycerides: 60 mg/dL (ref <150)
VLDL: 12 mg/dL (ref 0–40)

## 2018-07-12 LAB — HEMOGLOBIN A1C
HEMOGLOBIN A1C: 7.9 % — AB (ref 4.8–5.6)
MEAN PLASMA GLUCOSE: 180.03 mg/dL

## 2018-07-12 MED ORDER — VITAMIN B-12 100 MCG PO TABS
500.0000 ug | ORAL_TABLET | Freq: Every day | ORAL | Status: DC
  Administered 2018-07-15: 500 ug via ORAL
  Filled 2018-07-12 (×2): qty 5

## 2018-07-12 MED ORDER — SODIUM CHLORIDE 0.9 % IV SOLN
1.0000 g | Freq: Three times a day (TID) | INTRAVENOUS | Status: DC
  Administered 2018-07-12 – 2018-07-13 (×3): 1 g via INTRAVENOUS
  Filled 2018-07-12 (×4): qty 1

## 2018-07-12 MED ORDER — POTASSIUM CHLORIDE 10 MEQ/100ML IV SOLN
10.0000 meq | INTRAVENOUS | Status: AC
  Administered 2018-07-12 (×3): 10 meq via INTRAVENOUS
  Filled 2018-07-12 (×3): qty 100

## 2018-07-12 NOTE — Progress Notes (Signed)
VASCULAR LAB PRELIMINARY  PRELIMINARY  PRELIMINARY  PRELIMINARY  Carotid duplex completed.    Preliminary report:  1-39% ICA stenosis. Vertebral artery flow is antegrade.   Madine Sarr, RVT 07/12/2018, 11:21 AM

## 2018-07-12 NOTE — Plan of Care (Signed)
Patient stable, discussed POC with patient, agreeable with plan, denies question/concerns at this time.  

## 2018-07-12 NOTE — Progress Notes (Signed)
[facility] PROGRESS NOTE  Julie Carpenter YTK:[id number] DOB: Feb 03, 1926 DOA: 07/10/2018  PCP: Vidal Schwalbe, MD  Brief History/Interval Summary:  84 y.o. female with medical history significant of atrial fibrillation on anticoagulation with warfarin, CKD, type 2 diabetes, hypertension, hyperlipidemia, prior VTE presented to [facility] from her nursing home via EMS for evaluation of tremors.    Patient was very somnolent so history was limited.  There was also concern for left-sided facial droop and left-sided weakness.  Patient also was noted to have a fever with a temperature of 101.2 F.  MRI was done and reviewed an acute infarct in the right MCA territory.  Hemorrhagic infarct was noted in the right frontal operculum and a nonhemorrhagic infarct in the right parietal operculum.  Patient was transferred to [facility] for further evaluation.    Reason for Visit: Acute stroke  Consultants: Neurology  Procedures:  Transthoracic echocardiogram is pending Carotid Dopplers pending  Antibiotics: Vancomycin, aztreonam and metronidazole  Subjective/Interval History: Patient feels better this morning.  She is much more awake alert.  She denies any pain.  Her granddaughter is at the bedside.    ROS: Denies any headaches.  Objective:  Vital Signs  Vitals:   07/12/18 0815 07/12/18 0825 07/12/18 0845 07/12/18 0854  BP: (!) 177/93 (!) 172/103 (!) 178/108 (!) 159/102  Pulse: 81 95 99 91  Resp: 20 16 19  (!) 22  Temp: 98.8 F (37.1 C)     TempSrc: Oral     SpO2: 100% 96% 97% 95%  Weight:       No intake or output data in the 24 hours ending 07/12/18 1055 Filed Weights   07/10/18 1326 07/10/18 2301  Weight: 74.4 kg 76.1 kg    General appearance: She is awake alert.  Much more responsive than yesterday.  Following commands. Resp: Normal effort noted at rest.  Diminished air entry at the bases.  Few crackles noted on the right.  No wheezing.  No rhonchi.     Cardio: S1-S2 is irregularly irregular.  No S3-S4.  No rubs or bruit.  Systolic murmur appreciated over the precordium.   GI: Abdomen is soft.  Nontender nondistended.  Bowel sounds are present normal.  No masses organomegaly.   Extremities: No edema noted bilateral lower extremities Neurologic: She has much more awake and alert today.  Tongue is midline.  No obvious facial asymmetry noted.  Improved strength in the left lower extremity compared to yesterday.  4 out of 5.  Strength in the left upper extremity still about 3-4 out of 5.  Better than yesterday.    Lab Results:  Data Reviewed: I have personally reviewed following labs and imaging studies  CBC: Recent Labs  Lab 07/10/18 1352 07/10/18 1403 07/12/18 0458  WBC 11.2*  --  8.6  NEUTROABS 9.4*  --   --   HGB 10.9* 11.2* 9.8*  HCT 34.6* 33.0* 32.6*  MCV 86.1  --  89.1  PLT 340  --  751    Basic Metabolic Panel: Recent Labs  Lab 07/10/18 1352 07/10/18 1403 07/12/18 0458  NA 135 137 140  K 4.0 4.0 3.4*  CL 103 102 115*  CO2 23  --  21*  GLUCOSE 279* 263* 153*  BUN 24* 24* 24*  CREATININE 1.49* 1.50* 1.53*  CALCIUM 9.4  --  9.0    GFR: Estimated Creatinine Clearance: 25.5 mL/min (A) (by C-G formula based on SCr of 1.53 mg/dL (H)).  Liver Function Tests:  Recent Labs  Lab 07/10/18 1352  AST 25  ALT 15  ALKPHOS 139*  BILITOT 1.5*  PROT 7.7  ALBUMIN 3.4*    Recent Labs  Lab 07/10/18 1515  LIPASE 32   Coagulation Profile: Recent Labs  Lab 07/10/18 1352  INR 1.23    CBG: Recent Labs  Lab 07/11/18 0924 07/11/18 1119 07/11/18 1641 07/11/18 2129 07/12/18 0651  GLUCAP 192* 181* 169* 154* 137*     Recent Results (from the past 240 hour(s))  Culture, blood (routine x 2)     Status: None (Preliminary result)   Collection Time: 07/10/18  3:15 PM  Result Value Ref Range Status   Specimen Description BLOOD LEFT ANTECUBITAL  Final   Special Requests   Final    BOTTLES DRAWN AEROBIC AND ANAEROBIC  Blood Culture adequate volume   Culture   Final    NO GROWTH 2 DAYS Performed at [facility], [address], [place], [place] [address]    Report Status PENDING  Incomplete  Culture, blood (routine x 2)     Status: None (Preliminary result)   Collection Time: 07/10/18  4:11 PM  Result Value Ref Range Status   Specimen Description BLOOD  Final   Special Requests NONE  Final   Culture   Final    NO GROWTH 2 DAYS Performed at [facility], [address] [place], [place] [address]    Report Status PENDING  Incomplete      Radiology Studies: Ct Abdomen Pelvis Wo Contrast  Result Date: 07/10/2018 CLINICAL DATA:  Generalized abdominal pain and tenderness EXAM: CT ABDOMEN AND PELVIS WITHOUT CONTRAST TECHNIQUE: Multidetector CT imaging of the abdomen and pelvis was performed following the standard protocol without IV contrast. COMPARISON:  Renal ultrasound from 2012 by report FINDINGS: Lower chest: No focal infiltrate is seen. Minimal atelectatic changes are noted. Coronary calcifications are seen. Hepatobiliary: No focal liver abnormality is seen. Status post cholecystectomy. No biliary dilatation. Common bile duct is prominent consistent with the post cholecystectomy state. Pancreas: Unremarkable. No pancreatic ductal dilatation or surrounding inflammatory changes. Spleen: Normal in size without focal abnormality. Adrenals/Urinary Tract: Adrenal glands are within normal limits bilaterally. The kidneys are well visualized without renal calculi. Rounded hypodensity is noted within the left kidney consistent with a cyst. This is stable by report from 2012. the bladder is well distended. A small focus of air is noted within the bladder likely related to instrumentation. Clinical correlation is recommended. Stomach/Bowel: Scattered diverticular change of the colon is noted without evidence of diverticulitis. The appendix is not well visualized although no inflammatory changes to suggest appendicitis are  seen. Small bowel and stomach are unremarkable. Vascular/Lymphatic: Aortic atherosclerosis. No enlarged abdominal or pelvic lymph nodes. Reproductive: Status post hysterectomy. No adnexal masses. Other: No abdominal wall hernia or abnormality. No abdominopelvic ascites. Musculoskeletal: Degenerative changes of the lumbar spine are seen. Old compression deformities are seen at L2 and L4. No acute bony abnormality is seen. IMPRESSION: Chronic changes as described above. No acute abnormality is identified. Electronically Signed   By: Inez Catalina M.D.   On: 07/10/2018 19:15   Ct Head Wo Contrast  Result Date: 07/10/2018 CLINICAL DATA:  Left-sided weakness and slurred speech EXAM: CT HEAD WITHOUT CONTRAST TECHNIQUE: Contiguous axial images were obtained from the base of the skull through the vertex without intravenous contrast. COMPARISON:  June 18, 2018 FINDINGS: Brain: There is mild diffuse atrophy stable. There is no intracranial mass, hemorrhage, extra-axial fluid collection, or midline shift. There  is new cytotoxic edema involving the right frontal-temporal junction which includes portions of the lateral anterior right lentiform nucleus, claustrum, anterior aspect of the right extreme capsule, and a portion of the right insular cortex. Brain parenchyma in the nearby periphery of the right posterior frontal and anterior temporal lobe regions involved as well with this apparent acute infarct. Elsewhere, there is evidence of a prior infarct in the mid left frontal lobe. There is evidence of a prior infarct in the medial right occipital lobe. There is also evidence of a prior infarct involving a portion of the anteromedial left occipital lobe. There is periventricular small vessel disease in the centra semiovale bilaterally. Vascular: No evident hyperdense vessel. There is calcification in each carotid siphon region. Skull: Bony calvarium appears intact. Sinuses/Orbits: There is opacification of the left  sphenoid sinus with mild calcification in this area, stable. There is mucosal thickening in several ethmoid air cells. There is incomplete visualization of a retention cyst in the posterior right maxillary antrum. Orbits appear symmetric bilaterally. Other: Mastoid air cells are clear. IMPRESSION: 1. Apparent acute infarct with cytotoxic edema at the right posterior frontal-anterior temporal junction with involvement of portions of the anterolateral right basal ganglia, claustrum, extreme capsule, and insular cortex as well as brain parenchyma in posterior right frontal and anterior right temporal lobes. 2.  No mass or hemorrhage evident.  Underlying atrophy is stable. 3. Prior bilateral occipital lobe and left frontal lobe infarcts noted. There is periventricular small vessel disease. 4.  Foci of arterial vascular calcification noted. 5.  Multifocal paranasal sinus disease. Electronically Signed   By: Lowella Grip III M.D.   On: 07/10/2018 14:29   Mr Jodene Nam Head Wo Contrast  Result Date: 07/11/2018 CLINICAL DATA:  Right MCA stroke EXAM: MRA HEAD WITHOUT CONTRAST TECHNIQUE: Angiographic images of the Circle of Willis were obtained using MRA technique without intravenous contrast. COMPARISON:  MRI head 07/10/2018 FINDINGS: Internal carotid artery widely patent bilaterally. Anterior and middle cerebral arteries patent bilaterally without stenosis or occlusion Right vertebral artery dominant and widely patent. Small left vertebral artery patent without stenosis PICA patent bilaterally. Basilar widely patent. Superior cerebellar and posterior cerebral arteries patent bilaterally. Mild stenosis left posterior cerebral artery. IMPRESSION: In this patient with acute right MCA infarct, the right middle cerebral arteries widely patent Mild stenosis left posterior cerebral artery. Electronically Signed   By: Franchot Gallo M.D.   On: 07/11/2018 15:19   Mr Jeri Cos And Wo Contrast  Result Date: 07/10/2018 CLINICAL  DATA:  Rule out brain abscess. Left-sided facial droop and slurred speech. EXAM: MRI HEAD WITHOUT AND WITH CONTRAST TECHNIQUE: Multiplanar, multiecho pulse sequences of the brain and surrounding structures were obtained without and with intravenous contrast. CONTRAST:  7 mL Gadovist IV COMPARISON:  CT head 07/10/2018 FINDINGS: Brain: Image quality degraded by motion Acute hemorrhagic infarct in the right frontal operculum extending into the anterior insula. Acute infarct also present in the parietal operculum extending into the frontal parietal white matter. Generalized atrophy. Chronic infarct left frontal lobe. Mild chronic microvascular ischemia. Normal enhancement.  Negative for abscess or mass. Vascular: Normal arterial flow voids Skull and upper cervical spine: Negative Sinuses/Orbits: Mild mucosal edema paranasal sinuses. Bilateral cataract surgery Other: None IMPRESSION: Acute infarct right MCA territory. Hemorrhagic infarct right frontal operculum. Nonhemorrhagic infarct in the right parietal operculum. Negative for abscess. Electronically Signed   By: Franchot Gallo M.D.   On: 07/10/2018 16:14   Dg Chest Portable 1 View  Result Date: 07/10/2018  CLINICAL DATA:  84 y/o  F; fever. EXAM: PORTABLE CHEST 1 VIEW COMPARISON:  06/18/2018 chest radiograph FINDINGS: Normal cardiac silhouette. Aortic atherosclerosis with calcification. Diffusely increased reticular opacities. No focal consolidation. No pleural effusion or pneumothorax. No acute osseous abnormality is evident. IMPRESSION: Diffusely increased reticular opacities may represent interstitial edema or atypical pneumonia. No consolidation. Electronically Signed   By: Kristine Garbe M.D.   On: 07/10/2018 15:05     Medications:  Scheduled: . aspirin EC  81 mg Oral Daily   Or  . aspirin  300 mg Rectal Daily  . insulin aspart  0-9 Units Subcutaneous TID WC   Continuous: . aztreonam 1 g (07/12/18 0328)  . metronidazole 500 mg (07/12/18  0805)  . vancomycin     ION:[id number] **OR** acetaminophen (TYLENOL) oral liquid 160 mg/5 mL **OR** acetaminophen, labetalol  Assessment/Plan:    Acute stroke Multiple infarcts seen on MRI. One of the infarcts has an hemorrhagic component.  Patient transferred to [facility] for higher level of care.  Neurology is following.  Echocardiogram and carotid Doppler pending.  LDL 123.  Hemoglobin A1c is 7.9.  PT OT and speech therapy evaluation is pending.  Continue aspiration precautions.  Patient is on aspirin.  Holding her warfarin due to the component of hemorrhage.  Discussed with Dr. Erlinda Hong this morning.  He has discussed anticoagulation with patient and family.  They are agreeable to Eliquis.  This will need to be started in 3 to 5 days.  Acute metabolic encephalopathy Likely multifactorial including stroke, fever and medications.  Apparently she was given fentanyl at the outside facility.  Avoid narcotic agents.  Mentation has significantly improved.  Continue to monitor closely.  Sepsis secondary to aspiration pneumonia CT scan of the abdomen and pelvis was done as the patient was noted to be tender.  No acute findings noted.  UA does not suggest UTI.  Chest x-ray did suggest aspiration.  Patient was started on broad-spectrum coverage with vancomycin and aztreonam and metronidazole.  She is noted to be allergic to penicillin.  Respiratory status appears to be stable.  Blood cultures are negative so far.  Lactic acid level was 1.9.  Procalcitonin was less than 0.1.  Lipase level was normal.  Discontinue vancomycin.    History of chronic atrial fibrillation Rate is controlled.  Continue to monitor on telemetry.  Patient was on carvedilol at home which is held due to risk for aspiration.  Use IV beta-blocker as needed.  Chads 2 vascular score is 7.  She was on warfarin which is being held due to the hemorrhagic component noted on MRI brain.  As discussed above neurology recommends Eliquis  in 3 to 5 days.  They have discussed this with patient and family.  TSH is normal.  Chronic kidney disease stage IV/hypokalemia Renal function is close to baseline.  Continue to monitor urine output.  Replace potassium.  Diabetes mellitus type 2 with renal complications Continue to monitor CBGs.  HbA1c is 7.9.  SSI.  Essential hypertension Using IV labetalol as needed for blood pressure control with parameters as outlined by neurology.  Hyperlipidemia LDL is 123.  No statin noted on her home medication list.  This will need to be initiated once she is able to take orally.  Normocytic anemia/vitamin B12 deficiency Drop in hemoglobin is likely dilutional.  No evidence for overt bleeding.  Continue to monitor.  Low normal vitamin B12 level noted.  This will be supplemented.   Sacral decubitus stage II  Wound care.  DVT Prophylaxis: SCDs    Code Status: DNR Family Communication: Discussed with the patient's granddaughter Disposition Plan: Management as outlined above.  May need to go to rehab when she is ready for discharge.    LOS: 2 days   [facility] Pager [phone] 07/12/2018, 10:55 AM  If 7PM-7AM, please contact night-coverage at [email or web address], password [email or web address]

## 2018-07-12 NOTE — Progress Notes (Addendum)
MBS completed and full report to follow.  Pt presents with mild oral and severe pharyngeal dysphagia.   Largest deficit is her delay in reflexive swallow and decreased laryngeal closure allowing aspiration/penetration without adequate sensation nor ability to clear.  Pt did cough significantly with gross thin aspiration.  Aspiration of thin, nectar, penetration of honey, puree noted.  Pharyngeal swallow is overall strong but delayed as boluses pool in distal pharynx.    She is aspiration risk regardless of consistency due to her level of delay.  Strategies (chin tuck, head elevation) did not prevent her from laryngeal penetration/aspiration.    If pt to consume diet it should be with accepted aspiration and ongoing aspiration pna risk.    Luanna Salk, [facility] SLP Acute Rehab Services Pager [phone] Office [phone]

## 2018-07-12 NOTE — Clinical Social Work Note (Addendum)
Clinical Social Work Assessment  Patient Details  Name: Julie Carpenter MRN: [id number] Date of Birth: September 30, 1925  Date of referral:  07/12/18               Reason for consult:  Facility Placement                Permission sought to share information with:  Family Supports Permission granted to share information::  Yes, Release of Information Signed  Name::     Airline pilot::     Relationship::  daughter  Contact Information:     Housing/Transportation Living arrangements for the past 2 months:  [place] City of Information:  Adult Children Patient Interpreter Needed:  None Criminal Activity/Legal Involvement Pertinent to Current Situation/Hospitalization:  No - Comment as needed Significant Relationships:  Adult Children Lives with:  Self Do you feel safe going back to the place where you live?  No Need for family participation in patient care:  No (Coment)  Care giving concerns:  Pt is disoriented x4. CSW spoke with pt's daughter at bedside.   Social Worker assessment / plan:  Pt is from [place]. Pt's daughter is agreeable to SNF at d/c. Pt's daughter requesting a list. A list was provided at bedside. CSW will follow up with pt's daughter regarding choice.  Employment status:  Retired Forensic scientist:  Medicare PT Recommendations:  [place] / Referral to community resources:  [place]  Patient/Family's Response to care:  Pt's daughter verbalized understanding of CSW role and expressed appreciation for support. Pt;s daughter denies any concern regarding pt care at this time.   Patient/Family's Understanding of and Emotional Response to Diagnosis, Current Treatment, and Prognosis:  Pt's daughter understanding and realistic regarding pt's physical limitations. Pt's daughter understands the need for pt to go to SNF prior to returning to ALF.  Pt's daughter denies any concern regarding pt's treatment  plan at this time. CSW will continue to provide support and facilitate d/c needs.   Emotional Assessment Appearance:  Appears stated age Attitude/Demeanor/Rapport:  Unable to Assess Affect (typically observed):  Unable to Assess Orientation:  (Disoriented x4) Alcohol / Substance use:  Not Applicable Psych involvement (Current and /or in the community):  No (Comment)  Discharge Needs  Concerns to be addressed:  Care Coordination, Basic Needs Readmission within the last 30 days:  No Current discharge risk:  Dependent with Mobility Barriers to Discharge:  Continued Medical Work up   [employer], LCSW 07/12/2018, 11:15 AM

## 2018-07-12 NOTE — Evaluation (Signed)
Clinical/Bedside Swallow Evaluation Patient Details  Name: Julie Carpenter MRN: [id number] Date of Birth: [birth year]  Today's Date: 07/12/2018 Time: SLP Start Time (ACUTE ONLY): 0739 SLP Stop Time (ACUTE ONLY): 0813 SLP Time Calculation (min) (ACUTE ONLY): 34 min  Past Medical History:  Past Medical History:  Diagnosis Date  . Anemia   . Atrial fibrillation ([place])   . Chronic kidney disease   . Diabetes mellitus without complication ([place])   . DVT (deep venous thrombosis) ([place])   . Hyperlipidemia   . Hypertension   . PE (pulmonary embolism)    Past Surgical History:  Past Surgical History:  Procedure Laterality Date  . ABDOMINAL HYSTERECTOMY    . bladder tack    . CHOLECYSTECTOMY    . COLONOSCOPY N/A 05/03/2013   TFT:DDUKGU MOST LIKELY DUE TO ONE AVM in the ascending colon/Moderate diverticulosis hroughout the entire examined colon/Two POLYPS REMOVED/Small internal hemorrhoids  . ESOPHAGOGASTRODUODENOSCOPY N/A 04/06/2013   Procedure: ESOPHAGOGASTRODUODENOSCOPY (EGD);  Surgeon: Julie Binder, MD;  Location[facility];  Service: Endoscopy;  Laterality: N/A;  8:45  . GIVENS CAPSULE STUDY  04/06/2013   Procedure: GIVENS CAPSULE STUDY;  Surgeon: Julie Binder, MD;  Location: [facility];  Service: Endoscopy;;   HPI:  pt is a 84 yo adm to [facility] - transferred from [employer].  Pt with recent fall with left wrist fx, found to have a CVA with concern for aspiraton pna.  Swallow evaluation ordered.  Pt also with shingles.  CXR concerning for pna.  Pt has been lethargic over the last few days.  .     Assessment / Plan / Recommendation Clinical Impression  Patient currently is presenting with indications of oropharyngeal dysphagia c/b facial and concerns for vagal nerve/glossopharyngeal nerve involvment.  Decreased labial seal noted on left during swallow which likely impacts oral propulsion strength.  Delayed multiple swallows noted with delayed congested cough. Granddaugher Julie Carpenter present  reports pt was not coughing over night until she started po intake.  Given pt's advanced age, weakness and stroke will proceed with MBS prior to po administration.  Educated pt and granddaughter to concerns that pt may be aspiration risk regardless of diet - to which granddaughter stated they were aware and advanced planning has been made.     Informed that if MBS cannot be done today would rec npo x oral moistures, tsps water and mbs next date.   SLP Visit Diagnosis: Dysphagia, oropharyngeal phase (R13.12)    Aspiration Risk  Moderate aspiration risk;Risk for inadequate nutrition/hydration    Diet Recommendation NPO(oral moisture via toothette pending instrumental swallow eval)   Medication Administration: Via alternative means    Other  Recommendations Oral Care Recommendations: Oral care QID   Follow up Recommendations   n/a     Frequency and Duration     n/a       Prognosis    N/a      Swallow Study   General Date of Onset: 07/12/18 HPI: pt is a 84 yo adm to [facility] - transferred from [place].  Pt with recent fall with left wrist fx, found to have a CVA with concern for aspiraton pna.  Swallow evaluation ordered.  Pt also with shingles.  CXR concerning for pna.  Pt has been lethargic over the last few days.  .   Type of Study: Bedside Swallow Evaluation Diet Prior to this Study: NPO Temperature Spikes Noted: No Respiratory Status: Room air History of Recent Intubation: No Behavior/Cognition: Alert;Cooperative;Pleasant  mood Oral Cavity Assessment: Dry Oral Care Completed by SLP: No(recently done by family) Oral Cavity - Dentition: Edentulous Vision: Impaired for self-feeding Self-Feeding Abilities: Needs assist Patient Positioning: Upright in bed Baseline Vocal Quality: Low vocal intensity Volitional Cough: Weak Volitional Swallow: Able to elicit    Oral/Motor/Sensory Function Overall Oral Motor/Sensory Function: Mild impairment Facial ROM: Reduced left;Suspected CN  VII (facial) dysfunction Facial Symmetry: Suspected CN VII (facial) dysfunction Facial Strength: Suspected CN VII (facial) dysfunction;Reduced left Lingual Symmetry: Within Functional Limits Lingual Strength: Reduced Velum: Within Functional Limits(bilateral but sluggish, suspect vp incompetence)   Ice Chips Ice chips: Not tested   Thin Liquid Thin Liquid: Within functional limits Presentation: Spoon    Nectar Thick Nectar Thick Liquid: Impaired Presentation: Cup;Spoon Oral Phase Impairments: Reduced labial seal Oral phase functional implications: Prolonged oral transit Pharyngeal Phase Impairments: Cough - Delayed   Honey Thick Honey Thick Liquid: Not tested   Puree Puree: Impaired Presentation: Spoon Oral Phase Impairments: Reduced labial seal;Reduced lingual movement/coordination Oral Phase Functional Implications: Prolonged oral transit Pharyngeal Phase Impairments: Cough - Delayed   Solid     Solid: Not tested      Julie Carpenter 07/12/2018,8:27 AM  Julie Salk, MS [facility] SLP Acute Rehab Services Pager [phone] Office [phone]

## 2018-07-12 NOTE — Progress Notes (Signed)
STROKE TEAM PROGRESS NOTE   SUBJECTIVE (INTERVAL HISTORY) Her two daughters are at the bedside. Pt more awake alert today and speech also improved. Had TTE and CUS unremarkable. Just had MBS for swallow evaluation.    OBJECTIVE Vitals:   07/12/18 0825 07/12/18 0845 07/12/18 0854 07/12/18 1143  BP: (!) 172/103 (!) 178/108 (!) 159/102 (!) 181/106  Pulse: 95 99 91 91  Resp: 16 19 (!) 22 20  Temp:    99.2 F (37.3 C)  TempSrc:    Oral  SpO2: 96% 97% 95% 99%  Weight:        CBC:  Recent Labs  Lab 07/10/18 1352 07/10/18 1403 07/12/18 0458  WBC 11.2*  --  8.6  NEUTROABS 9.4*  --   --   HGB 10.9* 11.2* 9.8*  HCT 34.6* 33.0* 32.6*  MCV 86.1  --  89.1  PLT 340  --  992    Basic Metabolic Panel:  Recent Labs  Lab 07/10/18 1352 07/10/18 1403 07/12/18 0458  NA 135 137 140  K 4.0 4.0 3.4*  CL 103 102 115*  CO2 23  --  21*  GLUCOSE 279* 263* 153*  BUN 24* 24* 24*  CREATININE 1.49* 1.50* 1.53*  CALCIUM 9.4  --  9.0    Lipid Panel:     Component Value Date/Time   CHOL 173 07/12/2018 0458   CHOL 177 07/24/2015 1429   CHOL 141 02/24/2013 1209   TRIG 60 07/12/2018 0458   TRIG 215 (H) 12/26/2014 0839   TRIG 113 02/24/2013 1209   HDL 38 (L) 07/12/2018 0458   HDL 34 (L) 07/24/2015 1429   HDL 34 (L) 12/26/2014 0839   HDL 34 (L) 02/24/2013 1209   CHOLHDL 4.6 07/12/2018 0458   VLDL 12 07/12/2018 0458   LDLCALC 123 (H) 07/12/2018 0458   LDLCALC 100 (H) 07/24/2015 1429   LDLCALC 77 05/30/2014 0934   LDLCALC 84 02/24/2013 1209   HgbA1c:  Lab Results  Component Value Date   HGBA1C 7.9 (H) 07/12/2018   Urine Drug Screen:     Component Value Date/Time   LABOPIA NONE DETECTED 07/10/2018 1331   COCAINSCRNUR NONE DETECTED 07/10/2018 1331   LABBENZ NONE DETECTED 07/10/2018 1331   AMPHETMU NONE DETECTED 07/10/2018 1331   THCU NONE DETECTED 07/10/2018 1331   LABBARB NONE DETECTED 07/10/2018 1331    Alcohol Level     Component Value Date/Time   ETH <10 07/10/2018 1352     IMAGING  Ct Head Wo Contrast 07/10/2018 IMPRESSION:  1. Apparent acute infarct with cytotoxic edema at the right posterior frontal-anterior temporal junction with involvement of portions of the anterolateral right basal ganglia, claustrum, extreme capsule, and insular cortex as well as brain parenchyma in posterior right frontal and anterior right temporal lobes.  2.  No mass or hemorrhage evident.  Underlying atrophy is stable.  3. Prior bilateral occipital lobe and left frontal lobe infarcts noted. There is periventricular small vessel disease.  4.  Foci of arterial vascular calcification noted.  5.  Multifocal paranasal sinus disease.    Mr Jeri Cos And Wo Contrast 07/10/2018 IMPRESSION:  Acute infarct right MCA territory. Hemorrhagic infarct right frontal operculum. Nonhemorrhagic infarct in the right parietal operculum. Negative for abscess.    Ct Abdomen Pelvis Wo Contrast 07/10/2018 IMPRESSION:  Chronic changes as described above. No acute abnormality is identified.    Dg Chest Portable 1 View 07/10/2018 IMPRESSION:  Diffusely increased reticular opacities may represent interstitial edema or atypical pneumonia. No  consolidation.    MRA head In this patient with acute right MCA infarct, the right middle cerebral arteries widely patent. Mild stenosis left posterior cerebral artery.   Transthoracic Echocardiogram  07/12/2018 Study Conclusions - Left ventricle: The cavity size was normal. There was mild focal   basal hypertrophy of the septum. Systolic function was normal.   The estimated ejection fraction was in the range of 60% to 65%.   Wall motion was normal; there were no regional wall motion   abnormalities. - Mitral valve: Calcified annulus. There was moderate regurgitation   directed centrally. - Left atrium: The atrium was mildly dilated. - Tricuspid valve: There was moderate regurgitation. - Pulmonary arteries: Systolic pressure was mildly increased. PA    peak pressure: 45 mm Hg (S).   Bilateral Carotid Dopplers  07/12/2018 Preliminary report:  1-39% ICA stenosis. Vertebral artery flow is antegrade.    PHYSICAL EXAM  Temp:  [97.8 F (36.6 C)-99.2 F (37.3 C)] 99.2 F (37.3 C) (11/03 1143) Pulse Rate:  [81-102] 91 (11/03 1143) Resp:  [13-22] 20 (11/03 1143) BP: (134-181)/(69-110) 181/106 (11/03 1143) SpO2:  [93 %-100 %] 99 % (11/03 1143)  General - thin built, well developed, not in distress.  Ophthalmologic - fundi not visualized due to noncooperation.  Cardiovascular - irregularly irregular heart rate and rhythm.  Neuro - Awake, alert and following simple commands.  Paucity of speech, but able to answer orientation questions and follows all simple commands, mild dysarthria.  Orientated to people and place, but not orientated to time.  Naming 2/3, and able to repeat simple sentences but dysarthria.  PERRL, EOMI, blinking to visual threat bilaterally.  Left facial droop today more obvious, tongue midline.  Right upper extremity 4/5, left upper extremity 3/5 proximal and distal partially due to pain on the left wrist fracture, bilateral lower extremity 3/5.  DTR 1+, no Babinski.  Sensation symmetrical, coordination not cooperative.  Gait not tested.   ASSESSMENT/PLAN Julie Carpenter is a 84 y.o. female with history of atrial fibrillation on coumadin, DVT, PE, DM, CKD, anemia, dementia, previous strokes, HTN, and HLD presenting with left facial droop, somnolence, elevated BP, AMS, fever, tachycardia, tachypnea, mild leukocytosis, and an abnormal CXR. She did not receive IV t-PA due to anticoagulation and petechial hemorrhagic transformation on MRI.  Stroke:  Acute infarct right MCA territory likely embolic from atrial fibrillation on coumadin but subtherapeutic INR.  CT head  -right frontal subacute infarct, old left frontal infarct  MRI head - Acute infarct right MCA territory. Hemorrhagic infarct right frontal operculum.  Nonhemorrhagic infarct in the right parietal operculum.  MRA head - unremarkable  Carotid Doppler unremarkable  2D Echo  - EF 60 - 65%. No cardiac source of emboli identified.   LDL - 123  HgbA1c - 7.9  UDS neg  VTE prophylaxis - SCDs  Diet - NPO  warfarin daily prior to admission, now on aspirin.  Although MRI showed petechial hemorrhagic conversion, CT did not show evident hyperdense, therefore it is okay to start anticoagulation on 07/16/18 for stroke prevention.  Patient counseled to be compliant with her antithrombotic medications  Ongoing aggressive stroke risk factor management  Therapy recommendations:  SNF  Disposition:  Pending  Chronic AF  As per daughter, she was on Coumadin at the nursing home, INR last week 1.4, did not change Coumadin dose  She was on Xarelto in the past, stopped due to GI bleeding which was cured with colonoscopy and cauterization  Currently on coumadin  without recurrent GI bleeding  However subtherapeutic INR on admission 1.23  Recommend to start eliquis on 07/16/18, given the lower GIB rate of Eliquis than Xarelto or Coumadin.  Hypertension  Stable  SBP goal < 160 mmHg due to hemorrhagic transformation . Long-term BP goal normotensive  Hyperlipidemia  Lipid lowering medication PTA:  none  LDL - 123, goal < 70  Currently NPO - consider pravastatin 20mg  once passed swallow  Continue statin on discharge  Diabetes  HgbA1c - 7.9, goal < 7.0  Uncontrolled  SSI  CBG monitoring  Close PCP follow up at SNF  Dysphagia  Did not pass swallow  NPO now  On IVF  Speech to follow  Had MBS this am - pending report  Fever and possible pneumonia  Tmax 101.2 -> afebrile  UA neg  CXR concerning for pneumonia  Blood culture pending  On Azactam and flagyl.   Vanco discontinued  CT abdomen pelvis - no acute finding  Other Stroke Risk Factors  Advanced age  Hx stroke/TIA  Hx of PE/DVT - on coumadin PTA -  will change to eliquiis  Other Active Problems  CKD III - Creatinine - 1.50->1.53  Mild anemia likely due to CKD Hb 11.2->9.8  Dementia at baseline living in facility  Mild hypokalemia 3.4  Hospital day # 2  Neurology will sign off. Please call with questions. Pt will follow up with stroke clinic NP at [facility] in about 4 weeks. Thanks for the consult.  Rosalin Hawking, MD PhD Stroke Neurology 07/12/2018 12:29 PM    To contact Stroke Continuity provider, please refer to [email or web address] After hours, contact General Neurology

## 2018-07-12 NOTE — Progress Notes (Signed)
  Echocardiogram 2D Echocardiogram has been performed.  Darlina Sicilian M 07/12/2018, 9:05 AM

## 2018-07-12 NOTE — Evaluation (Signed)
Occupational Therapy Evaluation Patient Details Name: Julie Carpenter MRN: [id number] DOB: 06-21-1926 Today's Date: 07/12/2018    History of Present Illness Pt is a 84 y/o female admitted from her ALF secondary to confusion. MRI revealed an acute R MCA infarct. PMH including but not limited to a-fib, DM and HTN.   Clinical Impression   PT admitted with R MCA. Pt currently with functional limitiations due to the deficits listed below (see OT problem list). Pt currently requires max (A) for bed level care. Recommend hoyer lift for OOB. Recommend wound consult for wound on R buttock at this time. Pt needs frequent repositioning to prevent further skin breakdown. Possible need for airmattress overlay. Pt will benefit from skilled OT to increase their independence and safety with adls and balance to allow discharge SNF.  Recommending ortho to round on patient for L wrist-pending follow up with Dr Lorin Mercy ( see second note in chart with details)     Follow Up Recommendations  SNF    Equipment Recommendations  Wheelchair cushion (measurements OT);Wheelchair (measurements OT);Hospital bed;3 in 1 bedside commode    Recommendations for Other Services Other (comment)(wound care- buttock)     Precautions / Restrictions Precautions Precautions: Fall Precaution Comments: break down on L buttock noted Restrictions Weight Bearing Restrictions: Yes LUE Weight Bearing: Non weight bearing Other Position/Activity Restrictions: per family - pt with a recent L wrist fx ~2 weeks ago, assumed NWB      Mobility Bed Mobility Overal bed mobility: Needs Assistance Bed Mobility: Rolling Rolling: Max assist         General bed mobility comments: pt requires max (A) for knee fleixon bil LE and unable to sustain. pt with full body extension with log rolling. pt requires mod- max (A) to keep in side lying.   Transfers                 General transfer comment: bed used to help position in chair  position with L UE elevated    Balance Overall balance assessment: Needs assistance Sitting-balance support: Feet supported Sitting balance-Leahy Scale: Fair         Standing balance comment: recommend platform for L wrist if progressing with RW                           ADL either performed or assessed with clinical judgement   ADL Overall ADL's : Needs assistance/impaired Eating/Feeding: Moderate assistance   Grooming: Moderate assistance   Upper Body Bathing: Moderate assistance   Lower Body Bathing: Maximal assistance             Toilet Transfer Details (indicate cue type and reason): pt currently with purewick. pt with wet surface and reports (no) when asked if linen is wet. Pt requires total (A) for peri and new linen   Toileting - Clothing Manipulation Details (indicate cue type and reason): total - noted to have skin break down on buttock under dressing and also redness in peri area       General ADL Comments: pt noted to have extension with bed mobility and not attempted to transfer but instead bed positioned in chair position for patient safety .      Vision         Perception     Praxis      Pertinent Vitals/Pain Pain Assessment: No/denies pain     Hand Dominance Right   Extremity/Trunk Assessment Upper Extremity Assessment Upper Extremity Assessment:  LUE deficits/detail LUE Deficits / Details: wrist cock up splint applied. splint removed and UE washed / lotion applied. Pt with L UE elevated due to edema noted in forearm.    Lower Extremity Assessment Lower Extremity Assessment: Defer to PT evaluation   Cervical / Trunk Assessment Cervical / Trunk Assessment: Kyphotic   Communication Communication Communication: No difficulties   Cognition Arousal/Alertness: Awake/alert Behavior During Therapy: WFL for tasks assessed/performed Overall Cognitive Status: Impaired/Different from baseline Area of Impairment: Following  commands;Safety/judgement;Problem solving;Orientation                 Orientation Level: Disoriented to;Situation     Following Commands: Follows one step commands with increased time Safety/Judgement: Decreased awareness of deficits;Decreased awareness of safety   Problem Solving: Slow processing;Difficulty sequencing;Requires verbal cues General Comments: pt winking at therapist in response to question to show appreciation for hygiene   General Comments  recommend reposition frequently due to wound noted on R buttock under foam dressing. RN present and noting    Exercises Exercises: Other exercises Other Exercises Other Exercises: AAROM digits L hand Other Exercises: removal of splint for skin care Other Exercises: bil heels floated off bed surface for skin integrity   Shoulder Instructions      Home Living Family/patient expects to be discharged to:: Assisted living                             Home Equipment: Wheelchair - manual          Prior Functioning/Environment Level of Independence: Needs assistance  Gait / Transfers Assistance Needed: since her recent falls, pt has been using a w/c for mobility, requires assistance with transfers ADL's / Homemaking Assistance Needed: requires assistance            OT Problem List: Decreased strength;Decreased activity tolerance;Decreased range of motion;Impaired balance (sitting and/or standing);Impaired vision/perception;Decreased coordination;Decreased cognition;Decreased safety awareness;Decreased knowledge of use of DME or AE;Decreased knowledge of precautions;Cardiopulmonary status limiting activity;Impaired tone;Impaired sensation;Impaired UE functional use      OT Treatment/Interventions: Self-care/ADL training;Therapeutic exercise;Neuromuscular education;Energy conservation;DME and/or AE instruction;Manual therapy;Modalities;Therapeutic activities;Cognitive remediation/compensation;Visual/perceptual  remediation/compensation;Patient/family education;Balance training    OT Goals(Current goals can be found in the care plan section) Acute Rehab OT Goals Patient Stated Goal: to get better OT Goal Formulation: With patient/family Time For Goal Achievement: 07/26/18 Potential to Achieve Goals: Good  OT Frequency: Min 3X/week   Barriers to D/C:            Co-evaluation              AM-PAC PT "6 Clicks" Daily Activity     Outcome Measure Help from another person eating meals?: A Lot Help from another person taking care of personal grooming?: A Lot Help from another person toileting, which includes using toliet, bedpan, or urinal?: A Lot Help from another person bathing (including washing, rinsing, drying)?: A Lot Help from another person to put on and taking off regular upper body clothing?: A Lot Help from another person to put on and taking off regular lower body clothing?: Total 6 Click Score: 11   End of Session Equipment Utilized During Treatment: Other (comment)(purewick in place) Nurse Communication: Precautions;Weight bearing status;Need for lift equipment;Mobility status  Activity Tolerance: Patient tolerated treatment well Patient left: in bed;with call bell/phone within reach;with bed alarm set;with family/visitor present  OT Visit Diagnosis: Unsteadiness on feet (R26.81);Muscle weakness (generalized) (M62.81)  Time: 3009-2330 OT Time Calculation (min): 29 min Charges:  OT General Charges $OT Visit: 1 Visit OT Evaluation $OT Eval Moderate Complexity: 1 Mod OT Treatments $Self Care/Home Management : 8-22 mins   Jeri Modena, OTR/L  Acute Rehabilitation Services Pager: [phone] Office: [phone] .   Parke Poisson B 07/12/2018, 12:09 PM

## 2018-07-12 NOTE — [place] FL2 (Signed)
[place] MEDICAID FL2 LEVEL OF CARE SCREENING TOOL     IDENTIFICATION  Patient Name: Julie Carpenter Birthdate: Apr 04, 1926 Sex: female Admission Date (Current Location): 07/10/2018  [facility] and [place] Number:  Herbalist and Address:  [facility] [place], [place] [address], [place], [place] [address]      Provider Number: [id number]  Attending Physician Name and Address:  Bonnielee Haff, MD  Relative Name and Phone Number:       Current Level of Care: Hospital Recommended Level of Care: [place] Prior Approval Number:    Date Approved/Denied:   PASRR Number: [id number]  Discharge Plan: SNF    Current Diagnoses: Patient Active Problem List   Diagnosis Date Noted  . Hemorrhagic stroke ([place]) 07/10/2018  . Sepsis ([place]) 07/10/2018  . Aspiration pneumonia ([place]) 07/10/2018  . Type 2 diabetes mellitus ([place]) 07/10/2018  . Pressure injury of skin 07/10/2018  . Long term current use of anticoagulant therapy 12/06/2015  . Hypoglycemia 10/27/2015  . MDD (major depressive disorder), recurrent severe, without psychosis ([place]) 10/26/2015  . Dementia with behavioral disturbance ([place]) 10/26/2015  . CKD (chronic kidney disease) stage 4, GFR 15-29 ml/min (HCC)   . Suicidal overdose ([place]) 10/24/2015  . Depression 10/24/2015  . Other acute pulmonary embolism without acute cor pulmonale ([place]) 08/31/2015  . Long term (current) use of anticoagulants [Z79.01] 08/31/2015  . Pulmonary embolism ([place]) 08/23/2015  . Shortness of breath 08/22/2015  . Dyspnea 08/17/2015  . DOE (dyspnea on exertion) 08/17/2015  . GERD (gastroesophageal reflux disease) 02/23/2014  . Peripheral edema 02/23/2014  . GI bleed 04/05/2013  . Atrial fibrillation ([place]) 02/24/2013  . HTN (hypertension) 12/26/2010  . Chronic kidney disease (CKD) stage G3b/A2, moderately decreased glomerular filtration rate (GFR) between 30-44 mL/min/1.73 square meter and albuminuria creatinine  ratio between 30-299 mg/g (HCC) 12/26/2010  . Hyperlipemia 12/26/2010  . Peripheral neuropathy 12/26/2010  . Anemia 12/26/2010  . Diabetes ([place]) 12/26/2010  . Vertebral fracture 12/26/2010  . Venous insufficiency 12/26/2010  . Osteoporosis 12/26/2010  . Thyromegaly 12/26/2010    Orientation RESPIRATION BLADDER Height & Weight     (disoriented x4)  Normal Continent Weight: 167 lb 12.3 oz (76.1 kg) Height:     BEHAVIORAL SYMPTOMS/MOOD NEUROLOGICAL BOWEL NUTRITION STATUS      Continent Diet(NPO at this time. Diet subject to change please see summary at discharge)  [place] Skin   Extensive Assist Verbally PU Stage and Appropriate Care   PU Stage 2 Dressing: (Buttocks, foam dressing)                   Personal Care Assistance Level of Assistance  Dressing, Feeding, Bathing Bathing Assistance: Maximum assistance Feeding assistance: Limited assistance Dressing Assistance: Maximum assistance     Functional Limitations Info  Sight, Hearing, Speech Sight Info: Adequate Hearing Info: Adequate Speech Info: Adequate    SPECIAL CARE FACTORS FREQUENCY  PT (By licensed PT), OT (By licensed OT)     PT Frequency: 3x OT Frequency: 3x            Contractures Contractures Info: Not present    Additional Factors Info  Code Status, Allergies Code Status Info: DNR Allergies Info: Penicillins, Benicar Olmesartan Medoxomil, Ciprofloxacin, Hctz Hydrochlorothiazide, Januvia Sitagliptin Phosphate, Norvasc Amlodipine Besylate, Ramipril           Current Medications (07/12/2018):  This is the current hospital active medication list Current Facility-Administered Medications  Medication Dose Route Frequency Provider Last  Rate Last Dose  . acetaminophen (TYLENOL) tablet 650 mg  650 mg Oral Q4H PRN Shela Leff, MD       Or  . acetaminophen (TYLENOL) solution 650 mg  650 mg Per Tube Q4H PRN Shela Leff, MD       Or  . acetaminophen  (TYLENOL) suppository 650 mg  650 mg Rectal Q4H PRN Shela Leff, MD      . aspirin EC tablet 81 mg  81 mg Oral Daily Rosalin Hawking, MD       Or  . aspirin suppository 300 mg  300 mg Rectal Daily Rosalin Hawking, MD   300 mg at 07/11/18 1850  . aztreonam (AZACTAM) 1 g in sodium chloride 0.9 % 100 mL IVPB  1 g Intravenous Q8H Shela Leff, MD 200 mL/hr at 07/12/18 0328 1 g at 07/12/18 0328  . insulin aspart (novoLOG) injection 0-9 Units  0-9 Units Subcutaneous TID WC Shela Leff, MD   1 Units at 07/12/18 0856  . labetalol (NORMODYNE,TRANDATE) injection 5 mg  5 mg Intravenous Q10 min PRN Shela Leff, MD   5 mg at 07/12/18 0847  . metroNIDAZOLE (FLAGYL) IVPB 500 mg  500 mg Intravenous Quay [place], MD 100 mL/hr at 07/12/18 0805 500 mg at 07/12/18 0805  . [START ON 07/13/2018] vitamin B-12 (CYANOCOBALAMIN) tablet 500 mcg  500 mcg Oral Daily Bonnielee Haff, MD         Discharge Medications: Please see discharge summary for a list of discharge medications.  Relevant Imaging Results:  Relevant Lab Results:   Additional Information SSN: [id number]  Eileen Stanford, LCSW

## 2018-07-12 NOTE — Progress Notes (Signed)
Modified Barium Swallow Progress Note  Patient Details  Name: Julie Carpenter MRN: [id number] Date of Birth: 03-24-1926  Today's Date: 07/12/2018  Modified Barium Swallow completed.  Full report located under Chart Review in the Imaging Section.  Brief recommendations include the following:  Clinical Impression  Pt presents with mild oral and severe pharyngeal dysphagia.   Largest deficit is her delay in reflexive swallow and decreased laryngeal closure allowing aspiration/penetration without adequate sensation nor ability to clear.  Pt did cough significantly with gross nectar aspiration but cough did not clear.  Aspiration of thin, nectar, penetration of honey, puree noted without pt awareness if mild.  Pharyngeal swallow is overall strong with mild residuals but delayed as boluses pool in distal pharynx.  She is aspiration risk regardless of consistency due to her level of delay.  Strategies (chin tuck, cervical extension, cued dry swallow) did not prevent her from laryngeal penetration/aspiration.     If pt to consume diet it should be with accepted aspiration and ongoing aspiration pna risk.  TIGHT chin tuck posture with nectar via straw *SMALL boluses* and puree may be most comforting for pt but chin tuck posture is difficult for her to produce without hand over hand pressure.     Swallow Evaluation Recommendations  NPO OR DIET WITH ACCEPTED ASPIRATION RISKS WITH MITIGATION STRATEGIES     SLP Diet Recommendations: NPO;Nectar thick liquid;Dysphagia 1 (Puree) solids(or dys1/nectar with accepted risks for comfort)   Liquid Administration via: Straw   Medication Administration: Via alternative means(or crushed with pudding)   Supervision: Staff to assist with self feeding   Compensations: Small sips/bites;Slow rate;Follow solids with liquid;Chin tuck;Other (Comment)(if pt coughing, she is aspirating)   Postural Changes: Seated upright at 90 degrees;Remain semi-upright after after  feeds/meals (Comment)   Oral Care Recommendations: Oral care QID      Luanna Salk, MS [facility] SLP Acute Rehab Services Pager [phone] Office [phone]  Luanna Salk, [place] [facility] SLP Acute Rehab Services Pager [phone] Office [phone]  Macario Golds 07/12/2018,2:14 PM

## 2018-07-12 NOTE — Progress Notes (Addendum)
OT NOTE  Pt is pending follow up by Dr Lorin Mercy but unable to complete while admitted. Can ortho round on patient acutely for L wrist? MD please order ortho consult if agreeable      Dr Lorin Mercy [facility] [place], [place] [address] [phone])   Secure message sent to [place] On call MD found on AMION- (notifying practice of patient admission)   Jeri Modena, OTR/L  Acute Rehabilitation Services Pager: [phone] Office: [phone] .

## 2018-07-13 DIAGNOSIS — Z515 Encounter for palliative care: Secondary | ICD-10-CM

## 2018-07-13 DIAGNOSIS — R1312 Dysphagia, oropharyngeal phase: Secondary | ICD-10-CM

## 2018-07-13 LAB — BASIC METABOLIC PANEL
Anion gap: 5 (ref 5–15)
BUN: 22 mg/dL (ref 8–23)
CO2: 19 mmol/L — ABNORMAL LOW (ref 22–32)
CREATININE: 1.41 mg/dL — AB (ref 0.44–1.00)
Calcium: 9.1 mg/dL (ref 8.9–10.3)
Chloride: 117 mmol/L — ABNORMAL HIGH (ref 98–111)
GFR calc Af Amer: 37 mL/min — ABNORMAL LOW (ref >60)
GFR calc non Af Amer: 32 mL/min — ABNORMAL LOW (ref >60)
GLUCOSE: 139 mg/dL — AB (ref 70–99)
POTASSIUM: 3.7 mmol/L (ref 3.5–5.1)
SODIUM: 141 mmol/L (ref 135–145)

## 2018-07-13 LAB — CBC
HCT: 33.9 % — ABNORMAL LOW (ref 36.0–46.0)
Hemoglobin: 10.2 g/dL — ABNORMAL LOW (ref 12.0–15.0)
MCH: 26.6 pg (ref 26.0–34.0)
MCHC: 30.1 g/dL (ref 30.0–36.0)
MCV: 88.5 fL (ref 80.0–100.0)
PLATELETS: 226 10*3/uL (ref 150–400)
RBC: 3.83 MIL/uL — AB (ref 3.87–5.11)
RDW: 15.9 % — AB (ref 11.5–15.5)
WBC: 8.4 10*3/uL (ref 4.0–10.5)
nRBC: 0 % (ref 0.0–0.2)

## 2018-07-13 LAB — GLUCOSE, CAPILLARY
Glucose-Capillary: 139 mg/dL — ABNORMAL HIGH (ref 70–99)
Glucose-Capillary: 141 mg/dL — ABNORMAL HIGH (ref 70–99)
Glucose-Capillary: 143 mg/dL — ABNORMAL HIGH (ref 70–99)

## 2018-07-13 MED ORDER — SODIUM CHLORIDE 0.9 % IV SOLN
100.0000 mg | Freq: Two times a day (BID) | INTRAVENOUS | Status: DC
  Administered 2018-07-13 – 2018-07-14 (×3): 100 mg via INTRAVENOUS
  Filled 2018-07-13 (×3): qty 100

## 2018-07-13 NOTE — Progress Notes (Signed)
Initial Nutrition Assessment  DOCUMENTATION CODES:   Non-severe (moderate) malnutrition in context of chronic illness  INTERVENTION:   Diet to remain NPO.  If within goals of care, recommend initiating TF regimen as follows: Nepro @ 40 mL/hr, providing 1728 kcal, 78 g protein day, and 701 mL free water. Recommend additional 200 mL water flushes q 6 hours to meet hydration needs.  NUTRITION DIAGNOSIS:   Moderate Malnutrition related to dysphagia as evidenced by percent weight loss, mild fat depletion, mild muscle depletion.  GOAL:   Patient will meet greater than or equal to 90% of their needs   MONITOR:   Diet advancement, Labs, Skin  REASON FOR ASSESSMENT:   Malnutrition Screening Tool    ASSESSMENT:   84 yo female, admitted with hemorrhagic stroke. PMH significant for atrial fibrillation, CKD, T2DM, HTN, HLD. Family provided pt history. Lives in nursing home.  Labs: chloride 117, Creatinine 1.41, GFR 32/37, glucose 139, Hgb 10.2, Hct 33.9 Meds: novolog, vitamin B-12  Per SLP, pt to remain NPO or advance to Dysphagia I/nectar thick with accepted aspiration risks. Per MD, pt will remain NPO and comfort feeds may be pursued with family's consent. Palliative medicine may be consulted.  Pt eating ice chips with the help of her daughter at time of visit. Pt is hungry and describes her appetite as normal. Typically eats 3 meals/day. Nursing home limits vitamin K foods in her meals given Warfarin prescription. Pt does take MVI daily, but daughter is unsure what kind.   Daughter reports pt weighing 184# (83.6 kg) about 3 months ago --> 9% wt loss. Suspects swallowing issues may have played a role in this weight loss.   Pt denies nausea or vomiting, diarrhea or constipation. Daughter reports last BM the evening of 10/31.Pt has been wheelchair bound since her last fall, but is eager to ambulate independently.   Encouraged pt to eat protein-rich foods with every meal to promote  healing. Pt amenable to Magic Cup TID in any flavor as soon as diet advances.   NUTRITION - FOCUSED PHYSICAL EXAM:    Most Recent Value  Orbital Region  Mild depletion  Upper Arm Region  No depletion  Thoracic and Lumbar Region  No depletion  Buccal Region  Mild depletion  Temple Region  Moderate depletion  Clavicle Bone Region  Mild depletion  Clavicle and Acromion Bone Region  No depletion  Scapular Bone Region  Mild depletion  Dorsal Hand  Mild depletion  Patellar Region  No depletion  Anterior Thigh Region  No depletion  Posterior Calf Region  Mild depletion  Edema (RD Assessment)  None  Hair  Reviewed  Eyes  Reviewed  Mouth  Reviewed  Skin  Reviewed  Nails  Reviewed     Diet Order:   Diet Order            Diet NPO time specified  Diet effective now             EDUCATION NEEDS:  No education needs have been identified at this time  Skin:  Skin Assessment: Skin Integrity Issues: Skin Integrity Issues:: Stage II Stage II: buttocks  Last BM:  PTA  Height:  Ht Readings from Last 1 Encounters:  06/19/18 5\' 6"  (1.676 m)    Weight:  Wt Readings from Last 1 Encounters:  07/10/18 76.1 kg    Ideal Body Weight:  59.1 kg  BMI:  Body mass index is 27.08 kg/m.  Estimated Nutritional Needs:   Kcal:  1610 kcal/kday(MSJ  x 1.2)  Protein:  71-89 g protein/day(1.2-1.5 g/kg IBW)  Fluid:  1.5 L day or per MD discretion  Althea Grimmer, MS, RDN, LDN On-call pager: [phone]

## 2018-07-13 NOTE — Progress Notes (Signed)
[facility] PROGRESS NOTE  Julie Carpenter KZS:[id number] DOB: 09-24-1925 DOA: 07/10/2018  PCP: Vidal Schwalbe, MD  Brief History/Interval Summary:  84 y.o. female with medical history significant of atrial fibrillation on anticoagulation with warfarin, CKD, type 2 diabetes, hypertension, hyperlipidemia, prior VTE presented to [facility] from her nursing home via EMS for evaluation of tremors.    Patient was very somnolent so history was limited.  There was also concern for left-sided facial droop and left-sided weakness.  Patient also was noted to have a fever with a temperature of 101.2 F.  MRI was done and reviewed an acute infarct in the right MCA territory.  Hemorrhagic infarct was noted in the right frontal operculum and a nonhemorrhagic infarct in the right parietal operculum.  Patient was transferred to [facility] for further evaluation.    Reason for Visit: Acute stroke  Consultants: Neurology  Procedures:  Transthoracic echocardiogram Study Conclusions  - Left ventricle: The cavity size was normal. There was mild focal   basal hypertrophy of the septum. Systolic function was normal.   The estimated ejection fraction was in the range of 60% to 65%.   Wall motion was normal; there were no regional wall motion   abnormalities. - Mitral valve: Calcified annulus. There was moderate regurgitation   directed centrally. - Left atrium: The atrium was mildly dilated. - Tricuspid valve: There was moderate regurgitation. - Pulmonary arteries: Systolic pressure was mildly increased. PA   peak pressure: 45 mm Hg (S).  Carotid Dopplers Right Carotid: Velocities in the right ICA are consistent with a 1-39% stenosis. Left Carotid: Velocities in the left ICA are consistent with a 1-39% stenosis. Vertebrals: Bilateral vertebral arteries demonstrate antegrade flow. Subclavians: Normal flow hemodynamics were seen in bilateral subclavian  arteries.  Antibiotics: Vancomycin, aztreonam and metronidazole Vancomycin was discontinued on 11/3  Subjective/Interval History: Patient asleep this morning.  Easily arousable.  Denies any complaints.  No family at bedside today.  Patient states that her left side is stronger than yesterday.  ROS: Denies any headaches  Objective:  Vital Signs  Vitals:   07/13/18 0430 07/13/18 0454 07/13/18 0500 07/13/18 0706  BP:  (!) 174/94    Pulse: 92 98 86 91  Resp: 15 (!) 22 (!) 30 (!) 26  Temp:      TempSrc:      SpO2: 96% 98% 98% 99%  Weight:        Intake/Output Summary (Last 24 hours) at 07/13/2018 1119 Last data filed at 07/12/2018 1528 Gross per 24 hour  Intake 943.93 ml  Output -  Net 943.93 ml   Filed Weights   07/10/18 1326 07/10/18 2301  Weight: 74.4 kg 76.1 kg    General appearance: Asleep but easily arousable.  Following commands.  No distress. Resp: Normal effort at rest.  Diminished air entry at the bases.  Few crackles of the right.  But no wheezing or rhonchi. Cardio: S1-S2 is irregularly irregular.  No S3-S4.  No rubs or bruit.  Systolic murmur at the precordium. GI: Abdomen soft.  Nontender nondistended.  Bowel sounds are present and normal.  No masses organomegaly. Extremities: No edema noted bilateral lower extremities Neurologic: She remains awake alert.  She knew she was in the hospital.  She did not know she was in [place].  Cannot tell me the year correctly.  No obvious facial asymmetry noted.  Some dysarthria is present.  Tongue is midline.  Improved strength in the left upper and lower extremity  compared to yesterday.     Lab Results:  Data Reviewed: I have personally reviewed following labs and imaging studies  CBC: Recent Labs  Lab 07/10/18 1352 07/10/18 1403 07/12/18 0458 07/13/18 0630  WBC 11.2*  --  8.6 8.4  NEUTROABS 9.4*  --   --   --   HGB 10.9* 11.2* 9.8* 10.2*  HCT 34.6* 33.0* 32.6* 33.9*  MCV 86.1  --  89.1 88.5  PLT 340  --  280  810    Basic Metabolic Panel: Recent Labs  Lab 07/10/18 1352 07/10/18 1403 07/12/18 0458 07/13/18 0630  NA 135 137 140 141  K 4.0 4.0 3.4* 3.7  CL 103 102 115* 117*  CO2 23  --  21* 19*  GLUCOSE 279* 263* 153* 139*  BUN 24* 24* 24* 22  CREATININE 1.49* 1.50* 1.53* 1.41*  CALCIUM 9.4  --  9.0 9.1    GFR: Estimated Creatinine Clearance: 27.6 mL/min (A) (by C-G formula based on SCr of 1.41 mg/dL (H)).  Liver Function Tests: Recent Labs  Lab 07/10/18 1352  AST 25  ALT 15  ALKPHOS 139*  BILITOT 1.5*  PROT 7.7  ALBUMIN 3.4*    Recent Labs  Lab 07/10/18 1515  LIPASE 32   Coagulation Profile: Recent Labs  Lab 07/10/18 1352  INR 1.23    CBG: Recent Labs  Lab 07/12/18 0651 07/12/18 1144 07/12/18 1622 07/12/18 2137 07/13/18 0627  GLUCAP 137* 208* 179* 137* 139*     Recent Results (from the past 240 hour(s))  Culture, blood (routine x 2)     Status: None (Preliminary result)   Collection Time: 07/10/18  3:15 PM  Result Value Ref Range Status   Specimen Description BLOOD LEFT ANTECUBITAL  Final   Special Requests   Final    BOTTLES DRAWN AEROBIC AND ANAEROBIC Blood Culture adequate volume   Culture   Final    NO GROWTH 3 DAYS Performed at [facility], [address], [place], [place] [address]    Report Status PENDING  Incomplete  Culture, blood (routine x 2)     Status: None (Preliminary result)   Collection Time: 07/10/18  4:11 PM  Result Value Ref Range Status   Specimen Description BLOOD RIGHT WRIST  Final   Special Requests   Final    BOTTLES DRAWN AEROBIC ONLY Blood Culture results may not be optimal due to an inadequate volume of blood received in culture bottles   Culture   Final    NO GROWTH 3 DAYS Performed at [facility], [address], [place], [place] [address]    Report Status PENDING  Incomplete      Radiology Studies: Mr Virgel Paling DP Contrast  Result Date: 07/11/2018 CLINICAL DATA:  Right MCA stroke EXAM: MRA HEAD WITHOUT  CONTRAST TECHNIQUE: Angiographic images of the Circle of Willis were obtained using MRA technique without intravenous contrast. COMPARISON:  MRI head 07/10/2018 FINDINGS: Internal carotid artery widely patent bilaterally. Anterior and middle cerebral arteries patent bilaterally without stenosis or occlusion Right vertebral artery dominant and widely patent. Small left vertebral artery patent without stenosis PICA patent bilaterally. Basilar widely patent. Superior cerebellar and posterior cerebral arteries patent bilaterally. Mild stenosis left posterior cerebral artery. IMPRESSION: In this patient with acute right MCA infarct, the right middle cerebral arteries widely patent Mild stenosis left posterior cerebral artery. Electronically Signed   By: Franchot Gallo M.D.   On: 07/11/2018 15:19   Dg Swallowing Func-speech Pathology  Result Date: 07/12/2018 Objective  Swallowing Evaluation: Type of Study: MBS-Modified Barium Swallow Study  Patient Details Name: DEBBORAH ALONGE MRN: [id number] Date of Birth: [birth year] Today's Date: 07/12/2018 Time: SLP Start Time (ACUTE ONLY): 0930 -SLP Stop Time (ACUTE ONLY): 1006 SLP Time Calculation (min) (ACUTE ONLY): 36 min Past Medical History: Past Medical History: Diagnosis Date . Anemia  . Atrial fibrillation ([place])  . Chronic kidney disease  . Diabetes mellitus without complication ([place])  . DVT (deep venous thrombosis) ([place])  . Hyperlipidemia  . Hypertension  . PE (pulmonary embolism)  Past Surgical History: Past Surgical History: Procedure Laterality Date . ABDOMINAL HYSTERECTOMY   . bladder tack   . CHOLECYSTECTOMY   . COLONOSCOPY N/A 05/03/2013  GEZ:MOQHUT MOST LIKELY DUE TO ONE AVM in the ascending colon/Moderate diverticulosis hroughout the entire examined colon/Two POLYPS REMOVED/Small internal hemorrhoids . ESOPHAGOGASTRODUODENOSCOPY N/A 04/06/2013  Procedure: ESOPHAGOGASTRODUODENOSCOPY (EGD);  Surgeon: Danie Binder, MD;  Location: [facility];  Service: Endoscopy;   Laterality: N/A;  8:45 . GIVENS CAPSULE STUDY  04/06/2013  Procedure: GIVENS CAPSULE STUDY;  Surgeon: Danie Binder, MD;  Location: [facility];  Service: Endoscopy;; HPI: pt is a 84 yo adm to [facility] - transferred from [facility].  Pt with recent fall with left wrist fx, found to have a CVA with concern for aspiraton pna.  Swallow evaluation ordered.  Pt also with shingles.  CXR concerning for pna.  Pt has been lethargic over the last few days.  .   Subjective: pt awake in chair Assessment / Plan / Recommendation CHL IP CLINICAL IMPRESSIONS 07/12/2018 Clinical Impression Pt presents with mild oral and severe pharyngeal dysphagia.   Largest deficit is her delay in reflexive swallow and decreased laryngeal closure allowing aspiration/penetration without adequate sensation nor ability to clear.  Pt did cough significantly with gross nectar aspiration but cough did not clear.  Aspiration of thin, nectar, penetration of honey, puree noted without pt awareness if mild.  Pharyngeal swallow is overall strong with mild residuals but delayed as boluses pool in distal pharynx.  She is aspiration risk regardless of consistency due to her level of delay.  Strategies (chin tuck, cervical extension, cued dry swallow) did not prevent her from laryngeal penetration/aspiration.  If pt to consume diet it should be with accepted aspiration and ongoing aspiration pna risk.  Chin tuck posture with nectar via straw *SMALL boluses* and puree may be most comforting for pt.   SLP Visit Diagnosis Dysphagia, oropharyngeal phase (R13.12) Attention and concentration deficit following -- Frontal lobe and executive function deficit following -- Impact on safety and function Severe aspiration risk;Risk for inadequate nutrition/hydration   CHL IP TREATMENT RECOMMENDATION 07/12/2018 Treatment Recommendations Therapy as outlined in treatment plan below   Prognosis 07/12/2018 Prognosis for Safe Diet Advancement Fair Barriers to Reach Goals Severity of  deficits Barriers/Prognosis Comment -- CHL IP DIET RECOMMENDATION 07/12/2018 SLP Diet Recommendations NPO;Nectar thick liquid;Dysphagia 1 (Puree) solids Liquid Administration via Straw Medication Administration Via alternative means Compensations Small sips/bites;Slow rate;Follow solids with liquid;Chin tuck;Other (Comment) Postural Changes Seated upright at 90 degrees;Remain semi-upright after after feeds/meals (Comment)   CHL IP OTHER RECOMMENDATIONS 07/12/2018 Recommended Consults -- Oral Care Recommendations Oral care QID Other Recommendations --   CHL IP FOLLOW UP RECOMMENDATIONS 07/12/2018 Follow up Recommendations Other (comment)   CHL IP FREQUENCY AND DURATION 07/12/2018 Speech Therapy Frequency (ACUTE ONLY) min 1 x/week Treatment Duration 2 weeks      CHL IP ORAL PHASE 07/12/2018 Oral Phase Impaired Oral - Pudding Teaspoon --  Oral - Pudding Cup -- Oral - Honey Teaspoon Reduced posterior propulsion;Decreased bolus cohesion;Weak lingual manipulation Oral - Honey Cup -- Oral - Nectar Teaspoon Reduced posterior propulsion;Decreased bolus cohesion;Weak lingual manipulation Oral - Nectar Cup Reduced posterior propulsion;Decreased bolus cohesion;Weak lingual manipulation Oral - Nectar Straw Reduced posterior propulsion;Decreased bolus cohesion;Weak lingual manipulation Oral - Thin Teaspoon -- Oral - Thin Cup Reduced posterior propulsion;Decreased bolus cohesion;Weak lingual manipulation Oral - Thin Straw Reduced posterior propulsion;Decreased bolus cohesion;Weak lingual manipulation Oral - Puree Reduced posterior propulsion;Decreased bolus cohesion;Weak lingual manipulation Oral - Mech Soft Reduced posterior propulsion;Decreased bolus cohesion;Weak lingual manipulation;Impaired mastication Oral - Regular -- Oral - Multi-Consistency -- Oral - Pill -- Oral Phase - Comment --  CHL IP PHARYNGEAL PHASE 07/12/2018 Pharyngeal Phase Impaired Pharyngeal- Pudding Teaspoon -- Pharyngeal -- Pharyngeal- Pudding Cup -- Pharyngeal --  Pharyngeal- Honey Teaspoon Delayed swallow initiation-vallecula;Reduced pharyngeal peristalsis;Reduced epiglottic inversion;Penetration/Aspiration before swallow;Reduced laryngeal elevation;Reduced airway/laryngeal closure;Delayed swallow initiation-pyriform sinuses Pharyngeal Material enters airway, remains ABOVE vocal cords and not ejected out;Material enters airway, passes BELOW cords and not ejected out despite cough attempt by patient Pharyngeal- Honey Cup -- Pharyngeal -- Pharyngeal- Nectar Teaspoon Delayed swallow initiation-pyriform sinuses;Reduced pharyngeal peristalsis;Reduced epiglottic inversion;Reduced laryngeal elevation;Reduced airway/laryngeal closure;Penetration/Aspiration before swallow;Pharyngeal residue - valleculae Pharyngeal -- Pharyngeal- Nectar Cup Delayed swallow initiation-pyriform sinuses;Reduced pharyngeal peristalsis;Penetration/Aspiration before swallow;Reduced airway/laryngeal closure;Reduced laryngeal elevation;Reduced tongue base retraction;Moderate aspiration Pharyngeal Material enters airway, CONTACTS cords and not ejected out;Material enters airway, passes BELOW cords and not ejected out despite cough attempt by patient Pharyngeal- Nectar Straw Delayed swallow initiation-pyriform sinuses;Reduced tongue base retraction;Penetration/Aspiration during swallow;Penetration/Aspiration before swallow;Reduced pharyngeal peristalsis;Reduced laryngeal elevation;Reduced epiglottic inversion;Reduced airway/laryngeal closure Pharyngeal Material enters airway, CONTACTS cords and then ejected out Pharyngeal- Thin Teaspoon Reduced tongue base retraction;Reduced airway/laryngeal closure;Reduced anterior laryngeal mobility;Reduced laryngeal elevation;Delayed swallow initiation-vallecula Pharyngeal Material enters airway, passes BELOW cords without attempt by patient to eject out (silent aspiration) Pharyngeal- Thin Cup Delayed swallow initiation-pyriform sinuses;Reduced epiglottic inversion;Reduced  laryngeal elevation;Reduced airway/laryngeal closure;Reduced pharyngeal peristalsis;Penetration/Aspiration before swallow;Moderate aspiration Pharyngeal Material enters airway, passes BELOW cords without attempt by patient to eject out (silent aspiration) Pharyngeal- Thin Straw Delayed swallow initiation-pyriform sinuses;Reduced pharyngeal peristalsis;Reduced airway/laryngeal closure;Reduced laryngeal elevation Pharyngeal -- Pharyngeal- Puree Delayed swallow initiation-pyriform sinuses;Reduced pharyngeal peristalsis;Pharyngeal residue - valleculae;Reduced airway/laryngeal closure;Reduced tongue base retraction Pharyngeal -- Pharyngeal- Mechanical Soft Delayed swallow initiation-pyriform sinuses;Reduced pharyngeal peristalsis;Reduced laryngeal elevation;Reduced airway/laryngeal closure;Reduced epiglottic inversion;Reduced tongue base retraction Pharyngeal -- Pharyngeal- Regular -- Pharyngeal -- Pharyngeal- Multi-consistency -- Pharyngeal -- Pharyngeal- Pill -- Pharyngeal -- Pharyngeal Comment --  CHL IP CERVICAL ESOPHAGEAL PHASE 07/12/2018 Cervical Esophageal Phase Impaired Pudding Teaspoon -- Pudding Cup -- Honey Teaspoon -- Honey Cup -- Nectar Teaspoon -- Nectar Cup -- Nectar Straw -- Thin Teaspoon -- Thin Cup -- Thin Straw -- Puree -- Mechanical Soft -- Regular -- Multi-consistency -- Pill -- Cervical Esophageal Comment appearance of prominent cp Luanna Salk, MS [facility] SLP Acute Rehab Services Pager [phone] Office [phone] Macario Golds 07/12/2018, 2:13 PM              Vas US Carotid  Result Date: 07/13/2018 Carotid Arterial Duplex Study Indications:       CVA, Speech disturbance and facial droop, gait disturbance. Risk Factors:      Hypertension, hyperlipidemia, Diabetes. Other Factors:     Atrial fibrillation. Comparison Study:  No prior study on file Performing Technologist: Sharion Dove RVS  Examination Guidelines: A complete evaluation includes B-mode imaging, spectral Doppler, color  Doppler, and power Doppler as needed of all accessible portions of each  vessel. Bilateral testing is considered an integral part of a complete examination. Limited examinations for reoccurring indications may be performed as noted.  Right Carotid Findings: +----------+--------+--------+--------+------------+------------------+           PSV cm/sEDV cm/sStenosisDescribe    Comments           +----------+--------+--------+--------+------------+------------------+ CCA Prox  71      8                           intimal thickening +----------+--------+--------+--------+------------+------------------+ CCA Distal57      12                          intimal thickening +----------+--------+--------+--------+------------+------------------+ ICA Prox  63      12              heterogenous                   +----------+--------+--------+--------+------------+------------------+ ICA Distal77      15                                             +----------+--------+--------+--------+------------+------------------+ ECA       66      4                                              +----------+--------+--------+--------+------------+------------------+ +----------+--------+-------+--------+-------------------+           PSV cm/sEDV cmsDescribeArm Pressure (mmHG) +----------+--------+-------+--------+-------------------+ [id number]                                        +----------+--------+-------+--------+-------------------+ +---------+--------+--+--------+-+ VertebralPSV cm/s42EDV cm/s9 +---------+--------+--+--------+-+  Left Carotid Findings: +----------+--------+--------+--------+------------+------------------+           PSV cm/sEDV cm/sStenosisDescribe    Comments           +----------+--------+--------+--------+------------+------------------+ CCA Prox  72      7                           intimal thickening  +----------+--------+--------+--------+------------+------------------+ CCA Distal64      9                           intimal thickening +----------+--------+--------+--------+------------+------------------+ ICA Prox  50      13              heterogenousShadowing          +----------+--------+--------+--------+------------+------------------+ ICA Distal66      17                                             +----------+--------+--------+--------+------------+------------------+ ECA       66      2                                              +----------+--------+--------+--------+------------+------------------+ +---------+--------+--+--------+-+  VertebralPSV cm/s44EDV cm/s7 +---------+--------+--+--------+-+  Summary: Right Carotid: Velocities in the right ICA are consistent with a 1-39% stenosis. Left Carotid: Velocities in the left ICA are consistent with a 1-39% stenosis. Vertebrals:  Bilateral vertebral arteries demonstrate antegrade flow. Subclavians: Normal flow hemodynamics were seen in bilateral subclavian              arteries. *See table(s) above for measurements and observations.  Electronically signed by Antony Contras MD on 07/13/2018 at 8:17:16 AM.    Final      Medications:  Scheduled: . aspirin EC  81 mg Oral Daily   Or  . aspirin  300 mg Rectal Daily  . insulin aspart  0-9 Units Subcutaneous TID WC  . vitamin B-12  500 mcg Oral Daily   Continuous: . aztreonam 1 g (07/13/18 0746)  . metronidazole 500 mg (07/13/18 0640)   CZY:[id number] **OR** acetaminophen (TYLENOL) oral liquid 160 mg/5 mL **OR** acetaminophen, labetalol   Assessment/Plan:  Acute stroke Multiple infarcts seen on MRI. One of the infarcts has an hemorrhagic component.  Patient transferred to [facility] for higher level of care.  Patient was seen by neurology.  Echocardiogram and carotid Doppler as above.  LDL 123.  She will need a statin at discharge.  HbA1c 7.9.   Skilled nursing facility is recommended by therapy.  Patient is on aspirin.  Her warfarin was held due to the component of hemorrhage.  Neurology recommends starting anticoagulation on 11/7.  They recommend Eliquis which they have discussed with the patient and family.   Oropharyngeal dysphagia This is secondary to her stroke.  Apparently patient is not safe for any level of diet according to the speech therapist.  If at all comfort feeds will have to be pursued.  No family at bedside this morning to discuss.  It may be worthwhile having the patient seen by palliative medicine to have a goals of care conversation.  Leave n.p.o. for now.  Acute metabolic encephalopathy Likely multifactorial including stroke, fever and medications.  Apparently she was given fentanyl at the outside facility.  Mentation significantly improved and could be close to her baseline.  Avoid narcotics.  Sepsis secondary to aspiration pneumonia CT scan of the abdomen and pelvis was done as the patient was noted to be tender.  No acute findings noted.  UA does not suggest UTI.  Chest x-ray did suggest aspiration.  Patient was started on broad-spectrum coverage with vancomycin and aztreonam and metronidazole.  She is noted to be allergic to penicillin.  Respiratory status is stable.  Blood cultures negative.  Lactic acid level was 1.9.  Procalcitonin was less than 0.1.  Lipase level was normal.  Vancomycin was discontinued.  Change aztreonam to doxycycline.  Leave her on metronidazole.  History of chronic atrial fibrillation Rate is controlled.  Continue to monitor on telemetry.  Patient was on carvedilol at home which is held due to risk for aspiration.  Use IV beta-blocker as needed.  Chads 2 vascular score is 7.  She was on warfarin which is being held due to the hemorrhagic component noted on MRI brain.  As discussed above neurology recommends Eliquis starting 11/7.  They have discussed this with patient and family.  TSH is  normal.  Chronic kidney disease stage IV/hypokalemia Renal function is close to baseline.  Calcium is normal today.  Normal anion gap metabolic acidosis is noted.  Continue to monitor.  Diabetes mellitus type 2 with renal complications Continue to monitor CBGs.  HbA1c is  7.9.  SSI.  Essential hypertension Using IV labetalol as needed for blood pressure control with parameters as outlined by neurology.  Initiate oral medications once she is on a diet.  Hyperlipidemia LDL is 123.  No statin noted on her home medication list.  This will need to be initiated once she is able to take orally.  Normocytic anemia/vitamin B12 deficiency Drop in hemoglobin is likely dilutional.  No evidence for overt bleeding.  Hemoglobin is stable.  Low normal vitamin B12 level noted.  This will be supplemented.   Sacral decubitus stage II Wound care.  DVT Prophylaxis: SCDs    Code Status: DNR Family Communication: No family at bedside today Disposition Plan: Management as outlined above.  Palliative medicine consulted.    LOS: 3 days   [facility] Pager [phone] 07/13/2018, 11:19 AM  If 7PM-7AM, please contact night-coverage at [email or web address], password [facility]

## 2018-07-13 NOTE — Consult Note (Signed)
Consultation Note Date: 07/13/2018   Patient Name: Julie Carpenter  DOB: 10-20-1925  MRN: [id number]  Age / Sex: 84 y.o., female  PCP: Julie Schwalbe, MD Referring Physician: Bonnielee Haff, MD  Reason for Consultation: Establishing goals of care and Psychosocial/spiritual support  HPI/Patient Profile: 84 y.o. female  with past medical history of PE, DVT, CKD IV, Afib who was admitted on 07/10/2018 with multiple strokes.  Hospital work up has revealed aspiration with any level of diet.  Echo shows mitral and tricuspid regurg.    Clinical Assessment and Goals of Care:  I have reviewed medical records including EPIC notes, labs and imaging, received report from Dr. [place] Carpenter, assessed the patient and then met at the bedside along with her two daughters Julie Carpenter and Julie Carpenter)  to discuss diagnosis prognosis, [place], EOL wishes, disposition and options.  I introduced Palliative Medicine as specialized medical care for people living with serious illness. It focuses on providing relief from the symptoms and stress of a serious illness. The goal is to improve quality of life for both the patient and the family.  We discussed a brief life review of the patient. 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  She loved two stepping and went dancing several nights a week. She and her daughters also enjoyed Company secretary.  Approximately 1 month ago Julie Carpenter had a fall.  Since she has declined significantly and been unable to walk.  As far as functional and nutritional status at this point she is not walking or able to eat safely.  She is awake and recognizes her family.  Her speech is impaired from the stroke.   We discussed their current illness and what it means in the larger context of their on-going co-morbidities.  Natural disease trajectory and expectations at EOL were  discussed.  Specifically we talked about aspiration pneumonia being a terminal illness.  I explained that she will aspirate on her own secretions a feeding tube will not cure aspiration.  I attempted to elicit values and goals of care important to the patient.  The patient prefers to be comfortable and does not want a feeding tube.  Hospice and Palliative Care services outpatient were explained and offered.  Questions and concerns were addressed.  Hard Choices booklet left for review. The family was encouraged to call with questions or concerns.        Primary Decision Maker:  NEXT OF KIN Daughter Julie Carpenter is the Media planner.  Julie Carpenter defers to [employer].    SUMMARY OF RECOMMENDATIONS    Julie Carpenter would like to speak with her mother's hospice RN about feeding tubes.  She would also like to talk with the patient's primary care physician before making a decision about a feeding tube.  I will touch base again with the family tomorrow (11/5).  Code Status/Advance Care Planning: DNR  Symptom Management:   Per primary team  Palliative Prophylaxis:   Aspiration  Psycho-social/Spiritual:   Desire for further Chaplaincy  support: yes  Prognosis:  To be determined.  Likely months if feeding tube is placed.      Discharge Planning: To Be Determined      Primary Diagnoses: Present on Admission: . Atrial fibrillation ([place]) . HTN (hypertension) . Hyperlipemia . Anemia   I have reviewed the medical record, interviewed the patient and family, and examined the patient. The following aspects are pertinent.  Past Medical History:  Diagnosis Date  . Anemia   . Atrial fibrillation ([place])   . Chronic kidney disease   . Diabetes mellitus without complication ([place])   . DVT (deep venous thrombosis) ([place])   . Hyperlipidemia   . Hypertension   . PE (pulmonary embolism)    Social History   Socioeconomic History  . Marital status: Widowed    Spouse name: Not on file  . Number of  children: Not on file  . Years of education: Not on file  . Highest education level: Not on file  Occupational History  . Not on file  Social Needs  . Financial resource strain: Not on file  . Food insecurity:    Worry: Not on file    Inability: Not on file  . Transportation needs:    Medical: Not on file    Non-medical: Not on file  Tobacco Use  . Smoking status: Never Smoker  . Smokeless tobacco: Never Used  Substance and Sexual Activity  . Alcohol use: No  . Drug use: No  . Sexual activity: Not on file  Lifestyle  . Physical activity:    Days per week: Not on file    Minutes per session: Not on file  . Stress: Not on file  Relationships  . Social connections:    Talks on phone: Not on file    Gets together: Not on file    Attends religious service: Not on file    Active member of club or organization: Not on file    Attends meetings of clubs or organizations: Not on file    Relationship status: Not on file  Other Topics Concern  . Not on file  Social History Narrative  . Not on file   Family History  Problem Relation Age of Onset  . Colon cancer Neg Hx    Scheduled Meds: . aspirin EC  81 mg Oral Daily   Or  . aspirin  300 mg Rectal Daily  . insulin aspart  0-9 Units Subcutaneous TID WC  . vitamin B-12  500 mcg Oral Daily   Continuous Infusions: . doxycycline (VIBRAMYCIN) IV    . metronidazole 500 mg (07/13/18 0640)   PRN Meds:.acetaminophen **OR** acetaminophen (TYLENOL) oral liquid 160 mg/5 mL **OR** acetaminophen, labetalol Allergies  Allergen Reactions  . Penicillins Anaphylaxis    Has patient had a PCN reaction causing immediate rash, facial/tongue/throat swelling, SOB or lightheadedness with hypotension: Yes Has patient had a PCN reaction causing severe rash involving mucus membranes or skin necrosis: No Has patient had a PCN reaction that required hospitalization No Has patient had a PCN reaction occurring within the last 10 years: No If all of  the above answers are "NO", then may proceed with Cephalosporin use.  Marland Kitchen Benicar [Olmesartan Medoxomil] Swelling    Lips swelling  . Ciprofloxacin Swelling  . Hctz [Hydrochlorothiazide] Swelling    Lips swelling  . Januvia [Sitagliptin Phosphate] Swelling    Lips swelling  . Norvasc [Amlodipine Besylate] Swelling    Lips swelling  . Ramipril Swelling    Lips  swelling   Review of Systems Patient is currently comfortable, denies pain.  She is unable to give a thorough ROS  Physical Exam  Well developed elderly female, lying in bed, pleasant, NAD CV irreg Resp no distress Abdomen soft, nt ,nd  Vital Signs: BP (!) 169/90 (BP Location: Left Arm)   Pulse (!) 102   Temp 98.8 F (37.1 C) (Axillary)   Resp 14   Wt 76.1 kg   SpO2 98%   BMI 27.08 kg/m  Pain Scale: 0-10   Pain Score: 0-No pain   SpO2: SpO2: 98 % O2 Device:SpO2: 98 % O2 Flow Rate: .   IO: Intake/output summary:   Intake/Output Summary (Last 24 hours) at 07/13/2018 1503 Last data filed at 07/12/2018 1528 Gross per 24 hour  Intake 0 ml  Output -  Net 0 ml    LBM: Last BM Date: 07/10/18 Baseline Weight: Weight: 74.4 kg Most recent weight: Weight: 76.1 kg     Palliative Assessment/Data: 20%     Time In: 2:30 Time Out: 3:30 Time Total: 60 min. Greater than 50%  of this time was spent counseling and coordinating care related to the above assessment and plan.  Signed by: Florentina Jenny, PA-C Palliative Medicine Pager: [phone]  Please contact Palliative Medicine Team phone at [phone] for questions and concerns.  For individual provider: See Shea Evans

## 2018-07-13 NOTE — Progress Notes (Signed)
CSW met with patient and daughter at bedside to discuss SNF options. Patient's daughter said she reviewed facility options and would prefer [facility], [facility], or [facility]. CSW sent out referral. [facility] is full, unable to take patient. [facility] has offered a bed.   CSW to follow for discharge to SNF when medically stable. Patient currently NPO, unable to be cleared for a diet at this time. Palliative care also consulted.  Laveda Abbe, [place] Clinical Social Worker [phone]

## 2018-07-13 NOTE — Consult Note (Signed)
[facility] Nurse wound consult note Reason for Consult: Consult requested for buttocks.  Pt is frequently incontinent of urine and liquid stool and it is difficult to keep the affected areas from becoming soiled. Wound type: Patchy areas of partial thickness skin loss to bilat buttocks; appearance is consistent with moisture associated skin damage.  Left buttock with stage 2 pressure injury Pressure Injury POA: Yes Measurement: 2X1X.1cm Wound bed: pink and moist Drainage (amount, consistency, odor) small amt tan drainage, no odor Dressing procedure/placement/frequency: Dressings would become soiled; apply barrier cream with each turning and cleaning episode to protect skin and repel moisture. No family present to discuss plan of care. Please re-consult if further assistance is needed.  Thank-you,  Julien Girt MSN, [place], [place], [place], [place]

## 2018-07-13 NOTE — Progress Notes (Signed)
SLP Cancellation Note  Patient Details Name: Julie Carpenter MRN: [id number] DOB: 1926-02-19   Cancelled treatment:        Cancelled. Patient is lethargic, unable to wake fully.   Charlynne Cousins Raahim Shartzer, MA, CCC-SLP 07/13/2018 2:42 PM

## 2018-07-13 NOTE — Progress Notes (Signed)
Physical Therapy Treatment Patient Details Name: Julie Carpenter MRN: [id number] DOB: Oct 29, 1925 Today's Date: 07/13/2018    History of Present Illness Pt is a 84 y/o female admitted from her ALF secondary to confusion. MRI revealed an acute R MCA infarct. PMH including but not limited to a-fib, DM and HTN.    PT Comments    Pt tolerated today's treatment well. IV nursing was present during this session for 15 min non billable time. During bed mobility therapist noticed pt had been laying in urinary incontinence. Pt required Max assist for rolling, needing VC for hand placement. To power up into sitting pt required assist progressing LE off EOB. Pt performed sit to stand from EOB with Mod A +2.With pt standing at EOB, therapist performed pericare, then pt had urinary incontinence and was then transferred to [facility]. Upon standing pt had urinary incontinence again. Pt ambulated 3-4 steps with RW  to transfer to chair. Nursing was notified to place purewick at end of session. Pt continues to progress toward stated goals and would benefit from cont's skilled PT in order to increase functional mobility. Plan remains appropriate for SNF based on current function.     Follow Up Recommendations  SNF     Equipment Recommendations  None recommended by PT    Recommendations for Other Services       Precautions / Restrictions Precautions Precautions: Fall Precaution Comments: break down on L buttock noted Restrictions Weight Bearing Restrictions: Yes LUE Weight Bearing: Non weight bearing Other Position/Activity Restrictions: per family - pt with a recent L wrist fx ~2 weeks ago, assumed NWB    Mobility  Bed Mobility Overal bed mobility: Needs Assistance Bed Mobility: Rolling Rolling: Max assist   Supine to sit: Mod assist;+2 for physical assistance     General bed mobility comments: pt requires mod (A) to keep in side lying, requires assist with progressing LE off EOB. Use of bed pad to  position hips when on EOB   Transfers Overall transfer level: Needs assistance Equipment used: 2 person hand held assist;Rolling walker (2 wheeled)(second attempt with RW) Transfers: Sit to/from [facility] Sit to Stand: Mod assist;+2 physical assistance Stand pivot transfers: Mod assist;+2 physical assistance       General transfer comment:  stand pivot to [facility] with + 2 handheld then stand pivot with RW to chair. Pt required VC/TC for safety and upright posture. Pt able to stand with Mod A + 2 while therapist performed pericare.  Ambulation/Gait Ambulation/Gait assistance: Mod assist;+2 physical assistance;+2 safety/equipment Gait Distance (Feet): (3-4 steps to chiar) Assistive device: Rolling walker (2 wheeled)       General Gait Details: Pt 3-4 steps with RW from [facility] to chair requiring Mod A +2 for safety and to prevent LOB.   Stairs             Wheelchair Mobility    Modified Rankin (Stroke Patients Only) Modified Rankin (Stroke Patients Only) Pre-Morbid Rankin Score: Moderately severe disability Modified Rankin: Severe disability     Balance Overall balance assessment: Needs assistance Sitting-balance support: Feet supported;Single extremity supported Sitting balance-Leahy Scale: Fair Sitting balance - Comments: Pt able to sit on EOB with Min guard for periods.  Postural control: Left lateral lean Standing balance support: Bilateral upper extremity supported Standing balance-Leahy Scale: Poor Standing balance comment: Pt needed assist to hold L UE on RW.  Cognition Arousal/Alertness: Awake/alert Behavior During Therapy: WFL for tasks assessed/performed Overall Cognitive Status: Impaired/Different from baseline Area of Impairment: Following commands;Safety/judgement;Problem solving;Orientation                 Orientation Level: Disoriented to;Situation     Following Commands: Follows one step  commands with increased time Safety/Judgement: Decreased awareness of deficits;Decreased awareness of safety   Problem Solving: Slow processing;Difficulty sequencing;Requires verbal cues        Exercises Total Joint Exercises Ankle Circles/Pumps: AROM;15 reps;Right;Left;Supine Short Arc Quad: AROM;10 reps;Right;Left;Supine Heel Slides: AROM;10 reps;Right;Left;Supine    General Comments        Pertinent Vitals/Pain Pain Assessment: No/denies pain    Home Living                      Prior Function            PT Goals (current goals can now be found in the care plan section) Acute Rehab PT Goals Patient Stated Goal: to get better PT Goal Formulation: With patient/family Time For Goal Achievement: 07/25/18 Potential to Achieve Goals: Good Progress towards PT goals: Progressing toward goals    Frequency    Min 3X/week      PT Plan Current plan remains appropriate    Co-evaluation              AM-PAC PT "6 Clicks" Daily Activity  Outcome Measure  Difficulty turning over in bed (including adjusting bedclothes, sheets and blankets)?: A Lot Difficulty moving from lying on back to sitting on the side of the bed? : A Lot Difficulty sitting down on and standing up from a chair with arms (e.g., wheelchair, bedside commode, etc,.)?: A Lot Help needed moving to and from a bed to chair (including a wheelchair)?: A Lot Help needed walking in hospital room?: A Lot Help needed climbing 3-5 steps with a railing? : Total 6 Click Score: 11    End of Session Equipment Utilized During Treatment: Gait belt Activity Tolerance: Patient tolerated treatment well Patient left: in chair;with call bell/phone within reach;with chair alarm set Nurse Communication: Mobility status PT Visit Diagnosis: Other abnormalities of gait and mobility (R26.89)     Time: 1512-1600 PT Time Calculation (min) (ACUTE ONLY): 48 min  Charges:  $Therapeutic Activity: 23-37 mins                      [address], SPTA   [place] 07/13/2018, 5:23 PM

## 2018-07-14 ENCOUNTER — Telehealth (INDEPENDENT_AMBULATORY_CARE_PROVIDER_SITE_OTHER): Payer: Self-pay | Admitting: Orthopaedic Surgery

## 2018-07-14 DIAGNOSIS — E440 Moderate protein-calorie malnutrition: Secondary | ICD-10-CM

## 2018-07-14 DIAGNOSIS — I639 Cerebral infarction, unspecified: Secondary | ICD-10-CM

## 2018-07-14 LAB — GLUCOSE, CAPILLARY
GLUCOSE-CAPILLARY: 218 mg/dL — AB (ref 70–99)
Glucose-Capillary: 108 mg/dL — ABNORMAL HIGH (ref 70–99)
Glucose-Capillary: 150 mg/dL — ABNORMAL HIGH (ref 70–99)
Glucose-Capillary: 304 mg/dL — ABNORMAL HIGH (ref 70–99)

## 2018-07-14 MED ORDER — ATORVASTATIN CALCIUM 40 MG PO TABS
40.0000 mg | ORAL_TABLET | Freq: Every day | ORAL | Status: DC
  Administered 2018-07-15: 40 mg via ORAL
  Filled 2018-07-14: qty 1

## 2018-07-14 MED ORDER — DOXYCYCLINE HYCLATE 100 MG PO TABS
100.0000 mg | ORAL_TABLET | Freq: Two times a day (BID) | ORAL | Status: DC
  Administered 2018-07-14 – 2018-07-15 (×3): 100 mg via ORAL
  Filled 2018-07-14 (×4): qty 1

## 2018-07-14 MED ORDER — SODIUM BICARBONATE 650 MG PO TABS
325.0000 mg | ORAL_TABLET | Freq: Two times a day (BID) | ORAL | Status: DC
  Administered 2018-07-14 – 2018-07-15 (×4): 325 mg via ORAL
  Filled 2018-07-14 (×4): qty 1

## 2018-07-14 MED ORDER — FUROSEMIDE 20 MG PO TABS
20.0000 mg | ORAL_TABLET | Freq: Every morning | ORAL | Status: DC
  Administered 2018-07-15: 20 mg via ORAL
  Filled 2018-07-14: qty 1

## 2018-07-14 MED ORDER — APIXABAN 5 MG PO TABS
5.0000 mg | ORAL_TABLET | Freq: Two times a day (BID) | ORAL | Status: DC
  Administered 2018-07-15: 5 mg via ORAL
  Filled 2018-07-14: qty 1

## 2018-07-14 MED ORDER — MONTELUKAST SODIUM 10 MG PO TABS
10.0000 mg | ORAL_TABLET | Freq: Every evening | ORAL | Status: DC
  Administered 2018-07-14 – 2018-07-15 (×2): 10 mg via ORAL
  Filled 2018-07-14 (×2): qty 1

## 2018-07-14 MED ORDER — CARVEDILOL 12.5 MG PO TABS
12.5000 mg | ORAL_TABLET | Freq: Two times a day (BID) | ORAL | Status: DC
  Administered 2018-07-14 – 2018-07-15 (×2): 12.5 mg via ORAL
  Filled 2018-07-14 (×2): qty 1

## 2018-07-14 MED ORDER — METRONIDAZOLE 500 MG PO TABS
500.0000 mg | ORAL_TABLET | Freq: Three times a day (TID) | ORAL | Status: DC
  Administered 2018-07-14 – 2018-07-15 (×5): 500 mg via ORAL
  Filled 2018-07-14 (×5): qty 1

## 2018-07-14 NOTE — Telephone Encounter (Signed)
Patients daughter left a voicemail stating that patient has had a stroke and is at [facility], 3rd floor room 32 if Dr. Lorin Mercy wanted to see her there. She doesn't know if she should r/s appt on Friday since patient could be d/c tomorrow? Please advise # [phone]

## 2018-07-14 NOTE — NC FL2 (Signed)
[place] MEDICAID FL2 LEVEL OF CARE SCREENING TOOL     IDENTIFICATION  Patient Name: Julie Carpenter Birthdate: 1925-09-21 Sex: female Admission Date (Current Location): 07/10/2018  [facility] and [place] Number:  Manufacturing engineer and Address:  [facility], [place] [address], [place], [place] [address]      Provider Number: [id number]  Attending Physician Name and Address:  Bonnielee Haff, MD  Relative Name and Phone Number:       Current Level of Care: Hospital Recommended Level of Care:  Prior Approval Number:    Date Approved/Denied:   PASRR Number: [id number]  Discharge Plan: Other (Comment)(ALF)    Current Diagnoses: Patient Active Problem List   Diagnosis Date Noted  . Malnutrition of moderate degree 07/14/2018  . Acute CVA (cerebrovascular accident) ([place])   . Palliative care encounter   . Hemorrhagic stroke ([place]) 07/10/2018  . Sepsis ([place]) 07/10/2018  . Aspiration pneumonia ([place]) 07/10/2018  . Type 2 diabetes mellitus ([place]) 07/10/2018  . Pressure injury of skin 07/10/2018  . Long term current use of anticoagulant therapy 12/06/2015  . Hypoglycemia 10/27/2015  . MDD (major depressive disorder), recurrent severe, without psychosis ([place]) 10/26/2015  . Dementia with behavioral disturbance ([place]) 10/26/2015  . CKD (chronic kidney disease) stage 4, GFR 15-29 ml/min (HCC)   . Suicidal overdose ([place]) 10/24/2015  . Depression 10/24/2015  . Other acute pulmonary embolism without acute cor pulmonale ([place]) 08/31/2015  . Long term (current) use of anticoagulants [Z79.01] 08/31/2015  . Pulmonary embolism ([place]) 08/23/2015  . Shortness of breath 08/22/2015  . Dyspnea 08/17/2015  . DOE (dyspnea on exertion) 08/17/2015  . GERD (gastroesophageal reflux disease) 02/23/2014  . Peripheral edema 02/23/2014  . GI bleed 04/05/2013  . Atrial fibrillation ([place]) 02/24/2013  . HTN (hypertension) 12/26/2010  . Chronic kidney disease  (CKD) stage G3b/A2, moderately decreased glomerular filtration rate (GFR) between 30-44 mL/min/1.73 square meter and albuminuria creatinine ratio between 30-299 mg/g (HCC) 12/26/2010  . Hyperlipemia 12/26/2010  . Peripheral neuropathy 12/26/2010  . Anemia 12/26/2010  . Diabetes ([place]) 12/26/2010  . Vertebral fracture 12/26/2010  . Venous insufficiency 12/26/2010  . Osteoporosis 12/26/2010  . Thyromegaly 12/26/2010    Orientation RESPIRATION BLADDER Height & Weight     Self, Situation, Place  Normal Incontinent Weight: 167 lb 12.3 oz (76.1 kg) Height:     BEHAVIORAL SYMPTOMS/MOOD NEUROLOGICAL BOWEL NUTRITION STATUS      Continent Diet(puree solids, nectar thick liquids)  AMBULATORY STATUS COMMUNICATION OF NEEDS Skin   Extensive Assist Verbally PU Stage and Appropriate Care   PU Stage 2 Dressing: (Buttocks, foam dressing)                   Personal Care Assistance Level of Assistance  Bathing, Feeding, Dressing Bathing Assistance: Maximum assistance Feeding assistance: Limited assistance Dressing Assistance: Maximum assistance     Functional Limitations Info  Sight, Hearing, Speech Sight Info: Adequate Hearing Info: Adequate Speech Info: Impaired    SPECIAL CARE FACTORS FREQUENCY  PT (By licensed PT), OT (By licensed OT), Speech therapy     PT Frequency: 3x/wk OT Frequency: 3x/wk     Speech Therapy Frequency: 3x/wk      Contractures Contractures Info: Not present    Additional Factors Info  Code Status, Allergies Code Status Info: DNR Allergies Info: Penicillins, Benicar Olmesartan Medoxomil, Ciprofloxacin, Hctz Hydrochlorothiazide, Januvia Sitagliptin Phosphate, Norvasc Amlodipine Besylate, Ramipril           Current Medications (07/14/2018):  This is the current hospital active medication list Current Facility-Administered Medications  Medication Dose Route Frequency Provider Last Rate Last Dose  . acetaminophen (TYLENOL) tablet 650 mg  650 mg Oral Q4H  PRN Shela Leff, MD       Or  . acetaminophen (TYLENOL) solution 650 mg  650 mg Per Tube Q4H PRN Shela Leff, MD       Or  . acetaminophen (TYLENOL) suppository 650 mg  650 mg Rectal Q4H PRN Shela Leff, MD   650 mg at 07/13/18 0706  . [START ON 07/15/2018] apixaban (ELIQUIS) tablet 5 mg  5 mg Oral BID Karren Cobble, RPH      . aspirin EC tablet 81 mg  81 mg Oral Daily Rosalin Hawking, MD       Or  . aspirin suppository 300 mg  300 mg Rectal Daily Rosalin Hawking, MD   300 mg at 07/14/18 1007  . carvedilol (COREG) tablet 12.5 mg  12.5 mg Oral BID WC Bonnielee Haff, MD      . doxycycline (VIBRA-TABS) tablet 100 mg  100 mg Oral Q12H Bonnielee Haff, MD      . Derrill Memo ON 07/15/2018] furosemide (LASIX) tablet 20 mg  20 mg Oral q morning - 10a Bonnielee Haff, MD      . insulin aspart (novoLOG) injection 0-9 Units  0-9 Units Subcutaneous TID WC Shela Leff, MD   1 Units at 07/13/18 1752  . labetalol (NORMODYNE,TRANDATE) injection 5 mg  5 mg Intravenous Q10 min PRN Shela Leff, MD   5 mg at 07/14/18 1201  . metroNIDAZOLE (FLAGYL) tablet 500 mg  500 mg Oral Q8H Bonnielee Haff, MD   500 mg at 07/14/18 1415  . montelukast (SINGULAIR) tablet 10 mg  10 mg Oral QPM Bonnielee Haff, MD      . sodium bicarbonate tablet 325 mg  325 mg Oral BID Bonnielee Haff, MD   325 mg at 07/14/18 1410  . vitamin B-12 (CYANOCOBALAMIN) tablet 500 mcg  500 mcg Oral Daily Bonnielee Haff, MD         Discharge Medications: Please see discharge summary for a list of discharge medications.  Relevant Imaging Results:  Relevant Lab Results:   Additional Information SS#: [id number]; Hospice services to be set up for the patient at ALF.  Geralynn Ochs, LCSW

## 2018-07-14 NOTE — Telephone Encounter (Signed)
Talked with patient's daughter Ivin Booty and advised her to keep the appointment on Friday, 07/17/2018 and if patient is not able to make it, then to just give the office a call on Thursday, 07/16/2018.  See message below.  Thank you

## 2018-07-14 NOTE — Progress Notes (Signed)
[facility] PROGRESS NOTE  Julie Carpenter UVO:[id number] DOB: 1925-11-20 DOA: 07/10/2018  PCP: Vidal Schwalbe, MD  Brief History/Interval Summary:  84 y.o. female with medical history significant of atrial fibrillation on anticoagulation with warfarin, CKD, type 2 diabetes, hypertension, hyperlipidemia, prior VTE [facility] home via EMS for evaluation of tremors.    Patient was very somnolent so history was limited.  There was also concern for left-sided facial droop and left-sided weakness.  Patient also was noted to have a fever with a temperature of 101.2 F.  MRI was done and reviewed an acute infarct in the right MCA territory.  Hemorrhagic infarct was noted in the right frontal operculum and a nonhemorrhagic infarct in the right parietal operculum.  Patient was transferred to [facility] for further evaluation.    Reason for Visit: Acute stroke  Consultants: Neurology  Procedures:  Transthoracic echocardiogram Study Conclusions  - Left ventricle: The cavity size was normal. There was mild focal   basal hypertrophy of the septum. Systolic function was normal.   The estimated ejection fraction was in the range of 60% to 65%.   Wall motion was normal; there were no regional wall motion   abnormalities. - Mitral valve: Calcified annulus. There was moderate regurgitation   directed centrally. - Left atrium: The atrium was mildly dilated. - Tricuspid valve: There was moderate regurgitation. - Pulmonary arteries: Systolic pressure was mildly increased. PA   peak pressure: 45 mm Hg (S).  Carotid Dopplers Right Carotid: Velocities in the right ICA are consistent with a 1-39% stenosis. Left Carotid: Velocities in the left ICA are consistent with a 1-39% stenosis. Vertebrals: Bilateral vertebral arteries demonstrate antegrade flow. Subclavians: Normal flow hemodynamics were seen in bilateral subclavian  arteries.  Antibiotics: Vancomycin, aztreonam and metronidazole Vancomycin was discontinued on 11/3 Aztreonam was discontinued on 11/4 Doxycycline was initiated on 11/4.  Metronidazole continued as well.  Subjective/Interval History: Patient denies any complaints.  She states that she is feeling better.  Seems to be mildly distracted.  No family at the bedside this morning.    ROS: Denies any headaches  Objective:  Vital Signs  Vitals:   07/14/18 0757 07/14/18 0937 07/14/18 1131 07/14/18 1137  BP:  (!) 181/114 (!) 176/108 (!) 183/107  Pulse:  91 91 98  Resp:  15  15  Temp: 98.1 F (36.7 C)   97.9 F (36.6 C)  TempSrc: Axillary   Axillary  SpO2:   98% 98%  Weight:        Intake/Output Summary (Last 24 hours) at 07/14/2018 1154 Last data filed at 07/14/2018 0325 Gross per 24 hour  Intake 350 ml  Output 1100 ml  Net -750 ml   Filed Weights   07/10/18 1326 07/10/18 2301  Weight: 74.4 kg 76.1 kg    General appearance: Asleep but easily arousable.  In no distress. Resp: Normal effort at rest.  Diminished air entry at the bases.  No wheezing or rhonchi.  Few crackles on the right. Cardio: S1-S2 is irregularly irregular.  No S3-S4.  No rubs or bruit.  Systolic murmur at the precordium. GI: Abdomen is soft.  Nontender nondistended. Neurologic: She remains awake alert.  Left-sided weakness has significantly improved.  Tongue is midline.     Lab Results:  Data Reviewed: I have personally reviewed following labs and imaging studies  CBC: Recent Labs  Lab 07/10/18 1352 07/10/18 1403 07/12/18 0458 07/13/18 0630  WBC 11.2*  --  8.6 8.4  NEUTROABS 9.4*  --   --   --   HGB 10.9* 11.2* 9.8* 10.2*  HCT 34.6* 33.0* 32.6* 33.9*  MCV 86.1  --  89.1 88.5  PLT 340  --  280 419    Basic Metabolic Panel: Recent Labs  Lab 07/10/18 1352 07/10/18 1403 07/12/18 0458 07/13/18 0630  NA 135 137 140 141  K 4.0 4.0 3.4* 3.7  CL 103 102 115* 117*  CO2 23  --  21* 19*  GLUCOSE  279* 263* 153* 139*  BUN 24* 24* 24* 22  CREATININE 1.49* 1.50* 1.53* 1.41*  CALCIUM 9.4  --  9.0 9.1    GFR: Estimated Creatinine Clearance: 27.6 mL/min (A) (by C-G formula based on SCr of 1.41 mg/dL (H)).  Liver Function Tests: Recent Labs  Lab 07/10/18 1352  AST 25  ALT 15  ALKPHOS 139*  BILITOT 1.5*  PROT 7.7  ALBUMIN 3.4*    Recent Labs  Lab 07/10/18 1515  LIPASE 32   Coagulation Profile: Recent Labs  Lab 07/10/18 1352  INR 1.23    CBG: Recent Labs  Lab 07/12/18 2137 07/13/18 0627 07/13/18 1250 07/13/18 2128 07/14/18 0623  GLUCAP 137* 139* 141* 143* 108*     Recent Results (from the past 240 hour(s))  Culture, blood (routine x 2)     Status: None (Preliminary result)   Collection Time: 07/10/18  3:15 PM  Result Value Ref Range Status   Specimen Description BLOOD LEFT ANTECUBITAL  Final   Special Requests   Final    BOTTLES DRAWN AEROBIC AND ANAEROBIC Blood Culture adequate volume   Culture   Final    NO GROWTH 4 DAYS Performed at [facility], [address] [place], [place] [address]    Report Status PENDING  Incomplete  Culture, blood (routine x 2)     Status: None (Preliminary result)   Collection Time: 07/10/18  4:11 PM  Result Value Ref Range Status   Specimen Description BLOOD RIGHT WRIST  Final   Special Requests   Final    BOTTLES DRAWN AEROBIC ONLY Blood Culture results may not be optimal due to an inadequate volume of blood received in culture bottles   Culture   Final    NO GROWTH 4 DAYS Performed at [facility], [address], [place], [place] [address]    Report Status PENDING  Incomplete      Radiology Studies: No results found.   Medications:  Scheduled: . aspirin EC  81 mg Oral Daily   Or  . aspirin  300 mg Rectal Daily  . insulin aspart  0-9 Units Subcutaneous TID WC  . vitamin B-12  500 mcg Oral Daily   Continuous: . doxycycline (VIBRAMYCIN) IV 100 mg (07/14/18 0046)  . metronidazole 500 mg (07/14/18 0640)    ZHG:[id number] **OR** acetaminophen (TYLENOL) oral liquid 160 mg/5 mL **OR** acetaminophen, labetalol   Assessment/Plan:  Acute stroke Multiple infarcts seen on MRI. One of the infarcts has an hemorrhagic component.  Patient transferred to [facility] for higher level of care.  Patient was seen by neurology.  Echocardiogram and carotid Doppler as above.  LDL 123.  She will need a statin at discharge.  HbA1c 7.9.  Skilled nursing facility is recommended by therapy.  Patient is on aspirin.  Her warfarin was held due to the component of hemorrhage.  Neurology recommends starting anticoagulation on 11/7.  They recommend Eliquis which they have discussed with the patient and family.  Oropharyngeal dysphagia This is secondary to her stroke.  Apparently patient is not safe for any level of diet according to the speech therapist.  If at all comfort feeds will have to be pursued.  Palliative medicine was consulted.  Goals of care conversation to be continued today.  Remains n.p.o.    Acute metabolic encephalopathy Likely multifactorial including stroke, fever and medications.  Apparently she was given fentanyl at the outside facility.  Mentation significantly improved and could be close to her baseline.  Avoid narcotics.  Sepsis secondary to aspiration pneumonia CT scan of the abdomen and pelvis was done as the patient was noted to be tender.  No acute findings noted.  UA does not suggest UTI.  Chest x-ray did suggest aspiration.  Patient was started on broad-spectrum coverage with vancomycin and aztreonam and metronidazole.  She is noted to be allergic to penicillin.  Respiratory status remains stable to improved.  Lactic acid level was 1.9 and procalcitonin was less than 0.1.  Lipase level was normal.  Vancomycin and aztreonam discontinued.  Patient remains on doxycycline and metronidazole currently.  Both intravenous due to her dysphagia.    History of chronic atrial fibrillation Rate is  controlled.  Continue to monitor on telemetry.  Patient was on carvedilol at home which is held due to risk for aspiration.  Use IV beta-blocker as needed.  Chads 2 vascular score is 7.  She was on warfarin which is being held due to the hemorrhagic component noted on MRI brain.  As discussed above neurology recommends Eliquis starting 11/7.  They have discussed this with patient and family.  TSH is normal.  Chronic kidney disease stage IV/hypokalemia Renal function has been stable.  Recheck labs tomorrow.    Diabetes mellitus type 2 with renal complications Continue to monitor CBGs.  HbA1c is 7.9.  SSI.  CBGs are reasonably well controlled.  Essential hypertension Using IV labetalol as needed for blood pressure control with parameters as outlined by neurology.  Initiate oral medications she is able to take orally.  This will depend on the discussions with family.  Letter to medicine is following.  Hyperlipidemia LDL is 123.  No statin noted on her home medication list.  This will need to be initiated once she is able to take orally.  Normocytic anemia/vitamin B12 deficiency Drop in hemoglobin is likely dilutional.  No evidence for overt bleeding.  Hemoglobin is stable.  Low normal vitamin B12 level noted.  This will be supplemented when she is able to take orally.  Sacral decubitus stage II Wound care.  DVT Prophylaxis: SCDs    Code Status: DNR Family Communication: No family at bedside today Disposition Plan: Management as outlined above.  Await further palliative medicine input.    LOS: 4 days   [place] [facility] Pager [phone] 07/14/2018, 11:54 AM  If 7PM-7AM, please contact night-coverage at [email or web address], password [facility]

## 2018-07-14 NOTE — Progress Notes (Signed)
Daily Progress Note   Patient Name: Julie Carpenter       Date: 07/14/2018 DOB: [birth year]  Age: 84 y.o. MRN#: [id number] Attending Physician: Bonnielee Haff, MD Primary Care Physician: Vidal Schwalbe, MD Admit Date: 07/10/2018  Reason for Consultation/Follow-up: Establishing goals of care and Psychosocial/spiritual support  Subjective: Spoke with patient, two daughters, SIL and grand dtr (who is a Marine scientist) at bedside.  Family very concerned about the patients nutrition and ability to eat.  Patient and daughters decided together that they do not want a feeding tube.  Family understands the high risk of aspiration but wants to move forward with feeding.  Family states the patient has been coughing when she eats for years.  Discussed with SLP Tammy who recommended the following:  Sit up very straight to feed, tight chin tuck, D1 diet with, nectar thick liquids, and meds crushed in apple sauce.  We talked further while patient began enjoying some ice-cream.    Per daughter, Julie Carpenter, patient would like to return to [facility] where she is from.  Further [place] will take her back on Hospice services.  [place] has a relationship with [facility].  [facility] is willing to provide Hospice services along with PT/OT/and Speech.   I expressed to Julie Carpenter that it is quite unusual for the patient to receive PT/OT and SLP therapy on Hospice, but it is the best possible situation for her mother.  Discussed with Dr. Maryland Pink.  He would like to progress Julie Carpenter from IV to oral medications this evening and re-assess for discharge potentially tomorrow.   Assessment: Patient post acute strokes.  Multiple infarcts noted on imaging.  She developed sepsis due to aspiration pneumonia.   She seems  more alert today.  Lifting her head and interacting.  Smiling and asking for ice cream.  She was able to take a couple of steps with assistance to sit in a chair with PT.   Patient Profile/HPI:  84 y.o. female  with past medical history of PE, DVT, CKD IV, Afib who was admitted on 07/10/2018 with multiple strokes.  Hospital work up has revealed aspiration with any level of diet.  Echo shows mitral and tricuspid regurg.   Length of Stay: 4  Current Medications: Scheduled Meds:  . aspirin EC  81 mg Oral Daily  Or  . aspirin  300 mg Rectal Daily  . carvedilol  12.5 mg Oral BID WC  . doxycycline  100 mg Oral Q12H  . [START ON 07/15/2018] furosemide  20 mg Oral q morning - 10a  . insulin aspart  0-9 Units Subcutaneous TID WC  . metroNIDAZOLE  500 mg Oral Q8H  . montelukast  10 mg Oral QPM  . sodium bicarbonate  325 mg Oral BID  . vitamin B-12  500 mcg Oral Daily    Continuous Infusions:   PRN Meds: acetaminophen **OR** acetaminophen (TYLENOL) oral liquid 160 mg/5 mL **OR** acetaminophen, labetalol  Physical Exam       Elderly frail female awake alert appropriate with dysarthria.  Multiple family members at bedside.   Vital Signs: BP (!) 183/107 (BP Location: Left Arm)   Pulse 98   Temp 97.9 F (36.6 C) (Axillary)   Resp 15   Wt 76.1 kg   SpO2 98%   BMI 27.08 kg/m  SpO2: SpO2: 98 % O2 Device: O2 Device: Room Air O2 Flow Rate:    Intake/output summary:   Intake/Output Summary (Last 24 hours) at 07/14/2018 1253 Last data filed at 07/14/2018 0325 Gross per 24 hour  Intake 350 ml  Output 1100 ml  Net -750 ml   LBM: Last BM Date: 07/10/18 Baseline Weight: Weight: 74.4 kg Most recent weight: Weight: 76.1 kg       Palliative Assessment/Data: 30%      Patient Active Problem List   Diagnosis Date Noted  . Malnutrition of moderate degree 07/14/2018  . Palliative care encounter   . Hemorrhagic stroke ([place]) 07/10/2018  . Sepsis ([place]) 07/10/2018  . Aspiration pneumonia  ([place]) 07/10/2018  . Type 2 diabetes mellitus ([place]) 07/10/2018  . Pressure injury of skin 07/10/2018  . Long term current use of anticoagulant therapy 12/06/2015  . Hypoglycemia 10/27/2015  . MDD (major depressive disorder), recurrent severe, without psychosis ([place]) 10/26/2015  . Dementia with behavioral disturbance ([place]) 10/26/2015  . CKD (chronic kidney disease) stage 4, GFR 15-29 ml/min (HCC)   . Suicidal overdose ([place]) 10/24/2015  . Depression 10/24/2015  . Other acute pulmonary embolism without acute cor pulmonale ([place]) 08/31/2015  . Long term (current) use of anticoagulants [Z79.01] 08/31/2015  . Pulmonary embolism ([place]) 08/23/2015  . Shortness of breath 08/22/2015  . Dyspnea 08/17/2015  . DOE (dyspnea on exertion) 08/17/2015  . GERD (gastroesophageal reflux disease) 02/23/2014  . Peripheral edema 02/23/2014  . GI bleed 04/05/2013  . Atrial fibrillation ([place]) 02/24/2013  . HTN (hypertension) 12/26/2010  . Chronic kidney disease (CKD) stage G3b/A2, moderately decreased glomerular filtration rate (GFR) between 30-44 mL/min/1.73 square meter and albuminuria creatinine ratio between 30-299 mg/g (HCC) 12/26/2010  . Hyperlipemia 12/26/2010  . Peripheral neuropathy 12/26/2010  . Anemia 12/26/2010  . Diabetes ([place]) 12/26/2010  . Vertebral fracture 12/26/2010  . Venous insufficiency 12/26/2010  . Osteoporosis 12/26/2010  . Thyromegaly 12/26/2010    Palliative Care Plan    Recommendations/Plan:  No PEG tube.  DNR / DNI  D/C to [place] when medically appropriate with [facility] (who has reportedly agreed to offer PT/OT/and Speech  Initiate D1 diet with nectar thick as recommended by SLP.  Code Status:  DNR  Prognosis:   < 6 months Perhaps significantly less.  Much is dependent on her ability to take in sufficient hydration and avoid aspiration.   Discharge Planning:  ALF with Hospice  Care plan was discussed with [facility] MD, bedside RN, SLP, Family.  Thank  you for allowing the Palliative Medicine Team to assist in the care of this patient.  Total time spent:  60 min.     Greater than 50%  of this time was spent counseling and coordinating care related to the above assessment and plan.  Florentina Jenny, PA-C Palliative Medicine  Please contact Palliative MedicineTeam phone at [phone] for questions and concerns between 7 am - 7 pm.   Please see AMION for individual provider pager numbers.

## 2018-07-14 NOTE — Progress Notes (Signed)
ANTICOAGULATION CONSULT NOTE - Initial Consult  Pharmacy Consult for Eliquis Indication: Afib, h/o VTE, CVA  Allergies  Allergen Reactions  . Penicillins Anaphylaxis    Has patient had a PCN reaction causing immediate rash, facial/tongue/throat swelling, SOB or lightheadedness with hypotension: Yes Has patient had a PCN reaction causing severe rash involving mucus membranes or skin necrosis: No Has patient had a PCN reaction that required hospitalization No Has patient had a PCN reaction occurring within the last 10 years: No If all of the above answers are "NO", then may proceed with Cephalosporin use.  Marland Kitchen Benicar [Olmesartan Medoxomil] Swelling    Lips swelling  . Ciprofloxacin Swelling  . Hctz [Hydrochlorothiazide] Swelling    Lips swelling  . Januvia [Sitagliptin Phosphate] Swelling    Lips swelling  . Norvasc [Amlodipine Besylate] Swelling    Lips swelling  . Ramipril Swelling    Lips swelling    Patient Measurements: Weight: 167 lb 12.3 oz (76.1 kg)  Vital Signs: Temp: 97.9 F (36.6 C) (11/05 1137) Temp Source: Axillary (11/05 1137) BP: 183/107 (11/05 1137) Pulse Rate: 98 (11/05 1137)  Labs: Recent Labs    07/12/18 0458 07/13/18 0630  HGB 9.8* 10.2*  HCT 32.6* 33.9*  PLT 280 226  CREATININE 1.53* 1.41*    Estimated Creatinine Clearance: 27.6 mL/min (A) (by C-G formula based on SCr of 1.41 mg/dL (H)).   Medical History: Past Medical History:  Diagnosis Date  . Anemia   . Atrial fibrillation ([place])   . Chronic kidney disease   . Diabetes mellitus without complication ([place])   . DVT (deep venous thrombosis) ([place])   . Hyperlipidemia   . Hypertension   . PE (pulmonary embolism)    Assessment:  Anticoag: warfarin PTA (2 mg daily) - stopped at OSH for petechial hemorrhagic transformation seen on MRI.  Plan to start Eliquis 11/7 per  - 84 y/o, 76kg, Scr 1.41, CRCl 27  Goal of Therapy:  Therapeutic oral anticoagulation    Plan:  F/u to start new  eliquis 11/7 per stroke team: 5mg  BID   Julie Carpenter S. Alford Highland, PharmD, BCPS Clinical Staff Pharmacist  Julie Carpenter 07/14/2018,3:09 PM

## 2018-07-14 NOTE — Telephone Encounter (Signed)
[facility]. She can just followup once she gets out of hospital several days later.

## 2018-07-14 NOTE — Progress Notes (Signed)
SLP Cancellation Note  Patient Details Name: [name] MRN: [id number] DOB: Feb 06, 1926   Cancelled treatment:       Reason Eval/Treat Not Completed: Other (comment)(await palliative input for pt/family to determine PEG vs comfort po, will defer speech eval as nutrition goals paramount)   Macario Golds 07/14/2018, 10:04 AM  Luanna Salk, MS [facility] SLP Acute Rehab Services Pager [phone] Office [phone]

## 2018-07-14 NOTE — Care Management Important Message (Signed)
Important Message  Patient Details  Name: PAGE PUCCIARELLI MRN: [id number] Date of Birth: November 02, 1925   Medicare Important Message Given:  Yes    Orbie Pyo 07/14/2018, 3:01 PM

## 2018-07-15 DIAGNOSIS — D518 Other vitamin B12 deficiency anemias: Secondary | ICD-10-CM

## 2018-07-15 LAB — BASIC METABOLIC PANEL
ANION GAP: 6 (ref 5–15)
BUN: 31 mg/dL — ABNORMAL HIGH (ref 8–23)
CALCIUM: 9.6 mg/dL (ref 8.9–10.3)
CO2: 19 mmol/L — ABNORMAL LOW (ref 22–32)
Chloride: 117 mmol/L — ABNORMAL HIGH (ref 98–111)
Creatinine, Ser: 1.58 mg/dL — ABNORMAL HIGH (ref 0.44–1.00)
GFR, EST AFRICAN AMERICAN: 32 mL/min — AB (ref >60)
GFR, EST NON AFRICAN AMERICAN: 28 mL/min — AB (ref >60)
GLUCOSE: 383 mg/dL — AB (ref 70–99)
POTASSIUM: 3.6 mmol/L (ref 3.5–5.1)
Sodium: 142 mmol/L (ref 135–145)

## 2018-07-15 LAB — GLUCOSE, CAPILLARY
GLUCOSE-CAPILLARY: 275 mg/dL — AB (ref 70–99)
Glucose-Capillary: 296 mg/dL — ABNORMAL HIGH (ref 70–99)
Glucose-Capillary: 292 mg/dL — ABNORMAL HIGH (ref 70–99)
Glucose-Capillary: 290 mg/dL — ABNORMAL HIGH (ref 70–99)

## 2018-07-15 LAB — CBC
HCT: 36.7 % (ref 36.0–46.0)
Hemoglobin: 11.1 g/dL — ABNORMAL LOW (ref 12.0–15.0)
MCH: 27.3 pg (ref 26.0–34.0)
MCHC: 30.2 g/dL (ref 30.0–36.0)
MCV: 90.2 fL (ref 80.0–100.0)
NRBC: 0 % (ref 0.0–0.2)
PLATELETS: 276 10*3/uL (ref 150–400)
RBC: 4.07 MIL/uL (ref 3.87–5.11)
RDW: 16.2 % — AB (ref 11.5–15.5)
WBC: 13.1 10*3/uL — AB (ref 4.0–10.5)

## 2018-07-15 LAB — CULTURE, BLOOD (ROUTINE X 2)
Culture: NO GROWTH
Culture: NO GROWTH
SPECIAL REQUESTS: ADEQUATE

## 2018-07-15 MED ORDER — METRONIDAZOLE 500 MG PO TABS: 500.0000 mg | ORAL_TABLET | Freq: Three times a day (TID) | ORAL | Status: AC

## 2018-07-15 MED ORDER — CARVEDILOL 25 MG PO TABS: 25.0000 mg | ORAL_TABLET | Freq: Two times a day (BID) | ORAL | Status: AC

## 2018-07-15 MED ORDER — ATORVASTATIN CALCIUM 40 MG PO TABS: 40.0000 mg | ORAL_TABLET | Freq: Every day | ORAL | Status: AC

## 2018-07-15 MED ORDER — CYANOCOBALAMIN 500 MCG PO TABS: 500.0000 ug | ORAL_TABLET | Freq: Every day | ORAL | Status: AC

## 2018-07-15 MED ORDER — LABETALOL HCL 5 MG/ML IV SOLN
10.0000 mg | INTRAVENOUS | Status: DC | PRN
  Administered 2018-07-15: 10 mg via INTRAVENOUS
  Filled 2018-07-15: qty 4

## 2018-07-15 MED ORDER — DOXYCYCLINE HYCLATE 100 MG PO TABS: 100.0000 mg | ORAL_TABLET | Freq: Two times a day (BID) | ORAL | Status: AC

## 2018-07-15 MED ORDER — ASPIRIN 81 MG PO TBEC: 81.0000 mg | DELAYED_RELEASE_TABLET | Freq: Every day | ORAL | Status: DC

## 2018-07-15 MED ORDER — CARVEDILOL 12.5 MG PO TABS
25.0000 mg | ORAL_TABLET | Freq: Two times a day (BID) | ORAL | Status: DC
  Administered 2018-07-15: 25 mg via ORAL
  Filled 2018-07-15: qty 2

## 2018-07-15 MED ORDER — APIXABAN 2.5 MG PO TABS
2.5000 mg | ORAL_TABLET | Freq: Two times a day (BID) | ORAL | Status: DC
  Administered 2018-07-15: 2.5 mg via ORAL
  Filled 2018-07-15: qty 1

## 2018-07-15 MED ORDER — APIXABAN 5 MG PO TABS: 5.0000 mg | ORAL_TABLET | Freq: Two times a day (BID) | ORAL | Status: AC

## 2018-07-15 NOTE — Telephone Encounter (Signed)
Noted. Thank You.

## 2018-07-15 NOTE — Telephone Encounter (Signed)
Called and left a VM advising patient's daughter Ivin Booty of message below per Dr. Lorin Mercy.

## 2018-07-15 NOTE — Progress Notes (Deleted)
  Speech Language Pathology Treatment: Dysphagia  Patient Details Name: Julie Carpenter MRN: [id number] DOB: 07-28-1926 Today's Date: 07/15/2018 Time: 1140-1150 SLP Time Calculation (min) (ACUTE ONLY): 10 min  Assessment / Plan / Recommendation Clinical Impression  Patient was awake when entering room, symptoms consistent with global aphasia. No attempt to respond to therapist or follow any commands. Oral care was attempted. Pt was not able to open mouth on verbal command or tactile cues. She turned her head in response to an ice chips and applesauce. Discharge plan is for patient to discharge to a facility with a NG tube (temporarily). Her son continues to refuse a PEG tube. If patient is not able to take PO diet, they will pursue comfort measure. Due to continued refusal of PO's, speech therapy will sign off.    HPI HPI: pt is a 84 yo adm to [facility] - transferred from [facility].  Pt with recent fall with left wrist fx, found to have a CVA with concern for aspiraton pna.  Swallow evaluation ordered.  Pt also with shingles.  CXR concerning for pna.  Pt has been lethargic over the last few days.  Marland Kitchen        SLP Plan  Patient is being discharged due to continued refusal/head turn to PO trials.        Recommendations  Diet recommendations: NPO Medication Administration: Via alternative means                Oral Care Recommendations: Oral care QID SLP Visit Diagnosis: Dysphagia, oropharyngeal phase (R13.12) Plan: Discharge SLP treatment due to (comment)(Patient contiues to refuse PO, continue efforts at SNF)       [place], MA, CCC-SLP 07/15/2018 1:20 PM

## 2018-07-15 NOTE — Consult Note (Signed)
            [facility] CM Primary Care Navigator  07/15/2018  JACKIE RUSSMAN [birth year] [id number]   Wenttosee patientat the bedsideto identify possible discharge needsbut RN reports, MD note states that discharge disposition is to return to assisted living facility and will need to be followed by hospice.  Per MD note, patient presented to [facility] from her nursing facility for evaluation of tremors.There was a concern for left-sided facial droop and left-sided weakness, was noted to have a fever andMRI showed an acute infarct. She was transferred to [facility] for higher level of care and was seen by neurology. (acute CVA, sepsis secondary to aspiration pneumonia, oropharyngeal dysphagia)  Palliative care consulted, daughter would like patient to return to [facility] where she is from, with Hospice services (patient to receive PT/ OT and SLP therapy on Hospice).  No furtheridentifiable [facility] Care Management needsat this point.   For additional questions please contact:  Edwena Felty A. Eythan Jayne, BSN, RN-BC [facility] PRIMARY CARE Navigator Cell: [phone]

## 2018-07-15 NOTE — Telephone Encounter (Signed)
FYI Spoke with Ivin Booty and rescheduled to 11/15

## 2018-07-15 NOTE — Progress Notes (Signed)
Discharge to: [facility] Anticipated discharge date: 07/15/18 Family notified: Yes, daughter Ivin Booty at bedside Transportation by: [facility]  Report #: [phone]  Transport set for 4:00 PM.  CSW signing off.  Laveda Abbe LCSW [phone]

## 2018-07-15 NOTE — Progress Notes (Signed)
Patient received 2200 medication prior to discharge. Patient was comfortable and resting upon discharge with [facility] transport to prior facility, [facility].

## 2018-07-15 NOTE — Discharge Summary (Signed)
Physician Discharge Summary   Patient ID: Julie Carpenter MRN: [id number] DOB/AGE: [id number] 82 y.o.  Admit date: 07/10/2018 Discharge date: 07/15/2018  Primary Care Physician:  Vidal Schwalbe, MD   Recommendations for Outpatient Follow-up:  1. Follow up with PCP in 1-2 weeks 2. Patient will need to followed by hospice at the ALF 3. Warfarin has been discontinued, patient now placed on [place]: Home health PT OT, needs swallow evaluation to follow Equipment/Devices:   Discharge Condition: Guarded CODE STATUS: DNR Diet recommendation: Dysphagia 1 with nectar thick liquids   Discharge Diagnoses:     Acute CVA, hemorrhagic Oropharyngeal dysphagia Aspiration pneumonia with sepsis Acute metabolic encephalopathy . Atrial fibrillation ([place]) . HTN (hypertension) . Hyperlipidemia . Anemia of chronic disease CKD stage IV Sacral decub stage II   Consults: Neurology stroke service Palliative medicine    Allergies:   Allergies  Allergen Reactions  . Penicillins Anaphylaxis    Has patient had a PCN reaction causing immediate rash, facial/tongue/throat swelling, SOB or lightheadedness with hypotension: Yes Has patient had a PCN reaction causing severe rash involving mucus membranes or skin necrosis: No Has patient had a PCN reaction that required hospitalization No Has patient had a PCN reaction occurring within the last 10 years: No If all of the above answers are "NO", then may proceed with Cephalosporin use.  Marland Kitchen Benicar [Olmesartan Medoxomil] Swelling    Lips swelling  . Ciprofloxacin Swelling  . Hctz [Hydrochlorothiazide] Swelling    Lips swelling  . Januvia [Sitagliptin Phosphate] Swelling    Lips swelling  . Norvasc [Amlodipine Besylate] Swelling    Lips swelling  . Ramipril Swelling    Lips swelling     DISCHARGE MEDICATIONS: Allergies as of 07/15/2018      Reactions   Penicillins Anaphylaxis   Has patient had a PCN reaction causing immediate  rash, facial/tongue/throat swelling, SOB or lightheadedness with hypotension: Yes Has patient had a PCN reaction causing severe rash involving mucus membranes or skin necrosis: No Has patient had a PCN reaction that required hospitalization No Has patient had a PCN reaction occurring within the last 10 years: No If all of the above answers are "NO", then may proceed with Cephalosporin use.   Benicar [olmesartan Medoxomil] Swelling   Lips swelling   Ciprofloxacin Swelling   Hctz [hydrochlorothiazide] Swelling   Lips swelling   Januvia [sitagliptin Phosphate] Swelling   Lips swelling   Norvasc [amlodipine Besylate] Swelling   Lips swelling   Ramipril Swelling   Lips swelling      Medication List    STOP taking these medications   traMADol 50 MG tablet Commonly known as:  ULTRAM   warfarin 2 MG tablet Commonly known as:  COUMADIN     TAKE these medications   acetaminophen 500 MG tablet Commonly known as:  TYLENOL Take 500 mg by mouth every 4 (four) hours as needed for mild pain or moderate pain.   apixaban 5 MG Tabs tablet Commonly known as:  ELIQUIS Take 1 tablet (5 mg total) by mouth 2 (two) times daily.   atorvastatin 40 MG tablet Commonly known as:  LIPITOR Take 1 tablet (40 mg total) by mouth daily at 6 PM.   calcitRIOL 0.25 MCG capsule Commonly known as:  ROCALTROL Take 0.25 mcg by mouth Every Tuesday,Thursday,and Saturday with dialysis.   carvedilol 25 MG tablet Commonly known as:  COREG Take 1 tablet (25 mg total) by mouth 2 (two) times daily with a meal. What changed:  medication strength  how much to take   Cholecalciferol 50 MCG (2000 UT) Caps Take 4,000 Units by mouth daily.   doxycycline 100 MG tablet Commonly known as:  VIBRA-TABS Take 1 tablet (100 mg total) by mouth 2 (two) times daily for 4 days.   fluticasone 50 MCG/ACT nasal spray Commonly known as:  FLONASE Place 1 spray into both nostrils daily.   furosemide 40 MG tablet Commonly  known as:  LASIX Take 40 mg by mouth every morning.   glimepiride 1 MG tablet Commonly known as:  AMARYL Take 1 tablet (1 mg total) by mouth daily with breakfast. What changed:  how much to take   magnesium hydroxide 400 MG/5ML suspension Commonly known as:  MILK OF MAGNESIA Take 30 mLs by mouth at bedtime as needed for mild constipation.   metroNIDAZOLE 500 MG tablet Commonly known as:  FLAGYL Take 1 tablet (500 mg total) by mouth 3 (three) times daily for 4 days.   montelukast 10 MG tablet Commonly known as:  SINGULAIR Take 10 mg by mouth every evening.   Psyllium 400 MG Caps Take 400 mg by mouth every morning.   senna 8.6 MG Tabs tablet Commonly known as:  SENOKOT Take 1 tablet by mouth daily as needed for mild constipation or moderate constipation.   sodium bicarbonate 325 MG tablet Take 325 mg by mouth 2 (two) times daily.   vitamin B-12 500 MCG tablet Commonly known as:  CYANOCOBALAMIN Take 1 tablet (500 mcg total) by mouth daily.        Brief H and P: For complete details please refer to admission H and P, but in brief 84 y.o.femalewith medical history significant ofatrial fibrillation on anticoagulation with warfarin, CKD, type 2 diabetes, hypertension, hyperlipidemia, prior VTE presented to [facility] from her nursing home via EMS for evaluation of tremors.   Patient was very somnolent so history was limited.  There was also concern for left-sided facial droop and left-sided weakness.  Patient also was noted to have a fever with a temperature of 101.2 F.  MRI was done and reviewed an acute infarct in the right MCA territory.  Hemorrhagic infarct was noted in the right frontal operculum and a nonhemorrhagic infarct in the right parietal operculum  Hospital Course:   Acute CVA MRI of the brain showed acute infarct right MCA territory with hemorrhagic infarct right frontal operculum and nonhemorrhagic infarct in the right parietal upper  operculum Patient was [facility] for higher level of care, was seen by neurology. 2D echo showed normal systolic function, EF 60 to 65%, moderate MR, PA pressure 45 Carotid Dopplers showed 1 to 39% right and left ICA stenosis Hemoglobin A1c 7.9, LDL 123, placed on Lipitor Neurology was consulted, warfarin was held due to component of hemorrhage. Neurology recommended starting anticoagulation with Eliquis after discussing with the patient and the family. Palliative medicine was also consulted for goals of care, recommended patient return to ALF with hospice and home health PT OT, speech  Oropharyngeal dysphagia Likely due to stroke, patient was followed by speech therapy and recommended not safe for any level of diet.  If at all comfort feeds will have to be pursued Palliative medicine was consulted and discussed with patient's family, no PEG tube at this time, patient started on comfort feeds.  Acute metabolic encephalopathy Likely due to CVA, medications, fever, apparently was given fentanyl at the outside facility Currently improved, appears to be close to her baseline, avoid narcotics  Sepsis secondary to aspiration pneumonia CT abdomen pelvis showed no acute findings.  UA suggested no UTI.  His chest x-ray suggested aspiration. Initially placed on IV vancomycin, aztreonam and metronidazole, noted to be allergic to penicillin. Narrowed to doxycycline and metronidazole, continue for 4 more days to complete full course of antibiotics.  chronic atrial fibrillation Currently rate controlled, [place] vasc 7 Continue Coreg, previously was on warfarin which was held due to hemorrhagic component on the MRI.  Neurology discussed with family and recommended Eliquis. TSH normal  CKD stage IV Creatinine close to baseline between 1.4-1.5  Diabetes mellitus type 2 with renal complications Hemoglobin A1c 7.9  Essential hypertension Patient was placed on IV antihypertensives as  needed, continue Coreg for now, increase to 25 mg twice a day due to uncontrolled hypertension   Hyperlipidemia LDL 123, started on statin  Normocytic anemia, likely anemia of chronic disease, B12 deficiency No overt bleeding, B12 supplemented H&H currently stable 11.1 at the time of discharge   Sacral decubitus stage II Continue wound care    Day of Discharge S: No acute issues overnight.  No fevers or chills.  BP elevated, prior to antihypertensives.   BP (!) 184/107   Pulse 87   Temp 97.9 F (36.6 C) (Axillary)   Resp 19   Wt 76.1 kg   SpO2 100%   BMI 27.08 kg/m   Physical Exam: General: Alert and awake oriented x3 not in any acute distress. HEENT: anicteric sclera, pupils reactive to light and accommodation CVS: S1-S2 clear no murmur rubs or gallops Chest: clear to auscultation bilaterally, no wheezing rales or rhonchi Abdomen: soft nontender, nondistended, normal bowel sounds Extremities: no cyanosis, clubbing or edema noted bilaterally Neuro: Cranial nerves II-XII intact, no focal neurological deficits   The results of significant diagnostics from this hospitalization (including imaging, microbiology, ancillary and laboratory) are listed below for reference.      Procedures/Studies:  Ct Abdomen Pelvis Wo Contrast  Result Date: 07/10/2018 CLINICAL DATA:  Generalized abdominal pain and tenderness EXAM: CT ABDOMEN AND PELVIS WITHOUT CONTRAST TECHNIQUE: Multidetector CT imaging of the abdomen and pelvis was performed following the standard protocol without IV contrast. COMPARISON:  Renal ultrasound from 2012 by report FINDINGS: Lower chest: No focal infiltrate is seen. Minimal atelectatic changes are noted. Coronary calcifications are seen. Hepatobiliary: No focal liver abnormality is seen. Status post cholecystectomy. No biliary dilatation. Common bile duct is prominent consistent with the post cholecystectomy state. Pancreas: Unremarkable. No pancreatic ductal  dilatation or surrounding inflammatory changes. Spleen: Normal in size without focal abnormality. Adrenals/Urinary Tract: Adrenal glands are within normal limits bilaterally. The kidneys are well visualized without renal calculi. Rounded hypodensity is noted within the left kidney consistent with a cyst. This is stable by report from 2012. the bladder is well distended. A small focus of air is noted within the bladder likely related to instrumentation. Clinical correlation is recommended. Stomach/Bowel: Scattered diverticular change of the colon is noted without evidence of diverticulitis. The appendix is not well visualized although no inflammatory changes to suggest appendicitis are seen. Small bowel and stomach are unremarkable. Vascular/Lymphatic: Aortic atherosclerosis. No enlarged abdominal or pelvic lymph nodes. Reproductive: Status post hysterectomy. No adnexal masses. Other: No abdominal wall hernia or abnormality. No abdominopelvic ascites. Musculoskeletal: Degenerative changes of the lumbar spine are seen. Old compression deformities are seen at L2 and L4. No acute bony abnormality is seen. IMPRESSION: Chronic changes as described above. No acute abnormality is identified. Electronically Signed   By: Elta Gu[place]oupe  Lukens M.D.   On: 07/10/2018 19:15   Dg Chest 1 View  Result Date: 06/18/2018 CLINICAL DATA:  Unwitnessed falls today. EXAM: CHEST  1 VIEW COMPARISON:  Two-view chest x-ray 08/22/2015 FINDINGS: The heart size is exaggerate by low lung volumes. There is mild increase in a diffuse interstitial pattern. Atherosclerotic calcifications are present at the aorta. No focal airspace disease is present. No significant effusions are present. Visualized bony thorax is stable. No acute fractures are present. There is no pneumothorax. IMPRESSION: 1. Low lung volumes. 2. Increased interstitial pattern.  Question mild edema. 3. No acute trauma. 4. Aortic atherosclerosis. Electronically Signed   By: San Morelle M.D.   On: 06/18/2018 08:38   Dg Wrist Complete Left  Result Date: 06/18/2018 CLINICAL DATA:  Unwitnessed fall. Left wrist pain. Initial encounter. EXAM: LEFT WRIST - COMPLETE 3+ VIEW COMPARISON:  None. FINDINGS: The bones are diffusely osteopenic. There is a comminuted, impacted fracture of the distal radius with extension to the distal articular surface and with dorsal angulation. There is also a mildly displaced ulnar styloid fracture. There is no dislocation. Mild-to-moderate degenerative changes are noted at the first [facility] joint. Soft tissue swelling is noted about the wrist. IMPRESSION: Distal radius and ulnar fractures. Electronically Signed   By: Logan Bores M.D.   On: 06/18/2018 08:38   Ct Head Wo Contrast  Result Date: 07/10/2018 CLINICAL DATA:  Left-sided weakness and slurred speech EXAM: CT HEAD WITHOUT CONTRAST TECHNIQUE: Contiguous axial images were obtained from the base of the skull through the vertex without intravenous contrast. COMPARISON:  June 18, 2018 FINDINGS: Brain: There is mild diffuse atrophy stable. There is no intracranial mass, hemorrhage, extra-axial fluid collection, or midline shift. There is new cytotoxic edema involving the right frontal-temporal junction which includes portions of the lateral anterior right lentiform nucleus, claustrum, anterior aspect of the right extreme capsule, and a portion of the right insular cortex. Brain parenchyma in the nearby periphery of the right posterior frontal and anterior temporal lobe regions involved as well with this apparent acute infarct. Elsewhere, there is evidence of a prior infarct in the mid left frontal lobe. There is evidence of a prior infarct in the medial right occipital lobe. There is also evidence of a prior infarct involving a portion of the anteromedial left occipital lobe. There is periventricular small vessel disease in the centra semiovale bilaterally. Vascular: No evident hyperdense vessel. There is  calcification in each carotid siphon region. Skull: Bony calvarium appears intact. Sinuses/Orbits: There is opacification of the left sphenoid sinus with mild calcification in this area, stable. There is mucosal thickening in several ethmoid air cells. There is incomplete visualization of a retention cyst in the posterior right maxillary antrum. Orbits appear symmetric bilaterally. Other: Mastoid air cells are clear. IMPRESSION: 1. Apparent acute infarct with cytotoxic edema at the right posterior frontal-anterior temporal junction with involvement of portions of the anterolateral right basal ganglia, claustrum, extreme capsule, and insular cortex as well as brain parenchyma in posterior right frontal and anterior right temporal lobes. 2.  No mass or hemorrhage evident.  Underlying atrophy is stable. 3. Prior bilateral occipital lobe and left frontal lobe infarcts noted. There is periventricular small vessel disease. 4.  Foci of arterial vascular calcification noted. 5.  Multifocal paranasal sinus disease. Electronically Signed   By: Lowella Grip III M.D.   On: 07/10/2018 14:29   Ct Head Wo Contrast  Result Date: 06/18/2018 CLINICAL DATA:  Unwitnessed fall. Nasal laceration and right orbital  swelling. Initial encounter. EXAM: CT HEAD WITHOUT CONTRAST CT MAXILLOFACIAL WITHOUT CONTRAST CT CERVICAL SPINE WITHOUT CONTRAST TECHNIQUE: Multidetector CT imaging of the head, cervical spine, and maxillofacial structures were performed using the standard protocol without intravenous contrast. Multiplanar CT image reconstructions of the cervical spine and maxillofacial structures were also generated. COMPARISON:  None. FINDINGS: CT HEAD FINDINGS Brain: There is a small chronic infarct in the left frontal lobe at the anterior aspect of the operculum. Bilateral cerebral white matter hypodensities are nonspecific but compatible with mild chronic small vessel ischemic disease. There is no evidence of acute infarct,  intracranial hemorrhage, mass, midline shift, or extra-axial fluid collection. Generalized cerebral atrophy is within normal limits for age. Vascular: Calcified atherosclerosis at the skull base. No hyperdense vessel. Skull: No fracture or suspicious osseous lesion. Other: None. CT MAXILLOFACIAL FINDINGS Osseous: No displaced nasal bone fracture is identified, however motion artifact limits assessment for a nondisplaced fracture. No maxillofacial fracture is identified elsewhere. The temporomandibular joints are located. Orbits: Bilateral cataract extraction. No acute traumatic finding. Sinuses: Complete left sphenoid sinus opacification with complex, partially calcified material centrally and diffuse sclerosis of the sinus walls. Mild right maxillary sinus mucosal thickening. Clear mastoid air cells. Soft tissues: Mild nasal soft tissue swelling. CT CERVICAL SPINE FINDINGS Alignment: Cervical spine straightening. 2 mm anterolisthesis of C3 on C4, likely degenerative and facet mediated. Skull base and vertebrae: No acute fracture or suspicious osseous lesion. Mild C1-2 arthropathy. Soft tissues and spinal canal: No prevertebral fluid or swelling. No visible canal hematoma. Disc levels: Advanced disc space narrowing from C4-5 to C6-7 with degenerative endplate sclerosis and spurring. Mild to moderate left neural foraminal stenosis at C5-6 due to uncovertebral spurring. Facet arthrosis most advanced on the left at C3-4 and on the right at C7-T1. Upper chest: Ground-glass opacities with interlobular septal thickening in the lung apices. Other: 1.8 cm hypoattenuating right thyroid nodule. Aortic and carotid artery atherosclerosis. IMPRESSION: 1. No evidence of acute intracranial abnormality. 2. Mild chronic small vessel ischemic disease. 3. Chronic left frontal lobe infarct. 4. No acute maxillofacial fracture identified within limitations of motion artifact. 5. Chronic left sphenoid sinusitis. 6. No evidence of acute  fracture in the cervical spine. 7. Ground-glass opacities in the lung apices suspicious for edema. 8. 1.8 cm right thyroid nodule. This could be further evaluated with thyroid ultrasound if clinically desired. Aortic Atherosclerosis (ICD10-I70.0). Electronically Signed   By: Logan Bores M.D.   On: 06/18/2018 08:53   Ct Cervical Spine Wo Contrast  Result Date: 06/18/2018 CLINICAL DATA:  Unwitnessed fall. Nasal laceration and right orbital swelling. Initial encounter. EXAM: CT HEAD WITHOUT CONTRAST CT MAXILLOFACIAL WITHOUT CONTRAST CT CERVICAL SPINE WITHOUT CONTRAST TECHNIQUE: Multidetector CT imaging of the head, cervical spine, and maxillofacial structures were performed using the standard protocol without intravenous contrast. Multiplanar CT image reconstructions of the cervical spine and maxillofacial structures were also generated. COMPARISON:  None. FINDINGS: CT HEAD FINDINGS Brain: There is a small chronic infarct in the left frontal lobe at the anterior aspect of the operculum. Bilateral cerebral white matter hypodensities are nonspecific but compatible with mild chronic small vessel ischemic disease. There is no evidence of acute infarct, intracranial hemorrhage, mass, midline shift, or extra-axial fluid collection. Generalized cerebral atrophy is within normal limits for age. Vascular: Calcified atherosclerosis at the skull base. No hyperdense vessel. Skull: No fracture or suspicious osseous lesion. Other: None. CT MAXILLOFACIAL FINDINGS Osseous: No displaced nasal bone fracture is identified, however motion artifact limits assessment for a nondisplaced  fracture. No maxillofacial fracture is identified elsewhere. The temporomandibular joints are located. Orbits: Bilateral cataract extraction. No acute traumatic finding. Sinuses: Complete left sphenoid sinus opacification with complex, partially calcified material centrally and diffuse sclerosis of the sinus walls. Mild right maxillary sinus mucosal  thickening. Clear mastoid air cells. Soft tissues: Mild nasal soft tissue swelling. CT CERVICAL SPINE FINDINGS Alignment: Cervical spine straightening. 2 mm anterolisthesis of C3 on C4, likely degenerative and facet mediated. Skull base and vertebrae: No acute fracture or suspicious osseous lesion. Mild C1-2 arthropathy. Soft tissues and spinal canal: No prevertebral fluid or swelling. No visible canal hematoma. Disc levels: Advanced disc space narrowing from C4-5 to C6-7 with degenerative endplate sclerosis and spurring. Mild to moderate left neural foraminal stenosis at C5-6 due to uncovertebral spurring. Facet arthrosis most advanced on the left at C3-4 and on the right at C7-T1. Upper chest: Ground-glass opacities with interlobular septal thickening in the lung apices. Other: 1.8 cm hypoattenuating right thyroid nodule. Aortic and carotid artery atherosclerosis. IMPRESSION: 1. No evidence of acute intracranial abnormality. 2. Mild chronic small vessel ischemic disease. 3. Chronic left frontal lobe infarct. 4. No acute maxillofacial fracture identified within limitations of motion artifact. 5. Chronic left sphenoid sinusitis. 6. No evidence of acute fracture in the cervical spine. 7. Ground-glass opacities in the lung apices suspicious for edema. 8. 1.8 cm right thyroid nodule. This could be further evaluated with thyroid ultrasound if clinically desired. Aortic Atherosclerosis (ICD10-I70.0). Electronically Signed   By: Logan Bores M.D.   On: 06/18/2018 08:53   Mr Jodene Nam Head Wo Contrast  Result Date: 07/11/2018 CLINICAL DATA:  Right MCA stroke EXAM: MRA HEAD WITHOUT CONTRAST TECHNIQUE: Angiographic images of the Circle of Willis were obtained using MRA technique without intravenous contrast. COMPARISON:  MRI head 07/10/2018 FINDINGS: Internal carotid artery widely patent bilaterally. Anterior and middle cerebral arteries patent bilaterally without stenosis or occlusion Right vertebral artery dominant and  widely patent. Small left vertebral artery patent without stenosis PICA patent bilaterally. Basilar widely patent. Superior cerebellar and posterior cerebral arteries patent bilaterally. Mild stenosis left posterior cerebral artery. IMPRESSION: In this patient with acute right MCA infarct, the right middle cerebral arteries widely patent Mild stenosis left posterior cerebral artery. Electronically Signed   By: Franchot Gallo M.D.   On: 07/11/2018 15:19   Mr Jeri Cos And Wo Contrast  Result Date: 07/10/2018 CLINICAL DATA:  Rule out brain abscess. Left-sided facial droop and slurred speech. EXAM: MRI HEAD WITHOUT AND WITH CONTRAST TECHNIQUE: Multiplanar, multiecho pulse sequences of the brain and surrounding structures were obtained without and with intravenous contrast. CONTRAST:  7 mL Gadovist IV COMPARISON:  CT head 07/10/2018 FINDINGS: Brain: Image quality degraded by motion Acute hemorrhagic infarct in the right frontal operculum extending into the anterior insula. Acute infarct also present in the parietal operculum extending into the frontal parietal white matter. Generalized atrophy. Chronic infarct left frontal lobe. Mild chronic microvascular ischemia. Normal enhancement.  Negative for abscess or mass. Vascular: Normal arterial flow voids Skull and upper cervical spine: Negative Sinuses/Orbits: Mild mucosal edema paranasal sinuses. Bilateral cataract surgery Other: None IMPRESSION: Acute infarct right MCA territory. Hemorrhagic infarct right frontal operculum. Nonhemorrhagic infarct in the right parietal operculum. Negative for abscess. Electronically Signed   By: Franchot Gallo M.D.   On: 07/10/2018 16:14   Dg Chest Portable 1 View  Result Date: 07/10/2018 CLINICAL DATA:  84 y/o  F; fever. EXAM: PORTABLE CHEST 1 VIEW COMPARISON:  06/18/2018 chest radiograph FINDINGS: Normal cardiac  silhouette. Aortic atherosclerosis with calcification. Diffusely increased reticular opacities. No focal consolidation.  No pleural effusion or pneumothorax. No acute osseous abnormality is evident. IMPRESSION: Diffusely increased reticular opacities may represent interstitial edema or atypical pneumonia. No consolidation. Electronically Signed   By: Kristine Garbe M.D.   On: 07/10/2018 15:05   Dg Swallowing Func-speech Pathology  Result Date: 07/12/2018 Objective Swallowing Evaluation: Type of Study: MBS-Modified Barium Swallow Study  Patient Details Name: IVELIZ GARAY MRN: [id number] Date of Birth: August 14, 1926 Today's Date: 07/12/2018 Time: SLP Start Time (ACUTE ONLY): 0930 -SLP Stop Time (ACUTE ONLY): 1006 SLP Time Calculation (min) (ACUTE ONLY): 36 min Past Medical History: Past Medical History: Diagnosis Date . Anemia  . Atrial fibrillation ([place])  . Chronic kidney disease  . Diabetes mellitus without complication ([place])  . DVT (deep venous thrombosis) ([place])  . Hyperlipidemia  . Hypertension  . PE (pulmonary embolism)  Past Surgical History: Past Surgical History: Procedure Laterality Date . ABDOMINAL HYSTERECTOMY   . bladder tack   . CHOLECYSTECTOMY   . COLONOSCOPY N/A 05/03/2013  GLO:VFIEPP MOST LIKELY DUE TO ONE AVM in the ascending colon/Moderate diverticulosis hroughout the entire examined colon/Two POLYPS REMOVED/Small internal hemorrhoids . ESOPHAGOGASTRODUODENOSCOPY N/A 04/06/2013  Procedure: ESOPHAGOGASTRODUODENOSCOPY (EGD);  Surgeon: Danie Binder, MD;  [facility];  Service: Endoscopy;  Laterality: N/A;  8:45 . GIVENS CAPSULE STUDY  04/06/2013  Procedure: GIVENS CAPSULE STUDY;  Surgeon: Danie Binder, MD;  Location: [facility];  Service: Endoscopy;; HPI: pt is a 84 yo adm to [facility] - transferred from [employer] [facility].  Pt with recent fall with left wrist fx, found to have a CVA with concern for aspiraton pna.  Swallow evaluation ordered.  Pt also with shingles.  CXR concerning for pna.  Pt has been lethargic over the last few days.  .   Subjective: pt awake in chair Assessment / Plan / Recommendation  CHL IP CLINICAL IMPRESSIONS 07/12/2018 Clinical Impression Pt presents with mild oral and severe pharyngeal dysphagia.   Largest deficit is her delay in reflexive swallow and decreased laryngeal closure allowing aspiration/penetration without adequate sensation nor ability to clear.  Pt did cough significantly with gross nectar aspiration but cough did not clear.  Aspiration of thin, nectar, penetration of honey, puree noted without pt awareness if mild.  Pharyngeal swallow is overall strong with mild residuals but delayed as boluses pool in distal pharynx.  She is aspiration risk regardless of consistency due to her level of delay.  Strategies (chin tuck, cervical extension, cued dry swallow) did not prevent her from laryngeal penetration/aspiration.  If pt to consume diet it should be with accepted aspiration and ongoing aspiration pna risk.  Chin tuck posture with nectar via straw *SMALL boluses* and puree may be most comforting for pt.   SLP Visit Diagnosis Dysphagia, oropharyngeal phase (R13.12) Attention and concentration deficit following -- Frontal lobe and executive function deficit following -- Impact on safety and function Severe aspiration risk;Risk for inadequate nutrition/hydration   CHL IP TREATMENT RECOMMENDATION 07/12/2018 Treatment Recommendations Therapy as outlined in treatment plan below   Prognosis 07/12/2018 Prognosis for Safe Diet Advancement Fair Barriers to Reach Goals Severity of deficits Barriers/Prognosis Comment -- CHL IP DIET RECOMMENDATION 07/12/2018 SLP Diet Recommendations NPO;Nectar thick liquid;Dysphagia 1 (Puree) solids Liquid Administration via Straw Medication Administration Via alternative means Compensations Small sips/bites;Slow rate;Follow solids with liquid;Chin tuck;Other (Comment) Postural Changes Seated upright at 90 degrees;Remain semi-upright after after feeds/meals (Comment)   CHL IP OTHER RECOMMENDATIONS 07/12/2018 Recommended  Consults -- Oral Care Recommendations Oral  care QID Other Recommendations --   CHL IP FOLLOW UP RECOMMENDATIONS 07/12/2018 Follow up Recommendations Other (comment)   CHL IP FREQUENCY AND DURATION 07/12/2018 Speech Therapy Frequency (ACUTE ONLY) min 1 x/week Treatment Duration 2 weeks      CHL IP ORAL PHASE 07/12/2018 Oral Phase Impaired Oral - Pudding Teaspoon -- Oral - Pudding Cup -- Oral - Honey Teaspoon Reduced posterior propulsion;Decreased bolus cohesion;Weak lingual manipulation Oral - Honey Cup -- Oral - Nectar Teaspoon Reduced posterior propulsion;Decreased bolus cohesion;Weak lingual manipulation Oral - Nectar Cup Reduced posterior propulsion;Decreased bolus cohesion;Weak lingual manipulation Oral - Nectar Straw Reduced posterior propulsion;Decreased bolus cohesion;Weak lingual manipulation Oral - Thin Teaspoon -- Oral - Thin Cup Reduced posterior propulsion;Decreased bolus cohesion;Weak lingual manipulation Oral - Thin Straw Reduced posterior propulsion;Decreased bolus cohesion;Weak lingual manipulation Oral - Puree Reduced posterior propulsion;Decreased bolus cohesion;Weak lingual manipulation Oral - Mech Soft Reduced posterior propulsion;Decreased bolus cohesion;Weak lingual manipulation;Impaired mastication Oral - Regular -- Oral - Multi-Consistency -- Oral - Pill -- Oral Phase - Comment --  CHL IP PHARYNGEAL PHASE 07/12/2018 Pharyngeal Phase Impaired Pharyngeal- Pudding Teaspoon -- Pharyngeal -- Pharyngeal- Pudding Cup -- Pharyngeal -- Pharyngeal- Honey Teaspoon Delayed swallow initiation-vallecula;Reduced pharyngeal peristalsis;Reduced epiglottic inversion;Penetration/Aspiration before swallow;Reduced laryngeal elevation;Reduced airway/laryngeal closure;Delayed swallow initiation-pyriform sinuses Pharyngeal Material enters airway, remains ABOVE vocal cords and not ejected out;Material enters airway, passes BELOW cords and not ejected out despite cough attempt by patient Pharyngeal- Honey Cup -- Pharyngeal -- Pharyngeal- Nectar Teaspoon Delayed  swallow initiation-pyriform sinuses;Reduced pharyngeal peristalsis;Reduced epiglottic inversion;Reduced laryngeal elevation;Reduced airway/laryngeal closure;Penetration/Aspiration before swallow;Pharyngeal residue - valleculae Pharyngeal -- Pharyngeal- Nectar Cup Delayed swallow initiation-pyriform sinuses;Reduced pharyngeal peristalsis;Penetration/Aspiration before swallow;Reduced airway/laryngeal closure;Reduced laryngeal elevation;Reduced tongue base retraction;Moderate aspiration Pharyngeal Material enters airway, CONTACTS cords and not ejected out;Material enters airway, passes BELOW cords and not ejected out despite cough attempt by patient Pharyngeal- Nectar Straw Delayed swallow initiation-pyriform sinuses;Reduced tongue base retraction;Penetration/Aspiration during swallow;Penetration/Aspiration before swallow;Reduced pharyngeal peristalsis;Reduced laryngeal elevation;Reduced epiglottic inversion;Reduced airway/laryngeal closure Pharyngeal Material enters airway, CONTACTS cords and then ejected out Pharyngeal- Thin Teaspoon Reduced tongue base retraction;Reduced airway/laryngeal closure;Reduced anterior laryngeal mobility;Reduced laryngeal elevation;Delayed swallow initiation-vallecula Pharyngeal Material enters airway, passes BELOW cords without attempt by patient to eject out (silent aspiration) Pharyngeal- Thin Cup Delayed swallow initiation-pyriform sinuses;Reduced epiglottic inversion;Reduced laryngeal elevation;Reduced airway/laryngeal closure;Reduced pharyngeal peristalsis;Penetration/Aspiration before swallow;Moderate aspiration Pharyngeal Material enters airway, passes BELOW cords without attempt by patient to eject out (silent aspiration) Pharyngeal- Thin Straw Delayed swallow initiation-pyriform sinuses;Reduced pharyngeal peristalsis;Reduced airway/laryngeal closure;Reduced laryngeal elevation Pharyngeal -- Pharyngeal- Puree Delayed swallow initiation-pyriform sinuses;Reduced pharyngeal  peristalsis;Pharyngeal residue - valleculae;Reduced airway/laryngeal closure;Reduced tongue base retraction Pharyngeal -- Pharyngeal- Mechanical Soft Delayed swallow initiation-pyriform sinuses;Reduced pharyngeal peristalsis;Reduced laryngeal elevation;Reduced airway/laryngeal closure;Reduced epiglottic inversion;Reduced tongue base retraction Pharyngeal -- Pharyngeal- Regular -- Pharyngeal -- Pharyngeal- Multi-consistency -- Pharyngeal -- Pharyngeal- Pill -- Pharyngeal -- Pharyngeal Comment --  CHL IP CERVICAL ESOPHAGEAL PHASE 07/12/2018 Cervical Esophageal Phase Impaired Pudding Teaspoon -- Pudding Cup -- Honey Teaspoon -- Honey Cup -- Nectar Teaspoon -- Nectar Cup -- Nectar Straw -- Thin Teaspoon -- Thin Cup -- Thin Straw -- Puree -- Mechanical Soft -- Regular -- Multi-consistency -- Pill -- Cervical Esophageal Comment appearance of prominent cp Luanna Salk, MS [facility] SLP Acute Rehab Services Pager [phone] Office [phone] Macario Golds 07/12/2018, 2:13 PM              Dg Hip Unilat With Pelvis 2-3 Views Left  Result Date: 06/18/2018 CLINICAL DATA:  Left hip pain due  to a fall this morning. Initial encounter. EXAM: DG HIP (WITH OR WITHOUT PELVIS) 2-3V LEFT COMPARISON:  None. FINDINGS: There is no acute bony or joint abnormality. Mild degenerative change is present about the hips. IMPRESSION: No acute abnormality. Electronically Signed   By: Inge Rise M.D.   On: 06/18/2018 08:38   Vas US Carotid  Result Date: 07/13/2018 Carotid Arterial Duplex Study Indications:       CVA, Speech disturbance and facial droop, gait disturbance. Risk Factors:      Hypertension, hyperlipidemia, Diabetes. Other Factors:     Atrial fibrillation. Comparison Study:  No prior study on file Performing Technologist: Sharion Dove RVS  Examination Guidelines: A complete evaluation includes B-mode imaging, spectral Doppler, color Doppler, and power Doppler as needed of all accessible portions of each vessel.  Bilateral testing is considered an integral part of a complete examination. Limited examinations for reoccurring indications may be performed as noted.  Right Carotid Findings: +----------+--------+--------+--------+------------+------------------+           PSV cm/sEDV cm/sStenosisDescribe    Comments           +----------+--------+--------+--------+------------+------------------+ CCA Prox  71      8                           intimal thickening +----------+--------+--------+--------+------------+------------------+ CCA Distal57      12                          intimal thickening +----------+--------+--------+--------+------------+------------------+ ICA Prox  63      12              heterogenous                   +----------+--------+--------+--------+------------+------------------+ ICA Distal77      15                                             +----------+--------+--------+--------+------------+------------------+ ECA       66      4                                              +----------+--------+--------+--------+------------+------------------+ +----------+--------+-------+--------+-------------------+           PSV cm/sEDV cmsDescribeArm Pressure (mmHG) +----------+--------+-------+--------+-------------------+ [id number]                                        +----------+--------+-------+--------+-------------------+ +---------+--------+--+--------+-+ VertebralPSV cm/s42EDV cm/s9 +---------+--------+--+--------+-+  Left Carotid Findings: +----------+--------+--------+--------+------------+------------------+           PSV cm/sEDV cm/sStenosisDescribe    Comments           +----------+--------+--------+--------+------------+------------------+ CCA Prox  72      7                           intimal thickening +----------+--------+--------+--------+------------+------------------+ CCA Distal64      9                            intimal thickening +----------+--------+--------+--------+------------+------------------+ ICA Prox  46      13              heterogenousShadowing          +----------+--------+--------+--------+------------+------------------+ ICA Distal66      17                                             +----------+--------+--------+--------+------------+------------------+ ECA       66      2                                              +----------+--------+--------+--------+------------+------------------+ +---------+--------+--+--------+-+ VertebralPSV cm/s44EDV cm/s7 +---------+--------+--+--------+-+  Summary: Right Carotid: Velocities in the right ICA are consistent with a 1-39% stenosis. Left Carotid: Velocities in the left ICA are consistent with a 1-39% stenosis. Vertebrals:  Bilateral vertebral arteries demonstrate antegrade flow. Subclavians: Normal flow hemodynamics were seen in bilateral subclavian              arteries. *See table(s) above for measurements and observations.  Electronically signed by Antony Contras MD on 07/13/2018 at 8:17:16 AM.    Final    Ct Maxillofacial Wo Contrast  Result Date: 06/18/2018 CLINICAL DATA:  Unwitnessed fall. Nasal laceration and right orbital swelling. Initial encounter. EXAM: CT HEAD WITHOUT CONTRAST CT MAXILLOFACIAL WITHOUT CONTRAST CT CERVICAL SPINE WITHOUT CONTRAST TECHNIQUE: Multidetector CT imaging of the head, cervical spine, and maxillofacial structures were performed using the standard protocol without intravenous contrast. Multiplanar CT image reconstructions of the cervical spine and maxillofacial structures were also generated. COMPARISON:  None. FINDINGS: CT HEAD FINDINGS Brain: There is a small chronic infarct in the left frontal lobe at the anterior aspect of the operculum. Bilateral cerebral white matter hypodensities are nonspecific but compatible with mild chronic small vessel ischemic disease. There is no evidence of acute  infarct, intracranial hemorrhage, mass, midline shift, or extra-axial fluid collection. Generalized cerebral atrophy is within normal limits for age. Vascular: Calcified atherosclerosis at the skull base. No hyperdense vessel. Skull: No fracture or suspicious osseous lesion. Other: None. CT MAXILLOFACIAL FINDINGS Osseous: No displaced nasal bone fracture is identified, however motion artifact limits assessment for a nondisplaced fracture. No maxillofacial fracture is identified elsewhere. The temporomandibular joints are located. Orbits: Bilateral cataract extraction. No acute traumatic finding. Sinuses: Complete left sphenoid sinus opacification with complex, partially calcified material centrally and diffuse sclerosis of the sinus walls. Mild right maxillary sinus mucosal thickening. Clear mastoid air cells. Soft tissues: Mild nasal soft tissue swelling. CT CERVICAL SPINE FINDINGS Alignment: Cervical spine straightening. 2 mm anterolisthesis of C3 on C4, likely degenerative and facet mediated. Skull base and vertebrae: No acute fracture or suspicious osseous lesion. Mild C1-2 arthropathy. Soft tissues and spinal canal: No prevertebral fluid or swelling. No visible canal hematoma. Disc levels: Advanced disc space narrowing from C4-5 to C6-7 with degenerative endplate sclerosis and spurring. Mild to moderate left neural foraminal stenosis at C5-6 due to uncovertebral spurring. Facet arthrosis most advanced on the left at C3-4 and on the right at C7-T1. Upper chest: Ground-glass opacities with interlobular septal thickening in the lung apices. Other: 1.8 cm hypoattenuating right thyroid nodule. Aortic and carotid artery atherosclerosis. IMPRESSION: 1. No evidence of acute intracranial abnormality. 2. Mild chronic small vessel ischemic disease. 3. Chronic left  frontal lobe infarct. 4. No acute maxillofacial fracture identified within limitations of motion artifact. 5. Chronic left sphenoid sinusitis. 6. No evidence of  acute fracture in the cervical spine. 7. Ground-glass opacities in the lung apices suspicious for edema. 8. 1.8 cm right thyroid nodule. This could be further evaluated with thyroid ultrasound if clinically desired. Aortic Atherosclerosis (ICD10-I70.0). Electronically Signed   By: Logan Bores M.D.   On: 06/18/2018 08:53       LAB RESULTS: Basic Metabolic Panel: Recent Labs  Lab 07/13/18 0630 07/15/18 0355  NA 141 142  K 3.7 3.6  CL 117* 117*  CO2 19* 19*  GLUCOSE 139* 383*  BUN 22 31*  CREATININE 1.41* 1.58*  CALCIUM 9.1 9.6   Liver Function Tests: Recent Labs  Lab 07/10/18 1352  AST 25  ALT 15  ALKPHOS 139*  BILITOT 1.5*  PROT 7.7  ALBUMIN 3.4*   Recent Labs  Lab 07/10/18 1515  LIPASE 32   No results for input(s): AMMONIA in the last 168 hours. CBC: Recent Labs  Lab 07/10/18 1352  07/13/18 0630 07/15/18 0355  WBC 11.2*   < > 8.4 13.1*  NEUTROABS 9.4*  --   --   --   HGB 10.9*   < > 10.2* 11.1*  HCT 34.6*   < > 33.9* 36.7  MCV 86.1   < > 88.5 90.2  PLT 340   < > 226 276   < > = values in this interval not displayed.   Cardiac Enzymes: No results for input(s): CKTOTAL, CKMB, CKMBINDEX, TROPONINI in the last 168 hours. BNP: Invalid input(s): POCBNP CBG: Recent Labs  Lab 07/14/18 2148 07/15/18 0606  GLUCAP 304* 296*      Disposition and Follow-up: Discharge Instructions    Increase activity slowly   Complete by:  As directed        DISPOSITION: ALF with hospice to follow, PT OT, speech   DISCHARGE FOLLOW-UP  Contact information for follow-up providers    Vidal Schwalbe, MD. Schedule an appointment as soon as possible for a visit in 2 week(s).   Specialty:  Family Medicine Contact information: [address] [place] [address] [phone]            Contact information for after-discharge care    Destination    [facility] Preferred SNF .   Service:  Skilled Nursing Contact information: [address] [place] [phone]                   Time coordinating discharge:  35 minutes  Signed:   Estill Cotta M.D. [facility] 07/15/2018, 10:47 AM Pager: [phone]

## 2018-07-15 NOTE — Progress Notes (Signed)
Physical Therapy Treatment Patient Details Name: Julie Carpenter MRN: [id number] DOB: 1926/07/08 Today's Date: 07/15/2018    History of Present Illness Pt is a 84 y/o female admitted from her ALF secondary to confusion. MRI revealed an acute R MCA infarct. PMH including but not limited to a-fib, DM and HTN.    PT Comments    Pt was in bed with family member present upon arrival. Pt was disoriented to situation. Pt reported to therapist that she needed to use the bathroom, so therapist transferred her to [facility]. Pt required Mod A + 2 for safe transfer. Pt took 3-4 steps before sitting on BSC and required VC/TC for safety with AD and maintaining upright posture. Nursing was notified of monitor reporting [facility] during transfer to [facility]  Pt had huge BM. Therapist used Stedy to assist pt in standing from BCS to perform pericare. Pt was transferred to chair using Stedy due to unsteadiness with previous transfer. Pt was limited this session due to fatigue and BM. Pt would benefit from continued skilled PT in order to further progress toward stated goals. Pt still seems appropriate for SNF placement for d/c based on current functional status.   Follow Up Recommendations  SNF     Equipment Recommendations  None recommended by PT    Recommendations for Other Services       Precautions / Restrictions Precautions Precautions: Fall Precaution Comments: break down on L buttock noted Restrictions Weight Bearing Restrictions: Yes LUE Weight Bearing: Non weight bearing Other Position/Activity Restrictions: per family - pt with a recent L wrist fx ~2 weeks ago, assumed NWB    Mobility  Bed Mobility Overal bed mobility: Needs Assistance Bed Mobility: Supine to Sit     Supine to sit: +2 for physical assistance;HOB elevated;Max assist     General bed mobility comments: pt  requires assist with progressing LE off EOB. Use of bed pad to position hips when on EOB   Transfers Overall transfer level:  Needs assistance Equipment used: Left platform walker(Sara Stedy) Transfers: Sit to/from Stand Sit to Stand: From elevated surface;+2 safety/equipment;+2 physical assistance;Max assist         General transfer comment:  Pt required Max A to power up into standing and transfer to [facility] with platform walker. Pt's hips in flexion duing transfer, required assist maintain upright posture. To transfer to chair at end of session Stedy was used due to unsteadiness with PF walker.   Ambulation/Gait Ambulation/Gait assistance: +2 safety/equipment;+2 physical assistance;Total assist Gait Distance (Feet): (limited to 3-4 steps) Assistive device: Left platform walker       General Gait Details: Pt 3-4 steps with platform walker from bed to [facility] requiring Total A +2 for safety and to prevent LOB. Pt presented with hip flexion and poor foot clearance required BSC to be brought behind her as she was unable to pivot to commode.   Stairs             Wheelchair Mobility    Modified Rankin (Stroke Patients Only) Modified Rankin (Stroke Patients Only) Pre-Morbid Rankin Score: Moderately severe disability Modified Rankin: Severe disability     Balance Overall balance assessment: Needs assistance Sitting-balance support: Feet supported;Single extremity supported Sitting balance-Leahy Scale: Fair Sitting balance - Comments: Pt able to sit on EOB with Min guard for periods.  Postural control: Left lateral lean Standing balance support: Single extremity supported Standing balance-Leahy Scale: Poor Standing balance comment: Therapist used Platform walker this session due to NWB on LUE.  Cognition Arousal/Alertness: Awake/alert Behavior During Therapy: WFL for tasks assessed/performed Overall Cognitive Status: Impaired/Different from baseline Area of Impairment: Following commands;Safety/judgement;Problem solving;Orientation                  Orientation Level: Disoriented to;Situation     Following Commands: Follows one step commands with increased time Safety/Judgement: Decreased awareness of deficits;Decreased awareness of safety   Problem Solving: Slow processing;Difficulty sequencing;Requires verbal cues General Comments: Pt was limited this session due to fatigue and large BM      Exercises      General Comments        Pertinent Vitals/Pain Pain Assessment: Faces Pain Score: 0-No pain Faces Pain Scale: No hurt    Home Living                      Prior Function            PT Goals (current goals can now be found in the care plan section) Acute Rehab PT Goals Patient Stated Goal: to get better PT Goal Formulation: With patient/family Time For Goal Achievement: 07/25/18 Potential to Achieve Goals: Good Progress towards PT goals: Progressing toward goals    Frequency    Min 3X/week      PT Plan Current plan remains appropriate    Co-evaluation              AM-PAC PT "6 Clicks" Daily Activity  Outcome Measure  Difficulty turning over in bed (including adjusting bedclothes, sheets and blankets)?: A Lot Difficulty moving from lying on back to sitting on the side of the bed? : A Lot Difficulty sitting down on and standing up from a chair with arms (e.g., wheelchair, bedside commode, etc,.)?: A Lot Help needed moving to and from a bed to chair (including a wheelchair)?: A Lot Help needed walking in hospital room?: A Lot Help needed climbing 3-5 steps with a railing? : Total 6 Click Score: 11    End of Session Equipment Utilized During Treatment: Gait belt Activity Tolerance: Patient limited by fatigue Patient left: in chair;with call bell/phone within reach;with chair alarm set Nurse Communication: Mobility status PT Visit Diagnosis: Other abnormalities of gait and mobility (R26.89)     Time: 8144-8185 PT Time Calculation (min) (ACUTE ONLY): 30 min  Charges:   $Therapeutic Activity: 23-37 mins                     [place], SPTA   [place] 07/15/2018, 1:47 PM

## 2018-07-15 NOTE — Progress Notes (Signed)
SLP Cancellation Note  Patient Details Name: MADILYNE TADLOCK MRN: [id number] DOB: [birth year]   Cancelled treatment:        Cancelled, Pt is transitioning back to [facility] and comfort care with a diet of Dys 1, nectar thickened liquids understanding the risk of aspiration. Speech therapy signing off.    Charlynne Cousins Sueko Dimichele, MA, CCC-SLP 07/15/2018 1:34 PM

## 2018-07-15 NOTE — Progress Notes (Signed)
CM consulted for Eliquis coverage for the patient. She has Medicaid and Eliquis is on the preferred list for Medicaid. Her co pays should be less than $4. CM following for further d/c needs.

## 2018-07-17 ENCOUNTER — Ambulatory Visit (INDEPENDENT_AMBULATORY_CARE_PROVIDER_SITE_OTHER): Payer: Medicare Other | Admitting: Orthopaedic Surgery

## 2018-07-24 ENCOUNTER — Ambulatory Visit (INDEPENDENT_AMBULATORY_CARE_PROVIDER_SITE_OTHER): Payer: Medicare Other | Admitting: Orthopaedic Surgery

## 2018-07-27 ENCOUNTER — Other Ambulatory Visit: Payer: Self-pay

## 2018-08-09 DEATH — deceased

## 2020-05-04 IMAGING — DX DG CHEST 1V
1 series · 1 of 1 positions shown · non-contrast
Comparison: Two-view chest x-ray 08/22/2015

CLINICAL DATA: Unwitnessed falls today.

EXAM:
CHEST  1 VIEW

[chest ap]
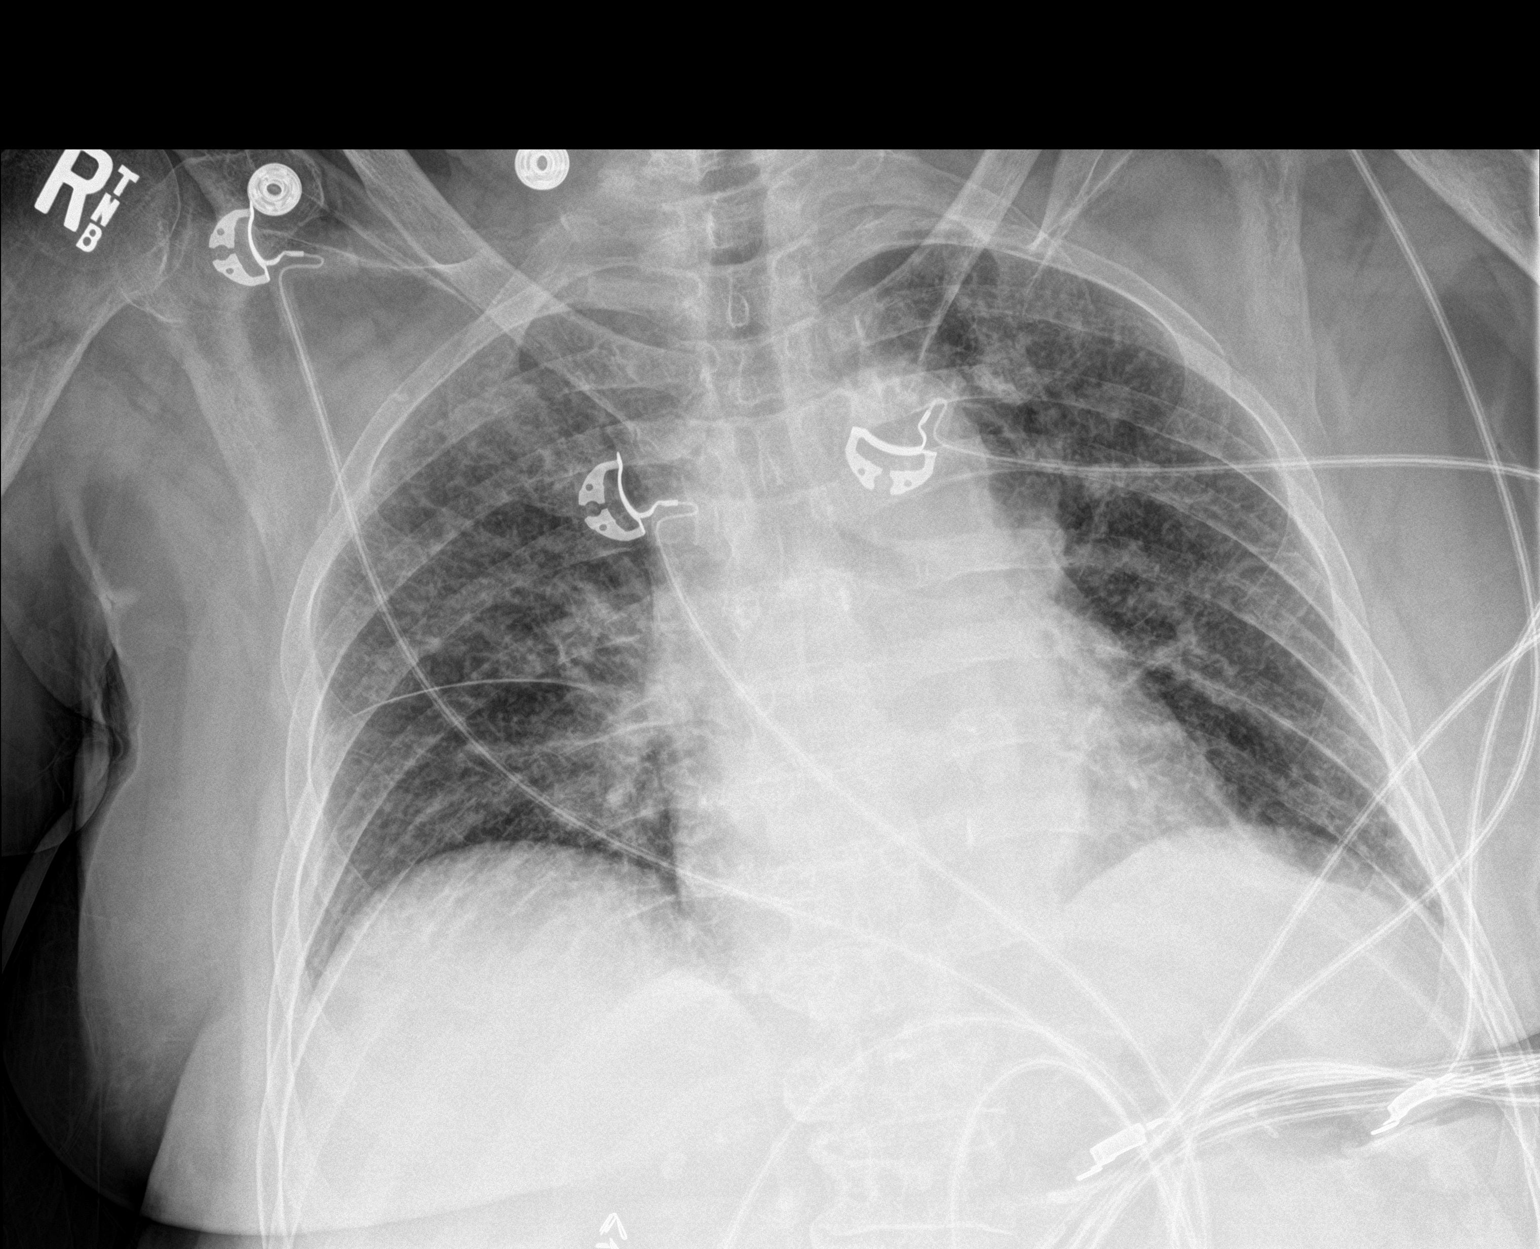

[1 of 1 positions shown; findings below may reference images not displayed]

FINDINGS: The heart size is exaggerate by low lung volumes. There is mild
increase in a diffuse interstitial pattern. Atherosclerotic
calcifications are present at the aorta. No focal airspace disease
is present. No significant effusions are present.

Visualized bony thorax is stable. No acute fractures are present.
There is no pneumothorax.
IMPRESSION: 1. Low lung volumes.
2. Increased interstitial pattern.  Question mild edema.
3. No acute trauma.
4. Aortic atherosclerosis.

## 2020-05-04 IMAGING — DX DG HIP (WITH OR WITHOUT PELVIS) 2-3V*L*
3 series · 3 of 3 positions shown · non-contrast
Comparison: None.

CLINICAL DATA: Left hip pain due to a fall this morning. Initial
encounter.

EXAM:
DG HIP (WITH OR WITHOUT PELVIS) 2-3V LEFT

[pelvis ap]
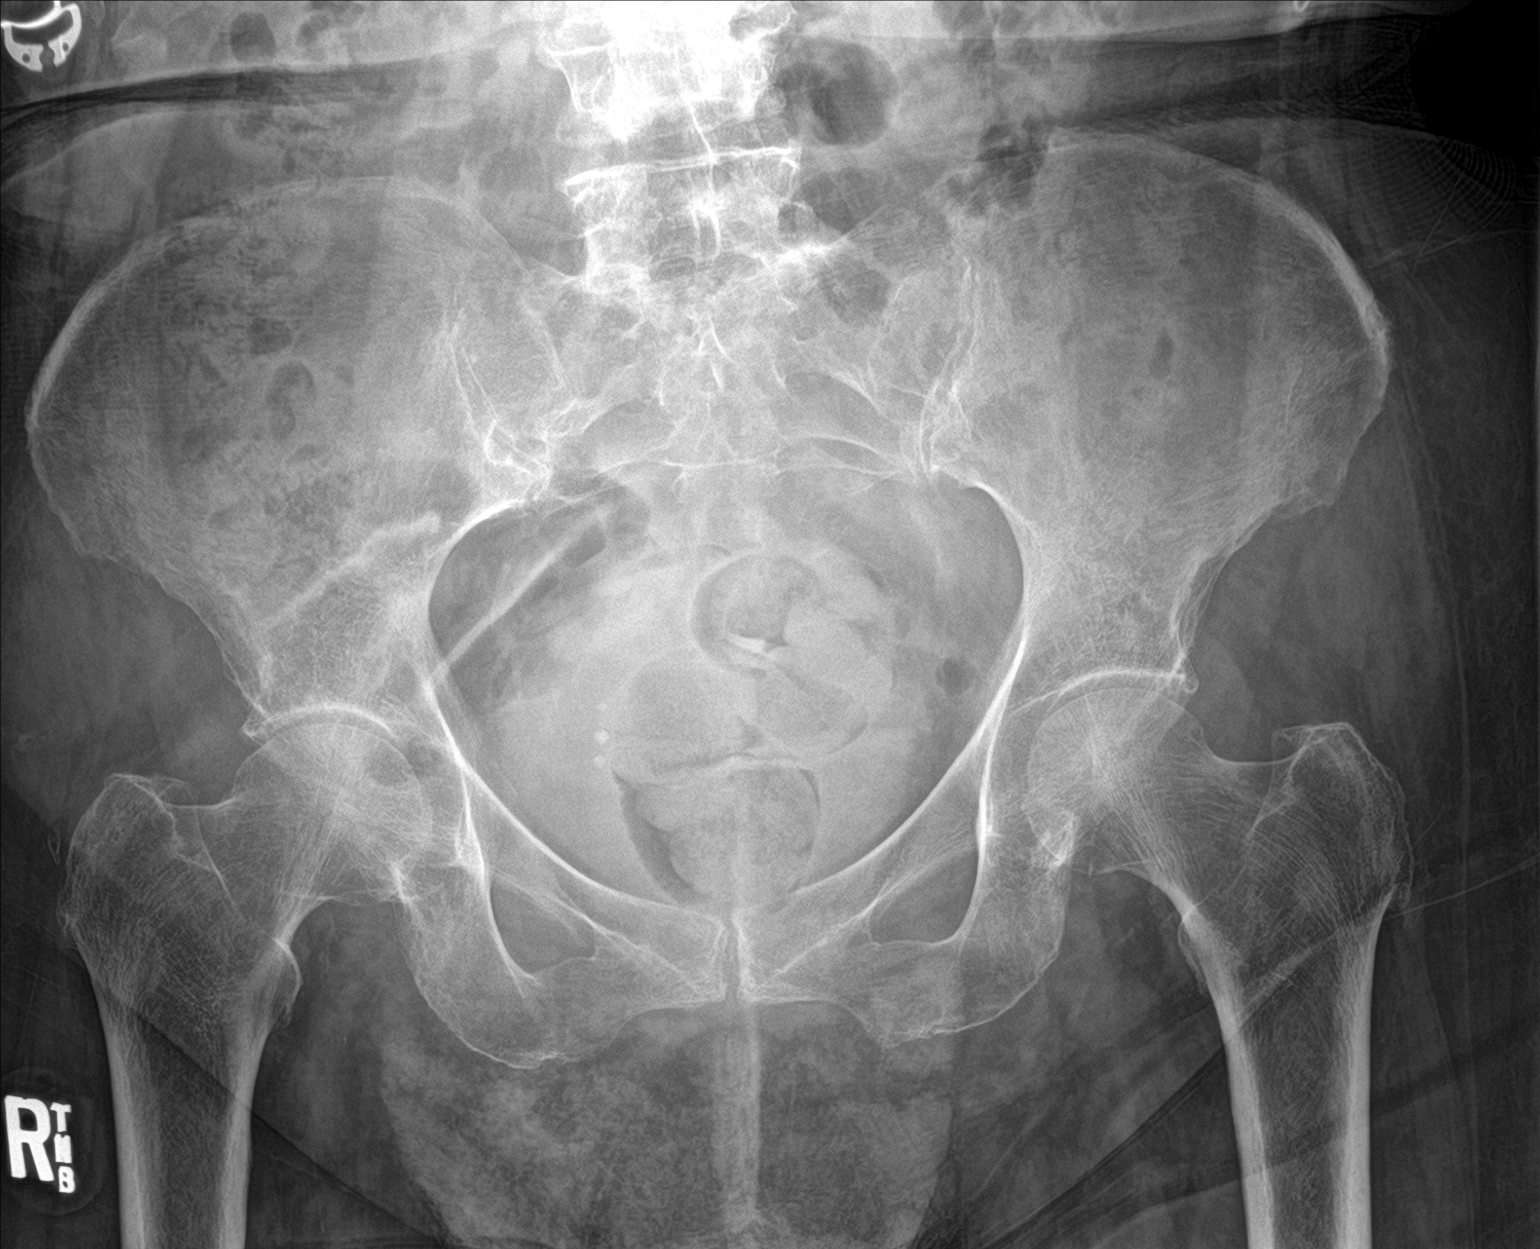

[hip ap]
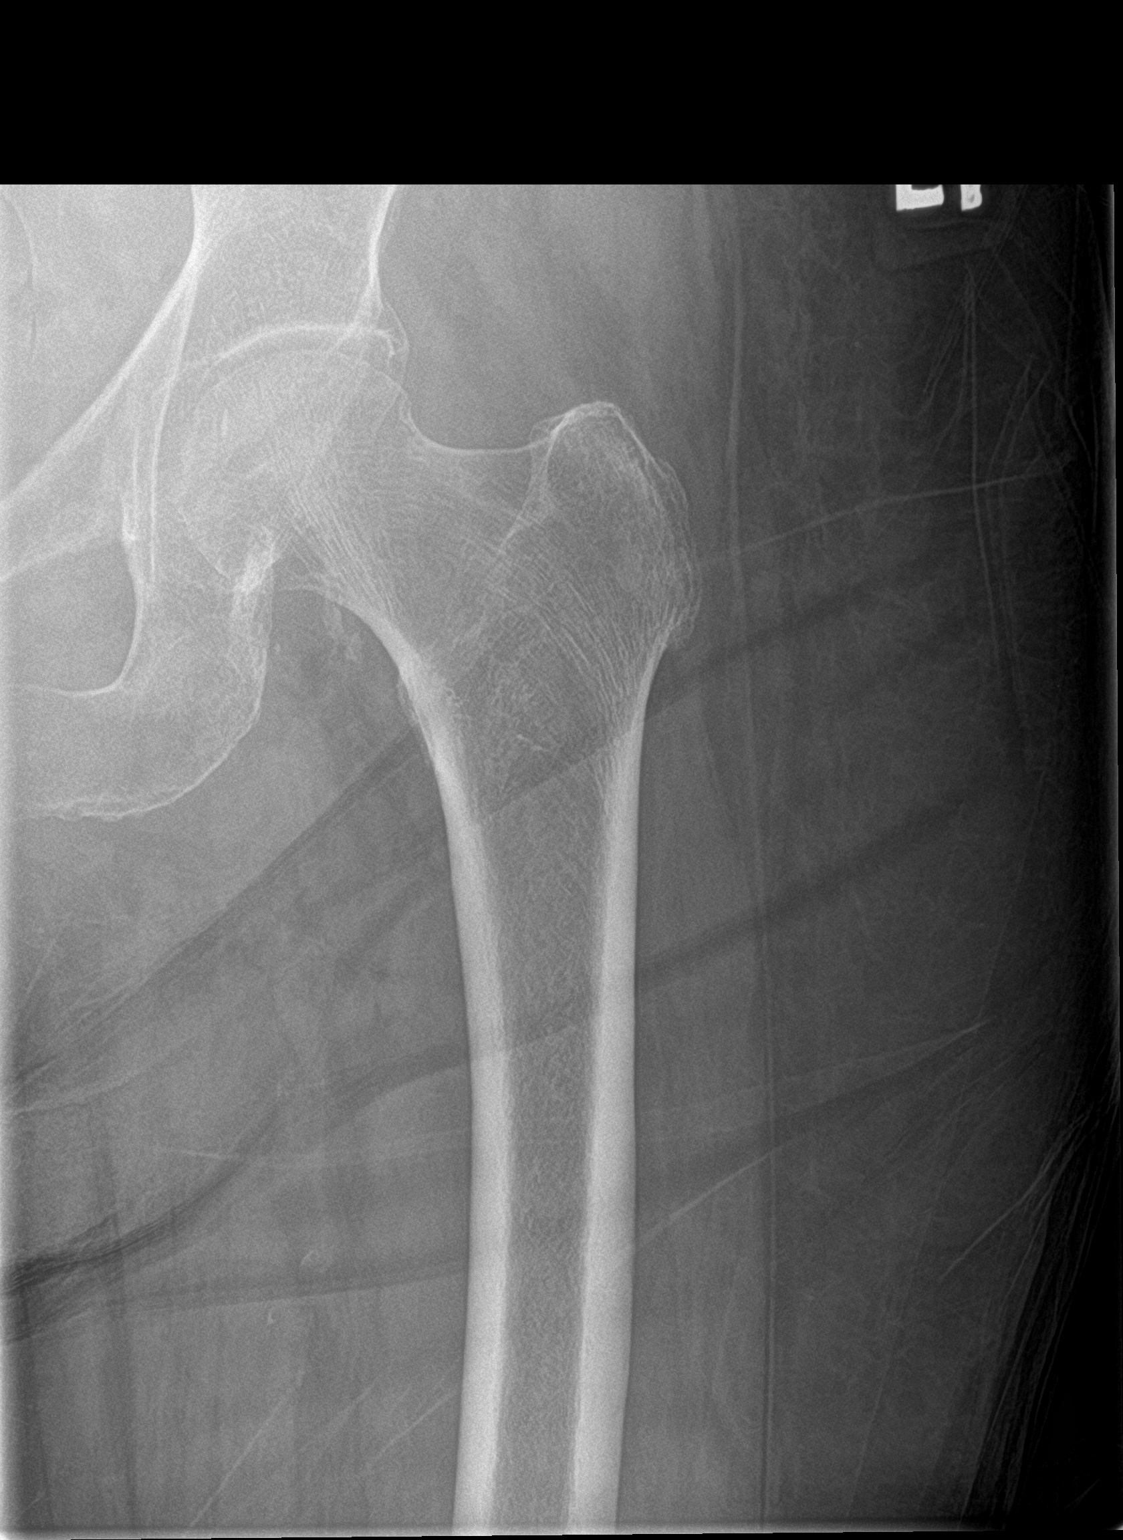

[hip lat]
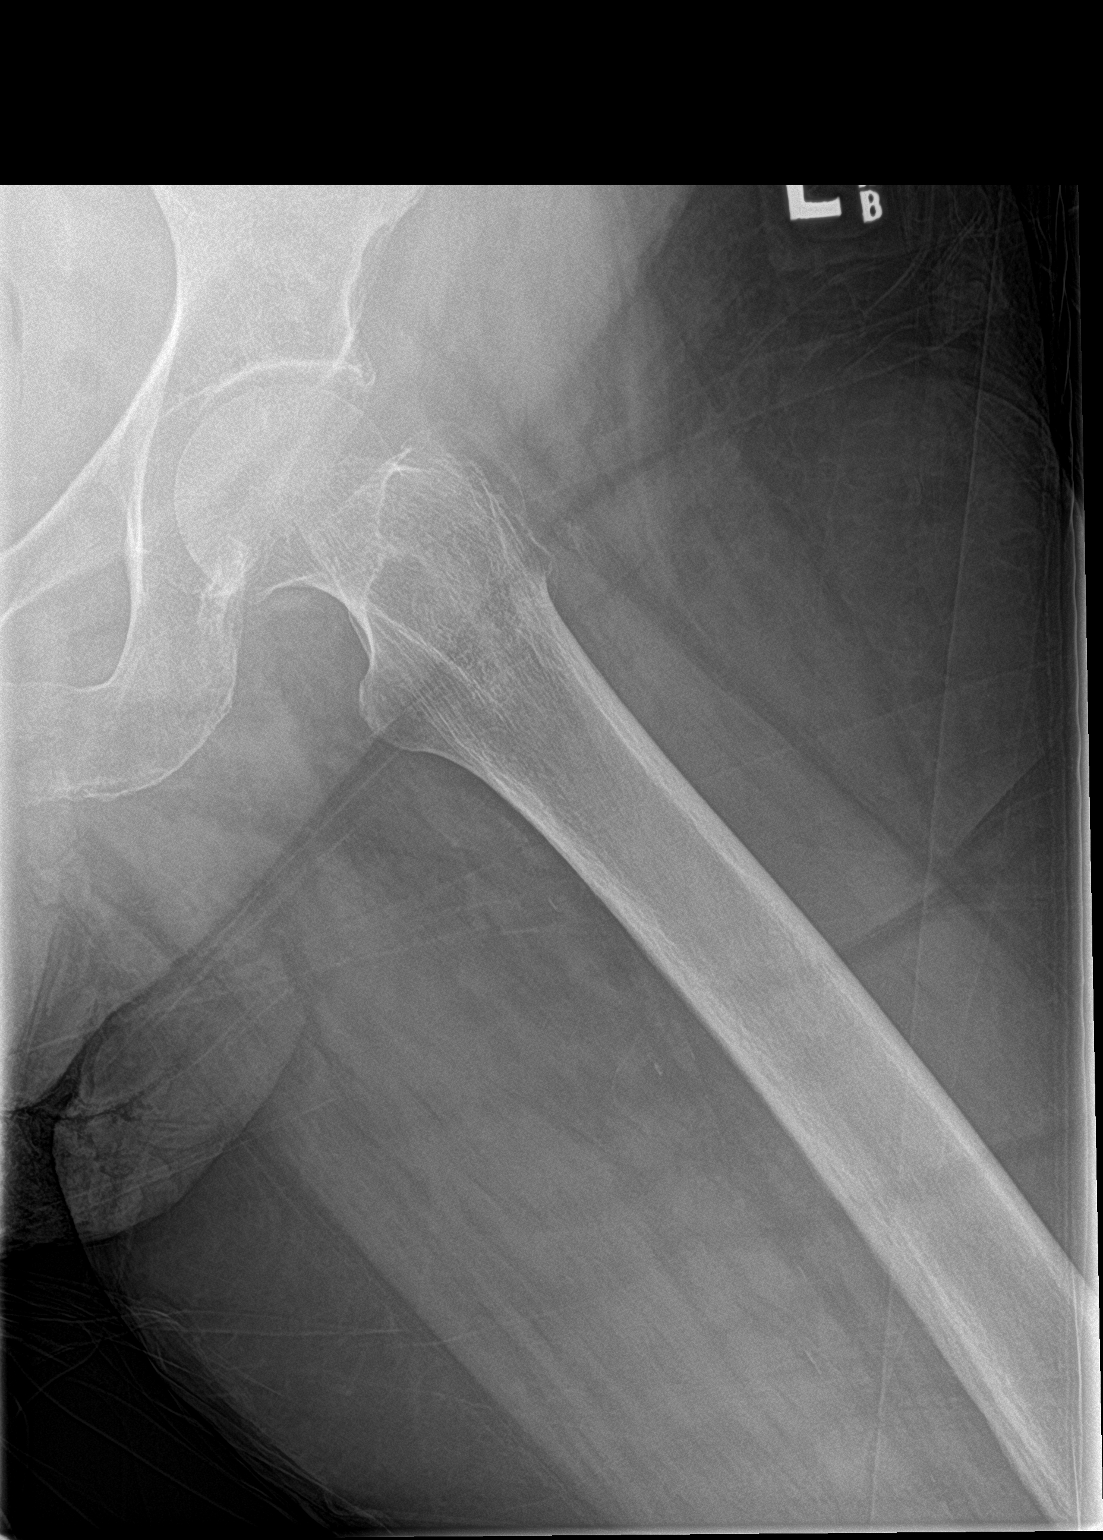

[3 of 3 positions shown; findings below may reference images not displayed]

FINDINGS: There is no acute bony or joint abnormality. Mild degenerative
change is present about the hips.
IMPRESSION: No acute abnormality.
